# Patient Record
Sex: Male | Born: 1975 | Race: White | Hispanic: No | Marital: Single
Health system: Southern US, Academic
[De-identification: ages and names within clinical notes are randomized; demographics above are authoritative.]

## PROBLEM LIST (undated history)

## (undated) ENCOUNTER — Encounter: Attending: Pharmacist | Primary: Pharmacist

## (undated) ENCOUNTER — Encounter: Attending: Cardiovascular Disease | Primary: Cardiovascular Disease

## (undated) ENCOUNTER — Ambulatory Visit

## (undated) ENCOUNTER — Encounter: Attending: Hematology & Oncology | Primary: Hematology & Oncology

## (undated) ENCOUNTER — Telehealth

## (undated) ENCOUNTER — Ambulatory Visit: Payer: PRIVATE HEALTH INSURANCE | Attending: Cardiovascular Disease | Primary: Cardiovascular Disease

## (undated) ENCOUNTER — Encounter

## (undated) ENCOUNTER — Encounter: Payer: PRIVATE HEALTH INSURANCE | Attending: Cardiovascular Disease | Primary: Cardiovascular Disease

## (undated) DIAGNOSIS — F419 Anxiety disorder, unspecified: Secondary | ICD-10-CM

## (undated) DIAGNOSIS — E785 Hyperlipidemia, unspecified: Secondary | ICD-10-CM

## (undated) DIAGNOSIS — Z8673 Personal history of transient ischemic attack (TIA), and cerebral infarction without residual deficits: Secondary | ICD-10-CM

## (undated) DIAGNOSIS — I639 Cerebral infarction, unspecified: Secondary | ICD-10-CM

## (undated) HISTORY — DX: Personal history of transient ischemic attack (TIA), and cerebral infarction without residual deficits: Z86.73

---

## 2006-11-08 ENCOUNTER — Emergency Department (HOSPITAL_COMMUNITY): Admission: EM | Admit: 2006-11-08 | Discharge: 2006-11-08 | Payer: Self-pay | Admitting: Family Medicine

## 2008-11-02 ENCOUNTER — Emergency Department (HOSPITAL_COMMUNITY): Admission: EM | Admit: 2008-11-02 | Discharge: 2008-11-02 | Payer: Self-pay | Admitting: Family Medicine

## 2010-01-01 ENCOUNTER — Emergency Department (HOSPITAL_COMMUNITY): Admission: EM | Admit: 2010-01-01 | Discharge: 2010-01-01 | Payer: Self-pay | Admitting: Family Medicine

## 2012-02-26 ENCOUNTER — Encounter (HOSPITAL_COMMUNITY): Payer: Self-pay | Admitting: *Deleted

## 2012-02-26 ENCOUNTER — Emergency Department (INDEPENDENT_AMBULATORY_CARE_PROVIDER_SITE_OTHER)
Admission: EM | Admit: 2012-02-26 | Discharge: 2012-02-26 | Disposition: A | Discharge status: Home / Self Care | Payer: BC Managed Care – PPO | Source: Home / Self Care

## 2012-02-26 DIAGNOSIS — R0789 Other chest pain: Secondary | ICD-10-CM

## 2012-02-26 DIAGNOSIS — R071 Chest pain on breathing: Secondary | ICD-10-CM

## 2012-02-26 MED ORDER — NAPROXEN 500 MG PO TABS: 500.0000 mg | ORAL_TABLET | Freq: Two times a day (BID) | ORAL | Status: DC

## 2012-02-26 NOTE — ED Provider Notes (Signed)
Medical screening examination/treatment/procedure(s) were performed by non-physician practitioner and as supervising physician I was immediately available for consultation/collaboration.  Tallie Hevia, M.D.   Angenette Daily C Sharine Cadle, MD 02/26/12 2223 

## 2012-02-26 NOTE — ED Notes (Signed)
REPORTED  OFF  TO  Rosalita Chessman

## 2012-02-26 NOTE — ED Notes (Signed)
Speaking  In  Complete  sentances   Pulse  Ox  100       Pulse  Is  84

## 2012-02-26 NOTE — ED Notes (Signed)
Pt  Reports      A  Burning        Sensation     In  Chest       When  He  Takes  A  Deep  Breath        -  Reports  He  Was   Seen  About  1  Week  Ago  By  pcp    Was  Diagnosed with    Upper  resp        Took  No   meds        Recently  On  Camping      Trip      Continues  To  Have       Similar  Symptoms      Got  Worse  Yesterday     Seen   By  Paramedic   Yesterday            Now      Is  Awake  And  Alert  And  Oriented       Skin       Is      Warm    And  Dry

## 2012-02-26 NOTE — ED Provider Notes (Signed)
History     CSN: [id number]  Arrival date & time 02/26/12  1627   None     Chief Complaint  Patient presents with  . Shortness of Breath    (Consider location/radiation/quality/duration/timing/severity/associated sxs/prior treatment) Patient is a 36 y.o. male presenting with chest pain. The history is provided by the patient.  Chest Pain The chest pain began 1 - 2 weeks ago. Chest pain occurs intermittently. The chest pain is unchanged. Associated with: preceded by uri symptoms. At its most intense, the pain is at 6/10. The pain is currently at 0/10. The severity of the pain is moderate. The quality of the pain is described as burning. The pain does not radiate. Chest pain is worsened by deep breathing. Primary symptoms include shortness of breath. He tried nothing for the symptoms. There are no known risk factors.     History reviewed. No pertinent past medical history.  History reviewed. No pertinent past surgical history.  History reviewed. No pertinent family history.  History  Substance Use Topics  . Smoking status: Current Some Day Smoker  . Smokeless tobacco: Not on file  . Alcohol Use: Yes     Comment: occasonal      Review of Systems  Respiratory: Positive for shortness of breath.   Cardiovascular: Positive for chest pain.    Allergies  Penicillins  Home Medications   Current Outpatient Rx  Name  Route  Sig  Dispense  Refill  . NAPROXEN 500 MG PO TABS   Oral   Take 1 tablet (500 mg total) by mouth 2 (two) times daily.   30 tablet   0     BP 122/82  Pulse 74  Temp 98.6 F (37 C) (Oral)  Resp 18  SpO2 100%  Physical Exam  Nursing note and vitals reviewed. Constitutional: He is oriented to person, place, and time. Vital signs are normal. He appears well-developed and well-nourished. He is active and cooperative.  HENT:  Head: Normocephalic.  Eyes: Conjunctivae normal are normal. Pupils are equal, round, and reactive to light. No scleral  icterus.  Neck: Trachea normal. Neck supple.  Cardiovascular: Normal rate and regular rhythm.   Pulmonary/Chest: Effort normal and breath sounds normal.  Musculoskeletal: Normal range of motion.       Tenderness on right lower costal margin  Neurological: He is alert and oriented to person, place, and time. No cranial nerve deficit or sensory deficit.  Skin: Skin is warm and dry.  Psychiatric: He has a normal mood and affect. His speech is normal and behavior is normal. Judgment and thought content normal. Cognition and memory are normal.    ED Course  Procedures (including critical care time)  Labs Reviewed - No data to display No results found.   1. Costochondral chest pain       MDM  nsaids        Johnsie Kindred, NP 02/26/12 1914

## 2012-04-27 ENCOUNTER — Emergency Department (HOSPITAL_COMMUNITY): Payer: BC Managed Care – PPO

## 2012-04-27 ENCOUNTER — Emergency Department (HOSPITAL_COMMUNITY)
Admission: EM | Admit: 2012-04-27 | Discharge: 2012-04-27 | Discharge status: Against Medical Advice | Payer: BC Managed Care – PPO | Attending: Emergency Medicine | Admitting: Emergency Medicine

## 2012-04-27 ENCOUNTER — Encounter (HOSPITAL_COMMUNITY): Payer: Self-pay | Admitting: Emergency Medicine

## 2012-04-27 DIAGNOSIS — R0602 Shortness of breath: Secondary | ICD-10-CM | POA: Insufficient documentation

## 2012-04-27 DIAGNOSIS — F419 Anxiety disorder, unspecified: Secondary | ICD-10-CM | POA: Insufficient documentation

## 2012-04-27 HISTORY — DX: Anxiety disorder, unspecified: F41.9

## 2012-04-27 LAB — BASIC METABOLIC PANEL
GFR calc Af Amer: >90 mL/min (ref >90)
GFR calc non Af Amer: >90 mL/min (ref >90)
Glucose, Bld: 103 mg/dL — ABNORMAL HIGH (ref 70–99)
Potassium: 4 mEq/L (ref 3.5–5.1)
Sodium: 140 mEq/L (ref 135–145)

## 2012-04-27 LAB — CBC WITH DIFFERENTIAL/PLATELET
Basophils Absolute: 0 10*3/uL (ref 0.0–0.1)
Basophils Relative: 1 % (ref 0–1)
Eosinophils Absolute: 0.1 10*3/uL (ref 0.0–0.7)
Lymphs Abs: 1.8 10*3/uL (ref 0.7–4.0)
MCH: 32.1 pg (ref 26.0–34.0)
Neutrophils Relative %: 49 % (ref 43–77)
Platelets: 194 10*3/uL (ref 150–400)
RBC: 4.77 MIL/uL (ref 4.22–5.81)
WBC: 4.7 10*3/uL (ref 4.0–10.5)

## 2012-04-27 LAB — POCT I-STAT TROPONIN I: Troponin i, poc: 0 ng/mL (ref 0.00–0.08)

## 2012-04-27 NOTE — ED Notes (Signed)
Pt c/o episodes of SOB and chest pressure that pt thinks is anxiety and he is being seen for and given rx for xanax which does help; pt sts had episode today at work with shaking; pt here for eval to make sure nothing medical going on; pt had chest xray and CT angio chest that were normal in November upon onset

## 2012-04-27 NOTE — ED Notes (Signed)
The pt is tired of waiting he will see his doctor tomorrow.  He wanted the results of his tests  Unable to giv3e but his doctor can .

## 2012-08-25 ENCOUNTER — Other Ambulatory Visit: Payer: Self-pay | Admitting: Internal Medicine

## 2012-08-25 DIAGNOSIS — R1011 Right upper quadrant pain: Secondary | ICD-10-CM

## 2012-08-26 ENCOUNTER — Other Ambulatory Visit: Payer: BC Managed Care – PPO

## 2012-09-04 ENCOUNTER — Other Ambulatory Visit: Payer: Self-pay | Admitting: Internal Medicine

## 2012-09-04 ENCOUNTER — Ambulatory Visit
Admission: RE | Admit: 2012-09-04 | Discharge: 2012-09-04 | Disposition: A | Discharge status: Home / Self Care | Payer: BC Managed Care – PPO | Source: Ambulatory Visit | Attending: Internal Medicine | Admitting: Internal Medicine

## 2012-09-04 DIAGNOSIS — R1011 Right upper quadrant pain: Secondary | ICD-10-CM

## 2013-06-28 ENCOUNTER — Other Ambulatory Visit: Payer: Self-pay | Admitting: Sports Medicine

## 2013-06-28 DIAGNOSIS — M545 Low back pain, unspecified: Secondary | ICD-10-CM

## 2013-07-03 ENCOUNTER — Other Ambulatory Visit: Payer: BC Managed Care – PPO

## 2013-07-03 ENCOUNTER — Ambulatory Visit
Admission: RE | Admit: 2013-07-03 | Discharge: 2013-07-03 | Disposition: A | Discharge status: Home / Self Care | Payer: BC Managed Care – PPO | Source: Ambulatory Visit | Attending: Sports Medicine | Admitting: Sports Medicine

## 2013-07-03 DIAGNOSIS — M545 Low back pain, unspecified: Secondary | ICD-10-CM

## 2014-03-19 ENCOUNTER — Encounter (HOSPITAL_COMMUNITY): Payer: Self-pay | Admitting: *Deleted

## 2014-03-19 ENCOUNTER — Emergency Department (HOSPITAL_COMMUNITY): Payer: BC Managed Care – PPO

## 2014-03-19 ENCOUNTER — Emergency Department (HOSPITAL_COMMUNITY)
Admission: EM | Admit: 2014-03-19 | Discharge: 2014-03-19 | Disposition: A | Discharge status: Home / Self Care | Payer: BC Managed Care – PPO | Attending: Emergency Medicine | Admitting: Emergency Medicine

## 2014-03-19 DIAGNOSIS — Z791 Long term (current) use of non-steroidal anti-inflammatories (NSAID): Secondary | ICD-10-CM | POA: Diagnosis not present

## 2014-03-19 DIAGNOSIS — R000 Tachycardia, unspecified: Secondary | ICD-10-CM | POA: Diagnosis not present

## 2014-03-19 DIAGNOSIS — Z8659 Personal history of other mental and behavioral disorders: Secondary | ICD-10-CM | POA: Diagnosis not present

## 2014-03-19 DIAGNOSIS — Z880 Allergy status to penicillin: Secondary | ICD-10-CM | POA: Diagnosis not present

## 2014-03-19 DIAGNOSIS — J111 Influenza due to unidentified influenza virus with other respiratory manifestations: Secondary | ICD-10-CM | POA: Diagnosis not present

## 2014-03-19 DIAGNOSIS — R059 Cough, unspecified: Secondary | ICD-10-CM

## 2014-03-19 DIAGNOSIS — R1084 Generalized abdominal pain: Secondary | ICD-10-CM | POA: Insufficient documentation

## 2014-03-19 DIAGNOSIS — Z720 Tobacco use: Secondary | ICD-10-CM | POA: Insufficient documentation

## 2014-03-19 DIAGNOSIS — R509 Fever, unspecified: Secondary | ICD-10-CM

## 2014-03-19 DIAGNOSIS — R05 Cough: Secondary | ICD-10-CM | POA: Diagnosis present

## 2014-03-19 DIAGNOSIS — R69 Illness, unspecified: Secondary | ICD-10-CM

## 2014-03-19 MED ORDER — ALBUTEROL SULFATE HFA 108 (90 BASE) MCG/ACT IN AERS
2.0000 | INHALATION_SPRAY | RESPIRATORY_TRACT | Status: DC | PRN
  Administered 2014-03-19: 2 via RESPIRATORY_TRACT
  Filled 2014-03-19: qty 6.7

## 2014-03-19 MED ORDER — ALBUTEROL SULFATE (2.5 MG/3ML) 0.083% IN NEBU
5.0000 mg | INHALATION_SOLUTION | Freq: Once | RESPIRATORY_TRACT | Status: AC
  Administered 2014-03-19: 5 mg via RESPIRATORY_TRACT
  Filled 2014-03-19: qty 6

## 2014-03-19 MED ORDER — AEROCHAMBER PLUS W/MASK MISC
1.0000 | Freq: Once | Status: AC
  Administered 2014-03-19: 1
  Filled 2014-03-19: qty 1

## 2014-03-19 MED ORDER — ACETAMINOPHEN 500 MG PO TABS
1000.0000 mg | ORAL_TABLET | Freq: Once | ORAL | Status: AC
  Administered 2014-03-19: 1000 mg via ORAL
  Filled 2014-03-19: qty 2

## 2014-03-19 NOTE — ED Notes (Signed)
Pt in c/o cough and congestion for the last two weeks, had a fever today, ibuprofen approx 45 min ago

## 2014-03-19 NOTE — Discharge Instructions (Signed)
1. Medications: albuterol, usual home medications 2. Treatment: rest, drink plenty of fluids, mucinex, flonase, tylenol and ibuprofen, plenty of fluids 3. Follow Up: Please followup with your primary doctor in 2 days for discussion of your diagnoses and further evaluation after today's visit; if you do not have a primary care doctor use the resource guide provided to find one; Please return to the ER for worsening or localizing symptoms     Fever, Adult A fever is a higher than normal body temperature. In an adult, an oral temperature around 98.6 F (37 C) is considered normal. A temperature of 100.4 F (38 C) or higher is generally considered a fever. Mild or moderate fevers generally have no long-term effects and often do not require treatment. Extreme fever (greater than or equal to 106 F or 41.1 C) can cause seizures. The sweating that may occur with repeated or prolonged fever may cause dehydration. Elderly people can develop confusion during a fever. A measured temperature can vary with:  Age.  Time of day.  Method of measurement (mouth, underarm, rectal, or ear). The fever is confirmed by taking a temperature with a thermometer. Temperatures can be taken different ways. Some methods are accurate and some are not.  An oral temperature is used most commonly. Electronic thermometers are fast and accurate.  An ear temperature will only be accurate if the thermometer is positioned as recommended by the manufacturer.  A rectal temperature is accurate and done for those adults who have a condition where an oral temperature cannot be taken.  An underarm (axillary) temperature is not accurate and not recommended. Fever is a symptom, not a disease.  CAUSES   Infections commonly cause fever.  Some noninfectious causes for fever include:  Some arthritis conditions.  Some thyroid or adrenal gland conditions.  Some immune system conditions.  Some types of cancer.  A medicine  reaction.  High doses of certain street drugs such as methamphetamine.  Dehydration.  Exposure to high outside or room temperatures.  Occasionally, the source of a fever cannot be determined. This is sometimes called a "fever of unknown origin" (FUO).  Some situations may lead to a temporary rise in body temperature that may go away on its own. Examples are:  Childbirth.  Surgery.  Intense exercise. HOME CARE INSTRUCTIONS   Take appropriate medicines for fever. Follow dosing instructions carefully. If you use acetaminophen to reduce the fever, be careful to avoid taking other medicines that also contain acetaminophen. Do not take aspirin for a fever if you are younger than age 70. There is an association with Reye's syndrome. Reye's syndrome is a rare but potentially deadly disease.  If an infection is present and antibiotics have been prescribed, take them as directed. Finish them even if you start to feel better.  Rest as needed.  Maintain an adequate fluid intake. To prevent dehydration during an illness with prolonged or recurrent fever, you may need to drink extra fluid.Drink enough fluids to keep your urine clear or pale yellow.  Sponging or bathing with room temperature water may help reduce body temperature. Do not use ice water or alcohol sponge baths.  Dress comfortably, but do not over-bundle. SEEK MEDICAL CARE IF:   You are unable to keep fluids down.  You develop vomiting or diarrhea.  You are not feeling at least partly better after 3 days.  You develop new symptoms or problems. SEEK IMMEDIATE MEDICAL CARE IF:   You have shortness of breath or trouble breathing.  You develop excessive weakness.  You are dizzy or you faint.  You are extremely thirsty or you are making little or no urine.  You develop new pain that was not there before (such as in the head, neck, chest, back, or abdomen).  You have persistent vomiting and diarrhea for more than 1 to 2  days.  You develop a stiff neck or your eyes become sensitive to light.  You develop a skin rash.  You have a fever or persistent symptoms for more than 2 to 3 days.  You have a fever and your symptoms suddenly get worse. MAKE SURE YOU:   Understand these instructions.  Will watch your condition.  Will get help right away if you are not doing well or get worse. Document Released: 10/02/2000 Document Revised: 08/23/2013 Document Reviewed: 02/07/2011 [facility] Patient Information 2015 [place], [place]. This information is not intended to replace advice given to you by your health care provider. Make sure you discuss any questions you have with your health care provider.   Influenza Influenza ("the flu") is a viral infection of the respiratory tract. It occurs more often in winter months because people spend more time in close contact with one another. Influenza can make you feel very sick. Influenza easily spreads from person to person (contagious). CAUSES  Influenza is caused by a virus that infects the respiratory tract. You can catch the virus by breathing in droplets from an infected person's cough or sneeze. You can also catch the virus by touching something that was recently contaminated with the virus and then touching your mouth, nose, or eyes. RISKS AND COMPLICATIONS You may be at risk for a more severe case of influenza if you smoke cigarettes, have diabetes, have chronic heart disease (such as heart failure) or lung disease (such as asthma), or if you have a weakened immune system. Elderly people and pregnant women are also at risk for more serious infections. The most common problem of influenza is a lung infection (pneumonia). Sometimes, this problem can require emergency medical care and may be life threatening. SIGNS AND SYMPTOMS  Symptoms typically last 4 to 10 days and may include:  Fever.  Chills.  Headache, body aches, and muscle aches.  Sore throat.  Chest  discomfort and cough.  Poor appetite.  Weakness or feeling tired.  Dizziness.  Nausea or vomiting. DIAGNOSIS  Diagnosis of influenza is often made based on your history and a physical exam. A nose or throat swab test can be done to confirm the diagnosis. TREATMENT  In mild cases, influenza goes away on its own. Treatment is directed at relieving symptoms. For more severe cases, your health care provider may prescribe antiviral medicines to shorten the sickness. Antibiotic medicines are not effective because the infection is caused by a virus, not by bacteria. HOME CARE INSTRUCTIONS  Take medicines only as directed by your health care provider.  Use a cool mist humidifier to make breathing easier.  Get plenty of rest until your temperature returns to normal. This usually takes 3 to 4 days.  Drink enough fluid to keep your urine clear or pale yellow.  Cover yourmouth and nosewhen coughing or sneezing,and wash your handswellto prevent thevirusfrom spreading.  Stay homefromwork orschool untilthe fever is gonefor at least 321full day. PREVENTION  An annual influenza vaccination (flu shot) is the best way to avoid getting influenza. An annual flu shot is now routinely recommended for all adults in the [place] SEEK MEDICAL CARE IF:  You experiencechest pain,  yourcough worsens,or you producemore mucus.  Youhave nausea,vomiting, ordiarrhea.  Your fever returns or gets worse. SEEK IMMEDIATE MEDICAL CARE IF:  You havetrouble breathing, you become short of breath,or your skin ornails becomebluish.  You have severe painor stiffnessin the neck.  You develop a sudden headache, or pain in the face or ear.  You have nausea or vomiting that you cannot control. MAKE SURE YOU:   Understand these instructions.  Will watch your condition.  Will get help right away if you are not doing well or get worse. Document Released: 04/05/2000 Document Revised: 08/23/2013  Document Reviewed: 07/08/2011 [facility] Patient Information 2015 [place], [place]. This information is not intended to replace advice given to you by your health care provider. Make sure you discuss any questions you have with your health care provider.

## 2014-03-19 NOTE — ED Notes (Signed)
Instructed on use of the inhaler and aero chamber.  Reviewed discharge instructions, voiced understanding

## 2014-03-19 NOTE — ED Provider Notes (Signed)
CSN: [id number]     Arrival date & time 03/19/14  2036 History  This chart was scribed for non-physician practitioner, Dierdre ForthHannah Chadd Tollison, PA-C,working with Mirian MoMatthew Gentry, MD, by Karle PlumberJennifer Tensley, ED Scribe. This patient was seen in room TR08C/TR08C and the patient's care was started at 9:18 PM.  Chief Complaint  Patient presents with  . Cough   Patient is a 38 y.o. male presenting with cough. The history is provided by the patient. No language interpreter was used.  Cough Associated symptoms: fever and rhinorrhea   Associated symptoms: no chest pain, no chills, no ear pain, no headaches, no myalgias, no rash, no shortness of breath, no sore throat and no wheezing     HPI Comments:  Jake Moore is a 38 y.o. male who presents to the Emergency Department complaining of fever TMax 103 degrees onset several hours ago. He states [facility] and was instructed to present to the ED for a flu screening. Pt reports he suddenly started feeling as if he had a fever, labored breathing, mild generalized abdominal discomfort and dizziness. He reports he has had some rhinorrhea, cough, nasal congestion and post nasal drip within the past week. Pt reports that he may have became anxious when he saw a high temperature and began to hyperventilate mildly. He states he took Ibuprofen about 1.5 hours ago PTA. He denies any modifying factors. Denies nausea, vomiting, diarrhea, sore throat, otalgia. PMH of anxiety.  Past Medical History  Diagnosis Date  . Anxiety    History reviewed. No pertinent past surgical history. History reviewed. No pertinent family history. History  Substance Use Topics  . Smoking status: Current Some Day Smoker  . Smokeless tobacco: Not on file  . Alcohol Use: Yes     Comment: occasonal    Review of Systems  Constitutional: Positive for fever and fatigue. Negative for chills and appetite change.  HENT: Positive for congestion, postnasal  drip, rhinorrhea and sinus pressure. Negative for ear discharge, ear pain, mouth sores and sore throat.   Eyes: Negative for visual disturbance.  Respiratory: Positive for cough. Negative for chest tightness, shortness of breath, wheezing and stridor.   Cardiovascular: Negative for chest pain, palpitations and leg swelling.  Gastrointestinal: Positive for abdominal pain. Negative for nausea, vomiting and diarrhea.  Genitourinary: Negative for dysuria, urgency, frequency and hematuria.  Musculoskeletal: Negative for myalgias, back pain, arthralgias and neck stiffness.  Skin: Negative for rash.  Neurological: Negative for syncope, light-headedness, numbness and headaches.  Hematological: Negative for adenopathy.  Psychiatric/Behavioral: The patient is not nervous/anxious.   All other systems reviewed and are negative.   Allergies  Penicillins  Home Medications   Prior to Admission medications   Medication Sig Start Date End Date Taking? Authorizing Provider  naproxen (NAPROSYN) 500 MG tablet Take 1 tablet (500 mg total) by mouth 2 (two) times daily. 02/26/12   Johnsie Kindredarmen L Chatten, NP   Triage Vitals: BP 118/79 mmHg  Pulse 117  Temp(Src) 99.3 F (37.4 C) (Oral)  Resp 18  Ht 5\' 10"  (1.778 m)  Wt 160 lb (72.576 kg)  BMI 22.96 kg/m2  SpO2 97% Physical Exam  Constitutional: He is oriented to person, place, and time. He appears well-developed and well-nourished. No distress.  HENT:  Head: Normocephalic and atraumatic.  Right Ear: Tympanic membrane, external ear and ear canal normal.  Left Ear: Tympanic membrane, external ear and ear canal normal.  Nose: Mucosal edema and rhinorrhea present. No epistaxis. Right  sinus exhibits no maxillary sinus tenderness and no frontal sinus tenderness. Left sinus exhibits no maxillary sinus tenderness and no frontal sinus tenderness.  Mouth/Throat: Uvula is midline and mucous membranes are normal. Mucous membranes are not pale and not cyanotic. No  oropharyngeal exudate, posterior oropharyngeal edema, posterior oropharyngeal erythema or tonsillar abscesses.  Eyes: Conjunctivae are normal. Pupils are equal, round, and reactive to light.  Neck: Normal range of motion and full passive range of motion without pain.  Cardiovascular: Regular rhythm, normal heart sounds and intact distal pulses.   tachycardia  Pulmonary/Chest: Effort normal and breath sounds normal. No stridor. No respiratory distress. He has no wheezes. He has no rales. He exhibits no tenderness.  Course and equal breath sounds throughout without focal wheezes, rhonchi, rales  Abdominal: Soft. Bowel sounds are normal. He exhibits no distension. There is no tenderness.  Abd soft and nontender  Musculoskeletal: Normal range of motion.  Lymphadenopathy:    He has no cervical adenopathy.  Neurological: He is alert and oriented to person, place, and time.  Skin: Skin is warm and dry. No rash noted. He is not diaphoretic.  Psychiatric: He has a normal mood and affect.  Nursing note and vitals reviewed.   ED Course  Procedures (including critical care time) DIAGNOSTIC STUDIES: Oxygen Saturation is 97% on RA, normal by my interpretation.   COORDINATION OF CARE: 9:26 PM- Will order CXR and rectal temperature. Pt verbalizes understanding and agrees to plan.  Medications  albuterol (PROVENTIL HFA;VENTOLIN HFA) 108 (90 BASE) MCG/ACT inhaler 2 puff (not administered)  aerochamber plus with mask device 1 each (not administered)  albuterol (PROVENTIL) (2.5 MG/3ML) 0.083% nebulizer solution 5 mg (5 mg Nebulization Given 03/19/14 2211)  acetaminophen (TYLENOL) tablet 1,000 mg (1,000 mg Oral Given 03/19/14 2210)    Labs Review Labs Reviewed - No data to display  Imaging Review Dg Chest 2 View  03/19/2014   CLINICAL DATA:  38 year old male with 2 week history of cough and shortness of breath  EXAM: CHEST  2 VIEW  COMPARISON:  Prior chest x-ray 04/27/2012  FINDINGS: The lungs are  clear and negative for focal airspace consolidation, pulmonary edema or suspicious pulmonary nodule. No pleural effusion or pneumothorax. Cardiac and mediastinal contours are within normal limits. No acute fracture or lytic or blastic osseous lesions. The visualized upper abdominal bowel gas pattern is unremarkable.  IMPRESSION: Negative chest x-ray   Electronically Signed   By: Malachy MoanHeath  McCullough M.D.   On: 03/19/2014 21:47     EKG Interpretation None      MDM   Final diagnoses:  Cough  Influenza-like illness  Fever, unspecified fever cause   Jake Moore presents with fever and influenza like illness.  Vitals are stable, with tachycardia and fever.  No signs of dehydration, tolerating PO's.  Lungs are clear. CXR ordered due to URI symptoms prior to development of fever.    CXR without evidence of PNA.  Pt with improved lung sounds and decreased coughing after albuterol treatment.  Pt remains febrile, Tylenol given.  Discussed the cost versus benefit of Tamiflu treatment with the patient.  The patient understands that symptoms are within the recommended 24-48 hour window of treatment; but that Tamiflu will no alleviate the symptoms.    Patient will be discharged with instructions to orally hydrate, rest, and use over-the-counter medications such as anti-inflammatories ibuprofen and Aleve for muscle aches and Tylenol for fever.  Patient will also be given an albuterol inhaler.  I have personally  reviewed patient's vitals, nursing note and any pertinent labs or imaging.  I performed an focused physical exam; undressed when appropriate .    It has been determined that no acute conditions requiring further emergency intervention are present at this time. The patient/guardian have been advised of the diagnosis and plan. I reviewed any labs and imaging including any potential incidental findings. We have discussed signs and symptoms that warrant return to the ED and they are listed in the  discharge instructions.    Vital signs are stable at discharge.   BP 118/79 mmHg  Pulse 117  Temp(Src) 102.1 F (38.9 C) (Rectal)  Resp 18  Ht 5\' 10"  (1.778 m)  Wt 160 lb (72.576 kg)  BMI 22.96 kg/m2  SpO2 97%  I personally performed the services described in this documentation, which was scribed in my presence. The recorded information has been reviewed and is accurate.    Dahlia Client Mylz Yuan, PA-C 03/19/14 2254  Mirian Mo, MD 03/24/14 [phone]

## 2015-05-17 ENCOUNTER — Other Ambulatory Visit: Payer: Self-pay | Admitting: Internal Medicine

## 2015-05-17 DIAGNOSIS — R1011 Right upper quadrant pain: Secondary | ICD-10-CM

## 2015-05-24 ENCOUNTER — Ambulatory Visit
Admission: RE | Admit: 2015-05-24 | Discharge: 2015-05-24 | Disposition: A | Discharge status: Home / Self Care | Payer: Self-pay | Source: Ambulatory Visit | Attending: Internal Medicine | Admitting: Internal Medicine

## 2015-05-24 DIAGNOSIS — R1011 Right upper quadrant pain: Secondary | ICD-10-CM

## 2015-08-15 DIAGNOSIS — F4322 Adjustment disorder with anxiety: Secondary | ICD-10-CM | POA: Diagnosis not present

## 2015-08-21 DIAGNOSIS — F4322 Adjustment disorder with anxiety: Secondary | ICD-10-CM | POA: Diagnosis not present

## 2015-09-01 DIAGNOSIS — F900 Attention-deficit hyperactivity disorder, predominantly inattentive type: Secondary | ICD-10-CM | POA: Diagnosis not present

## 2015-09-01 DIAGNOSIS — F411 Generalized anxiety disorder: Secondary | ICD-10-CM | POA: Diagnosis not present

## 2015-09-21 DIAGNOSIS — F4322 Adjustment disorder with anxiety: Secondary | ICD-10-CM | POA: Diagnosis not present

## 2015-11-02 DIAGNOSIS — F4322 Adjustment disorder with anxiety: Secondary | ICD-10-CM | POA: Diagnosis not present

## 2016-01-25 DIAGNOSIS — Z0000 Encounter for general adult medical examination without abnormal findings: Secondary | ICD-10-CM | POA: Diagnosis not present

## 2016-02-01 DIAGNOSIS — Z1389 Encounter for screening for other disorder: Secondary | ICD-10-CM | POA: Diagnosis not present

## 2016-02-01 DIAGNOSIS — Z23 Encounter for immunization: Secondary | ICD-10-CM | POA: Diagnosis not present

## 2016-02-01 DIAGNOSIS — Z0000 Encounter for general adult medical examination without abnormal findings: Secondary | ICD-10-CM | POA: Diagnosis not present

## 2016-03-07 DIAGNOSIS — F4322 Adjustment disorder with anxiety: Secondary | ICD-10-CM | POA: Diagnosis not present

## 2016-05-01 DIAGNOSIS — F4322 Adjustment disorder with anxiety: Secondary | ICD-10-CM | POA: Diagnosis not present

## 2016-05-30 DIAGNOSIS — F4322 Adjustment disorder with anxiety: Secondary | ICD-10-CM | POA: Diagnosis not present

## 2016-06-24 DIAGNOSIS — F411 Generalized anxiety disorder: Secondary | ICD-10-CM | POA: Diagnosis not present

## 2016-06-24 DIAGNOSIS — R0789 Other chest pain: Secondary | ICD-10-CM | POA: Diagnosis not present

## 2016-06-24 DIAGNOSIS — Z8249 Family history of ischemic heart disease and other diseases of the circulatory system: Secondary | ICD-10-CM | POA: Diagnosis not present

## 2016-06-24 DIAGNOSIS — R030 Elevated blood-pressure reading, without diagnosis of hypertension: Secondary | ICD-10-CM | POA: Diagnosis not present

## 2016-07-02 ENCOUNTER — Other Ambulatory Visit: Payer: Self-pay | Admitting: Orthopedic Surgery

## 2016-07-02 DIAGNOSIS — M546 Pain in thoracic spine: Secondary | ICD-10-CM | POA: Diagnosis not present

## 2016-07-11 ENCOUNTER — Ambulatory Visit
Admission: RE | Admit: 2016-07-11 | Discharge: 2016-07-11 | Disposition: A | Discharge status: Home / Self Care | Payer: BLUE CROSS/BLUE SHIELD | Source: Ambulatory Visit | Attending: Orthopedic Surgery | Admitting: Orthopedic Surgery

## 2016-07-11 DIAGNOSIS — M47814 Spondylosis without myelopathy or radiculopathy, thoracic region: Secondary | ICD-10-CM | POA: Diagnosis not present

## 2016-07-11 DIAGNOSIS — M546 Pain in thoracic spine: Secondary | ICD-10-CM

## 2016-07-14 ENCOUNTER — Emergency Department (HOSPITAL_COMMUNITY)
Admission: EM | Admit: 2016-07-14 | Discharge: 2016-07-14 | Disposition: A | Discharge status: Home / Self Care | Payer: BLUE CROSS/BLUE SHIELD | Attending: Emergency Medicine | Admitting: Emergency Medicine

## 2016-07-14 ENCOUNTER — Encounter (HOSPITAL_COMMUNITY): Payer: Self-pay | Admitting: Emergency Medicine

## 2016-07-14 ENCOUNTER — Emergency Department (HOSPITAL_COMMUNITY): Payer: BLUE CROSS/BLUE SHIELD

## 2016-07-14 DIAGNOSIS — R079 Chest pain, unspecified: Secondary | ICD-10-CM

## 2016-07-14 DIAGNOSIS — Z87891 Personal history of nicotine dependence: Secondary | ICD-10-CM | POA: Insufficient documentation

## 2016-07-14 DIAGNOSIS — Z7982 Long term (current) use of aspirin: Secondary | ICD-10-CM | POA: Insufficient documentation

## 2016-07-14 DIAGNOSIS — R0789 Other chest pain: Secondary | ICD-10-CM | POA: Insufficient documentation

## 2016-07-14 DIAGNOSIS — Z79899 Other long term (current) drug therapy: Secondary | ICD-10-CM | POA: Insufficient documentation

## 2016-07-14 LAB — BASIC METABOLIC PANEL
Anion gap: 11 (ref 5–15)
BUN: 13 mg/dL (ref 6–20)
CALCIUM: 9.7 mg/dL (ref 8.9–10.3)
CO2: 27 mmol/L (ref 22–32)
Chloride: 104 mmol/L (ref 101–111)
Creatinine, Ser: 0.97 mg/dL (ref 0.61–1.24)
GFR calc Af Amer: >60 mL/min (ref >60)
Glucose, Bld: 101 mg/dL — ABNORMAL HIGH (ref 65–99)
POTASSIUM: 3.9 mmol/L (ref 3.5–5.1)
Sodium: 142 mmol/L (ref 135–145)

## 2016-07-14 LAB — CBC
HEMATOCRIT: 41.6 % (ref 39.0–52.0)
Hemoglobin: 13.8 g/dL (ref 13.0–17.0)
MCH: 30.4 pg (ref 26.0–34.0)
MCHC: 33.2 g/dL (ref 30.0–36.0)
MCV: 91.6 fL (ref 78.0–100.0)
Platelets: 185 10*3/uL (ref 150–400)
RBC: 4.54 MIL/uL (ref 4.22–5.81)
RDW: 13 % (ref 11.5–15.5)
WBC: 7.2 10*3/uL (ref 4.0–10.5)

## 2016-07-14 LAB — I-STAT TROPONIN, ED
Troponin i, poc: 0 ng/mL (ref 0.00–0.08)
Troponin i, poc: 0 ng/mL (ref 0.00–0.08)

## 2016-07-14 LAB — D-DIMER, QUANTITATIVE: D-Dimer, Quant: <0.27 ug/mL-FEU (ref 0.00–0.50)

## 2016-07-14 NOTE — ED Notes (Signed)
Phlebotomy at the bedside  

## 2016-07-14 NOTE — ED Notes (Signed)
Pt returned from X-ray.  

## 2016-07-14 NOTE — ED Notes (Signed)
Pt taken to Xray.

## 2016-07-14 NOTE — ED Provider Notes (Signed)
[facility]-EMERGENCY DEPT Provider Note   CSN: [id number] Arrival date & time: 07/14/16  1421     History   Chief Complaint Chief Complaint  Patient presents with  . Chest Pain    HPI Jake Moore is a 41 y.o. male.  HPI Complains of anterior chest pressure sudden onset nonradiating 2 hours prior to coming here. Discomfort is continuous. Not made better or worse by anything. Associated symptoms include lightheadedness and mild shortness of breath. No other associated symptoms. Has never had anything similar before. Of note patient exercises regularly and ran on elliptical machine yesterday for 30 minutes without any discomfort. No treatment prior to coming here. Nothing makes symptoms better or worse. No other associated symptoms Past Medical History:  Diagnosis Date  . Anxiety   ADD, hyperlipidemia, recently diagnosed with elevated blood pressure however did not started on medication  Patient Active Problem List   Diagnosis Date Noted  . Anxiety     History reviewed. No pertinent surgical history.     Home Medications    Prior to Admission medications   Medication Sig Start Date End Date Taking? Authorizing Provider  naproxen (NAPROSYN) 500 MG tablet Take 1 tablet (500 mg total) by mouth 2 (two) times daily. 02/26/12   Johnsie Kindredarmen L Chatten, NP    Family History History reviewed. No pertinent family history. Sister. had MI age 40 Social History Social History  Substance Use Topics  . Smoking status: Former Smoker    Years: 20.00    Types: Cigarettes    Quit date: 01/15/2016  . Smokeless tobacco: Former NeurosurgeonUser    Types: Chew  . Alcohol use 12.0 oz/week    20 Cans of beer per week     Comment: occasonal   Ex-smoker quit 6 months ago. No illicit drug use  Allergies   Penicillins   Review of Systems Review of Systems  Constitutional: Negative.   HENT: Negative.   Respiratory: Positive for shortness of breath.   Cardiovascular: Positive for chest pain.    Gastrointestinal: Negative.   Musculoskeletal: Negative.   Skin: Negative.   Neurological: Positive for light-headedness.  Psychiatric/Behavioral: Negative.   All other systems reviewed and are negative.    Physical Exam Updated Vital Signs BP (!) 131/97   Pulse 82   Temp 97.5 F (36.4 C) (Oral)   Resp (!) 21   Ht 5\' 10"  (1.778 m)   Wt 165 lb (74.8 kg)   SpO2 100%   BMI 23.68 kg/m   Physical Exam  Constitutional: He appears well-developed and well-nourished.  Appears mildly anxious  HENT:  Head: Normocephalic and atraumatic.  Eyes: Conjunctivae are normal. Pupils are equal, round, and reactive to light.  Neck: Neck supple. No tracheal deviation present. No thyromegaly present.  Cardiovascular: Normal rate, regular rhythm and intact distal pulses.   No murmur heard. Pulmonary/Chest: Effort normal and breath sounds normal.  Abdominal: Soft. Bowel sounds are normal. He exhibits no distension. There is no tenderness.  Musculoskeletal: Normal range of motion. He exhibits no edema or tenderness.  Neurological: He is alert. Coordination normal.  Skin: Skin is warm and dry. No rash noted.  Psychiatric: He has a normal mood and affect.  Nursing note and vitals reviewed.    ED Treatments / Results  Labs (all labs ordered are listed, but only abnormal results are displayed) Labs Reviewed  BASIC METABOLIC PANEL - Abnormal; Notable for the following:       Result Value   Glucose, Bld 101 (*)  All other components within normal limits  CBC  I-STAT TROPOININ, ED    EKG  EKG Interpretation  Date/Time:  Sunday July 14 2016 14:25:56 EDT Ventricular Rate:  100 PR Interval:  140 QRS Duration: 84 QT Interval:  358 QTC Calculation: 461 R Axis:   89 Text Interpretation:  Normal sinus rhythm Normal ECG No STEMI.  Confirmed by LONG MD, JOSHUA [phone]) on 07/14/2016 2:36:51 PM       Radiology Dg Chest 2 View  Result Date: 07/14/2016 CLINICAL DATA:  Patient left chest  pressure.  Lightheadedness. EXAM: CHEST  2 VIEW COMPARISON:  Chest radiograph 04/27/2012 FINDINGS: Normal cardiac mediastinal contours. No consolidative pulmonary opacities. No pleural effusion or pneumothorax. Regional skeleton is unremarkable. IMPRESSION: No active cardiopulmonary disease. Electronically Signed   By: Annia Belt M.D.   On: 07/14/2016 15:09    Procedures Procedures (including critical care time)  Medications Ordered in ED Medications - No data to display Chest x-ray viewed by me Results for orders placed or performed during the hospital encounter of 07/14/16  Basic metabolic panel  Result Value Ref Range   Sodium 142 135 - 145 mmol/L   Potassium 3.9 3.5 - 5.1 mmol/L   Chloride 104 101 - 111 mmol/L   CO2 27 22 - 32 mmol/L   Glucose, Bld 101 (H) 65 - 99 mg/dL   BUN 13 6 - 20 mg/dL   Creatinine, Ser 6.04 0.61 - 1.24 mg/dL   Calcium 9.7 8.9 - 54.0 mg/dL   GFR calc non Af Amer >60 >60 mL/min   GFR calc Af Amer >60 >60 mL/min   Anion gap 11 5 - 15  D-dimer, quantitative (not at [facility])  Result Value Ref Range   D-Dimer, Quant <0.27 0.00 - 0.50 ug/mL-FEU  CBC  Result Value Ref Range   WBC 7.2 4.0 - 10.5 K/uL   RBC 4.54 4.22 - 5.81 MIL/uL   Hemoglobin 13.8 13.0 - 17.0 g/dL   HCT 98.1 19.1 - 47.8 %   MCV 91.6 78.0 - 100.0 fL   MCH 30.4 26.0 - 34.0 pg   MCHC 33.2 30.0 - 36.0 g/dL   RDW 29.5 62.1 - 30.8 %   Platelets 185 150 - 400 K/uL  I-stat troponin, ED  Result Value Ref Range   Troponin i, poc 0.00 0.00 - 0.08 ng/mL   Comment 3          I-stat troponin, ED  Result Value Ref Range   Troponin i, poc 0.00 0.00 - 0.08 ng/mL   Comment 3           Dg Chest 2 View  Result Date: 07/14/2016 CLINICAL DATA:  Patient left chest pressure.  Lightheadedness. EXAM: CHEST  2 VIEW COMPARISON:  Chest radiograph 04/27/2012 FINDINGS: Normal cardiac mediastinal contours. No consolidative pulmonary opacities. No pleural effusion or pneumothorax. Regional skeleton is unremarkable.  IMPRESSION: No active cardiopulmonary disease. Electronically Signed   By: Annia Belt M.D.   On: 07/14/2016 15:09   Mr Thoracic Spine Wo Contrast  Result Date: 07/11/2016 CLINICAL DATA:  Thoracic back pain EXAM: MRI THORACIC SPINE WITHOUT CONTRAST TECHNIQUE: Multiplanar, multisequence MR imaging of the thoracic spine was performed. No intravenous contrast was administered. COMPARISON:  Chest two-view 03/19/2014 FINDINGS: Alignment:  Normal Vertebrae: Normal Cord:  Normal Paraspinal and other soft tissues: Negative Disc levels: Disc spaces well preserved throughout the thoracic spine. Small central disc protrusion at T8-9 without cord deformity or stenosis. Tiny central disc protrusions T5-6 and T6-7. Small  left-sided disc protrusion T9-10 IMPRESSION: Mild thoracic degenerative changes. Small central disc protrusions without stenosis or neural impingement. Electronically Signed   By: Marlan Palau M.D.   On: 07/11/2016 09:01    Initial Impression / Assessment and Plan / ED Course  I have reviewed the triage vital signs and the nursing notes.  Pertinent labs & imaging results that were available during my care of the patient were reviewed by me and considered in my medical decision making (see chart for details).     6:10 PM symptoms unchanged. However he feels okay to go home. I suspect there is a large component of anxiety to the patient's symptoms. Story highly atypical. Patient appears trim and does regular aerobic exercise without any chest discomfort. 2 negative troponins. Heart score equals 2. Patient has follow-up with cardiology service to get calcium score this week. Low pretest clinical suspicion for pulmonary embolus. Negative d-dimer.  Final Clinical Impressions(s) / ED Diagnoses  Diagnosis atypical chest pain Final diagnoses:  None    New Prescriptions New Prescriptions   No medications on file     Doug Sou, MD 07/14/16 1816

## 2016-07-14 NOTE — ED Triage Notes (Signed)
Pt from home with c/o left chest pressure starting approx 2 hours ago.  Pt reports lightheadedness and "feeling like my heart is racing."  Pt states he came because his sister had an MI just 6 months ago and was other wise healthy.  NAD, A&O, ambulatory.

## 2016-07-14 NOTE — Discharge Instructions (Signed)
Keep your scheduled appointment the cardiologist office. Return if concern for any reason

## 2016-07-16 ENCOUNTER — Telehealth (HOSPITAL_COMMUNITY): Payer: Self-pay | Admitting: *Deleted

## 2016-07-16 DIAGNOSIS — R079 Chest pain, unspecified: Secondary | ICD-10-CM

## 2016-07-16 NOTE — Telephone Encounter (Signed)
Per Dr Shirlee LatchMcLean, pt was in ER over weekend with CP and needs cardiac CT, order placed

## 2016-07-19 ENCOUNTER — Ambulatory Visit (HOSPITAL_COMMUNITY)
Admission: RE | Admit: 2016-07-19 | Discharge: 2016-07-19 | Disposition: A | Discharge status: Home / Self Care | Payer: BLUE CROSS/BLUE SHIELD | Source: Ambulatory Visit | Attending: Cardiology | Admitting: Cardiology

## 2016-07-19 DIAGNOSIS — R079 Chest pain, unspecified: Secondary | ICD-10-CM | POA: Insufficient documentation

## 2016-07-19 MED ORDER — METOPROLOL TARTRATE 5 MG/5ML IV SOLN
INTRAVENOUS | Status: AC
  Filled 2016-07-19: qty 15

## 2016-07-19 MED ORDER — IOPAMIDOL (ISOVUE-370) INJECTION 76%
INTRAVENOUS | Status: AC
  Administered 2016-07-19: 100 mL
  Filled 2016-07-19: qty 100

## 2016-07-19 MED ORDER — NITROGLYCERIN 0.4 MG SL SUBL
SUBLINGUAL_TABLET | SUBLINGUAL | Status: AC
  Filled 2016-07-19: qty 1

## 2016-07-19 MED ORDER — NITROGLYCERIN 0.4 MG SL SUBL: 0.4000 mg | SUBLINGUAL_TABLET | Freq: Once | SUBLINGUAL | Status: DC

## 2016-07-19 MED ORDER — METOPROLOL TARTRATE 5 MG/5ML IV SOLN
5.0000 mg | INTRAVENOUS | Status: DC | PRN
  Administered 2016-07-19: 5 mg via INTRAVENOUS

## 2016-08-06 DIAGNOSIS — F4322 Adjustment disorder with anxiety: Secondary | ICD-10-CM | POA: Diagnosis not present

## 2016-08-26 DIAGNOSIS — F900 Attention-deficit hyperactivity disorder, predominantly inattentive type: Secondary | ICD-10-CM | POA: Diagnosis not present

## 2016-09-11 DIAGNOSIS — F4322 Adjustment disorder with anxiety: Secondary | ICD-10-CM | POA: Diagnosis not present

## 2016-11-18 DIAGNOSIS — F4322 Adjustment disorder with anxiety: Secondary | ICD-10-CM | POA: Diagnosis not present

## 2016-11-22 DIAGNOSIS — F4322 Adjustment disorder with anxiety: Secondary | ICD-10-CM | POA: Diagnosis not present

## 2016-12-09 DIAGNOSIS — Z6823 Body mass index (BMI) 23.0-23.9, adult: Secondary | ICD-10-CM | POA: Diagnosis not present

## 2016-12-09 DIAGNOSIS — R779 Abnormality of plasma protein, unspecified: Secondary | ICD-10-CM | POA: Diagnosis not present

## 2016-12-09 DIAGNOSIS — R1013 Epigastric pain: Secondary | ICD-10-CM | POA: Diagnosis not present

## 2016-12-18 DIAGNOSIS — F4322 Adjustment disorder with anxiety: Secondary | ICD-10-CM | POA: Diagnosis not present

## 2017-01-23 DIAGNOSIS — F4322 Adjustment disorder with anxiety: Secondary | ICD-10-CM | POA: Diagnosis not present

## 2017-02-17 DIAGNOSIS — Z23 Encounter for immunization: Secondary | ICD-10-CM | POA: Diagnosis not present

## 2017-02-17 DIAGNOSIS — E785 Hyperlipidemia, unspecified: Secondary | ICD-10-CM | POA: Diagnosis not present

## 2017-02-17 DIAGNOSIS — Z0000 Encounter for general adult medical examination without abnormal findings: Secondary | ICD-10-CM | POA: Diagnosis not present

## 2017-02-17 DIAGNOSIS — F411 Generalized anxiety disorder: Secondary | ICD-10-CM | POA: Diagnosis not present

## 2017-02-17 DIAGNOSIS — Z1389 Encounter for screening for other disorder: Secondary | ICD-10-CM | POA: Diagnosis not present

## 2017-02-17 DIAGNOSIS — R030 Elevated blood-pressure reading, without diagnosis of hypertension: Secondary | ICD-10-CM | POA: Diagnosis not present

## 2017-02-17 DIAGNOSIS — Z8249 Family history of ischemic heart disease and other diseases of the circulatory system: Secondary | ICD-10-CM | POA: Diagnosis not present

## 2017-02-17 DIAGNOSIS — R779 Abnormality of plasma protein, unspecified: Secondary | ICD-10-CM | POA: Diagnosis not present

## 2017-03-04 DIAGNOSIS — F4322 Adjustment disorder with anxiety: Secondary | ICD-10-CM | POA: Diagnosis not present

## 2017-04-10 ENCOUNTER — Emergency Department (HOSPITAL_COMMUNITY): Payer: BLUE CROSS/BLUE SHIELD

## 2017-04-10 ENCOUNTER — Emergency Department (HOSPITAL_COMMUNITY)
Admission: EM | Admit: 2017-04-10 | Discharge: 2017-04-10 | Disposition: A | Discharge status: Home / Self Care | Payer: BLUE CROSS/BLUE SHIELD | Source: Home / Self Care | Attending: Emergency Medicine | Admitting: Emergency Medicine

## 2017-04-10 DIAGNOSIS — I63211 Cerebral infarction due to unspecified occlusion or stenosis of right vertebral arteries: Secondary | ICD-10-CM | POA: Diagnosis not present

## 2017-04-10 DIAGNOSIS — I63239 Cerebral infarction due to unspecified occlusion or stenosis of unspecified carotid arteries: Secondary | ICD-10-CM | POA: Diagnosis not present

## 2017-04-10 DIAGNOSIS — F419 Anxiety disorder, unspecified: Secondary | ICD-10-CM | POA: Diagnosis not present

## 2017-04-10 DIAGNOSIS — R740 Nonspecific elevation of levels of transaminase and lactic acid dehydrogenase [LDH]: Secondary | ICD-10-CM | POA: Diagnosis not present

## 2017-04-10 DIAGNOSIS — I639 Cerebral infarction, unspecified: Secondary | ICD-10-CM | POA: Diagnosis not present

## 2017-04-10 DIAGNOSIS — I69398 Other sequelae of cerebral infarction: Secondary | ICD-10-CM | POA: Diagnosis not present

## 2017-04-10 DIAGNOSIS — F329 Major depressive disorder, single episode, unspecified: Secondary | ICD-10-CM | POA: Diagnosis present

## 2017-04-10 DIAGNOSIS — R739 Hyperglycemia, unspecified: Secondary | ICD-10-CM | POA: Diagnosis not present

## 2017-04-10 DIAGNOSIS — R200 Anesthesia of skin: Secondary | ICD-10-CM | POA: Diagnosis present

## 2017-04-10 DIAGNOSIS — R51 Headache: Secondary | ICD-10-CM | POA: Diagnosis not present

## 2017-04-10 DIAGNOSIS — Z8481 Family history of carrier of genetic disease: Secondary | ICD-10-CM

## 2017-04-10 DIAGNOSIS — I253 Aneurysm of heart: Secondary | ICD-10-CM | POA: Diagnosis not present

## 2017-04-10 DIAGNOSIS — G463 Brain stem stroke syndrome: Secondary | ICD-10-CM | POA: Diagnosis not present

## 2017-04-10 DIAGNOSIS — R42 Dizziness and giddiness: Secondary | ICD-10-CM | POA: Insufficient documentation

## 2017-04-10 DIAGNOSIS — Z836 Family history of other diseases of the respiratory system: Secondary | ICD-10-CM | POA: Diagnosis not present

## 2017-04-10 DIAGNOSIS — I63111 Cerebral infarction due to embolism of right vertebral artery: Secondary | ICD-10-CM | POA: Diagnosis not present

## 2017-04-10 DIAGNOSIS — E785 Hyperlipidemia, unspecified: Secondary | ICD-10-CM | POA: Diagnosis not present

## 2017-04-10 DIAGNOSIS — R260 Ataxic gait: Secondary | ICD-10-CM | POA: Diagnosis not present

## 2017-04-10 DIAGNOSIS — E876 Hypokalemia: Secondary | ICD-10-CM | POA: Diagnosis not present

## 2017-04-10 DIAGNOSIS — H532 Diplopia: Secondary | ICD-10-CM | POA: Diagnosis not present

## 2017-04-10 DIAGNOSIS — Z791 Long term (current) use of non-steroidal anti-inflammatories (NSAID): Secondary | ICD-10-CM | POA: Diagnosis not present

## 2017-04-10 DIAGNOSIS — Z87891 Personal history of nicotine dependence: Secondary | ICD-10-CM

## 2017-04-10 DIAGNOSIS — I6509 Occlusion and stenosis of unspecified vertebral artery: Secondary | ICD-10-CM | POA: Diagnosis not present

## 2017-04-10 DIAGNOSIS — R209 Unspecified disturbances of skin sensation: Secondary | ICD-10-CM | POA: Diagnosis not present

## 2017-04-10 DIAGNOSIS — F988 Other specified behavioral and emotional disorders with onset usually occurring in childhood and adolescence: Secondary | ICD-10-CM | POA: Diagnosis not present

## 2017-04-10 DIAGNOSIS — I63011 Cerebral infarction due to thrombosis of right vertebral artery: Secondary | ICD-10-CM | POA: Diagnosis not present

## 2017-04-10 DIAGNOSIS — R29701 NIHSS score 1: Secondary | ICD-10-CM | POA: Diagnosis not present

## 2017-04-10 DIAGNOSIS — I63541 Cerebral infarction due to unspecified occlusion or stenosis of right cerebellar artery: Secondary | ICD-10-CM | POA: Diagnosis not present

## 2017-04-10 DIAGNOSIS — E7841 Elevated Lipoprotein(a): Secondary | ICD-10-CM | POA: Diagnosis present

## 2017-04-10 DIAGNOSIS — H5500 Unspecified nystagmus: Secondary | ICD-10-CM | POA: Diagnosis not present

## 2017-04-10 DIAGNOSIS — Z832 Family history of diseases of the blood and blood-forming organs and certain disorders involving the immune mechanism: Secondary | ICD-10-CM | POA: Diagnosis not present

## 2017-04-10 DIAGNOSIS — I69393 Ataxia following cerebral infarction: Secondary | ICD-10-CM | POA: Diagnosis not present

## 2017-04-10 DIAGNOSIS — K5900 Constipation, unspecified: Secondary | ICD-10-CM | POA: Diagnosis not present

## 2017-04-10 DIAGNOSIS — I6501 Occlusion and stenosis of right vertebral artery: Secondary | ICD-10-CM | POA: Diagnosis not present

## 2017-04-10 DIAGNOSIS — G464 Cerebellar stroke syndrome: Secondary | ICD-10-CM | POA: Diagnosis not present

## 2017-04-10 DIAGNOSIS — Z880 Allergy status to penicillin: Secondary | ICD-10-CM | POA: Diagnosis not present

## 2017-04-10 DIAGNOSIS — N179 Acute kidney failure, unspecified: Secondary | ICD-10-CM | POA: Diagnosis not present

## 2017-04-10 DIAGNOSIS — I63219 Cerebral infarction due to unspecified occlusion or stenosis of unspecified vertebral arteries: Secondary | ICD-10-CM | POA: Diagnosis not present

## 2017-04-10 DIAGNOSIS — M542 Cervicalgia: Secondary | ICD-10-CM | POA: Diagnosis present

## 2017-04-10 DIAGNOSIS — I6302 Cerebral infarction due to thrombosis of basilar artery: Secondary | ICD-10-CM | POA: Diagnosis not present

## 2017-04-10 DIAGNOSIS — I63012 Cerebral infarction due to thrombosis of left vertebral artery: Secondary | ICD-10-CM | POA: Diagnosis not present

## 2017-04-10 DIAGNOSIS — Z8249 Family history of ischemic heart disease and other diseases of the circulatory system: Secondary | ICD-10-CM | POA: Diagnosis not present

## 2017-04-10 DIAGNOSIS — I361 Nonrheumatic tricuspid (valve) insufficiency: Secondary | ICD-10-CM | POA: Diagnosis not present

## 2017-04-10 DIAGNOSIS — I10 Essential (primary) hypertension: Secondary | ICD-10-CM | POA: Diagnosis not present

## 2017-04-10 DIAGNOSIS — Z148 Genetic carrier of other disease: Secondary | ICD-10-CM | POA: Diagnosis not present

## 2017-04-10 DIAGNOSIS — Q211 Atrial septal defect: Secondary | ICD-10-CM | POA: Diagnosis not present

## 2017-04-10 DIAGNOSIS — Z79899 Other long term (current) drug therapy: Secondary | ICD-10-CM | POA: Diagnosis not present

## 2017-04-10 DIAGNOSIS — I6300 Cerebral infarction due to thrombosis of unspecified precerebral artery: Secondary | ICD-10-CM | POA: Diagnosis not present

## 2017-04-10 DIAGNOSIS — I081 Rheumatic disorders of both mitral and tricuspid valves: Secondary | ICD-10-CM | POA: Diagnosis not present

## 2017-04-10 DIAGNOSIS — F411 Generalized anxiety disorder: Secondary | ICD-10-CM | POA: Diagnosis not present

## 2017-04-10 LAB — BASIC METABOLIC PANEL
ANION GAP: 8 (ref 5–15)
BUN: 9 mg/dL (ref 6–20)
CO2: 26 mmol/L (ref 22–32)
Calcium: 9.4 mg/dL (ref 8.9–10.3)
Chloride: 106 mmol/L (ref 101–111)
Creatinine, Ser: 0.95 mg/dL (ref 0.61–1.24)
Glucose, Bld: 144 mg/dL — ABNORMAL HIGH (ref 65–99)
POTASSIUM: 4.4 mmol/L (ref 3.5–5.1)
SODIUM: 140 mmol/L (ref 135–145)

## 2017-04-10 LAB — CBC WITH DIFFERENTIAL/PLATELET
BASOS ABS: 0 10*3/uL (ref 0.0–0.1)
Basophils Relative: 0 %
EOS PCT: 1 %
Eosinophils Absolute: 0.1 10*3/uL (ref 0.0–0.7)
HCT: 44 % (ref 39.0–52.0)
HEMOGLOBIN: 15.4 g/dL (ref 13.0–17.0)
LYMPHS PCT: 20 %
Lymphs Abs: 1.2 10*3/uL (ref 0.7–4.0)
MCH: 31.6 pg (ref 26.0–34.0)
MCHC: 35 g/dL (ref 30.0–36.0)
MCV: 90.3 fL (ref 78.0–100.0)
Monocytes Absolute: 0.4 10*3/uL (ref 0.1–1.0)
Monocytes Relative: 7 %
NEUTROS PCT: 72 %
Neutro Abs: 4.4 10*3/uL (ref 1.7–7.7)
PLATELETS: 159 10*3/uL (ref 150–400)
RBC: 4.87 MIL/uL (ref 4.22–5.81)
RDW: 12.1 % (ref 11.5–15.5)
WBC: 6.1 10*3/uL (ref 4.0–10.5)

## 2017-04-10 LAB — I-STAT TROPONIN, ED: TROPONIN I, POC: 0 ng/mL (ref 0.00–0.08)

## 2017-04-10 MED ORDER — SODIUM CHLORIDE 0.9 % IV BOLUS (SEPSIS)
1000.0000 mL | Freq: Once | INTRAVENOUS | Status: AC
  Administered 2017-04-10: 1000 mL via INTRAVENOUS

## 2017-04-10 MED ORDER — MECLIZINE HCL 25 MG PO TABS: 25.0000 mg | ORAL_TABLET | Freq: Three times a day (TID) | ORAL | 0 refills | Status: DC | PRN

## 2017-04-10 MED ORDER — ONDANSETRON HCL 8 MG PO TABS: 8.0000 mg | ORAL_TABLET | Freq: Three times a day (TID) | ORAL | 0 refills | Status: DC | PRN

## 2017-04-10 MED ORDER — LORAZEPAM 2 MG/ML IJ SOLN
1.0000 mg | Freq: Once | INTRAMUSCULAR | Status: AC
  Administered 2017-04-10: 1 mg via INTRAVENOUS
  Filled 2017-04-10: qty 1

## 2017-04-10 MED ORDER — MECLIZINE HCL 25 MG PO TABS
25.0000 mg | ORAL_TABLET | Freq: Once | ORAL | Status: AC
  Administered 2017-04-10: 25 mg via ORAL
  Filled 2017-04-10: qty 1

## 2017-04-10 NOTE — ED Notes (Signed)
Pt vomiting prior to ativan. Pt appears less nauseated after ativan and able to move without nausea

## 2017-04-10 NOTE — Discharge Instructions (Signed)
Medication for nausea and dizziness.  Rest.

## 2017-04-10 NOTE — ED Triage Notes (Addendum)
Pt had a sudden onset of dizziness and headache at 0830 this morning laid down and got up this evening and was no better, pt had 3 episodes of vomiting en route to ED. hypertensive in triage with no history of. Pt taken to treatment room, MD at bedside.

## 2017-04-10 NOTE — ED Notes (Signed)
Pt ambulatory in room with no nausea

## 2017-04-10 NOTE — ED Notes (Signed)
Patient transported to CT 

## 2017-04-10 NOTE — ED Provider Notes (Signed)
[facility]   CSN: [id number] Arrival date & time: 04/10/17  1338     History   Chief Complaint Chief Complaint  Patient presents with  . Dizziness    HPI Jake Moore is a 41 y.o. male.  Patient is a 41 year old male with a history of anxiety but also a family history of significant genetic abnormalities resulting in early cardiac disease and liver disease who is presenting today with onset of a headache today and dizziness.  Patient states when he woke up this morning he had a bad right-sided headache which he took 2 ibuprofen 4.  Shortly afterwards he began feeling dizzy which he describes as off balance and lightheaded.  It caused him to fall and due to the dizziness, nausea, vomiting and lightheadedness he was not able to get back to the bed very well.  He took a nap as symptoms started at 830 this morning but when he woke up they were no better.  He is still having severe nausea and vomiting.  He states there is no blurry vision or loss of vision but it feels like there are things moving in his vision.  He denies any speech or swallowing difficulty.  He denies any unilateral numbness or weakness.  He has never had anything similar to this.  He initially said his hearing seemed a little bit strange but it has normalized.  He has had no recent medication changes in the last 60 days.   The history is provided by the patient and the spouse.    Past Medical History:  Diagnosis Date  . Anxiety     Patient Active Problem List   Diagnosis Date Noted  . Anxiety     No past surgical history on file.     Home Medications    Prior to Admission medications   Medication Sig Start Date End Date Taking? Authorizing Provider  aspirin 81 MG chewable tablet Chew 81 mg by mouth daily.    [provider]  atomoxetine (STRATTERA) 10 MG capsule Take 10 mg by mouth daily.    [provider]  naproxen (NAPROSYN)  500 MG tablet Take 1 tablet (500 mg total) by mouth 2 (two) times daily. Patient not taking: Reported on 07/14/2016 02/26/12   Johnsie Kindredhatten, Carmen L, NP  rosuvastatin (CRESTOR) 10 MG tablet Take 10 mg by mouth daily.    [provider]    Family History No family history on file.  Social History Social History   Tobacco Use  . Smoking status: Former Smoker    Years: 20.00    Types: Cigarettes    Last attempt to quit: 01/15/2016    Years since quitting: 1.2  . Smokeless tobacco: Former NeurosurgeonUser    Types: Chew  Substance Use Topics  . Alcohol use: Yes    Alcohol/week: 12.0 oz    Types: 20 Cans of beer per week    Comment: occasonal  . Drug use: No     Allergies   Penicillins   Review of Systems Review of Systems  All other systems reviewed and are negative.    Physical Exam Updated Vital Signs BP (!) 155/125 (BP Location: Right Arm)   Pulse (!) 106   Temp 98.1 F (36.7 C) (Oral)   Resp 18   SpO2 100%   Physical Exam  Constitutional: He is oriented to person, place, and time. He appears well-developed and well-nourished. No distress.  Pale and intermittently retching  HENT:  Head: Normocephalic and atraumatic.  Right Ear: Tympanic membrane normal.  Left Ear: Tympanic membrane normal.  Mouth/Throat: Oropharynx is clear and moist.  Eyes: Conjunctivae and EOM are normal. Pupils are equal, round, and reactive to light.  Slight nystagmus when looking to the right  Neck: Normal range of motion. Neck supple.  Cardiovascular: Normal rate, regular rhythm and intact distal pulses.  No murmur heard. Pulmonary/Chest: Effort normal and breath sounds normal. No respiratory distress. He has no wheezes. He has no rales.  Abdominal: Soft. He exhibits no distension. There is no tenderness. There is no rebound and no guarding.  Musculoskeletal: Normal range of motion. He exhibits no edema or tenderness.  Neurological: He is alert and oriented to person, place, and time. He has  normal strength. No cranial nerve deficit or sensory deficit.  No pronator drift.  Heel to shin normal bilaterally.  Did not ambulate at this time due to severe n/v and dizziness.  Skin: Skin is warm. No rash noted. He is diaphoretic. No erythema.  Psychiatric: He has a normal mood and affect. His behavior is normal.  Nursing note and vitals reviewed.    ED Treatments / Results  Labs (all labs ordered are listed, but only abnormal results are displayed) Labs Reviewed  BASIC METABOLIC PANEL - Abnormal; Notable for the following components:      Result Value   Glucose, Bld 144 (*)    All other components within normal limits  CBC WITH DIFFERENTIAL/PLATELET  I-STAT TROPONIN, ED    EKG  EKG Interpretation  Date/Time:  Thursday April 10 2017 14:07:28 EST Ventricular Rate:  92 PR Interval:    QRS Duration: 87 QT Interval:  390 QTC Calculation: 483 R Axis:   85 Text Interpretation:  Sinus rhythm Consider right atrial enlargement new Borderline prolonged QT interval Confirmed by Gwyneth SproutPlunkett, Paulo Keimig ([id number]) on 04/10/2017 2:15:32 PM       Radiology No results found.  Procedures Procedures (including critical care time)  Medications Ordered in ED Medications  LORazepam (ATIVAN) injection 1 mg (not administered)  sodium chloride 0.9 % bolus 1,000 mL (not administered)     Initial Impression / Assessment and Plan / ED Course  I have reviewed the triage vital signs and the nursing notes.  Pertinent labs & imaging results that were available during my care of the patient were reviewed by me and considered in my medical decision making (see chart for details).     Pt with sx most consistent with peripheral vertigo.  No systemic or infectious sx.  Normal neuro exam without weakness or cerebellar findings on exam.  Normal vision.  Sx are reproducible with movement of the head and attempting to walk.  No hx of Stroke and low likelihood.  No risk factors and normal VS. However due  to HA that preceded sx and within the 6 hour window will get CT.  Possible that this is migraine.  Will treat for peripheral vertigo and re-eval.  3:14 PM CT of head is neg for acute issues. Labs reassuring.  3:32 PM Some improvement in dizziness but still with moving he will become nauseated.  Patient given meclizine.  Will re-evaluate in about 45 minutes and ensure patient can ambulate. Pt checked out to Dr. Adriana Simasook at 1540  Final Clinical Impressions(s) / ED Diagnoses   Final diagnoses:  Vertigo    ED Discharge Orders    None       Gwyneth SproutPlunkett, Sukhmani Fetherolf, MD 04/10/17 1542

## 2017-04-10 NOTE — ED Provider Notes (Signed)
No neurological deficits.  Patient is ambulatory.  Discharge medications meclizine 25 mg and Zofran 8 mg   Donnetta Hutchingook, Kinsie Belford, MD 04/10/17 [phone]

## 2017-04-11 ENCOUNTER — Emergency Department (HOSPITAL_COMMUNITY): Payer: BLUE CROSS/BLUE SHIELD

## 2017-04-11 ENCOUNTER — Other Ambulatory Visit: Payer: Self-pay

## 2017-04-11 ENCOUNTER — Encounter (HOSPITAL_COMMUNITY): Payer: Self-pay | Admitting: Radiology

## 2017-04-11 ENCOUNTER — Inpatient Hospital Stay (HOSPITAL_COMMUNITY)
Admission: EM | Admit: 2017-04-11 | Discharge: 2017-04-17 | DRG: 065 | Disposition: A | Discharge status: Rehabilitation Facility | Payer: BLUE CROSS/BLUE SHIELD | Attending: Neurology | Admitting: Neurology

## 2017-04-11 DIAGNOSIS — I6501 Occlusion and stenosis of right vertebral artery: Secondary | ICD-10-CM | POA: Diagnosis not present

## 2017-04-11 DIAGNOSIS — H5500 Unspecified nystagmus: Secondary | ICD-10-CM | POA: Diagnosis not present

## 2017-04-11 DIAGNOSIS — I63239 Cerebral infarction due to unspecified occlusion or stenosis of unspecified carotid arteries: Secondary | ICD-10-CM | POA: Diagnosis not present

## 2017-04-11 DIAGNOSIS — I63219 Cerebral infarction due to unspecified occlusion or stenosis of unspecified vertebral arteries: Secondary | ICD-10-CM

## 2017-04-11 DIAGNOSIS — I63012 Cerebral infarction due to thrombosis of left vertebral artery: Secondary | ICD-10-CM

## 2017-04-11 DIAGNOSIS — I63211 Cerebral infarction due to unspecified occlusion or stenosis of right vertebral arteries: Principal | ICD-10-CM | POA: Diagnosis present

## 2017-04-11 DIAGNOSIS — I6509 Occlusion and stenosis of unspecified vertebral artery: Secondary | ICD-10-CM | POA: Diagnosis not present

## 2017-04-11 DIAGNOSIS — G464 Cerebellar stroke syndrome: Secondary | ICD-10-CM | POA: Diagnosis present

## 2017-04-11 DIAGNOSIS — R29701 NIHSS score 1: Secondary | ICD-10-CM | POA: Diagnosis present

## 2017-04-11 DIAGNOSIS — I63541 Cerebral infarction due to unspecified occlusion or stenosis of right cerebellar artery: Secondary | ICD-10-CM | POA: Diagnosis not present

## 2017-04-11 DIAGNOSIS — I639 Cerebral infarction, unspecified: Secondary | ICD-10-CM | POA: Diagnosis present

## 2017-04-11 DIAGNOSIS — Z836 Family history of other diseases of the respiratory system: Secondary | ICD-10-CM | POA: Diagnosis not present

## 2017-04-11 DIAGNOSIS — Z8249 Family history of ischemic heart disease and other diseases of the circulatory system: Secondary | ICD-10-CM | POA: Diagnosis not present

## 2017-04-11 DIAGNOSIS — Z791 Long term (current) use of non-steroidal anti-inflammatories (NSAID): Secondary | ICD-10-CM | POA: Diagnosis not present

## 2017-04-11 DIAGNOSIS — H532 Diplopia: Secondary | ICD-10-CM | POA: Diagnosis present

## 2017-04-11 DIAGNOSIS — M542 Cervicalgia: Secondary | ICD-10-CM | POA: Diagnosis present

## 2017-04-11 DIAGNOSIS — I253 Aneurysm of heart: Secondary | ICD-10-CM | POA: Diagnosis present

## 2017-04-11 DIAGNOSIS — I10 Essential (primary) hypertension: Secondary | ICD-10-CM | POA: Diagnosis present

## 2017-04-11 DIAGNOSIS — E876 Hypokalemia: Secondary | ICD-10-CM | POA: Diagnosis present

## 2017-04-11 DIAGNOSIS — E7841 Elevated Lipoprotein(a): Secondary | ICD-10-CM | POA: Diagnosis present

## 2017-04-11 DIAGNOSIS — R260 Ataxic gait: Secondary | ICD-10-CM | POA: Diagnosis present

## 2017-04-11 DIAGNOSIS — E785 Hyperlipidemia, unspecified: Secondary | ICD-10-CM | POA: Diagnosis present

## 2017-04-11 DIAGNOSIS — K5900 Constipation, unspecified: Secondary | ICD-10-CM | POA: Diagnosis not present

## 2017-04-11 DIAGNOSIS — F329 Major depressive disorder, single episode, unspecified: Secondary | ICD-10-CM | POA: Diagnosis present

## 2017-04-11 DIAGNOSIS — Z79899 Other long term (current) drug therapy: Secondary | ICD-10-CM

## 2017-04-11 DIAGNOSIS — Z880 Allergy status to penicillin: Secondary | ICD-10-CM

## 2017-04-11 DIAGNOSIS — R209 Unspecified disturbances of skin sensation: Secondary | ICD-10-CM | POA: Diagnosis not present

## 2017-04-11 DIAGNOSIS — I63011 Cerebral infarction due to thrombosis of right vertebral artery: Secondary | ICD-10-CM | POA: Diagnosis not present

## 2017-04-11 DIAGNOSIS — I081 Rheumatic disorders of both mitral and tricuspid valves: Secondary | ICD-10-CM | POA: Diagnosis not present

## 2017-04-11 DIAGNOSIS — Z87891 Personal history of nicotine dependence: Secondary | ICD-10-CM

## 2017-04-11 DIAGNOSIS — F419 Anxiety disorder, unspecified: Secondary | ICD-10-CM | POA: Diagnosis not present

## 2017-04-11 DIAGNOSIS — I6300 Cerebral infarction due to thrombosis of unspecified precerebral artery: Secondary | ICD-10-CM | POA: Diagnosis not present

## 2017-04-11 DIAGNOSIS — Q211 Atrial septal defect: Secondary | ICD-10-CM | POA: Diagnosis not present

## 2017-04-11 DIAGNOSIS — F988 Other specified behavioral and emotional disorders with onset usually occurring in childhood and adolescence: Secondary | ICD-10-CM | POA: Diagnosis present

## 2017-04-11 DIAGNOSIS — F411 Generalized anxiety disorder: Secondary | ICD-10-CM | POA: Diagnosis present

## 2017-04-11 DIAGNOSIS — Z832 Family history of diseases of the blood and blood-forming organs and certain disorders involving the immune mechanism: Secondary | ICD-10-CM

## 2017-04-11 DIAGNOSIS — N179 Acute kidney failure, unspecified: Secondary | ICD-10-CM | POA: Diagnosis not present

## 2017-04-11 DIAGNOSIS — I6302 Cerebral infarction due to thrombosis of basilar artery: Secondary | ICD-10-CM | POA: Diagnosis not present

## 2017-04-11 DIAGNOSIS — I69393 Ataxia following cerebral infarction: Secondary | ICD-10-CM | POA: Diagnosis not present

## 2017-04-11 DIAGNOSIS — R200 Anesthesia of skin: Secondary | ICD-10-CM | POA: Diagnosis present

## 2017-04-11 DIAGNOSIS — I63111 Cerebral infarction due to embolism of right vertebral artery: Secondary | ICD-10-CM | POA: Diagnosis not present

## 2017-04-11 DIAGNOSIS — R739 Hyperglycemia, unspecified: Secondary | ICD-10-CM | POA: Diagnosis not present

## 2017-04-11 DIAGNOSIS — G463 Brain stem stroke syndrome: Secondary | ICD-10-CM | POA: Diagnosis not present

## 2017-04-11 DIAGNOSIS — R740 Nonspecific elevation of levels of transaminase and lactic acid dehydrogenase [LDH]: Secondary | ICD-10-CM | POA: Diagnosis not present

## 2017-04-11 DIAGNOSIS — I361 Nonrheumatic tricuspid (valve) insufficiency: Secondary | ICD-10-CM | POA: Diagnosis not present

## 2017-04-11 DIAGNOSIS — Z148 Genetic carrier of other disease: Secondary | ICD-10-CM | POA: Diagnosis not present

## 2017-04-11 DIAGNOSIS — R51 Headache: Secondary | ICD-10-CM | POA: Diagnosis not present

## 2017-04-11 DIAGNOSIS — I69398 Other sequelae of cerebral infarction: Secondary | ICD-10-CM | POA: Diagnosis not present

## 2017-04-11 HISTORY — DX: Cerebral infarction, unspecified: I63.9

## 2017-04-11 LAB — CBC WITH DIFFERENTIAL/PLATELET
BASOS ABS: 0 10*3/uL (ref 0.0–0.1)
Basophils Relative: 0 %
EOS ABS: 0.1 10*3/uL (ref 0.0–0.7)
Eosinophils Relative: 1 %
HEMATOCRIT: 41.5 % (ref 39.0–52.0)
HEMOGLOBIN: 14 g/dL (ref 13.0–17.0)
Lymphocytes Relative: 12 %
Lymphs Abs: 1 10*3/uL (ref 0.7–4.0)
MCH: 31 pg (ref 26.0–34.0)
MCHC: 33.7 g/dL (ref 30.0–36.0)
MCV: 91.8 fL (ref 78.0–100.0)
Monocytes Absolute: 0.7 10*3/uL (ref 0.1–1.0)
Monocytes Relative: 8 %
NEUTROS ABS: 6.5 10*3/uL (ref 1.7–7.7)
Neutrophils Relative %: 79 %
Platelets: 153 10*3/uL (ref 150–400)
RBC: 4.52 MIL/uL (ref 4.22–5.81)
RDW: 12.4 % (ref 11.5–15.5)
WBC: 8.3 10*3/uL (ref 4.0–10.5)

## 2017-04-11 LAB — COMPREHENSIVE METABOLIC PANEL
ALT: 20 U/L (ref 17–63)
AST: 21 U/L (ref 15–41)
Albumin: 3.6 g/dL (ref 3.5–5.0)
Alkaline Phosphatase: 49 U/L (ref 38–126)
Anion gap: 4 — ABNORMAL LOW (ref 5–15)
BUN: 8 mg/dL (ref 6–20)
CHLORIDE: 111 mmol/L (ref 101–111)
CO2: 24 mmol/L (ref 22–32)
Calcium: 8.4 mg/dL — ABNORMAL LOW (ref 8.9–10.3)
Creatinine, Ser: 0.92 mg/dL (ref 0.61–1.24)
GFR calc Af Amer: >60 mL/min (ref >60)
Glucose, Bld: 120 mg/dL — ABNORMAL HIGH (ref 65–99)
POTASSIUM: 4.3 mmol/L (ref 3.5–5.1)
SODIUM: 139 mmol/L (ref 135–145)
Total Bilirubin: 0.6 mg/dL (ref 0.3–1.2)
Total Protein: 5.7 g/dL — ABNORMAL LOW (ref 6.5–8.1)

## 2017-04-11 LAB — C-REACTIVE PROTEIN

## 2017-04-11 LAB — ANTITHROMBIN III: ANTITHROMB III FUNC: 93 % (ref 75–120)

## 2017-04-11 LAB — SEDIMENTATION RATE: SED RATE: 1 mm/h (ref 0–16)

## 2017-04-11 MED ORDER — ACETAMINOPHEN 325 MG PO TABS
650.0000 mg | ORAL_TABLET | ORAL | Status: DC | PRN
  Administered 2017-04-12 – 2017-04-16 (×5): 650 mg via ORAL
  Filled 2017-04-11 (×5): qty 2

## 2017-04-11 MED ORDER — ATOMOXETINE HCL 10 MG PO CAPS
10.0000 mg | ORAL_CAPSULE | Freq: Every day | ORAL | Status: DC
  Filled 2017-04-11 (×6): qty 1

## 2017-04-11 MED ORDER — ADULT MULTIVITAMIN W/MINERALS CH
1.0000 | ORAL_TABLET | Freq: Every day | ORAL | Status: DC
  Administered 2017-04-12 – 2017-04-17 (×6): 1 via ORAL
  Filled 2017-04-11 (×6): qty 1

## 2017-04-11 MED ORDER — ONDANSETRON HCL 4 MG/2ML IJ SOLN: 4.0000 mg | Freq: Four times a day (QID) | INTRAMUSCULAR | Status: DC | PRN

## 2017-04-11 MED ORDER — ACETAMINOPHEN 160 MG/5ML PO SOLN: 650.0000 mg | ORAL | Status: DC | PRN

## 2017-04-11 MED ORDER — SODIUM CHLORIDE 0.9 % IV BOLUS (SEPSIS)
1000.0000 mL | Freq: Once | INTRAVENOUS | Status: AC
  Administered 2017-04-11: 1000 mL via INTRAVENOUS

## 2017-04-11 MED ORDER — IBUPROFEN 200 MG PO TABS
200.0000 mg | ORAL_TABLET | Freq: Four times a day (QID) | ORAL | Status: DC | PRN
  Administered 2017-04-12 – 2017-04-15 (×5): 200 mg via ORAL
  Filled 2017-04-11 (×5): qty 1

## 2017-04-11 MED ORDER — ACETAMINOPHEN 650 MG RE SUPP: 650.0000 mg | RECTAL | Status: DC | PRN

## 2017-04-11 MED ORDER — SODIUM CHLORIDE 0.9 % IV SOLN
INTRAVENOUS | Status: DC
  Administered 2017-04-14: 11:00:00 via INTRAVENOUS

## 2017-04-11 MED ORDER — IOPAMIDOL (ISOVUE-370) INJECTION 76%
INTRAVENOUS | Status: AC
  Administered 2017-04-11: 50 mL
  Administered 2017-04-12: 02:00:00 via INTRAVENOUS
  Filled 2017-04-11: qty 50

## 2017-04-11 MED ORDER — ASPIRIN 300 MG RE SUPP: 300.0000 mg | Freq: Every day | RECTAL | Status: DC

## 2017-04-11 MED ORDER — SODIUM CHLORIDE 0.9 % IV SOLN
INTRAVENOUS | Status: DC
  Administered 2017-04-11: 1000 mL via INTRAVENOUS
  Administered 2017-04-14: 08:00:00 via INTRAVENOUS

## 2017-04-11 MED ORDER — STROKE: EARLY STAGES OF RECOVERY BOOK
Freq: Once | Status: AC
  Administered 2017-04-11: 23:00:00
  Filled 2017-04-11: qty 1

## 2017-04-11 MED ORDER — ASPIRIN 325 MG PO TABS
325.0000 mg | ORAL_TABLET | Freq: Every day | ORAL | Status: DC
  Administered 2017-04-11: 325 mg via ORAL
  Filled 2017-04-11: qty 1

## 2017-04-11 MED ORDER — ONDANSETRON HCL 4 MG/2ML IJ SOLN
4.0000 mg | Freq: Once | INTRAMUSCULAR | Status: AC
  Administered 2017-04-11: 4 mg via INTRAVENOUS
  Filled 2017-04-11: qty 2

## 2017-04-11 MED ORDER — ROSUVASTATIN CALCIUM 20 MG PO TABS
20.0000 mg | ORAL_TABLET | Freq: Every day | ORAL | Status: DC
  Administered 2017-04-12 – 2017-04-16 (×5): 20 mg via ORAL
  Filled 2017-04-11 (×6): qty 1

## 2017-04-11 MED ORDER — ONDANSETRON HCL 4 MG PO TABS
8.0000 mg | ORAL_TABLET | Freq: Three times a day (TID) | ORAL | Status: DC | PRN
  Administered 2017-04-15: 8 mg via ORAL
  Filled 2017-04-11: qty 2

## 2017-04-11 MED ORDER — FLUOXETINE HCL 20 MG PO CAPS
20.0000 mg | ORAL_CAPSULE | Freq: Every day | ORAL | Status: DC
  Administered 2017-04-12 – 2017-04-17 (×6): 20 mg via ORAL
  Filled 2017-04-11 (×7): qty 1

## 2017-04-11 MED ORDER — MECLIZINE HCL 25 MG PO TABS
25.0000 mg | ORAL_TABLET | Freq: Once | ORAL | Status: AC
  Administered 2017-04-11: 25 mg via ORAL
  Filled 2017-04-11: qty 1

## 2017-04-11 MED ORDER — LORAZEPAM 2 MG/ML IJ SOLN
1.0000 mg | Freq: Once | INTRAMUSCULAR | Status: AC
  Administered 2017-04-11: 1 mg via INTRAVENOUS
  Filled 2017-04-11: qty 1

## 2017-04-11 NOTE — ED Notes (Signed)
Delay in lab draw,  MD currently at bedside. 

## 2017-04-11 NOTE — Progress Notes (Signed)
Patient arrived around 2230 from ED alert and oriented, no pain some blurred vision tingling rt side of face mild facial droop, says he has sever leaning to right when attempting to ambulate, wife in room with patient will continue with Q2 vitals and neuro checks until 0500.

## 2017-04-11 NOTE — Consult Note (Addendum)
Requesting Physician: [facility] [place] Robinson [facility]    Chief Complaint: Gait imbalance, double vision, dizziness  History obtained from: Patient and Chart     HPI:                                                                                                                                       Jake Moore is an 41 y.o. male with HLD family history  significant for blood clots, prior tobacco use presents to the emergency room after being discharged yesterday for worsening dizziness, gait imbalance nausea headache and progressive of right-sided facial numbness. She came with same symptoms yesterday that started at 8:30 in the morning. During that visit his CT head was done and patient was given meclizine. Reviewing the note is unclear if the patient was made to walk prior to discharge. Patient states that symptoms worsened yesterday at 11 PM and numbness over his face extended from nose to the right half of his face.   He states that he lifted weights the night before but denies recent chiropractic manipulation, twisting of his neck. He does complain of neck pain on the left side. His sister and mother had multiple clots. His [facility] known to have higher levels of factor VIII and elevated lipoprotein A levels. He takes crestor at home, not on ASA.  Date last known well: 12.20.18 Time last known well: 8.15 am tPA Given: outside window NIHSS: 1 Baseline MRS 0   Past Medical History:  Diagnosis Date  . Anxiety     No past surgical history on file.  No family history on file. Social History:  reports that he quit smoking about 14 months ago. His smoking use included cigarettes. He quit after 20.00 years of use. He has quit using smokeless tobacco. His smokeless tobacco use included chew. He reports that he drinks about 12.0 oz of alcohol per week. He reports that he does not use drugs.  Allergies:  Allergies  Allergen Reactions  .  Penicillins Hives    Has patient had a PCN reaction causing immediate rash, facial/tongue/throat swelling, SOB or lightheadedness with hypotension: YES Has patient had a PCN reaction causing severe rash involving mucus membranes or skin necrosis: NO Has patient had a PCN reaction that required hospitalizationNO Has patient had a PCN reaction occurring within the last 10 years: NO If all of the above answers are "NO", then may proceed with Cephalosporin use.    Medications:  I reviewed home medications   ROS:                                                                                                                                     14 systems reviewed and negative except above    Examination:                                                                                                      General: Appears well-developed and well-nourished.  Psych: Affect appropriate to situation Eyes: No scleral injection HENT: No OP obstrucion Head: Normocephalic.  Cardiovascular: Normal rate and regular rhythm.  Respiratory: Effort normal and breath sounds normal to anterior ascultation GI: Soft.  No distension. There is no tenderness.  Skin: WDI   Neurological Examination Mental Status: Alert, oriented, thought content appropriate.  Speech fluent without evidence of aphasia. Able to follow 3 step commands without difficulty. Cranial Nerves: II: Visual fields grossly normal,  III,IV, VI: ptosis not present, extra-ocular motions intact bilaterally, rotatory nystagmus + pupils equal, round, reactive to light and accommodation V,VII: smile symmetric, facial light touch sensation normal bilaterally VIII: hearing normal bilaterally IX,X: uvula rises symmetrically XI: bilateral shoulder shrug XII: midline tongue extension Motor: Right : Upper extremity   5/5    Left:      Upper extremity   5/5  Lower extremity   5/5     Lower extremity   5/5 Tone and bulk:normal tone throughout; no atrophy noted Sensory: Pinprick and light touch intact throughout, bilaterally Deep Tendon Reflexes: 2+ and symmetric throughout Plantars: Right: downgoing   Left: downgoing Cerebellar: Mild ataxia to FN and HS on left side Gait: normal gait and station     Lab Results: Basic Metabolic Panel: Recent Labs  Lab 04/10/17 1430 04/11/17 1910  NA 140 139  K 4.4 4.3  CL 106 111  CO2 26 24  GLUCOSE 144* 120*  BUN 9 8  CREATININE 0.95 0.92  CALCIUM 9.4 8.4*    CBC: Recent Labs  Lab 04/10/17 1430 04/11/17 1910  WBC 6.1 8.3  NEUTROABS 4.4 6.5  HGB 15.4 14.0  HCT 44.0 41.5  MCV 90.3 91.8  PLT 159 153    Coagulation Studies: No results for input(s): LABPROT, INR in the last 72 hours.  Imaging: Ct Head Wo Contrast  Result Date: 04/10/2017 CLINICAL DATA:  Vertigo.  Posterior head pain. EXAM: CT HEAD WITHOUT CONTRAST TECHNIQUE: Contiguous axial images were obtained from the base of the skull through  the vertex without intravenous contrast. COMPARISON:  None. FINDINGS: Brain: No evidence of acute infarction, hemorrhage, hydrocephalus, extra-axial collection or mass lesion/mass effect. Vascular: No hyperdense vessel or unexpected calcification. Skull: Normal. Negative for fracture or focal lesion. Sinuses/Orbits: No acute finding. Other: None. IMPRESSION: Normal head CT. Electronically Signed   By: Lupita RaiderJames  Green Jr, M.D.   On: 04/10/2017 14:54   Ct Angio Neck W Or Wo Contrast  Result Date: 04/11/2017 CLINICAL DATA:  Initial evaluation for acute stroke. EXAM: CT ANGIOGRAPHY NECK TECHNIQUE: Multidetector CT imaging of the neck was performed using the standard protocol during bolus administration of intravenous contrast. Multiplanar CT image reconstructions and MIPs were obtained to evaluate the vascular anatomy. Carotid stenosis measurements (when applicable) are obtained  utilizing NASCET criteria, using the distal internal carotid diameter as the denominator. CONTRAST:  <See Chart> ISOVUE-370 IOPAMIDOL (ISOVUE-370) INJECTION 76% COMPARISON:  Comparison made with prior MRI/ MRA from earlier the same day. FINDINGS: Aortic arch: Visualized aortic arch of normal caliber with normal branch pattern. No flow-limiting stenosis about the origin of the great vessels. Visualized subclavian arteries widely patent without stenosis. Right carotid system: The right common carotid artery widely patent from its origin to the bifurcation without stenosis. No significant atheromatous plaque or narrowing about the right bifurcation. Right ICA widely patent from the bifurcation to the circle of Willis without stenosis, dissection, or occlusion. Left carotid system: Left common carotid artery patent from its origin to the bifurcation without stenosis. No significant atheromatous plaque or narrowing about the left bifurcation. Left ICA patent from the bifurcation to the circle of Willis without stenosis, dissection, or occlusion. Vertebral arteries: Both of the vertebral arteries arise from the subclavian arteries. Left vertebral artery is dominant and widely patent to the vertebrobasilar junction without stenosis, dissection, or occlusion. Diminutive right vertebral artery demonstrates attenuated flow within the distal right V2 segment, and a essentially occludes at the V3 segment and is largely occluded at the level of the skullbase/cranial vault. No overt findings to suggest dissection or other abnormality. Minimal scant thready flow present within the right V4 segment, likely retrograde in nature from the basilar artery. Probable scant flow within the proximal right PICA, which is also essentially occluded. Skeleton: No acute osseus abnormality. No worrisome lytic or blastic osseous lesions. Small central disc protrusion with calcification present at C6-7 without stenosis. Other neck: No acute soft  tissue abnormality within the neck. No adenopathy. Salivary glands normal. Thyroid normal. Upper chest: Visualized upper chest within normal limits. Visualized lungs are clear. IMPRESSION: 1. 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 Finding is of uncertain etiology, without overt evidence for dissection or other acute abnormality. Dominant left vertebral artery widely patent to the vertebrobasilar junction. 2. Otherwise normal CTA of the neck. Electronically Signed   By: Rise MuBenjamin  McClintock M.D.   On: 04/11/2017 20:35   Mr Angiogram Head W Or Wo Contrast  Result Date: 04/11/2017 CLINICAL DATA:  Ataxia, stroke suspected, dizziness, RIGHT-sided facial numbness for 1 day. EXAM: MRI HEAD WITHOUT CONTRAST MRA HEAD WITHOUT CONTRAST TECHNIQUE: Multiplanar, multiecho pulse sequences of the brain and surrounding structures were obtained without intravenous contrast. Angiographic images of the head were obtained using MRA technique without contrast. COMPARISON:  Normal CT head performed 04/10/2017. FINDINGS: MRI HEAD FINDINGS Brain: Multiple areas of restricted diffusion, corresponding low ADC, subcentimeter in size, affect the RIGHT inferior cerebellum and dorsolateral medulla consistent with an acute RIGHT PICA territory infarct. No visible hemorrhage, mass lesion,  hydrocephalus, or extra-axial fluid. Normal for age cerebral volume. No significant white matter disease. Vascular: Absent flow related enhancement in the non dominant RIGHT vertebral artery. Other flow voids are preserved. Skull and upper cervical spine: Normal marrow signal. Sinuses/Orbits: Unremarkable. Other: None. MRA HEAD FINDINGS The internal carotid arteries are widely patent throughout their cervical, petrous, cavernous, supraclinoid, and terminal segments. Normal-appearing anterior and middle cerebral artery segments bilaterally. 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 There is segmental, and thread-like flow related enhancement, essentially thrombosis, of the visualized V4 segment of the RIGHT vertebral artery. The distal V4 RIGHT vertebral artery is visualized, likely retrograde from the basilar. In addition, there is diminished to absent flow related enhancement in the RIGHT PICA. The other cerebellar branches are preserved.  No saccular aneurysm. 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 Findings discussed with ordering provider. Electronically Signed   By: Elsie Stain M.D.   On: 04/11/2017 19:39   Mr Brain Wo Contrast  Result Date: 04/11/2017 CLINICAL DATA:  Ataxia, stroke suspected, dizziness, RIGHT-sided facial numbness for 1 day. EXAM: MRI HEAD WITHOUT CONTRAST MRA HEAD WITHOUT CONTRAST TECHNIQUE: Multiplanar, multiecho pulse sequences of the brain and surrounding structures were obtained without intravenous contrast. Angiographic images of the head were obtained using MRA technique without contrast. COMPARISON:  Normal CT head performed 04/10/2017. FINDINGS: MRI HEAD FINDINGS Brain: Multiple areas of restricted diffusion, corresponding low ADC, subcentimeter in size, affect the RIGHT inferior cerebellum and dorsolateral medulla consistent with an acute RIGHT PICA territory infarct. No visible hemorrhage, mass lesion, hydrocephalus, or extra-axial fluid. Normal for age cerebral volume. No significant white matter disease. Vascular: Absent flow related enhancement in the non dominant RIGHT vertebral artery. Other flow voids are preserved. Skull and upper cervical spine: Normal marrow signal. Sinuses/Orbits: Unremarkable. Other: None. MRA HEAD FINDINGS The internal carotid arteries are widely patent throughout their cervical, petrous, cavernous, supraclinoid, and terminal segments. Normal-appearing anterior and middle cerebral  artery segments bilaterally. Basilar artery is dolichoectatic but widely patent, with the LEFT vertebral is the sole contributor. There is segmental, and thread-like flow related enhancement, essentially thrombosis, of the visualized V4 segment of the RIGHT vertebral artery. The distal V4 RIGHT vertebral artery is visualized, likely retrograde from the basilar. In addition, there is diminished to absent flow related enhancement in the RIGHT PICA. The other cerebellar branches are preserved.  No saccular aneurysm. 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 Findings discussed with ordering provider. Electronically Signed   By: Elsie Stain M.D.   On: 04/11/2017 19:39     ASSESSMENT AND PLAN  41 year old male who is otherwise healthy with past history of tobacco use, family history significant for hypercoagulable condition presents with right cerebellar and medullary stroke. He was seen in the ER vertigo yesterday but discharged without MRI and presented for persistent or worsening of symptoms. I specifically he developed any new symptoms such as weakness, sensory symptoms, dysarthria etc. when compared to the previous night and patient denied this. His CTA shows an occlusion of the right vertebral artery. No obvious dissection is noted  However,  the patient does complain of pain in his neck.  Etiology of the stroke is brought at this time and given family history of hypercoagulability, it would be necessary to do a full workup of  stroke in young- including hypercoagulable conditions,  TEE if TTE is negative. I'm going to obtain MRA of the neck with fat suppression to help evaluate for dissection as this has better sensitivity. I debated on whether I start this patient on anticoagulation with heparin drip with some evidence to support anticoagulation for the first 3-6 months after  dissection.The CADISS trial, a small prospective study showed no significant differences in outcome between the 2 groups.  In the absence of fluctuating symptoms, extension of occlusion intracranially and unclear if this is even a dissection,  I have decided not to pursue anticoagulation and started him on aspirin. I do not think this is atherosclerotic disease given his age and absence of atherosclerotic burden elsewhere and did not see the utility of dual antiplatelets. If he has new symptoms or if hypercoagulable study is abnormal, I would favor starting him on [facility].    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   Acute Ischemic Stroke   Risk factors: family history, tobacco use Etiology:  Needs evaluation   Recommend #MRA neck with fat suppression  #Transthoracic Echo with bubble study to look for PFO, consider TEE if negative  # Start patient on ASA 325mg  daily, if symptoms fluctuate then start heparin drip  # Hypercoagulable panel ordered, pending  #continue Atorvastatin 80 mg/other high intensity statin # BP goal: permissive HTN upto 210 systolic, PRNs above 210 # HBAIC and Lipid profile # Telemetry monitoring # Frequent neuro checks # stroke swallow screen   Sushanth Aroor [facility] Pager Number [phone]

## 2017-04-11 NOTE — H&P (Signed)
History and Physical    Jake BameRobert F Shreeve AVW:[id number] DOB: 11/24/1975 DOA: 04/11/2017  PCP: Martha ClanShaw, William, MD  Patient coming from:  home  Chief Complaint:  dizziness  HPI: Jake BameRobert F Moore is a 41 y.o. male with medical history significant of anxiety , hld comes in with over a day of dizziness and worsening right sided facial numbness.  Pt started with symptoms yesterday am came to ED had a normal ct head and given symptoms with improvement of his dizziness, thought to be vertigo and went home.  When he got home he started having numbness to his right nostril went to bed and woke up this am with numbness spreading to his right perioral area.  No slurred speech.  No trouble swallowing.  No tongue numbness.  He did not that when he drank something cold he could not feel in on the left side of his cheek.  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  Pt reports he does have elevated levels hypoprotein A levels.    Review of Systems: As per HPI otherwise 10 point review of systems negative.   Past Medical History:  Diagnosis Date  . Anxiety     No past surgical history on file.   reports that he quit smoking about 14 months ago. His smoking use included cigarettes. He quit after 20.00 years of use. He has quit using smokeless tobacco. His smokeless tobacco use included chew. He reports that he drinks about 12.0 oz of alcohol per week. He reports that he does not use drugs.  Allergies  Allergen Reactions  . Penicillins Hives    Has patient had a PCN reaction causing immediate rash, facial/tongue/throat swelling, SOB or lightheadedness with hypotension: YES Has patient had a PCN reaction causing severe rash involving mucus membranes or skin necrosis: NO Has patient had a PCN reaction that required hospitalizationNO Has patient had a PCN reaction occurring within the last 10 years: NO If all of the above answers are "NO", then may proceed  with Cephalosporin use.    No family history on file.  Sister with factor 8 deficiency and hypoprotein a elevated levels with CAD at age 40 requiring stent, grandfather sudden death of major heart attack at a 40 years of age  Prior to Admission medications   Medication Sig Start Date End Date Taking? Authorizing Provider  atomoxetine (STRATTERA) 10 MG capsule Take 10 mg by mouth daily.   Yes [provider]  FLUoxetine (PROZAC) 20 MG tablet Take 20 mg by mouth daily.   Yes [provider]  ibuprofen (ADVIL,MOTRIN) 200 MG tablet Take 200 mg by mouth every 6 (six) hours as needed for moderate pain.   Yes [provider]  meclizine (ANTIVERT) 25 MG tablet Take 1 tablet (25 mg total) by mouth 3 (three) times daily as needed for dizziness. 04/10/17  Yes Donnetta Hutchingook, Brian, MD  Multiple Vitamin (MULTIVITAMIN) tablet Take 1 tablet by mouth daily.   Yes [provider]  ondansetron (ZOFRAN) 8 MG tablet Take 1 tablet (8 mg total) by mouth every 8 (eight) hours as needed for nausea or vomiting. 04/10/17  Yes Donnetta Hutchingook, Brian, MD  rosuvastatin (CRESTOR) 10 MG tablet Take 10 mg by mouth daily.   Yes [provider]  naproxen (NAPROSYN) 500 MG tablet Take 1 tablet (500 mg total) by mouth 2 (two) times daily. Patient not taking: Reported on 07/14/2016 02/26/12   Johnsie Kindredhatten, Carmen L,  NP    Physical Exam: Vitals:   04/11/17 1707 04/11/17 1730 04/11/17 1915 04/11/17 1930  BP:  (!) 128/96 (!) 128/96 (!) 130/93  Pulse:  73 76 86  Resp:  19 (!) 21 12  Temp:      TempSrc:      SpO2:  96% 98% 100%  Weight: 74.8 kg (165 lb)     Height: 5\' 10"  (1.778 m)       Constitutional: NAD, calm, comfortable Vitals:   04/11/17 1707 04/11/17 1730 04/11/17 1915 04/11/17 1930  BP:  (!) 128/96 (!) 128/96 (!) 130/93  Pulse:  73 76 86  Resp:  19 (!) 21 12  Temp:      TempSrc:      SpO2:  96% 98% 100%  Weight: 74.8 kg (165 lb)     Height: 5\' 10"  (1.778 m)      Eyes: PERRL, lids and  conjunctivae normal ENMT: Mucous membranes are moist. Posterior pharynx clear of any exudate or lesions.Normal dentition.  Neck: normal, supple, no masses, no thyromegaly Respiratory: clear to auscultation bilaterally, no wheezing, no crackles. Normal respiratory effort. No accessory muscle use.  Cardiovascular: Regular rate and rhythm, no murmurs / rubs / gallops. No extremity edema. 2+ pedal pulses. No carotid bruits.  Abdomen: no tenderness, no masses palpated. No hepatosplenomegaly. Bowel sounds positive.  Musculoskeletal: no clubbing / cyanosis. No joint deformity upper and lower extremities. Good ROM, no contractures. Normal muscle tone.  Skin: no rashes, lesions, ulcers. No induration Neurologic: CN 2-12 grossly intact. Sensation intact, DTR normal. Strength 5/5 in all 4.  Psychiatric: Normal judgment and insight. Alert and oriented x 3. Normal mood.    Labs on Admission: I have personally reviewed following labs and imaging studies  CBC: Recent Labs  Lab 04/10/17 1430 04/11/17 1910  WBC 6.1 8.3  NEUTROABS 4.4 6.5  HGB 15.4 14.0  HCT 44.0 41.5  MCV 90.3 91.8  PLT 159 153   Basic Metabolic Panel: Recent Labs  Lab 04/10/17 1430 04/11/17 1910  NA 140 139  K 4.4 4.3  CL 106 111  CO2 26 24  GLUCOSE 144* 120*  BUN 9 8  CREATININE 0.95 0.92  CALCIUM 9.4 8.4*   GFR: Estimated Creatinine Clearance: 110.2 mL/min (by C-G formula based on SCr of 0.92 mg/dL). Liver Function Tests: Recent Labs  Lab 04/11/17 1910  AST 21  ALT 20  ALKPHOS 49  BILITOT 0.6  PROT 5.7*  ALBUMIN 3.6   No results for input(s): LIPASE, AMYLASE in the last 168 hours. No results for input(s): AMMONIA in the last 168 hours. Coagulation Profile: No results for input(s): INR, PROTIME in the last 168 hours. Cardiac Enzymes: No results for input(s): CKTOTAL, CKMB, CKMBINDEX, TROPONINI in the last 168 hours. BNP (last 3 results) No results for input(s): PROBNP in the last 8760 hours. HbA1C: No  results for input(s): HGBA1C in the last 72 hours. CBG: No results for input(s): GLUCAP in the last 168 hours. Lipid Profile: No results for input(s): CHOL, HDL, LDLCALC, TRIG, CHOLHDL, LDLDIRECT in the last 72 hours. Thyroid Function Tests: No results for input(s): TSH, T4TOTAL, FREET4, T3FREE, THYROIDAB in the last 72 hours. Anemia Panel: No results for input(s): VITAMINB12, FOLATE, FERRITIN, TIBC, IRON, RETICCTPCT in the last 72 hours. Urine analysis: No results found for: COLORURINE, APPEARANCEUR, LABSPEC, PHURINE, GLUCOSEU, HGBUR, BILIRUBINUR, KETONESUR, PROTEINUR, UROBILINOGEN, NITRITE, LEUKOCYTESUR Sepsis Labs: !!!!!!!!!!!!!!!!!!!!!!!!!!!!!!!!!!!!!!!!!!!! @LABRCNTIP (procalcitonin:4,lacticidven:4) )No results found for this or any previous visit (from the past 240 hour(s)).  Radiological Exams on Admission: Ct Head Wo Contrast  Result Date: 04/10/2017 CLINICAL DATA:  Vertigo.  Posterior head pain. EXAM: CT HEAD WITHOUT CONTRAST TECHNIQUE: Contiguous axial images were obtained from the base of the skull through the vertex without intravenous contrast. COMPARISON:  None. FINDINGS: Brain: No evidence of acute infarction, hemorrhage, hydrocephalus, extra-axial collection or mass lesion/mass effect. Vascular: No hyperdense vessel or unexpected calcification. Skull: Normal. Negative for fracture or focal lesion. Sinuses/Orbits: No acute finding. Other: None. IMPRESSION: Normal head CT. Electronically Signed   By: Lupita Raider, M.D.   On: 04/10/2017 14:54   Ct Angio Neck W Or Wo Contrast  Result Date: 04/11/2017 CLINICAL DATA:  Initial evaluation for acute stroke. EXAM: CT ANGIOGRAPHY NECK TECHNIQUE: Multidetector CT imaging of the neck was performed using the standard protocol during bolus administration of intravenous contrast. Multiplanar CT image reconstructions and MIPs were obtained to evaluate the vascular anatomy. Carotid stenosis measurements (when applicable) are obtained  utilizing NASCET criteria, using the distal internal carotid diameter as the denominator. CONTRAST:  <See Chart> ISOVUE-370 IOPAMIDOL (ISOVUE-370) INJECTION 76% COMPARISON:  Comparison made with prior MRI/ MRA from earlier the same day. FINDINGS: Aortic arch: Visualized aortic arch of normal caliber with normal branch pattern. No flow-limiting stenosis about the origin of the great vessels. Visualized subclavian arteries widely patent without stenosis. Right carotid system: The right common carotid artery widely patent from its origin to the bifurcation without stenosis. No significant atheromatous plaque or narrowing about the right bifurcation. Right ICA widely patent from the bifurcation to the circle of Willis without stenosis, dissection, or occlusion. Left carotid system: Left common carotid artery patent from its origin to the bifurcation without stenosis. No significant atheromatous plaque or narrowing about the left bifurcation. Left ICA patent from the bifurcation to the circle of Willis without stenosis, dissection, or occlusion. Vertebral arteries: Both of the vertebral arteries arise from the subclavian arteries. Left vertebral artery is dominant and widely patent to the vertebrobasilar junction without stenosis, dissection, or occlusion. Diminutive right vertebral artery demonstrates attenuated flow within the distal right V2 segment, and a essentially occludes at the V3 segment and is largely occluded at the level of the skullbase/cranial vault. No overt findings to suggest dissection or other abnormality. Minimal scant thready flow present within the right V4 segment, likely retrograde in nature from the basilar artery. Probable scant flow within the proximal right PICA, which is also essentially occluded. Skeleton: No acute osseus abnormality. No worrisome lytic or blastic osseous lesions. Small central disc protrusion with calcification present at C6-7 without stenosis. Other neck: No acute soft  tissue abnormality within the neck. No adenopathy. Salivary glands normal. Thyroid normal. Upper chest: Visualized upper chest within normal limits. Visualized lungs are clear. IMPRESSION: 1. 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 Finding is of uncertain etiology, without overt evidence for dissection or other acute abnormality. Dominant left vertebral artery widely patent to the vertebrobasilar junction. 2. Otherwise normal CTA of the neck. Electronically Signed   By: Rise Mu M.D.   On: 04/11/2017 20:35   Mr Angiogram Head W Or Wo Contrast  Result Date: 04/11/2017 CLINICAL DATA:  Ataxia, stroke suspected, dizziness, RIGHT-sided facial numbness for 1 day. EXAM: MRI HEAD WITHOUT CONTRAST MRA HEAD WITHOUT CONTRAST TECHNIQUE: Multiplanar, multiecho pulse sequences of the brain and surrounding structures were obtained without intravenous contrast. Angiographic images of the head were obtained using MRA technique without contrast. COMPARISON:  Normal CT head performed  04/10/2017. FINDINGS: MRI HEAD FINDINGS Brain: Multiple areas of restricted diffusion, corresponding low ADC, subcentimeter in size, affect the RIGHT inferior cerebellum and dorsolateral medulla consistent with an acute RIGHT PICA territory infarct. No visible hemorrhage, mass lesion, hydrocephalus, or extra-axial fluid. Normal for age cerebral volume. No significant white matter disease. Vascular: Absent flow related enhancement in the non dominant RIGHT vertebral artery. Other flow voids are preserved. Skull and upper cervical spine: Normal marrow signal. Sinuses/Orbits: Unremarkable. Other: None. MRA HEAD FINDINGS The internal carotid arteries are widely patent throughout their cervical, petrous, cavernous, supraclinoid, and terminal segments. Normal-appearing anterior and middle cerebral artery segments bilaterally. 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 There is segmental, and thread-like flow related enhancement, essentially thrombosis, of the visualized V4 segment of the RIGHT vertebral artery. The distal V4 RIGHT vertebral artery is visualized, likely retrograde from the basilar. In addition, there is diminished to absent flow related enhancement in the RIGHT PICA. The other cerebellar branches are preserved.  No saccular aneurysm. 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 Findings discussed with ordering provider. Electronically Signed   By: Elsie Stain M.D.   On: 04/11/2017 19:39   Mr Brain Wo Contrast  Result Date: 04/11/2017 CLINICAL DATA:  Ataxia, stroke suspected, dizziness, RIGHT-sided facial numbness for 1 day. EXAM: MRI HEAD WITHOUT CONTRAST MRA HEAD WITHOUT CONTRAST TECHNIQUE: Multiplanar, multiecho pulse sequences of the brain and surrounding structures were obtained without intravenous contrast. Angiographic images of the head were obtained using MRA technique without contrast. COMPARISON:  Normal CT head performed 04/10/2017. FINDINGS: MRI HEAD FINDINGS Brain: Multiple areas of restricted diffusion, corresponding low ADC, subcentimeter in size, affect the RIGHT inferior cerebellum and dorsolateral medulla consistent with an acute RIGHT PICA territory infarct. No visible hemorrhage, mass lesion, hydrocephalus, or extra-axial fluid. Normal for age cerebral volume. No significant white matter disease. Vascular: Absent flow related enhancement in the non dominant RIGHT vertebral artery. Other flow voids are preserved. Skull and upper cervical spine: Normal marrow signal. Sinuses/Orbits: Unremarkable. Other: None. MRA HEAD FINDINGS The internal carotid arteries are widely patent throughout their cervical, petrous, cavernous, supraclinoid, and terminal segments. Normal-appearing anterior and middle cerebral  artery segments bilaterally. Basilar artery is dolichoectatic but widely patent, with the LEFT vertebral is the sole contributor. There is segmental, and thread-like flow related enhancement, essentially thrombosis, of the visualized V4 segment of the RIGHT vertebral artery. The distal V4 RIGHT vertebral artery is visualized, likely retrograde from the basilar. In addition, there is diminished to absent flow related enhancement in the RIGHT PICA. The other cerebellar branches are preserved.  No saccular aneurysm. 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 Findings discussed with ordering provider. Electronically Signed   By: Elsie Stain M.D.   On: 04/11/2017 19:39    EKG: Independently reviewed.  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  Do hypercoag work up.  Obtain cardiac echo.  Follow up on neuro recommendations for further intervention.  Lipids and hga1c in am.  Increase crestor.  Active Problems:   Hyperlipidemia- increasing crestor home dose   Occlusion and stenosis of vertebral artery- as above  DVT prophylaxis:  scds Code Status:  full Family Communication: wife Disposition Plan:  Per day team Consults called:  neuro Admission status:  admit   Brittney Mucha A MD [facility]  If 7PM-7AM, please contact night-coverage [email or web address] Password [email or web address]  04/11/2017, 9:14 PM

## 2017-04-11 NOTE — ED Triage Notes (Signed)
Moved to room per Dr. Denton LankSteinl.

## 2017-04-11 NOTE — ED Notes (Signed)
Patient transported to MRI 

## 2017-04-11 NOTE — ED Notes (Signed)
Patient transported to CT 

## 2017-04-11 NOTE — ED Triage Notes (Signed)
Pt was seen and treated here yesterday for vertigo.  Today st's he is not any better continues to have severe dizziness with right side of face numbness   No facial droop present at this time.  Pt also c/o pain to back of head

## 2017-04-11 NOTE — Consult Note (Signed)
[facility] [place]  Patient name: Jake Moore MRN: [id number] DOB: 06/05/1975 Sex: male   REQUESTING PROVIDER:   ER, and patient request   REASON FOR CONSULT:    Right brain stroke  HISTORY OF PRESENT ILLNESS:   Jake Moore is a 41 y.o. male, who re-presented to the hospital today with a 2-day history of severe dizziness.  He was seen in the emergency department yesterday with dizziness and nausea and vomiting.  His workup consisted of a head CT scan which was negative for acute pathology.  He was diagnosed with peripheral vertigo and sent home.  He then later developed new symptoms consisting of right-sided facial numbness.  He also reports some visual disturbance which he describes as objects oscillating with you.  He denies any symptoms in the arms or legs.  He did not have any episodes of slurred speech.  He does describe an episode of chest pain within the last month which he attributed to anxiety.  The patient has a family history of hypercoagulable disorder.  He has a sister with MI at an early age.  His grandfather also had early cardiac disease.  He had a coronary CT and March 2018 with a calcium score of 0.  No plaque or stenosis was noted on CT angiogram.  He has been taking Crestor for hypercholesterolemia.  He has not been taking an aspirin.  PAST MEDICAL HISTORY    Past Medical History:  Diagnosis Date  . Anxiety      FAMILY HISTORY   Hypercoagulable disorder and sister. Early coronary disease in sister and grandfather.  SOCIAL HISTORY:   Social History   Socioeconomic History  . Marital status: Single    Spouse name: Not on file  . Number of children: Not on file  . Years of education: Not on file  . Highest education level: Not on file  Social Needs  . Financial resource strain: Not on file  . Food insecurity - worry: Not on file  . Food insecurity - inability: Not on file  .  Transportation needs - medical: Not on file  . Transportation needs - non-medical: Not on file  Occupational History  . Not on file  Tobacco Use  . Smoking status: Former Smoker    Years: 20.00    Types: Cigarettes    Last attempt to quit: 01/15/2016    Years since quitting: 1.2  . Smokeless tobacco: Former NeurosurgeonUser    Types: Chew  Substance and Sexual Activity  . Alcohol use: Yes    Alcohol/week: 12.0 oz    Types: 20 Cans of beer per week    Comment: occasonal  . Drug use: No  . Sexual activity: Not on file  Other Topics Concern  . Not on file  Social History Narrative  . Not on file    ALLERGIES:    Allergies  Allergen Reactions  . Penicillins Hives    Has patient had a PCN reaction causing immediate rash, facial/tongue/throat swelling, SOB or lightheadedness with hypotension: YES Has patient had a PCN reaction causing severe rash involving mucus membranes or skin necrosis: NO Has patient had a PCN reaction that required hospitalizationNO Has patient had a PCN reaction occurring within the last 10 years: NO If all of the above answers are "NO", then may proceed with Cephalosporin use.    CURRENT MEDICATIONS:    Current Facility-Administered Medications  Medication Dose Route Frequency Provider Last Rate Last Dose  .  stroke: mapping our early stages of recovery book   Does not apply Once Tarry Kosavid, Rachal A, MD      . 0.9 %  sodium chloride infusion   Intravenous Continuous Tarry Kosavid, Rachal A, MD      . acetaminophen (TYLENOL) tablet 650 mg  650 mg Oral Q4H PRN Haydee Monicaavid, Rachal A, MD       Or  . acetaminophen (TYLENOL) solution 650 mg  650 mg Per Tube Q4H PRN Haydee Monicaavid, Rachal A, MD       Or  . acetaminophen (TYLENOL) suppository 650 mg  650 mg Rectal Q4H PRN Haydee Monicaavid, Rachal A, MD      . aspirin suppository 300 mg  300 mg Rectal Daily Haydee Monicaavid, Rachal A, MD       Or  . aspirin tablet 325 mg  325 mg Oral Daily Haydee Monicaavid, Rachal A, MD      . Melene Muller[START ON 04/12/2017] atomoxetine (STRATTERA)  capsule 10 mg  10 mg Oral Daily Haydee Monicaavid, Rachal A, MD      . Melene Muller[START ON 04/12/2017] FLUoxetine (PROZAC) tablet 20 mg  20 mg Oral Daily Tarry Kosavid, Rachal A, MD      . ibuprofen (ADVIL,MOTRIN) tablet 200 mg  200 mg Oral Q6H PRN Haydee Monicaavid, Rachal A, MD      . Melene Muller[START ON 04/12/2017] multivitamin with minerals tablet 1 tablet  1 tablet Oral Daily Tarry Kosavid, Rachal A, MD      . ondansetron (ZOFRAN) injection 4 mg  4 mg Intravenous Q6H PRN Haydee Monicaavid, Rachal A, MD      . ondansetron [facility](ZOFRAN) tablet 8 mg  8 mg Oral Q8H PRN Haydee Monicaavid, Rachal A, MD      . Melene Muller[START ON 04/12/2017] rosuvastatin (CRESTOR) tablet 20 mg  20 mg Oral q1800 Haydee Monicaavid, Rachal A, MD        REVIEW OF SYSTEMS:   [X]  denotes positive finding, [ ]  denotes negative finding Cardiac  Comments:  Chest pain or chest pressure:    Shortness of breath upon exertion:    Short of breath when lying flat:    Irregular heart rhythm:        Vascular    Pain in calf, thigh, or hip brought on by ambulation:    Pain in feet at night that wakes you up from your sleep:     Blood clot in your veins:    Leg swelling:         Pulmonary    Oxygen at home:    Productive cough:     Wheezing:         Neurologic    Sudden weakness in arms or legs:     Sudden numbness in arms or legs:     Sudden onset of difficulty speaking or slurred speech:    Temporary loss of vision in one eye:     Problems with dizziness:  x       Gastrointestinal    Blood in stool:      Vomited blood:         Genitourinary    Burning when urinating:     Blood in urine:        Psychiatric    Major depression:         Hematologic    Bleeding problems:    Problems with blood clotting too easily:        Skin    Rashes or ulcers:        Constitutional    Fever or chills:  PHYSICAL EXAM:   Vitals:   04/11/17 1915 04/11/17 1930 04/11/17 2208 04/11/17 2227  BP: (!) 128/96 (!) 130/93 (!) 140/96 122/87  Pulse: 76 86 80 70  Resp: (!) 21 12 17 18   Temp:    98 F (36.7 C)  TempSrc:     Oral  SpO2: 98% 100% 98% 97%  Weight:    164 lb 14.5 oz (74.8 kg)  Height:    5\' 10"  (1.778 m)    GENERAL: The patient is a well-nourished male, in no acute distress. The vital signs are documented above. CARDIAC: There is a regular rate and rhythm.  PULMONARY: Nonlabored respirations ABDOMEN: Soft and non-tender with normal pitched bowel sounds.  MUSCULOSKELETAL: There are no major deformities or cyanosis. NEUROLOGIC: No focal weakness or paresthesias are detected.  Positive for visual disturbance and right-sided facial numbness SKIN: There are no ulcers or rashes noted. PSYCHIATRIC: The patient has a normal affect.  STUDIES:   I have reviewed the following studies: CT angiogram: 1. 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 Finding is of uncertain etiology, without overt evidence for dissection or other acute abnormality. Dominant left vertebral artery widely patent to the vertebrobasilar junction. 2. Otherwise normal CTA of the neck.  MRI: 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 ASSESSMENT and PLAN   Right inferior cerebellar and medullary infarcts: Etiology is unclear at this time.  This could potentially be embolic with unknown source or potentially secondary to vertebral artery dissection which is not visualized on imaging studies.  Patient is to be admitted to the hospital service for observation.  Neurology is following.  He is undergoing hypercoagulable workup.  Antiplatelet and anticoagulation recommendations will be deferred to neurology.  No acute surgical or interventional treatment is recommended at this time.   Durene Cal, MD [facility] [phone] Pager [phone]

## 2017-04-11 NOTE — ED Provider Notes (Signed)
[facility] Provider Note   CSN: [id number] Arrival date & time: 04/11/17  1651     History   Chief Complaint Chief Complaint  Patient presents with  . Dizziness  . Nausea    HPI Jake Moore is a 41 y.o. male with worsening symptoms since yesterday, including right-sided facial numbness, dizziness, feeling of off balance, nausea, and headache.  Patient was seen yesterday in the ED with symptoms including dizziness, lightheadedness, nausea and vomiting, that all started around 0830.  During that visit CT head was done and negative.  Patient also with normal neurologic exam during this visit.  Patient discharged with meclizine for vertigo and strict return precautions.  Patient states symptoms worsened yesterday evening around 11 PM, with right-sided facial numbness that was new.  Jake Moore states Jake Moore has been having a harder time focusing on people and objects, Jake Moore is seeing double vision, and Jake Moore states "everything is falling to the right."  Reports a pressure sensation for headache located in the front of his head and posterior right.  States nausea and vomiting is better since yesterday and Jake Moore had some improvement throughout the day with meclizine.  Denies any symptoms in his extremities including numbness or weakness, facial droop, slurred speech.  No history of stroke.  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  The history is provided by the patient and the spouse.    Past Medical History:  Diagnosis Date  . Anxiety     Patient Active Problem List   Diagnosis Date Noted  . Stroke (HCC) 04/11/2017  . Hyperlipidemia 04/11/2017  . 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 Anxiety     No past surgical history on file.     Home Medications    Prior to Admission medications   Medication Sig Start Date End Date Taking? Authorizing Provider  atomoxetine (STRATTERA) 10 MG capsule Take 10 mg by  mouth daily.   Yes [provider]  FLUoxetine (PROZAC) 20 MG tablet Take 20 mg by mouth daily.   Yes [provider]  ibuprofen (ADVIL,MOTRIN) 200 MG tablet Take 200 mg by mouth every 6 (six) hours as needed for moderate pain.   Yes [provider]  meclizine (ANTIVERT) 25 MG tablet Take 1 tablet (25 mg total) by mouth 3 (three) times daily as needed for dizziness. 04/10/17  Yes Donnetta Hutching, MD  Multiple Vitamin (MULTIVITAMIN) tablet Take 1 tablet by mouth daily.   Yes [provider]  ondansetron (ZOFRAN) 8 MG tablet Take 1 tablet (8 mg total) by mouth every 8 (eight) hours as needed for nausea or vomiting. 04/10/17  Yes Donnetta Hutching, MD  rosuvastatin (CRESTOR) 10 MG tablet Take 10 mg by mouth daily.   Yes [provider]  naproxen (NAPROSYN) 500 MG tablet Take 1 tablet (500 mg total) by mouth 2 (two) times daily. Patient not taking: Reported on 07/14/2016 02/26/12   Johnsie Kindred, NP    Family History No family history on file.  Social History Social History   Tobacco Use  . Smoking status: Former Smoker    Years: 20.00    Types: Cigarettes    Last attempt to quit: 01/15/2016    Years since quitting: 1.2  . Smokeless tobacco: Former Neurosurgeon    Types: Chew  Substance Use Topics  . Alcohol use: Yes    Alcohol/week: 12.0 oz    Types: 20 Cans of beer per week  Comment: occasonal  . Drug use: No     Allergies   Penicillins   Review of Systems Review of Systems  Constitutional: Negative for fever.  Musculoskeletal: Negative for neck pain and neck stiffness.  Neurological: Positive for dizziness, numbness and headaches. Negative for seizures, syncope, facial asymmetry, speech difficulty and weakness.  Hematological: Does not bruise/bleed easily.  Psychiatric/Behavioral: Negative for confusion.  All other systems reviewed and are negative.    Physical Exam Updated Vital Signs BP 122/87 (BP Location: Right Arm)   Pulse 70    Temp 98 F (36.7 C) (Oral)   Resp 18   Ht 5\' 10"  (1.778 m)   Wt 74.8 kg (164 lb 14.5 oz)   SpO2 97%   BMI 23.66 kg/m   Physical Exam  Constitutional: Jake Moore appears well-developed and well-nourished.  HENT:  Head: Normocephalic and atraumatic.  Mouth/Throat: Oropharynx is clear and moist.  Eyes: Conjunctivae are normal.  Neck: Normal range of motion. Neck supple.  Cardiovascular: Normal rate, regular rhythm, normal heart sounds and intact distal pulses.  Pulmonary/Chest: Effort normal and breath sounds normal. No respiratory distress.  Abdominal: Soft. Bowel sounds are normal. Jake Moore exhibits no distension. There is no tenderness.  Neurological: Jake Moore is alert.  Mental Status:  Alert, oriented, thought content appropriate, able to give a coherent history. Speech fluent without evidence of aphasia. Able to follow 2 step commands without difficulty.  Cranial Nerves:  II, III,IV, VI: Peripheral visual fields grossly normal, pupillary response is not consensual on left when light shown in right. Rotary nystagmus present b/l eyes at rest. Rightward gaze preference. ptosis not present V,VII: smile symmetric, Decreased sensation to light touch on right face.  VIII: hearing grossly normal to voice  X: uvula elevates asymmetrically, less on right XI: bilateral shoulder shrug symmetric and strong XII: midline tongue extension without fassiculations Motor:  Normal tone. 5/5 in upper and lower extremities bilaterally including strong and equal grip strength and dorsiflexion/plantar flexion Sensory: Pinprick and light touch normal in all extremities.  Deep Tendon Reflexes: 2+ and symmetric in the biceps and patella Cerebellar: some searching on finger-to-nose with right upper extremities. Left upper extremity with normal finger-to-nose CV: distal pulses palpable throughout    Skin: Skin is warm.  Psychiatric: Jake Moore has a normal mood and affect. His behavior is normal.  Nursing note and vitals  reviewed.    ED Treatments / Results  Labs (all labs ordered are listed, but only abnormal results are displayed) Labs Reviewed  COMPREHENSIVE METABOLIC PANEL - Abnormal; Notable for the following components:      Result Value   Glucose, Bld 120 (*)    Calcium 8.4 (*)    Total Protein 5.7 (*)    Anion gap 4 (*)    All other components within normal limits  CBC WITH DIFFERENTIAL/PLATELET  SEDIMENTATION RATE  C-REACTIVE PROTEIN  ANTITHROMBIN III  PROTEIN C ACTIVITY  PROTEIN C, TOTAL  PROTEIN S ACTIVITY  PROTEIN S, TOTAL  LUPUS ANTICOAGULANT PANEL  BETA-2-GLYCOPROTEIN I ABS, IGG/M/A  HOMOCYSTEINE  FACTOR 5 LEIDEN  PROTHROMBIN GENE MUTATION  CARDIOLIPIN ANTIBODIES, IGG, IGM, IGA  HIV ANTIBODY (ROUTINE TESTING)  HEMOGLOBIN A1C  LIPID PANEL    EKG  EKG Interpretation None       Radiology Ct Head Wo Contrast  Result Date: 04/10/2017 CLINICAL DATA:  Vertigo.  Posterior head pain. EXAM: CT HEAD WITHOUT CONTRAST TECHNIQUE: Contiguous axial images were obtained from the base of the skull through the vertex without intravenous contrast. COMPARISON:  None. FINDINGS: Brain: No evidence of acute infarction, hemorrhage, hydrocephalus, extra-axial collection or mass lesion/mass effect. Vascular: No hyperdense vessel or unexpected calcification. Skull: Normal. Negative for fracture or focal lesion. Sinuses/Orbits: No acute finding. Other: None. IMPRESSION: Normal head CT. Electronically Signed   By: Lupita Raider, M.D.   On: 04/10/2017 14:54   Ct Angio Neck W Or Wo Contrast  Result Date: 04/11/2017 CLINICAL DATA:  Initial evaluation for acute stroke. EXAM: CT ANGIOGRAPHY NECK TECHNIQUE: Multidetector CT imaging of the neck was performed using the standard protocol during bolus administration of intravenous contrast. Multiplanar CT image reconstructions and MIPs were obtained to evaluate the vascular anatomy. Carotid stenosis measurements (when applicable) are obtained utilizing  NASCET criteria, using the distal internal carotid diameter as the denominator. CONTRAST:  <See Chart> ISOVUE-370 IOPAMIDOL (ISOVUE-370) INJECTION 76% COMPARISON:  Comparison made with prior MRI/ MRA from earlier the same day. FINDINGS: Aortic arch: Visualized aortic arch of normal caliber with normal branch pattern. No flow-limiting stenosis about the origin of the great vessels. Visualized subclavian arteries widely patent without stenosis. Right carotid system: The right common carotid artery widely patent from its origin to the bifurcation without stenosis. No significant atheromatous plaque or narrowing about the right bifurcation. Right ICA widely patent from the bifurcation to the circle of Willis without stenosis, dissection, or occlusion. Left carotid system: Left common carotid artery patent from its origin to the bifurcation without stenosis. No significant atheromatous plaque or narrowing about the left bifurcation. Left ICA patent from the bifurcation to the circle of Willis without stenosis, dissection, or occlusion. Vertebral arteries: Both of the vertebral arteries arise from the subclavian arteries. Left vertebral artery is dominant and widely patent to the vertebrobasilar junction without stenosis, dissection, or occlusion. Diminutive right vertebral artery demonstrates attenuated flow within the distal right V2 segment, and a essentially occludes at the V3 segment and is largely occluded at the level of the skullbase/cranial vault. No overt findings to suggest dissection or other abnormality. Minimal scant thready flow present within the right V4 segment, likely retrograde in nature from the basilar artery. Probable scant flow within the proximal right PICA, which is also essentially occluded. Skeleton: No acute osseus abnormality. No worrisome lytic or blastic osseous lesions. Small central disc protrusion with calcification present at C6-7 without stenosis. Other neck: No acute soft tissue  abnormality within the neck. No adenopathy. Salivary glands normal. Thyroid normal. Upper chest: Visualized upper chest within normal limits. Visualized lungs are clear. IMPRESSION: 1. 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 Finding is of uncertain etiology, without overt evidence for dissection or other acute abnormality. Dominant left vertebral artery widely patent to the vertebrobasilar junction. 2. Otherwise normal CTA of the neck. Electronically Signed   By: Rise Mu M.D.   On: 04/11/2017 20:35   Mr Angiogram Head W Or Wo Contrast  Result Date: 04/11/2017 CLINICAL DATA:  Ataxia, stroke suspected, dizziness, RIGHT-sided facial numbness for 1 day. EXAM: MRI HEAD WITHOUT CONTRAST MRA HEAD WITHOUT CONTRAST TECHNIQUE: Multiplanar, multiecho pulse sequences of the brain and surrounding structures were obtained without intravenous contrast. Angiographic images of the head were obtained using MRA technique without contrast. COMPARISON:  Normal CT head performed 04/10/2017. FINDINGS: MRI HEAD FINDINGS Brain: Multiple areas of restricted diffusion, corresponding low ADC, subcentimeter in size, affect the RIGHT inferior cerebellum and dorsolateral medulla consistent with an acute RIGHT PICA territory infarct. No visible hemorrhage, mass lesion, hydrocephalus, or extra-axial fluid. Normal for age  cerebral volume. No significant white matter disease. Vascular: Absent flow related enhancement in the non dominant RIGHT vertebral artery. Other flow voids are preserved. Skull and upper cervical spine: Normal marrow signal. Sinuses/Orbits: Unremarkable. Other: None. MRA HEAD FINDINGS The internal carotid arteries are widely patent throughout their cervical, petrous, cavernous, supraclinoid, and terminal segments. Normal-appearing anterior and middle cerebral artery segments bilaterally. 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 There is segmental, and thread-like flow related enhancement, essentially thrombosis, of the visualized V4 segment of the RIGHT vertebral artery. The distal V4 RIGHT vertebral artery is visualized, likely retrograde from the basilar. In addition, there is diminished to absent flow related enhancement in the RIGHT PICA. The other cerebellar branches are preserved.  No saccular aneurysm. 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 Findings discussed with ordering provider. Electronically Signed   By: Elsie Stain M.D.   On: 04/11/2017 19:39   Mr Brain Wo Contrast  Result Date: 04/11/2017 CLINICAL DATA:  Ataxia, stroke suspected, dizziness, RIGHT-sided facial numbness for 1 day. EXAM: MRI HEAD WITHOUT CONTRAST MRA HEAD WITHOUT CONTRAST TECHNIQUE: Multiplanar, multiecho pulse sequences of the brain and surrounding structures were obtained without intravenous contrast. Angiographic images of the head were obtained using MRA technique without contrast. COMPARISON:  Normal CT head performed 04/10/2017. FINDINGS: MRI HEAD FINDINGS Brain: Multiple areas of restricted diffusion, corresponding low ADC, subcentimeter in size, affect the RIGHT inferior cerebellum and dorsolateral medulla consistent with an acute RIGHT PICA territory infarct. No visible hemorrhage, mass lesion, hydrocephalus, or extra-axial fluid. Normal for age cerebral volume. No significant white matter disease. Vascular: Absent flow related enhancement in the non dominant RIGHT vertebral artery. Other flow voids are preserved. Skull and upper cervical spine: Normal marrow signal. Sinuses/Orbits: Unremarkable. Other: None. MRA HEAD FINDINGS The internal carotid arteries are widely patent throughout their cervical, petrous, cavernous, supraclinoid, and terminal segments. Normal-appearing anterior and middle cerebral  artery segments bilaterally. Basilar artery is dolichoectatic but widely patent, with the LEFT vertebral is the sole contributor. There is segmental, and thread-like flow related enhancement, essentially thrombosis, of the visualized V4 segment of the RIGHT vertebral artery. The distal V4 RIGHT vertebral artery is visualized, likely retrograde from the basilar. In addition, there is diminished to absent flow related enhancement in the RIGHT PICA. The other cerebellar branches are preserved.  No saccular aneurysm. 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 Findings discussed with ordering provider. Electronically Signed   By: Elsie Stain M.D.   On: 04/11/2017 19:39    Procedures Procedures (including critical care time)  Medications Ordered in ED Medications  rosuvastatin (CRESTOR) tablet 20 mg (not administered)  atomoxetine (STRATTERA) capsule 10 mg (not administered)  FLUoxetine (PROZAC) tablet 20 mg (not administered)  ibuprofen (ADVIL,MOTRIN) tablet 200 mg (not administered)  multivitamin with minerals tablet 1 tablet (not administered)  ondansetron (ZOFRAN) tablet 8 mg (not administered)  acetaminophen (TYLENOL) tablet 650 mg (not administered)    Or  acetaminophen (TYLENOL) solution 650 mg (not administered)    Or  acetaminophen (TYLENOL) suppository 650 mg (not administered)  aspirin suppository 300 mg ( Rectal See Alternative 04/11/17 2312)    Or  aspirin tablet 325 mg (325 mg Oral Given 04/11/17 2312)  ondansetron (ZOFRAN) injection 4 mg (not administered)  0.9 %  sodium chloride infusion (  Intravenous Duplicate 04/11/17 2312)  0.9 %  sodium chloride infusion (1,000 mLs Intravenous New Bag/Given 04/11/17 2312)  sodium chloride 0.9 % bolus 1,000 mL (0 mLs Intravenous Stopped 04/11/17 1929)  ondansetron (ZOFRAN) injection 4 mg (4 mg Intravenous Given  04/11/17 1748)  LORazepam (ATIVAN) injection 1 mg (1 mg Intravenous Given 04/11/17 1801)  meclizine (ANTIVERT) tablet 25 mg (25 mg Oral Given 04/11/17 1928)  iopamidol (ISOVUE-370) 76 % injection (50 mLs  Contrast Given 04/11/17 1945)   stroke: mapping our early stages of recovery book ( Does not apply Given 04/11/17 2313)     Initial Impression / Assessment and Plan / ED Course  I have reviewed the triage vital signs and the nursing notes.  Pertinent labs & imaging results that were available during my care of the patient were reviewed by me and considered in my medical decision making (see chart for details).  Clinical Course as of Apr 12 32  Fri Apr 11, 2017  1910 Discussed with Dr. Laurence SlateAroor.  Jake Moore recommends medical admission for stroke.  Also recommend CTA of the neck.  No interventions at this time.  [JR]  2039 Dr. Onalee Huaavid with [facility] accepting admission.  [JR]    Clinical Course User Index [JR] Dennie Moltz, [place], PA-C   Patient presenting with right-sided facial numbness, dizziness, vision changes, headache, worsening since last night.  Initial symptoms began with imbalance, nausea, dizziness yesterday morning.  Symptoms acutely worsened around 11 PM last night.  On exam patient with rotary nystagmus at rest, with rightward gaze.  Pupils are nonconsensual, searching on the right upper extremity finger to nose.  Normal strength in all extremities.  MRI of the brain showing right cerebellar and medullary infarct.  Patient discussed with Dr. Laurence SlateAroor, who recommends medical admission and CTA of the neck.  CTA of the neck showing occlusion of the right vertebral artery and right PICA.  Discussed results and answer questions the best my ability.  Patient evaluated by Dr. Laurence SlateAroor, not recommending medical interventions at this time.  Dr. Onalee Huaavid with [facility] accepting admission.  Patient discussed with Dr. Clayborne DanaMesner.  The patient appears reasonably stabilized for admission considering the current resources,  flow, and capabilities available in the ED at this time, and I doubt any other [facility] further screening and/or treatment in the ED prior to admission.  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

## 2017-04-12 ENCOUNTER — Other Ambulatory Visit (HOSPITAL_COMMUNITY): Payer: BLUE CROSS/BLUE SHIELD

## 2017-04-12 ENCOUNTER — Inpatient Hospital Stay (HOSPITAL_COMMUNITY): Payer: BLUE CROSS/BLUE SHIELD

## 2017-04-12 ENCOUNTER — Encounter (HOSPITAL_COMMUNITY): Payer: Self-pay | Admitting: General Practice

## 2017-04-12 ENCOUNTER — Other Ambulatory Visit: Payer: Self-pay

## 2017-04-12 DIAGNOSIS — I63011 Cerebral infarction due to thrombosis of right vertebral artery: Secondary | ICD-10-CM

## 2017-04-12 DIAGNOSIS — I63211 Cerebral infarction due to unspecified occlusion or stenosis of right vertebral arteries: Principal | ICD-10-CM

## 2017-04-12 DIAGNOSIS — G463 Brain stem stroke syndrome: Secondary | ICD-10-CM

## 2017-04-12 DIAGNOSIS — I361 Nonrheumatic tricuspid (valve) insufficiency: Secondary | ICD-10-CM

## 2017-04-12 LAB — ECHOCARDIOGRAM COMPLETE
CHL CUP RV SYS PRESS: 26 mmHg
CHL CUP TV REG PEAK VELOCITY: 238 cm/s
EERAT: 7.54
EWDT: 239 ms
FS: 28 % (ref 28–44)
HEIGHTINCHES: 70 in
IVS/LV PW RATIO, ED: 1
LA diam end sys: 30 mm
LA diam index: 1.56 cm/m2
LA vol A4C: 35.1 ml
LA vol: 39.3 mL
LASIZE: 30 mm
LAVOLIN: 20.5 mL/m2
LV TDI E'LATERAL: 11.9
LV TDI E'MEDIAL: 10.2
LVEEAVG: 7.54
LVEEMED: 7.54
LVELAT: 11.9 cm/s
LVOT VTI: 16.5 cm
LVOT area: 4.15 cm2
LVOT diameter: 23 mm
LVOTPV: 81.3 cm/s
LVOTSV: 68 mL
Lateral S' vel: 17.4 cm/s
MV Dec: 239
MV pk A vel: 50 m/s
MVPG: 3 mmHg
MVPKEVEL: 89.7 m/s
PW: 8 mm — AB (ref 0.6–1.1)
RV TAPSE: 28.5 mm
TR max vel: 238 cm/s
WEIGHTICAEL: 2638.47 [oz_av]

## 2017-04-12 LAB — LIPID PANEL
CHOL/HDL RATIO: 2 ratio
CHOLESTEROL: 141 mg/dL (ref 0–200)
HDL: 71 mg/dL (ref >40)
LDL Cholesterol: 57 mg/dL (ref 0–99)
Triglycerides: 65 mg/dL (ref <150)
VLDL: 13 mg/dL (ref 0–40)

## 2017-04-12 LAB — HEMOGLOBIN A1C
HEMOGLOBIN A1C: 5.2 % (ref 4.8–5.6)
MEAN PLASMA GLUCOSE: 102.54 mg/dL

## 2017-04-12 LAB — HIV ANTIBODY (ROUTINE TESTING W REFLEX): HIV Screen 4th Generation wRfx: NONREACTIVE

## 2017-04-12 LAB — MRSA PCR SCREENING: MRSA BY PCR: NEGATIVE

## 2017-04-12 LAB — HEPARIN LEVEL (UNFRACTIONATED): HEPARIN UNFRACTIONATED: 0.34 [IU]/mL (ref 0.30–0.70)

## 2017-04-12 MED ORDER — HEPARIN (PORCINE) IN NACL 100-0.45 UNIT/ML-% IJ SOLN
1050.0000 [IU]/h | INTRAMUSCULAR | Status: DC
  Administered 2017-04-12: 1000 [IU]/h via INTRAVENOUS
  Administered 2017-04-13: 1100 [IU]/h via INTRAVENOUS
  Administered 2017-04-14: 1050 [IU]/h via INTRAVENOUS
  Filled 2017-04-12 (×4): qty 250

## 2017-04-12 MED ORDER — GADOBENATE DIMEGLUMINE 529 MG/ML IV SOLN
15.0000 mL | Freq: Once | INTRAVENOUS | Status: DC
Start: 2017-04-12 — End: 2017-04-17

## 2017-04-12 MED ORDER — LORAZEPAM 2 MG/ML IJ SOLN
1.0000 mg | Freq: Once | INTRAMUSCULAR | Status: AC
  Administered 2017-04-12: 1 mg via INTRAVENOUS

## 2017-04-12 MED ORDER — LORAZEPAM 2 MG/ML IJ SOLN
INTRAMUSCULAR | Status: AC
  Administered 2017-04-12: 1 mg via INTRAVENOUS
  Filled 2017-04-12: qty 1

## 2017-04-12 MED ORDER — GADOBENATE DIMEGLUMINE 529 MG/ML IV SOLN
15.0000 mL | Freq: Once | INTRAVENOUS | Status: AC
  Administered 2017-04-12: 15 mL via INTRAVENOUS

## 2017-04-12 NOTE — Progress Notes (Signed)
Patient says he is having new symptoms of tingling in right hand notified [facility] attending he requested that neurologist come up to assess patient, there are new orders for another MRA, no other new symptoms.

## 2017-04-12 NOTE — Progress Notes (Signed)
PROGRESS NOTE    Jake Moore  ZOX:[id number] DOB: 05-07-1975 DOA: 04/11/2017 PCP: Martha Clan, MD   Chief Complaint  Patient presents with  . Dizziness  . Nausea    Brief Narrative:  41 year old with history of anxiety, hyperlipidemia, presented with one-day of dizziness and worsening right-sided facial numbness. Patient had come to the ED, found to have normal CT scan and given his symptoms with improvement of his dizziness thought to have vertigo, inner ear problems and was sent home. Patient then started having numbness of his right nostril and perioral area. He presented back to the emergency department was found to have a CTA showing occlusion of the right vertebral artery. MRI showing acute CVA. Around 5 AM this morning, patient started having numbness of his left upper extremity as well as left foot. Patient was seen by neurology, placed on heparin drip and sent to the ICU.  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 -MRI brain showing acute nonhemorrhagic subcentimeter right inferior cerebellar and right medullary infarcts. Right vertebral artery and right PICA occlusion. -MRA of the neck ordered and pending -Neurology consulted and appreciated, has now been placed on heparin drip for concern of dissection -Vascular surgery consulted and appreciated -Echocardiogram pending -Hemoglobin A1c 5.2, LDL 57 -Anticoagulation workup pending. Patient does have family history of clotting disorder -Cardiology consulted appreciated, planned for TEE -Continue statin, heparin drip -patient now under care of neurology and PCCM teams (currently ICU).   Hyperlipidemia -Continue statin  Anxiety/depression -Continue Prozac  DVT Prophylaxis  heparin  Code Status: Full  Family Communication: Family at bedside  Disposition Plan: Admitted,  currently ICU.   Consultants Neurology [facility] Cardiology Vascular surgery  Procedures  None  Antibiotics   Anti-infectives (From admission, onward)   None      Subjective:   Jake Moore seen and examined today.  Continues to have right facial numbness. Left arm and foot numbness started earlier this morning at approximate 5 AM. Denies current chest pain, shortness breath, abdominal pain, nausea vomiting, diarrhea constipation.  Objective:   Vitals:   04/12/17 1100 04/12/17 1130 04/12/17 1200 04/12/17 1230  BP: (!) 139/106 (!) 142/92 135/87 (!) 132/99  Pulse: 87 74 72 76  Resp: 14 14 14 14   Temp:   98.3 F (36.8 C)   TempSrc:   Oral   SpO2: 100% 98% 97% 97%  Weight:      Height:        Intake/Output Summary (Last 24 hours) at 04/12/2017 1346 Last data filed at 04/12/2017 1200 Gross per 24 hour  Intake 1994.34 ml  Output 850 ml  Net 1144.34 ml   Filed Weights   04/11/17 1707 04/11/17 2227  Weight: 74.8 kg (165 lb) 74.8 kg (164 lb 14.5 oz)    Exam  General: Well developed, well nourished, NAD, appears stated age  HEENT: NCAT,  mucous membranes moist.   Cardiovascular: S1 S2 auscultated, no rubs, murmurs or gallops. Regular rate and rhythm.  Respiratory: Clear to auscultation bilaterally with equal chest rise  Abdomen: Soft, nontender, nondistended, + bowel sounds  Extremities: warm dry without cyanosis clubbing or edema  Neuro: AAOx3, strength 5/5, nonfocal.   Psych: Anxious, however, appropriate   Data Reviewed: I have personally reviewed following labs and imaging studies  CBC: Recent Labs  Lab 04/10/17 1430 04/11/17 1910  WBC 6.1  8.3  NEUTROABS 4.4 6.5  HGB 15.4 14.0  HCT 44.0 41.5  MCV 90.3 91.8  PLT 159 153   Basic Metabolic Panel: Recent Labs  Lab 04/10/17 1430 04/11/17 1910  NA 140 139  K 4.4 4.3  CL 106 111  CO2 26 24  GLUCOSE 144* 120*  BUN 9 8  CREATININE 0.95 0.92  CALCIUM 9.4 8.4*   GFR: Estimated Creatinine  Clearance: 110.2 mL/min (by C-G formula based on SCr of 0.92 mg/dL). Liver Function Tests: Recent Labs  Lab 04/11/17 1910  AST 21  ALT 20  ALKPHOS 49  BILITOT 0.6  PROT 5.7*  ALBUMIN 3.6   No results for input(s): LIPASE, AMYLASE in the last 168 hours. No results for input(s): AMMONIA in the last 168 hours. Coagulation Profile: No results for input(s): INR, PROTIME in the last 168 hours. Cardiac Enzymes: No results for input(s): CKTOTAL, CKMB, CKMBINDEX, TROPONINI in the last 168 hours. BNP (last 3 results) No results for input(s): PROBNP in the last 8760 hours. HbA1C: Recent Labs    04/12/17 0254  HGBA1C 5.2   CBG: No results for input(s): GLUCAP in the last 168 hours. Lipid Profile: Recent Labs    04/12/17 0254  CHOL 141  HDL 71  LDLCALC 57  TRIG 65  CHOLHDL 2.0   Thyroid Function Tests: No results for input(s): TSH, T4TOTAL, FREET4, T3FREE, THYROIDAB in the last 72 hours. Anemia Panel: No results for input(s): VITAMINB12, FOLATE, FERRITIN, TIBC, IRON, RETICCTPCT in the last 72 hours. Urine analysis: No results found for: COLORURINE, APPEARANCEUR, LABSPEC, PHURINE, GLUCOSEU, HGBUR, BILIRUBINUR, KETONESUR, PROTEINUR, UROBILINOGEN, NITRITE, LEUKOCYTESUR Sepsis Labs: @LABRCNTIP (procalcitonin:4,lacticidven:4)  ) Recent Results (from the past 240 hour(s))  MRSA PCR Screening     Status: None   Collection Time: 04/12/17  9:57 AM  Result Value Ref Range Status   MRSA by PCR NEGATIVE NEGATIVE Final    Comment:        The GeneXpert MRSA Assay (FDA approved for NASAL specimens only), is one component of a comprehensive MRSA colonization surveillance program. It is not intended to diagnose MRSA infection nor to guide or monitor treatment for MRSA infections.       Radiology Studies: Ct Head Wo Contrast  Result Date: 04/10/2017 CLINICAL DATA:  Vertigo.  Posterior head pain. EXAM: CT HEAD WITHOUT CONTRAST TECHNIQUE: Contiguous axial images were obtained  from the base of the skull through the vertex without intravenous contrast. COMPARISON:  None. FINDINGS: Brain: No evidence of acute infarction, hemorrhage, hydrocephalus, extra-axial collection or mass lesion/mass effect. Vascular: No hyperdense vessel or unexpected calcification. Skull: Normal. Negative for fracture or focal lesion. Sinuses/Orbits: No acute finding. Other: None. IMPRESSION: Normal head CT. Electronically Signed   By: Lupita RaiderJames  Green Jr, M.D.   On: 04/10/2017 14:54   Ct Angio Neck W Or Wo Contrast  Result Date: 04/11/2017 CLINICAL DATA:  Initial evaluation for acute stroke. EXAM: CT ANGIOGRAPHY NECK TECHNIQUE: Multidetector CT imaging of the neck was performed using the standard protocol during bolus administration of intravenous contrast. Multiplanar CT image reconstructions and MIPs were obtained to evaluate the vascular anatomy. Carotid stenosis measurements (when applicable) are obtained utilizing NASCET criteria, using the distal internal carotid diameter as the denominator. CONTRAST:  <See Chart> ISOVUE-370 IOPAMIDOL (ISOVUE-370) INJECTION 76% COMPARISON:  Comparison made with prior MRI/ MRA from earlier the same day. FINDINGS: Aortic arch: Visualized aortic arch of normal caliber with normal branch pattern. No flow-limiting stenosis about the origin of the great vessels. Visualized subclavian arteries  widely patent without stenosis. Right carotid system: The right common carotid artery widely patent from its origin to the bifurcation without stenosis. No significant atheromatous plaque or narrowing about the right bifurcation. Right ICA widely patent from the bifurcation to the circle of Willis without stenosis, dissection, or occlusion. Left carotid system: Left common carotid artery patent from its origin to the bifurcation without stenosis. No significant atheromatous plaque or narrowing about the left bifurcation. Left ICA patent from the bifurcation to the circle of Willis without  stenosis, dissection, or occlusion. Vertebral arteries: Both of the vertebral arteries arise from the subclavian arteries. Left vertebral artery is dominant and widely patent to the vertebrobasilar junction without stenosis, dissection, or occlusion. Diminutive right vertebral artery demonstrates attenuated flow within the distal right V2 segment, and a essentially occludes at the V3 segment and is largely occluded at the level of the skullbase/cranial vault. No overt findings to suggest dissection or other abnormality. Minimal scant thready flow present within the right V4 segment, likely retrograde in nature from the basilar artery. Probable scant flow within the proximal right PICA, which is also essentially occluded. Skeleton: No acute osseus abnormality. No worrisome lytic or blastic osseous lesions. Small central disc protrusion with calcification present at C6-7 without stenosis. Other neck: No acute soft tissue abnormality within the neck. No adenopathy. Salivary glands normal. Thyroid normal. Upper chest: Visualized upper chest within normal limits. Visualized lungs are clear. IMPRESSION: 1. 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 Finding is of uncertain etiology, without overt evidence for dissection or other acute abnormality. Dominant left vertebral artery widely patent to the vertebrobasilar junction. 2. Otherwise normal CTA of the neck. Electronically Signed   By: Rise Mu M.D.   On: 04/11/2017 20:35   Mr Angiogram Head W Or Wo Contrast  Result Date: 04/11/2017 CLINICAL DATA:  Ataxia, stroke suspected, dizziness, RIGHT-sided facial numbness for 1 day. EXAM: MRI HEAD WITHOUT CONTRAST MRA HEAD WITHOUT CONTRAST TECHNIQUE: Multiplanar, multiecho pulse sequences of the brain and surrounding structures were obtained without intravenous contrast. Angiographic images of the head were obtained using MRA technique without contrast. COMPARISON:  Normal CT head  performed 04/10/2017. FINDINGS: MRI HEAD FINDINGS Brain: Multiple areas of restricted diffusion, corresponding low ADC, subcentimeter in size, affect the RIGHT inferior cerebellum and dorsolateral medulla consistent with an acute RIGHT PICA territory infarct. No visible hemorrhage, mass lesion, hydrocephalus, or extra-axial fluid. Normal for age cerebral volume. No significant white matter disease. Vascular: Absent flow related enhancement in the non dominant RIGHT vertebral artery. Other flow voids are preserved. Skull and upper cervical spine: Normal marrow signal. Sinuses/Orbits: Unremarkable. Other: None. MRA HEAD FINDINGS The internal carotid arteries are widely patent throughout their cervical, petrous, cavernous, supraclinoid, and terminal segments. Normal-appearing anterior and middle cerebral artery segments bilaterally. Basilar artery is dolichoectatic but widely patent, with the LEFT vertebral is the sole contributor. There is segmental, and thread-like flow related enhancement, essentially thrombosis, of the visualized V4 segment of the RIGHT vertebral artery. The distal V4 RIGHT vertebral artery is visualized, likely retrograde from the basilar. In addition, there is diminished to absent flow related enhancement in the RIGHT PICA. The other cerebellar branches are preserved.  No saccular aneurysm. 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 Findings discussed with ordering provider. Electronically Signed   By: Elsie Stain M.D.   On: 04/11/2017 19:39   Mr Maxine Glenn Neck  W Wo Contrast  Result Date: 04/12/2017 CLINICAL DATA:  Continued surveillance of stroke. RIGHT vertebral occlusion. EXAM: MRI HEAD WITHOUT CONTRAST MRA NECK WITHOUT AND WITH CONTRAST TECHNIQUE: Multiplanar, multiecho pulse sequences of the brain and surrounding structures were obtained without intravenous  contrast. Angiographic images of the neck were obtained using MRA technique with and without intravenous contrast. Carotid stenosis measurements (when applicable) are obtained utilizing NASCET criteria, using the distal internal carotid diameter as the denominator. CONTRAST:  15mL MULTIHANCE GADOBENATE DIMEGLUMINE 529 MG/ML IV SOLN COMPARISON:  MR brain 04/11/2017.  CTA head neck 04/11/2017. FINDINGS: MRI HEAD FINDINGS Brain: Unchanged subcentimeter foci of restricted diffusion, RIGHT brainstem and inferior cerebellum, consistent with multifocal infarction in the setting of RIGHT vertebral and RIGHT PICA occlusion. No new areas of concern. Vascular: See below. Skull and upper cervical spine: Normal marrow signal. Sinuses/Orbits: Unremarkable. Other: Incidental note is made of medial positioning of the RIGHT vocal cord. This is likely the result of the lateral medullary infarct. MRA NECK FINDINGS No great vessel stenosis.  Normal transverse arch.  Bovine trunk. Normal BILATERAL common carotid arteries. Normal BILATERAL carotid bifurcations. No ICA dissection. No changes of fibromuscular dysplasia. Normal BILATERAL subclavian arteries.  Normal vertebral origins. LEFT vertebral dominant and widely patent through the neck. RIGHT vertebral in the neck is diminutive, related to the V3 occlusion at the C1 level. No definitive evidence pointing to dissection is observed, such as fibromuscular disease, or blood products surrounding the vessel; nevertheless, dissection appears to be the most likely etiology. IMPRESSION: Unchanged subcentimeter foci of restricted diffusion consistent with acute nonhemorrhagic infarction, involving the brainstem and inferior cerebellum, RIGHT PICA territory. 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 Of note, a history of neck pain was elicited by the neurologist.  Hypercoagulable state resulting in thrombosis is also a consideration, and appropriate lab panel is pending. Except for the RIGHT vertebral occlusion, unremarkable appearing extracranial circulation. Electronically Signed   By: Elsie StainJohn T Curnes M.D.   On: 04/12/2017 13:44   Mr Brain Wo Contrast  Result Date: 04/12/2017 CLINICAL DATA:  Continued surveillance of stroke. RIGHT vertebral occlusion. EXAM: MRI HEAD WITHOUT CONTRAST MRA NECK WITHOUT AND WITH CONTRAST TECHNIQUE: Multiplanar, multiecho pulse sequences of the brain and surrounding structures were obtained without intravenous contrast. Angiographic images of the neck were obtained using MRA technique with and without intravenous contrast. Carotid stenosis measurements (when applicable) are obtained utilizing NASCET criteria, using the distal internal carotid diameter as the denominator. CONTRAST:  15mL MULTIHANCE GADOBENATE DIMEGLUMINE 529 MG/ML IV SOLN COMPARISON:  MR brain 04/11/2017.  CTA head neck 04/11/2017. FINDINGS: MRI HEAD FINDINGS Brain: Unchanged subcentimeter foci of restricted diffusion, RIGHT brainstem and inferior cerebellum, consistent with multifocal infarction in the setting of RIGHT vertebral and RIGHT PICA occlusion. No new areas of concern. Vascular: See below. Skull and upper cervical spine: Normal marrow signal. Sinuses/Orbits: Unremarkable. Other: Incidental note is made of medial positioning of the RIGHT vocal cord. This is likely the result of the lateral medullary infarct. MRA NECK FINDINGS No great vessel stenosis.  Normal transverse arch.  Bovine trunk. Normal BILATERAL common carotid arteries. Normal BILATERAL carotid bifurcations. No ICA dissection. No changes of fibromuscular dysplasia. Normal BILATERAL subclavian arteries.  Normal vertebral origins. LEFT vertebral dominant and widely patent through the neck. RIGHT vertebral in the neck is diminutive, related to the V3 occlusion at the C1 level. No definitive evidence  pointing  to dissection is observed, such as fibromuscular disease, or blood products surrounding the vessel; nevertheless, dissection appears to be the most likely etiology. IMPRESSION: Unchanged subcentimeter foci of restricted diffusion consistent with acute nonhemorrhagic infarction, involving the brainstem and inferior cerebellum, RIGHT PICA territory. 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 Of note, a history of neck pain was elicited by the neurologist. Hypercoagulable state resulting in thrombosis is also a consideration, and appropriate lab panel is pending. Except for the RIGHT vertebral occlusion, unremarkable appearing extracranial circulation. Electronically Signed   By: Elsie Stain M.D.   On: 04/12/2017 13:44   Mr Brain Wo Contrast  Result Date: 04/11/2017 CLINICAL DATA:  Ataxia, stroke suspected, dizziness, RIGHT-sided facial numbness for 1 day. EXAM: MRI HEAD WITHOUT CONTRAST MRA HEAD WITHOUT CONTRAST TECHNIQUE: Multiplanar, multiecho pulse sequences of the brain and surrounding structures were obtained without intravenous contrast. Angiographic images of the head were obtained using MRA technique without contrast. COMPARISON:  Normal CT head performed 04/10/2017. FINDINGS: MRI HEAD FINDINGS Brain: Multiple areas of restricted diffusion, corresponding low ADC, subcentimeter in size, affect the RIGHT inferior cerebellum and dorsolateral medulla consistent with an acute RIGHT PICA territory infarct. No visible hemorrhage, mass lesion, hydrocephalus, or extra-axial fluid. Normal for age cerebral volume. No significant white matter disease. Vascular: Absent flow related enhancement in the non dominant RIGHT vertebral artery. Other flow voids are preserved. Skull and upper cervical spine: Normal marrow signal. Sinuses/Orbits: Unremarkable. Other: None. MRA HEAD FINDINGS The internal  carotid arteries are widely patent throughout their cervical, petrous, cavernous, supraclinoid, and terminal segments. Normal-appearing anterior and middle cerebral artery segments bilaterally. Basilar artery is dolichoectatic but widely patent, with the LEFT vertebral is the sole contributor. There is segmental, and thread-like flow related enhancement, essentially thrombosis, of the visualized V4 segment of the RIGHT vertebral artery. The distal V4 RIGHT vertebral artery is visualized, likely retrograde from the basilar. In addition, there is diminished to absent flow related enhancement in the RIGHT PICA. The other cerebellar branches are preserved.  No saccular aneurysm. 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 Findings discussed with ordering provider. Electronically Signed   By: Elsie Stain M.D.   On: 04/11/2017 19:39     Scheduled Meds: . atomoxetine  10 mg Oral Daily  . FLUoxetine  20 mg Oral Daily  . gadobenate dimeglumine  15 mL Intravenous Once  . multivitamin with minerals  1 tablet Oral Daily  . rosuvastatin  20 mg Oral q1800   Continuous Infusions: . sodium chloride    . sodium chloride 75 mL/hr at 04/12/17 1200  . heparin 1,000 Units/hr (04/12/17 1200)     LOS: 1 day   Time Spent in minutes   45 minutes  Jadavion Spoelstra D.O. on 04/12/2017 at 1:46 PM  Between 7am to 7pm - Pager - [phone]  After 7pm go to [email or web address] - password [email or web address]  And look for the night coverage person covering for me after hours  [facility] Office  [phone]

## 2017-04-12 NOTE — Consult Note (Signed)
Cardiology Consult Note   Referring Physician: Catha Gosselin Primary Cardiologist:  Shirlee Latch  Reason for Consultation: Acute CVA  HPI:    Jake Moore is seen today for evaluation of acute CVA at the request of Dr. Aquilla Hacker Jake Moore is a 41 y.o. male with h/o ADD but no other significant PMHx.   He has a family history of hypercoagulable disorder.  He has a sister with MI at an early age. 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  His grandfather also had early cardiac disease.  He had a cardiac CTA in March 2018 for CP with normal coronary arteries. 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  He has been taking Crestor for hypercholesterolemia.  He has not been taking an aspirin.  He was seen in the emergency department 2 days with dizziness and nausea and vomiting and gait imbalance.  His workup consisted of a head CT scan which was negative for acute pathology.  He was diagnosed with peripheral vertigo and sent home.  He then later developed new symptoms consisting of right-sided facial numbness and double vision. 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 He was seen by Neurology and admitted.  Overnight he had tingling in his face and left arm and moved to Neuro ICU. Repeat MRI/MRA unchanged. He was started on heparin.   He was seen by Dr. Myra Gianotti in [facility] who did not feel that he had a dissection of his right vertebral artery and suspected an embolic event.   Echo done this afternoon (viewed personally) LVEF 55-60% trivial AI. RV normal   He denies h/o palpitations or previous clotting events. His wife says he snores when he sleeps on his back. No excess ETOH. He has a h/o tobacco use but quit about a year  and a half ago.   Review of Systems: [y] = yes, [ ]  = no   General: Weight gain [ ] ; Weight loss [ ] ; Anorexia [ ] ; Fatigue [ ] ; Fever [ ] ; Chills [ ] ; Weakness [ ]   Cardiac: Chest pain/pressure [ ] ; Resting SOB [ ] ; Exertional SOB [ ] ; Orthopnea [ ] ; Pedal Edema [ ] ; Palpitations [ ] ; Syncope [ ] ; Presyncope [ ] ; Paroxysmal nocturnal dyspnea[ ]   Pulmonary: Cough [ ] ; Wheezing[ ] ; Hemoptysis[ ] ; Sputum [ ] ; Snoring [ ]   GI: Vomiting[ ] ; Dysphagia[ ] ; Melena[ ] ; Hematochezia [ ] ; Heartburn[ ] ; Abdominal pain [ ] ; Constipation [ ] ; Diarrhea [ ] ; BRBPR [ ]   GU: Hematuria[ ] ; Dysuria [ ] ; Nocturia[ ]   Vascular: Pain in legs with walking [ ] ; Pain in feet with lying flat [ ] ; Non-healing sores [ ] ; Stroke Cove.Etienne ]; TIA [ ] ; Slurred speech [ ] ;  Neuro: Headaches[ ] ; Vertigo[ ] ; Seizures[ ] ; Paresthesias[ ] ;Blurred vision [ ] ; Diplopia Cove.Etienne ]; Vision changes [ y]  Ortho/Skin: Arthritis [ ] ; Joint pain [ ] ; Muscle pain [ ] ; Joint swelling [ ] ; Back Pain [ ] ; Rash [ ]   Psych: Depression[ ] ; Anxiety[y ]  Heme: Bleeding problems [ ] ; Clotting disorders [ ] ; Anemia [ ]   Endocrine: Diabetes [ ] ; Thyroid dysfunction[ ]    Current Meds:  . atomoxetine  10 mg Oral Daily  . FLUoxetine  20 mg Oral Daily  .  gadobenate dimeglumine  15 mL Intravenous Once  . multivitamin with minerals  1 tablet Oral Daily  . rosuvastatin  20 mg Oral q1800     Past Medical History: Past Medical History:  Diagnosis Date  . Anxiety     Past Surgical History: History reviewed. No pertinent surgical history.  Family History: As per HPI. Sister with Mi from hypercoaguable d/o  Social History: Social History   Socioeconomic History  . Marital status: Single    Spouse name: None  . Number of children: None  . Years of education: None  . Highest education level: None  Social Needs  . Financial resource strain: None  . Food insecurity - worry: None  . Food insecurity - inability: None  . Transportation needs - medical:  None  . Transportation needs - non-medical: None  Occupational History  . None  Tobacco Use  . Smoking status: Former Smoker    Years: 20.00    Types: Cigarettes    Last attempt to quit: 01/15/2016    Years since quitting: 1.2  . Smokeless tobacco: Former NeurosurgeonUser    Types: Chew  Substance and Sexual Activity  . Alcohol use: Yes    Alcohol/week: 12.0 oz    Types: 20 Cans of beer per week    Comment: occasonal  . Drug use: No  . Sexual activity: None  Other Topics Concern  . None  Social History Narrative  . None    Allergies:  Allergies  Allergen Reactions  . Penicillins Hives    Has patient had a PCN reaction causing immediate rash, facial/tongue/throat swelling, SOB or lightheadedness with hypotension: YES Has patient had a PCN reaction causing severe rash involving mucus membranes or skin necrosis: NO Has patient had a PCN reaction that required hospitalizationNO Has patient had a PCN reaction occurring within the last 10 years: NO If all of the above answers are "NO", then may proceed with Cephalosporin use.    Objective:    Vital Signs:   Temp:  [97.3 F (36.3 C)-98.7 F (37.1 C)] 98.3 F (36.8 C) (12/22 1200) Pulse Rate:  [66-87] 74 (12/22 1700) Resp:  [12-21] 14 (12/22 1700) BP: (114-143)/(77-110) 138/99 (12/22 1700) SpO2:  [95 %-100 %] 97 % (12/22 1700) FiO2 (%):  [21 %] 21 % (12/21 2118) Weight:  [74.8 kg (164 lb 14.5 oz)] 74.8 kg (164 lb 14.5 oz) (12/21 2227) Last BM Date: 04/09/17  Weight change: Filed Weights   04/11/17 1707 04/11/17 2227  Weight: 74.8 kg (165 lb) 74.8 kg (164 lb 14.5 oz)    Intake/Output:   Intake/Output Summary (Last 24 hours) at 04/12/2017 1834 Last data filed at 04/12/2017 1700 Gross per 24 hour  Intake 2419.34 ml  Output 1575 ml  Net 844.34 ml      Physical Exam    General:  Well appearing. No resp difficulty HEENT: normal except for dysconjugate gaze Neck: supple. JVP flat . Carotids 2+ bilat; no bruits. No  lymphadenopathy or thyromegaly appreciated. Cor: PMI nondisplaced. Regular rate & rhythm. No rubs, gallops or murmurs. Lungs: clear Abdomen: soft, nontender, nondistended. No hepatosplenomegaly. No bruits or masses. Good bowel sounds. Extremities: no cyanosis, clubbing, rash, edema Neuro: alert & orientedx3, dysconjugate gaze  moves all 4 extremities w/o difficulty. Ataxia fith FTN Affect pleasant   Telemetry    SR 70s Personally reviewed   EKG    NSR 90 No ST-T wave abnormalities. Personally reviewed   Labs   Basic Metabolic Panel: Recent Labs  Lab  04/10/17 1430 04/11/17 1910  NA 140 139  K 4.4 4.3  CL 106 111  CO2 26 24  GLUCOSE 144* 120*  BUN 9 8  CREATININE 0.95 0.92  CALCIUM 9.4 8.4*    Liver Function Tests: Recent Labs  Lab 04/11/17 1910  AST 21  ALT 20  ALKPHOS 49  BILITOT 0.6  PROT 5.7*  ALBUMIN 3.6   No results for input(s): LIPASE, AMYLASE in the last 168 hours. No results for input(s): AMMONIA in the last 168 hours.  CBC: Recent Labs  Lab 04/10/17 1430 04/11/17 1910  WBC 6.1 8.3  NEUTROABS 4.4 6.5  HGB 15.4 14.0  HCT 44.0 41.5  MCV 90.3 91.8  PLT 159 153    Cardiac Enzymes: No results for input(s): CKTOTAL, CKMB, CKMBINDEX, TROPONINI in the last 168 hours.  BNP: BNP (last 3 results) No results for input(s): BNP in the last 8760 hours.  ProBNP (last 3 results) No results for input(s): PROBNP in the last 8760 hours.   CBG: No results for input(s): GLUCAP in the last 168 hours.  Coagulation Studies: No results for input(s): LABPROT, INR in the last 72 hours.   Imaging   Ct Angio Neck W Or Wo Contrast  Result Date: 04/11/2017 CLINICAL DATA:  Initial evaluation for acute stroke. EXAM: CT ANGIOGRAPHY NECK TECHNIQUE: Multidetector CT imaging of the neck was performed using the standard protocol during bolus administration of intravenous contrast. Multiplanar CT image reconstructions and MIPs were obtained to evaluate the  vascular anatomy. Carotid stenosis measurements (when applicable) are obtained utilizing NASCET criteria, using the distal internal carotid diameter as the denominator. CONTRAST:  <See Chart> ISOVUE-370 IOPAMIDOL (ISOVUE-370) INJECTION 76% COMPARISON:  Comparison made with prior MRI/ MRA from earlier the same day. FINDINGS: Aortic arch: Visualized aortic arch of normal caliber with normal branch pattern. No flow-limiting stenosis about the origin of the great vessels. Visualized subclavian arteries widely patent without stenosis. Right carotid system: The right common carotid artery widely patent from its origin to the bifurcation without stenosis. No significant atheromatous plaque or narrowing about the right bifurcation. Right ICA widely patent from the bifurcation to the circle of Willis without stenosis, dissection, or occlusion. Left carotid system: Left common carotid artery patent from its origin to the bifurcation without stenosis. No significant atheromatous plaque or narrowing about the left bifurcation. Left ICA patent from the bifurcation to the circle of Willis without stenosis, dissection, or occlusion. Vertebral arteries: Both of the vertebral arteries arise from the subclavian arteries. Left vertebral artery is dominant and widely patent to the vertebrobasilar junction without stenosis, dissection, or occlusion. Diminutive right vertebral artery demonstrates attenuated flow within the distal right V2 segment, and a essentially occludes at the V3 segment and is largely occluded at the level of the skullbase/cranial vault. No overt findings to suggest dissection or other abnormality. Minimal scant thready flow present within the right V4 segment, likely retrograde in nature from the basilar artery. Probable scant flow within the proximal right PICA, which is also essentially occluded. Skeleton: No acute osseus abnormality. No worrisome lytic or blastic osseous lesions. Small central disc protrusion  with calcification present at C6-7 without stenosis. Other neck: No acute soft tissue abnormality within the neck. No adenopathy. Salivary glands normal. Thyroid normal. Upper chest: Visualized upper chest within normal limits. Visualized lungs are clear. IMPRESSION: 1. 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 Finding is of uncertain etiology, without overt evidence for dissection or other acute  abnormality. Dominant left vertebral artery widely patent to the vertebrobasilar junction. 2. Otherwise normal CTA of the neck. Electronically Signed   By: Rise Mu M.D.   On: 04/11/2017 20:35   Mr Angiogram Head W Or Wo Contrast  Result Date: 04/11/2017 CLINICAL DATA:  Ataxia, stroke suspected, dizziness, RIGHT-sided facial numbness for 1 day. EXAM: MRI HEAD WITHOUT CONTRAST MRA HEAD WITHOUT CONTRAST TECHNIQUE: Multiplanar, multiecho pulse sequences of the brain and surrounding structures were obtained without intravenous contrast. Angiographic images of the head were obtained using MRA technique without contrast. COMPARISON:  Normal CT head performed 04/10/2017. FINDINGS: MRI HEAD FINDINGS Brain: Multiple areas of restricted diffusion, corresponding low ADC, subcentimeter in size, affect the RIGHT inferior cerebellum and dorsolateral medulla consistent with an acute RIGHT PICA territory infarct. No visible hemorrhage, mass lesion, hydrocephalus, or extra-axial fluid. Normal for age cerebral volume. No significant white matter disease. Vascular: Absent flow related enhancement in the non dominant RIGHT vertebral artery. Other flow voids are preserved. Skull and upper cervical spine: Normal marrow signal. Sinuses/Orbits: Unremarkable. Other: None. MRA HEAD FINDINGS The internal carotid arteries are widely patent throughout their cervical, petrous, cavernous, supraclinoid, and terminal segments. Normal-appearing anterior and middle cerebral artery segments bilaterally. Basilar  artery is dolichoectatic but widely patent, with the LEFT vertebral is the sole contributor. There is segmental, and thread-like flow related enhancement, essentially thrombosis, of the visualized V4 segment of the RIGHT vertebral artery. The distal V4 RIGHT vertebral artery is visualized, likely retrograde from the basilar. In addition, there is diminished to absent flow related enhancement in the RIGHT PICA. The other cerebellar branches are preserved.  No saccular aneurysm. 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 Findings discussed with ordering provider. Electronically Signed   By: Elsie Stain M.D.   On: 04/11/2017 19:39   Mr Maxine Glenn Neck W Wo Contrast  Result Date: 04/12/2017 CLINICAL DATA:  Continued surveillance of stroke. RIGHT vertebral occlusion. EXAM: MRI HEAD WITHOUT CONTRAST MRA NECK WITHOUT AND WITH CONTRAST TECHNIQUE: Multiplanar, multiecho pulse sequences of the brain and surrounding structures were obtained without intravenous contrast. Angiographic images of the neck were obtained using MRA technique with and without intravenous contrast. Carotid stenosis measurements (when applicable) are obtained utilizing NASCET criteria, using the distal internal carotid diameter as the denominator. CONTRAST:  15mL MULTIHANCE GADOBENATE DIMEGLUMINE 529 MG/ML IV SOLN COMPARISON:  MR brain 04/11/2017.  CTA head neck 04/11/2017. FINDINGS: MRI HEAD FINDINGS Brain: Unchanged subcentimeter foci of restricted diffusion, RIGHT brainstem and inferior cerebellum, consistent with multifocal infarction in the setting of RIGHT vertebral and RIGHT PICA occlusion. No new areas of concern. Vascular: See below. Skull and upper cervical spine: Normal marrow signal. Sinuses/Orbits: Unremarkable. Other: Incidental note is made of medial positioning of the RIGHT vocal cord. This is likely the result  of the lateral medullary infarct. MRA NECK FINDINGS No great vessel stenosis.  Normal transverse arch.  Bovine trunk. Normal BILATERAL common carotid arteries. Normal BILATERAL carotid bifurcations. No ICA dissection. No changes of fibromuscular dysplasia. Normal BILATERAL subclavian arteries.  Normal vertebral origins. LEFT vertebral dominant and widely patent through the neck. RIGHT vertebral in the neck is diminutive, related to the V3 occlusion at the C1 level. No definitive evidence pointing to dissection is observed, such as fibromuscular disease, or blood products surrounding the vessel; nevertheless, dissection appears to be the most likely etiology. IMPRESSION: Unchanged subcentimeter foci of restricted diffusion consistent with acute nonhemorrhagic infarction,  involving the brainstem and inferior cerebellum, RIGHT PICA territory. 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 Of note, a history of neck pain was elicited by the neurologist. Hypercoagulable state resulting in thrombosis is also a consideration, and appropriate lab panel is pending. Except for the RIGHT vertebral occlusion, unremarkable appearing extracranial circulation. Electronically Signed   By: Elsie Stain M.D.   On: 04/12/2017 13:44   Mr Brain Wo Contrast  Result Date: 04/12/2017 CLINICAL DATA:  Continued surveillance of stroke. RIGHT vertebral occlusion. EXAM: MRI HEAD WITHOUT CONTRAST MRA NECK WITHOUT AND WITH CONTRAST TECHNIQUE: Multiplanar, multiecho pulse sequences of the brain and surrounding structures were obtained without intravenous contrast. Angiographic images of the neck were obtained using MRA technique with and without intravenous contrast. Carotid stenosis measurements (when applicable) are obtained utilizing NASCET criteria, using the distal internal carotid diameter as the denominator.  CONTRAST:  15mL MULTIHANCE GADOBENATE DIMEGLUMINE 529 MG/ML IV SOLN COMPARISON:  MR brain 04/11/2017.  CTA head neck 04/11/2017. FINDINGS: MRI HEAD FINDINGS Brain: Unchanged subcentimeter foci of restricted diffusion, RIGHT brainstem and inferior cerebellum, consistent with multifocal infarction in the setting of RIGHT vertebral and RIGHT PICA occlusion. No new areas of concern. Vascular: See below. Skull and upper cervical spine: Normal marrow signal. Sinuses/Orbits: Unremarkable. Other: Incidental note is made of medial positioning of the RIGHT vocal cord. This is likely the result of the lateral medullary infarct. MRA NECK FINDINGS No great vessel stenosis.  Normal transverse arch.  Bovine trunk. Normal BILATERAL common carotid arteries. Normal BILATERAL carotid bifurcations. No ICA dissection. No changes of fibromuscular dysplasia. Normal BILATERAL subclavian arteries.  Normal vertebral origins. LEFT vertebral dominant and widely patent through the neck. RIGHT vertebral in the neck is diminutive, related to the V3 occlusion at the C1 level. No definitive evidence pointing to dissection is observed, such as fibromuscular disease, or blood products surrounding the vessel; nevertheless, dissection appears to be the most likely etiology. IMPRESSION: Unchanged subcentimeter foci of restricted diffusion consistent with acute nonhemorrhagic infarction, involving the brainstem and inferior cerebellum, RIGHT PICA territory. 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 Of note, a history of neck pain was elicited by the neurologist. Hypercoagulable state resulting in thrombosis is also a consideration, and appropriate lab panel is pending. Except for the RIGHT vertebral occlusion, unremarkable appearing extracranial circulation. Electronically Signed   By: Elsie Stain M.D.   On: 04/12/2017 13:44    Mr Brain Wo Contrast  Result Date: 04/11/2017 CLINICAL DATA:  Ataxia, stroke suspected, dizziness, RIGHT-sided facial numbness for 1 day. EXAM: MRI HEAD WITHOUT CONTRAST MRA HEAD WITHOUT CONTRAST TECHNIQUE: Multiplanar, multiecho pulse sequences of the brain and surrounding structures were obtained without intravenous contrast. Angiographic images of the head were obtained using MRA technique without contrast. COMPARISON:  Normal CT head performed 04/10/2017. FINDINGS: MRI HEAD FINDINGS Brain: Multiple areas of restricted diffusion, corresponding low ADC, subcentimeter in size, affect the RIGHT inferior cerebellum and dorsolateral medulla consistent with an acute RIGHT PICA territory infarct. No visible hemorrhage, mass lesion, hydrocephalus, or extra-axial fluid. Normal for age cerebral volume. No significant white matter disease. Vascular: Absent flow related enhancement in the non dominant RIGHT vertebral artery. Other flow voids are preserved. Skull and upper cervical spine: Normal marrow signal. Sinuses/Orbits: Unremarkable. Other: None. MRA HEAD  FINDINGS The internal carotid arteries are widely patent throughout their cervical, petrous, cavernous, supraclinoid, and terminal segments. Normal-appearing anterior and middle cerebral artery segments bilaterally. Basilar artery is dolichoectatic but widely patent, with the LEFT vertebral is the sole contributor. There is segmental, and thread-like flow related enhancement, essentially thrombosis, of the visualized V4 segment of the RIGHT vertebral artery. The distal V4 RIGHT vertebral artery is visualized, likely retrograde from the basilar. In addition, there is diminished to absent flow related enhancement in the RIGHT PICA. The other cerebellar branches are preserved.  No saccular aneurysm. IMPRESSION: 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, related to distal RIGHT vertebral artery and RIGHT PICA  occlusion. Findings discussed with ordering provider. Electronically Signed   By: Elsie StainJohn T Curnes M.D.   On: 04/11/2017 19:39      Medications:     Current Medications: . atomoxetine  10 mg Oral Daily  . FLUoxetine  20 mg Oral Daily  . gadobenate dimeglumine  15 mL Intravenous Once  . multivitamin with minerals  1 tablet Oral Daily  . rosuvastatin  20 mg Oral q1800     Infusions: . sodium chloride    . sodium chloride 75 mL/hr at 04/12/17 1700  . heparin 1,000 Units/hr (04/12/17 1700)       Patient Profile   41 y/o male with ADD, elevated lipoprotein (a) and FHX of hypercoaguable d/o admitted with R cerebellar stroke  Assessment/Plan   1. Acute 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  - Appreciate [facility] and Radiology input, No clear evidence of vertebral artery dissection. Thus I suspect these are embolic in nature. Etiologies include PAF, intracardiac shunting or hypercoaguable d/o. Given his sister's history, the latter may be the most likely. - Hypercoaguable w/u underway. - Echo reviewed personally and no evidence of embolic source. - Will need TEE and ILR (we will arrange) - Continue anticoagulation and statin - We will follow closely.    Length of Stay: 1  Arvilla Meresaniel Bensimhon, MD  04/12/2017, 6:34 PM  Advanced Heart Failure Team Pager [phone] (M-F; 7a - 4p)  Please contact [facility] for night-coverage after hours (4p -7a ) and weekends on [email or web address]

## 2017-04-12 NOTE — Progress Notes (Signed)
ANTICOAGULATION CONSULT NOTE  Pharmacy Consult for heparin Indication: vertebral artery occlusion  Patient Measurements: Height: 5\' 10"  (177.8 cm) Weight: 164 lb 14.5 oz (74.8 kg) IBW/kg (Calculated) : 73 Heparin Dosing Weight: 74.8kg  Labs: Recent Labs    04/10/17 1430 04/11/17 1910 04/12/17 1456  HGB 15.4 14.0  --   HCT 44.0 41.5  --   PLT 159 153  --   HEPARINUNFRC  --   --  0.34  CREATININE 0.95 0.92  --    Estimated Creatinine Clearance: 110.2 mL/min (by C-G formula based on SCr of 0.92 mg/dL).  Assessment: [name] is a 41 y/o M who presented with worsening dizziness, gait imbalance, and progressive right-sided facial numbness. CTA shows occlusion of the right vertebral artery. Pt continue son IV heparin. Initial heparin level is at goal at 0.34. No bleeding noted.   Goal of Therapy:  Heparin level 0.3-0.5 Monitor platelets by anticoagulation protocol: Yes   Plan:  Continue heparin gtt 1000 units/hr F/u AM heparin level to confirm dosing Daily heparin level and CBC  Lysle Pearlachel Lamoine Magallon, PharmD, BCPS 04/12/2017 3:40 PM

## 2017-04-12 NOTE — Progress Notes (Signed)
ANTICOAGULATION CONSULT NOTE - Initial Consult  Pharmacy Consult for heparin Indication: vertebral artery occlusion  Patient Measurements: Height: 5\' 10"  (177.8 cm) Weight: 164 lb 14.5 oz (74.8 kg) IBW/kg (Calculated) : 73 Heparin Dosing Weight: 74.8kg  Labs: Recent Labs    04/10/17 1430 04/11/17 1910  HGB 15.4 14.0  HCT 44.0 41.5  PLT 159 153  CREATININE 0.95 0.92   Estimated Creatinine Clearance: 110.2 mL/min (by C-G formula based on SCr of 0.92 mg/dL).  Assessment: [name] is a 41 y/o M who presented with worsening dizziness, gait imbalance, and progressive right-sided facial numbness. CTA shows occlusion of the right vertebral artery. Heparin was started because he was having new symptoms (right hand tingling).  CBC stable, no bleeding. No bolus used and narrower heparin level goal recommended.   Goal of Therapy:  Heparin level 0.3-0.5 Monitor platelets by anticoagulation protocol: Yes   Plan:  Start heparin at 1000 units/hr HL at 1430 Daily HL, CBC, monitor for s/sx of bleeding [facility]   Nolen MuAustin J Kimberley Dastrup PharmD PGY1 Pharmacy Practice Resident 04/12/2017 8:36 AM Pager: [phone] Phone: [phone]

## 2017-04-12 NOTE — Progress Notes (Signed)
STROKE TEAM PROGRESS NOTE   HISTORY OF PRESENT ILLNESS (per record) Jake Moore is an 41 y.o. male with HLD, familyChristen Bame history significant for blood clots, and prior tobacco use presents to the emergency room after being discharged yesterday for worsening dizziness, gait imbalance nausea headache and progressive of right-sided facial numbness. He came with same symptoms yesterday that started at 8:30 in the morning. During that visit his CT head was done and patient was given meclizine. Reviewing of the note is unclear if the patient was made to walk prior to discharge. Patient states that symptoms worsened yesterday at 11 PM and numbness over his face extended from nose to the right half of his face.   He states that he lifted weights the night before but denies recent chiropractic manipulation, twisting of his neck. He does complain of neck pain on the left side. His sister and mother had multiple clots. 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 He takes crestor at home, not on ASA.  Date last known well: 12.20.18 Time last known well: 8.15 am tPA Given: outside window NIHSS: 1 Baseline MRS 0      SUBJECTIVE (INTERVAL HISTORY) His family is at the bedside.  Patient reports worsening symptoms of right facial numbness and left arm numbness, right neck pain. Discussed imaging, answered all questions. Consulted with dr. Otelia LimesLindzen and Dr. Catha GosselinMikhail. Moved to Neuro [facility] and ordered repeat MRI brain and an MRA neck to see if we can confirm dissection. Heparin started and family consents.   OBJECTIVE Temp:  [97.3 F (36.3 C)-98.7 F (37.1 C)] 98.3 F (36.8 C) (12/22 1200) Pulse Rate:  [66-87] 76 (12/22 1230) Cardiac Rhythm: Normal sinus rhythm (12/22 1130) Resp:  [12-21] 14 (12/22 1230) BP: (114-143)/(77-110) 132/99 (12/22 1230) SpO2:  [95 %-100 %] 97 % (12/22 1230) FiO2 (%):  [21 %]  21 % (12/21 2118) Weight:  [164 lb 14.5 oz (74.8 kg)-165 lb (74.8 kg)] 164 lb 14.5 oz (74.8 kg) (12/21 2227)  CBC:  Recent Labs  Lab 04/10/17 1430 04/11/17 1910  WBC 6.1 8.3  NEUTROABS 4.4 6.5  HGB 15.4 14.0  HCT 44.0 41.5  MCV 90.3 91.8  PLT 159 153    Basic Metabolic Panel:  Recent Labs  Lab 04/10/17 1430 04/11/17 1910  NA 140 139  K 4.4 4.3  CL 106 111  CO2 26 24  GLUCOSE 144* 120*  BUN 9 8  CREATININE 0.95 0.92  CALCIUM 9.4 8.4*    Lipid Panel:     Component Value Date/Time   CHOL 141 04/12/2017 0254   TRIG 65 04/12/2017 0254   HDL 71 04/12/2017 0254   CHOLHDL 2.0 04/12/2017 0254   VLDL 13 04/12/2017 0254   LDLCALC 57 04/12/2017 0254   HgbA1c:  Lab Results  Component Value Date   HGBA1C 5.2 04/12/2017   Urine Drug Screen: No results found for: LABOPIA, COCAINSCRNUR, LABBENZ, AMPHETMU, THCU, LABBARB  Alcohol Level No results found for: ETH  IMAGING  Ct Head Wo Contrast 04/10/2017 IMPRESSION:  Normal head CT.    Ct Angio Neck W Or Wo Contrast 04/11/2017 IMPRESSION:  1. 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 Finding is of uncertain etiology, without overt evidence for dissection or other acute abnormality. Dominant left vertebral artery widely patent to the vertebrobasilar junction.  2. Otherwise normal CTA of the neck.  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    Mr Brain 80 Contrast Mr Maxine Glenn Neck W Wo Contrast 04/12/2017 IMPRESSION:  Unchanged subcentimeter foci of restricted diffusion consistent with acute nonhemorrhagic infarction, involving the brainstem and inferior cerebellum, RIGHT PICA territory. 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 Of note, a history of neck pain was elicited by the neurologist. Hypercoagulable state resulting in thrombosis is also a consideration, and appropriate lab panel is pending. Except for the RIGHT vertebral occlusion, unremarkable appearing extracranial circulation.    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     Transthoracic Echocardiogram - pending 00/00/00    PHYSICAL EXAM Vitals:   04/12/17 1100 04/12/17 1130 04/12/17 1200 04/12/17 1230  BP: (!) 139/106 (!) 142/92 135/87 (!) 132/99  Pulse: 87 74 72 76  Resp: Temp:   98.3 F (36.8 C)   TempSrc:   Oral   SpO2: 100% 98% 97% 97%  Weight:      Height:        PHYSICAL EXAM Physical exam: Exam: Gen: NAD Eyes: anicteric sclerae, moist conjunctivae                    CV: no MRG, no carotid bruits, no peripheral edema Mental Status: Alert, follows commands, good historian  Neuro: Detailed Neurologic Exam  Speech:    No aphasia, no dysarthria, luent speech  Cranial Nerves:    Right horner Syndrome.  Attempted, 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  EOMI.  Visual fields full. Right facial numbness. Face symmetric, Tongue midline. Hearing intact to voice. Shoulder shrug intact  Motor Observation:    no involuntary movements noted. Tone appears normal.     Strength:    5/5     Sensation:  Left arm>leg numbness  Plantars downgoing.    ASSESSMENT/PLAN [name] is a 41 y.o. male with history of anxiety, tobacco use, hyperlipidemia, and family history significant for blood clots presenting after recent discharge 04/10/17 for dizziness, gait imbalance, nausea, headache, and progressive of right-sided facial numbness. He returned with worsening of his deficits. He did not receive IV t-PA due to late  presentation.  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   MRI head - Acute nonhemorrhagic subcentimeter Rt inferior cerebellar and Rt medullary infarcts.  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  Carotid Doppler - CTA neck  2D Echo - pending  LDL - 57  HgbA1c - 5.2  VTE prophylaxis - IV heparin Diet regular Room service appropriate? Yes; Fluid consistency: Thin  No antithrombotic prior to admission, now on heparin IV  Patient counseled to be compliant with his antithrombotic medications  Ongoing aggressive stroke risk factor management  Therapy recommendations:  pending  Disposition:  Pending  Hypertension  Stable  Permissive hypertension (OK if < 220/120)  but gradually normalize in 5-7 days  Long-term BP goal normotensive  Hyperlipidemia  Home meds:  Crestor 10 mg daily PTA  LDL 57, goal < 70  Crestor increased to 20 mg daily  Continue statin at discharge   Other Stroke Risk Factors  Former cigarette smoker - quit 14 months ago  ETOH use, advised to drink no more than 1 drink per day.  Family history of clotting disorder  Other Active Problems  Anxiety   Plan / Recommendations  Hospital day # 1  Personally  participated in, made any corrections needed, and agree with history, physical, neuro exam,assessment and plan as stated above.    Naomie Dean, MD Stroke Neurology  To contact Stroke Continuity provider, please refer to [email or web address]. After hours, contact General Neurology

## 2017-04-12 NOTE — Progress Notes (Signed)
  Echocardiogram 2D Echocardiogram has been performed.  Delcie RochENNINGTON, Casie Sturgeon 04/12/2017, 2:58 PM

## 2017-04-12 NOTE — CV Procedure (Signed)
Attempted 2D echo at 12:05, Patient was not in room, will do test at later time.  Jake Moore

## 2017-04-12 NOTE — Consult Note (Signed)
PULMONARY / CRITICAL CARE MEDICINE   Name: Jake Moore MRN: [id number] DOB: May 15, 1975    ADMISSION DATE:  04/11/2017 CONSULTATION DATE:  04/12/2017  REFERRING MD:  Catha Gosselin  CHIEF COMPLAINT:  Vertebral artery CVA  HISTORY OF PRESENT ILLNESS:   Jake Moore is a 41 y.o. W M who presented to the ED yesterday with a 1 day of dizziness and worsening right sided facial numbness upon awakening. This was followed with lightheadiness, N&V. As the symptoms persisted, he presented to the ED where a head CT was unremarkable and given a lack of cerebellar signs/symptoms with improvement of his dizziness, the presumptive diagnosis was felt to be peripheral vertigo; he was treated with meclizine and discharged.  Following his return home he started having numbness to his right nostril went to bed and woke up this am with numbness spreading to his right perioral area.  No slurred speech.  No trouble swallowing.  No tongue numbness.  He did note that when he drank something cold he could not feel in on the left side of his cheek.  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  Pt reports he does have elevated levels hypoprotein A levels and there is a history of a hypercoagulable state in a sister and his mother. He has been seen by vascular surgery who feels no surgical intervention is warranted and by Neurology, who does not feel either full anticoagulation or dual antiplatelet therapy is indicated; rather, just ASA. However, they have arranged for an MRA of the neck.  PAST MEDICAL HISTORY :  He  has a past medical history of Anxiety.  PAST SURGICAL HISTORY: He  has no past surgical history on file.  Allergies  Allergen Reactions  . Penicillins Hives    Has patient had a PCN reaction causing immediate rash, facial/tongue/throat swelling, SOB or lightheadedness with hypotension: YES Has patient had a PCN reaction causing severe rash involving mucus  membranes or skin necrosis: NO Has patient had a PCN reaction that required hospitalizationNO Has patient had a PCN reaction occurring within the last 10 years: NO If all of the above answers are "NO", then may proceed with Cephalosporin use.    No current facility-administered medications on file prior to encounter.    Current Outpatient Medications on File Prior to Encounter  Medication Sig  . atomoxetine (STRATTERA) 10 MG capsule Take 10 mg by mouth daily.  Marland Kitchen FLUoxetine (PROZAC) 20 MG tablet Take 20 mg by mouth daily.  Marland Kitchen ibuprofen (ADVIL,MOTRIN) 200 MG tablet Take 200 mg by mouth every 6 (six) hours as needed for moderate pain.  . meclizine (ANTIVERT) 25 MG tablet Take 1 tablet (25 mg total) by mouth 3 (three) times daily as needed for dizziness.  . Multiple Vitamin (MULTIVITAMIN) tablet Take 1 tablet by mouth daily.  . ondansetron (ZOFRAN) 8 MG tablet Take 1 tablet (8 mg total) by mouth every 8 (eight) hours as needed for nausea or vomiting.  . rosuvastatin (CRESTOR) 10 MG tablet Take 10 mg by mouth daily.  . naproxen (NAPROSYN) 500 MG tablet Take 1 tablet (500 mg total) by mouth 2 (two) times daily. (Patient not taking: Reported on 07/14/2016)    FAMILY HISTORY:  His has no family status information on file.  Positive FHx of a hypercoagulable disorder in sister and mother  SOCIAL HISTORY: He  reports that he quit smoking about 14 months ago. His smoking use included cigarettes. He quit  after 20.00 years of use. He has quit using smokeless tobacco. His smokeless tobacco use included chew. He reports that he drinks about 12.0 oz of alcohol per week. He reports that he does not use drugs.  REVIEW OF SYSTEMS:   Constitutional sxs: Per HPI Resp: No SOB, DOE, chronic cough sputum CV: No anginal sxs, hx OHD, HTN GI: No hematemesis, hematechezia, melena, PUD GU: No dysuria, hematuria Endo: No DM, thyroid disorders Neuro: No prior seizures, syncope   SUBJECTIVE:  NAD at  present  VITAL SIGNS: BP (!) 143/110   Pulse 79   Temp 98.5 F (36.9 C) (Oral)   Resp 16   Ht 5\' 10"  (1.778 m)   Wt 74.8 kg (164 lb 14.5 oz)   SpO2 99%   BMI 23.66 kg/m   HEMODYNAMICS:    VENTILATOR SETTINGS: FiO2 (%):  [21 %] 21 %  INTAKE / OUTPUT: I/O last 3 completed shifts: In: 1285 [I.V.:285; IV Piggyback:1000] Out: -   PHYSICAL EXAMINATION: General:  WD/WN WM  Neuro:  Moves all 4s wit 5/5 strength. Cns grossly intact HEENT:  [place]/AT PERRL, O-P: Normal Cardiovascular:  Nl S1 and S2, No murmurs Lungs:  Clear to P&A Abdomen:  Supple with H-Smegaly, masses, tenderness Musculoskeletal:  No joint deformities Skin:  No C,C,E  LABS:  BMET Recent Labs  Lab 04/10/17 1430 04/11/17 1910  NA 140 139  K 4.4 4.3  CL 106 111  CO2 26 24  BUN 9 8  CREATININE 0.95 0.92  GLUCOSE 144* 120*    Electrolytes Recent Labs  Lab 04/10/17 1430 04/11/17 1910  CALCIUM 9.4 8.4*    CBC Recent Labs  Lab 04/10/17 1430 04/11/17 1910  WBC 6.1 8.3  HGB 15.4 14.0  HCT 44.0 41.5  PLT 159 153    Coag's No results for input(s): APTT, INR in the last 168 hours.  Sepsis Markers No results for input(s): LATICACIDVEN, PROCALCITON, O2SATVEN in the last 168 hours.  ABG No results for input(s): PHART, PCO2ART, PO2ART in the last 168 hours.  Liver Enzymes Recent Labs  Lab 04/11/17 1910  AST 21  ALT 20  ALKPHOS 49  BILITOT 0.6  ALBUMIN 3.6    Cardiac Enzymes No results for input(s): TROPONINI, PROBNP in the last 168 hours.  Glucose No results for input(s): GLUCAP in the last 168 hours.  Imaging Ct Angio Neck W Or Wo Contrast  Result Date: 04/11/2017 CLINICAL DATA:  Initial evaluation for acute stroke. EXAM: CT ANGIOGRAPHY NECK TECHNIQUE: Multidetector CT imaging of the neck was performed using the standard protocol during bolus administration of intravenous contrast. Multiplanar CT image reconstructions and MIPs were obtained to evaluate the vascular anatomy.  Carotid stenosis measurements (when applicable) are obtained utilizing NASCET criteria, using the distal internal carotid diameter as the denominator. CONTRAST:  <See Chart> ISOVUE-370 IOPAMIDOL (ISOVUE-370) INJECTION 76% COMPARISON:  Comparison made with prior MRI/ MRA from earlier the same day. FINDINGS: Aortic arch: Visualized aortic arch of normal caliber with normal branch pattern. No flow-limiting stenosis about the origin of the great vessels. Visualized subclavian arteries widely patent without stenosis. Right carotid system: The right common carotid artery widely patent from its origin to the bifurcation without stenosis. No significant atheromatous plaque or narrowing about the right bifurcation. Right ICA widely patent from the bifurcation to the circle of Willis without stenosis, dissection, or occlusion. Left carotid system: Left common carotid artery patent from its origin to the bifurcation without stenosis. No significant atheromatous plaque or narrowing about the  left bifurcation. Left ICA patent from the bifurcation to the circle of Willis without stenosis, dissection, or occlusion. Vertebral arteries: Both of the vertebral arteries arise from the subclavian arteries. Left vertebral artery is dominant and widely patent to the vertebrobasilar junction without stenosis, dissection, or occlusion. Diminutive right vertebral artery demonstrates attenuated flow within the distal right V2 segment, and a essentially occludes at the V3 segment and is largely occluded at the level of the skullbase/cranial vault. No overt findings to suggest dissection or other abnormality. Minimal scant thready flow present within the right V4 segment, likely retrograde in nature from the basilar artery. Probable scant flow within the proximal right PICA, which is also essentially occluded. Skeleton: No acute osseus abnormality. No worrisome lytic or blastic osseous lesions. Small central disc protrusion with calcification  present at C6-7 without stenosis. Other neck: No acute soft tissue abnormality within the neck. No adenopathy. Salivary glands normal. Thyroid normal. Upper chest: Visualized upper chest within normal limits. Visualized lungs are clear. IMPRESSION: 1. 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 Finding is of uncertain etiology, without overt evidence for dissection or other acute abnormality. Dominant left vertebral artery widely patent to the vertebrobasilar junction. 2. Otherwise normal CTA of the neck. Electronically Signed   By: Rise MuBenjamin  McClintock M.D.   On: 04/11/2017 20:35   Mr Angiogram Head W Or Wo Contrast  Result Date: 04/11/2017 CLINICAL DATA:  Ataxia, stroke suspected, dizziness, RIGHT-sided facial numbness for 1 day. EXAM: MRI HEAD WITHOUT CONTRAST MRA HEAD WITHOUT CONTRAST TECHNIQUE: Multiplanar, multiecho pulse sequences of the brain and surrounding structures were obtained without intravenous contrast. Angiographic images of the head were obtained using MRA technique without contrast. COMPARISON:  Normal CT head performed 04/10/2017. FINDINGS: MRI HEAD FINDINGS Brain: Multiple areas of restricted diffusion, corresponding low ADC, subcentimeter in size, affect the RIGHT inferior cerebellum and dorsolateral medulla consistent with an acute RIGHT PICA territory infarct. No visible hemorrhage, mass lesion, hydrocephalus, or extra-axial fluid. Normal for age cerebral volume. No significant white matter disease. Vascular: Absent flow related enhancement in the non dominant RIGHT vertebral artery. Other flow voids are preserved. Skull and upper cervical spine: Normal marrow signal. Sinuses/Orbits: Unremarkable. Other: None. MRA HEAD FINDINGS The internal carotid arteries are widely patent throughout their cervical, petrous, cavernous, supraclinoid, and terminal segments. Normal-appearing anterior and middle cerebral artery segments bilaterally. 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 There is segmental, and thread-like flow related enhancement, essentially thrombosis, of the visualized V4 segment of the RIGHT vertebral artery. The distal V4 RIGHT vertebral artery is visualized, likely retrograde from the basilar. In addition, there is diminished to absent flow related enhancement in the RIGHT PICA. The other cerebellar branches are preserved.  No saccular aneurysm. 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 Findings discussed with ordering provider. Electronically Signed   By: Elsie StainJohn T Curnes M.D.   On: 04/11/2017 19:39   Mr Brain Wo Contrast  Result Date: 04/11/2017 CLINICAL DATA:  Ataxia, stroke suspected, dizziness, RIGHT-sided facial numbness for 1 day. EXAM: MRI HEAD WITHOUT CONTRAST MRA HEAD WITHOUT CONTRAST TECHNIQUE: Multiplanar, multiecho pulse sequences of the brain and surrounding structures were obtained without intravenous contrast. Angiographic images of the head were obtained using MRA technique without contrast. COMPARISON:  Normal CT head performed 04/10/2017. FINDINGS: MRI HEAD FINDINGS Brain: Multiple areas of restricted diffusion, corresponding  low ADC, subcentimeter in size, affect the RIGHT inferior cerebellum and dorsolateral medulla consistent with an acute RIGHT PICA territory infarct. No visible hemorrhage, mass lesion, hydrocephalus, or extra-axial fluid. Normal for age cerebral volume. No significant white matter disease. Vascular: Absent flow related enhancement in the non dominant RIGHT vertebral artery. Other flow voids are preserved. Skull and upper cervical spine: Normal marrow signal. Sinuses/Orbits: Unremarkable. Other: None. MRA HEAD FINDINGS The internal carotid arteries are widely patent throughout their cervical, petrous, cavernous, supraclinoid,  and terminal segments. Normal-appearing anterior and middle cerebral artery segments bilaterally. Basilar artery is dolichoectatic but widely patent, with the LEFT vertebral is the sole contributor. There is segmental, and thread-like flow related enhancement, essentially thrombosis, of the visualized V4 segment of the RIGHT vertebral artery. The distal V4 RIGHT vertebral artery is visualized, likely retrograde from the basilar. In addition, there is diminished to absent flow related enhancement in the RIGHT PICA. The other cerebellar branches are preserved.  No saccular aneurysm. 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 Findings discussed with ordering provider. Electronically Signed   By: Elsie Stain M.D.   On: 04/11/2017 19:39     STUDIES:  CT and CTA as above; MRA pending  SIGNIFICANT EVENTS: Occlusion of the right vertebral artery at the level of the V3 segment  LINES/TUBES: None  DISCUSSION: 41 y.o. 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 With positive family hx, should do some basic tests of hypercoagulability despite absence of venoocclusive disease.  ASSESSMENT / PLAN:  PULMONARY A: No acute problem P:   Follow  CARDIOVASCULAR A:  Vertebral artery and PICA infarct P:  MRA and TEE per Neurol  RENAL A:   Nl indices P:   Follow  NEUROLOGIC A:  Vertebral artery and PICA infarct P:   MRA and TEE per Neurol    Pulmonary and Critical Care Medicine [facility] Pager: [phone]  04/12/2017, 11:18 AM

## 2017-04-12 NOTE — Progress Notes (Signed)
OT Cancellation Note  Patient Details Name: Jake BameRobert F Moore MRN: [id number] DOB: 10/14/1975   Cancelled Treatment:    Reason Eval/Treat Not Completed: Patient at procedure or test/ unavailable. Pt at MRI.  Pt also with new symptoms this morning per chart review.  Will follow up tomorrow for evaluation.  Cipriano MileJohnson, Jenna Elizabeth OTR/L 04/12/2017, 12:57 PM

## 2017-04-12 NOTE — Progress Notes (Signed)
Patient in MRI 

## 2017-04-12 NOTE — Progress Notes (Signed)
Patient transferred to 4N

## 2017-04-12 NOTE — Progress Notes (Signed)
PT Cancellation Note  Patient Details Name: Christen BameRobert F Granier MRN: [id number] DOB: 08/14/1975   Cancelled Treatment:    Reason Eval/Treat Not Completed: Patient at procedure or test/unavailable;Patient not medically ready Pt at MRI. Also with new worsening stroke symptoms, transferred to neuro ICU. Will follow up.   Blake DivineShauna A Dick Hark 04/12/2017, 1:54 PM Mylo RedShauna Tamana Hatfield, PT, DPT [phone]

## 2017-04-13 ENCOUNTER — Encounter (HOSPITAL_COMMUNITY): Payer: BLUE CROSS/BLUE SHIELD

## 2017-04-13 ENCOUNTER — Inpatient Hospital Stay (HOSPITAL_COMMUNITY): Payer: BLUE CROSS/BLUE SHIELD

## 2017-04-13 DIAGNOSIS — I63111 Cerebral infarction due to embolism of right vertebral artery: Secondary | ICD-10-CM

## 2017-04-13 DIAGNOSIS — E785 Hyperlipidemia, unspecified: Secondary | ICD-10-CM

## 2017-04-13 DIAGNOSIS — G463 Brain stem stroke syndrome: Secondary | ICD-10-CM

## 2017-04-13 DIAGNOSIS — I6501 Occlusion and stenosis of right vertebral artery: Secondary | ICD-10-CM

## 2017-04-13 DIAGNOSIS — E876 Hypokalemia: Secondary | ICD-10-CM

## 2017-04-13 DIAGNOSIS — F988 Other specified behavioral and emotional disorders with onset usually occurring in childhood and adolescence: Secondary | ICD-10-CM

## 2017-04-13 DIAGNOSIS — F411 Generalized anxiety disorder: Secondary | ICD-10-CM

## 2017-04-13 LAB — HEPARIN LEVEL (UNFRACTIONATED)
HEPARIN UNFRACTIONATED: 0.25 [IU]/mL — AB (ref 0.30–0.70)
Heparin Unfractionated: 0.62 IU/mL (ref 0.30–0.70)
Heparin Unfractionated: 0.4 IU/mL (ref 0.30–0.70)

## 2017-04-13 LAB — BASIC METABOLIC PANEL
ANION GAP: 5 (ref 5–15)
BUN: 6 mg/dL (ref 6–20)
CALCIUM: 8.5 mg/dL — AB (ref 8.9–10.3)
CO2: 24 mmol/L (ref 22–32)
Chloride: 108 mmol/L (ref 101–111)
Creatinine, Ser: 0.77 mg/dL (ref 0.61–1.24)
GLUCOSE: 111 mg/dL — AB (ref 65–99)
POTASSIUM: 3.4 mmol/L — AB (ref 3.5–5.1)
Sodium: 137 mmol/L (ref 135–145)

## 2017-04-13 LAB — CBC
HEMATOCRIT: 42 % (ref 39.0–52.0)
Hemoglobin: 14.2 g/dL (ref 13.0–17.0)
MCH: 31.1 pg (ref 26.0–34.0)
MCHC: 33.8 g/dL (ref 30.0–36.0)
MCV: 91.9 fL (ref 78.0–100.0)
PLATELETS: 149 10*3/uL — AB (ref 150–400)
RBC: 4.57 MIL/uL (ref 4.22–5.81)
RDW: 12.3 % (ref 11.5–15.5)
WBC: 6 10*3/uL (ref 4.0–10.5)

## 2017-04-13 LAB — PROTEIN S, TOTAL: PROTEIN S AG TOTAL: 65 % (ref 60–150)

## 2017-04-13 LAB — PROTEIN S ACTIVITY: Protein S Activity: 86 % (ref 63–140)

## 2017-04-13 LAB — PROTEIN C ACTIVITY: Protein C Activity: 97 % (ref 73–180)

## 2017-04-13 MED ORDER — MECLIZINE HCL 12.5 MG PO TABS
25.0000 mg | ORAL_TABLET | Freq: Three times a day (TID) | ORAL | Status: DC | PRN
  Administered 2017-04-15: 25 mg via ORAL
  Filled 2017-04-13: qty 1
  Filled 2017-04-13: qty 2

## 2017-04-13 NOTE — Progress Notes (Signed)
ANTICOAGULATION CONSULT NOTE  Pharmacy Consult for heparin Indication: CVA  Patient Measurements: Height: 5\' 10"  (177.8 cm) Weight: 164 lb 14.5 oz (74.8 kg) IBW/kg (Calculated) : 73 Heparin Dosing Weight: 74.8kg  Labs: Recent Labs    04/10/17 1430 04/11/17 1910 04/12/17 1456 04/13/17 0320  HGB 15.4 14.0  --  14.2  HCT 44.0 41.5  --  42.0  PLT 159 153  --  149*  HEPARINUNFRC  --   --  0.34 0.25*  CREATININE 0.95 0.92  --   --    Estimated Creatinine Clearance: 110.2 mL/min (by C-G formula based on SCr of 0.92 mg/dL).  Assessment:  41 y.o. male with new ischemic CVA, hypercoaguable workup ongoing, for heparin  Goal of Therapy:  Heparin level 0.3-0.5 Monitor platelets by anticoagulation protocol: Yes   Plan:  Increase Heparin 1100 units/hr Check heparin level in 8 hours.  Geannie RisenGreg Yoskar Murrillo, PharmD, BCPS

## 2017-04-13 NOTE — Progress Notes (Signed)
PULMONARY / CRITICAL CARE MEDICINE   Name: Jake Moore MRN: [id number] DOB: February 27, 1976    ADMISSION DATE:  04/11/2017 CONSULTATION DATE:  04/12/2017  REFERRING MD:  Catha Gosselin  CHIEF COMPLAINT:  Vertebral artery CVA  HISTORY OF PRESENT ILLNESS:   Jake Moore is a 41 y.o. W M who presented to the ED yesterday with a 1 day of dizziness and worsening right sided facial numbness upon awakening. This was followed with lightheadiness, N&V. As the symptoms persisted, he presented to the ED where a head CT was unremarkable and given a lack of cerebellar signs/symptoms with improvement of his dizziness, the presumptive diagnosis was felt to be peripheral vertigo; he was treated with meclizine and discharged. Following his return home he started having numbness to his right nostril went to bed and woke up this am with numbness spreading to his right perioral area. No slurred speech. No trouble swallowing. No tongue numbness. He did note that when he drank something cold he could not feel in on the left side of his cheek. 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 Pt reports he does have elevated levels hypoprotein A levels and there is a history of a hypercoagulable state in a sister and his mother. He has been seen by vascular surgery who feels no surgical intervention is warranted and by Neurology, who initially did not feel either full anticoagulation or dual antiplatelet therapy is indicated; rather, just ASA decided to fully anticoagulate due to recurring sxs. MRA confirmed subcentimeter foci of restricted diffusion consistent with acute nonhemorrhagic infarction, involving the brainstem and inferior cerebellum, RIGHT PICA territory.  PAST MEDICAL HISTORY :  He  has a past medical history of Anxiety.  PAST SURGICAL HISTORY: He  has no past surgical history on file.  Allergies  Allergen Reactions  . Penicillins Hives    Has patient had a  PCN reaction causing immediate rash, facial/tongue/throat swelling, SOB or lightheadedness with hypotension: YES Has patient had a PCN reaction causing severe rash involving mucus membranes or skin necrosis: NO Has patient had a PCN reaction that required hospitalizationNO Has patient had a PCN reaction occurring within the last 10 years: NO If all of the above answers are "NO", then may proceed with Cephalosporin use.    No current facility-administered medications on file prior to encounter.    Current Outpatient Medications on File Prior to Encounter  Medication Sig  . atomoxetine (STRATTERA) 10 MG capsule Take 10 mg by mouth daily.  Marland Kitchen FLUoxetine (PROZAC) 20 MG tablet Take 20 mg by mouth daily.  Marland Kitchen ibuprofen (ADVIL,MOTRIN) 200 MG tablet Take 200 mg by mouth every 6 (six) hours as needed for moderate pain.  . meclizine (ANTIVERT) 25 MG tablet Take 1 tablet (25 mg total) by mouth 3 (three) times daily as needed for dizziness.  . Multiple Vitamin (MULTIVITAMIN) tablet Take 1 tablet by mouth daily.  . ondansetron (ZOFRAN) 8 MG tablet Take 1 tablet (8 mg total) by mouth every 8 (eight) hours as needed for nausea or vomiting.  . rosuvastatin (CRESTOR) 10 MG tablet Take 10 mg by mouth daily.  . naproxen (NAPROSYN) 500 MG tablet Take 1 tablet (500 mg total) by mouth 2 (two) times daily. (Patient not taking: Reported on 07/14/2016)    FAMILY HISTORY:  His has no family status information on file.    SOCIAL HISTORY: He  reports that he quit smoking about 14 months ago. His smoking use  included cigarettes. He quit after 20.00 years of use. He has quit using smokeless tobacco. His smokeless tobacco use included chew. He reports that he drinks about 12.0 oz of alcohol per week. He reports that he does not use drugs.  REVIEW OF SYSTEMS:   Per previous note  SUBJECTIVE:  Early AM the patient c/o severe HA posteriorly with nystagmus. Repeat head CT showed no new lesions.   VITAL SIGNS: BP (!)  132/100 (BP Location: Right Arm)   Pulse 66   Temp 97.8 F (36.6 C) (Oral)   Resp 14   Ht 5\' 10"  (1.778 m)   Wt 74.8 kg (164 lb 14.5 oz)   SpO2 99%   BMI 23.66 kg/m   HEMODYNAMICS:    VENTILATOR SETTINGS:    INTAKE / OUTPUT: I/O last 3 completed shifts: In: 3604.8 [I.V.:2604.8; IV Piggyback:1000] Out: 2850 [Urine:2850]  PHYSICAL EXAMINATION: General:  WD/WN NAD Neuro:  Unsteadiness upright and in gait. Otherwise unchanged HEENT:  [place]/AT. No change Cardiovascular:  RRR, no murmurs Lungs:  CTAB Abdomen:  Supple Musculoskeletal:  No active joints Skin:  No C,C,E  LABS:  BMET Recent Labs  Lab 04/10/17 1430 04/11/17 1910 04/13/17 0320  NA 140 139 137  K 4.4 4.3 3.4*  CL 106 111 108  CO2 26 24 24   BUN 9 8 6   CREATININE 0.95 0.92 0.77  GLUCOSE 144* 120* 111*    Electrolytes Recent Labs  Lab 04/10/17 1430 04/11/17 1910 04/13/17 0320  CALCIUM 9.4 8.4* 8.5*    CBC Recent Labs  Lab 04/10/17 1430 04/11/17 1910 04/13/17 0320  WBC 6.1 8.3 6.0  HGB 15.4 14.0 14.2  HCT 44.0 41.5 42.0  PLT 159 153 149*    Coag's No results for input(s): APTT, INR in the last 168 hours.  Sepsis Markers No results for input(s): LATICACIDVEN, PROCALCITON, O2SATVEN in the last 168 hours.  ABG No results for input(s): PHART, PCO2ART, PO2ART in the last 168 hours.  Liver Enzymes Recent Labs  Lab 04/11/17 1910  AST 21  ALT 20  ALKPHOS 49  BILITOT 0.6  ALBUMIN 3.6    Cardiac Enzymes No results for input(s): TROPONINI, PROBNP in the last 168 hours.  Glucose No results for input(s): GLUCAP in the last 168 hours.  Imaging Ct Head Wo Contrast  Result Date: 04/13/2017 CLINICAL DATA:  Headache and nystagmus. Recent small cerebellar infarct. EXAM: CT HEAD WITHOUT CONTRAST TECHNIQUE: Contiguous axial images were obtained from the base of the skull through the vertex without intravenous contrast. COMPARISON:  Brain MRI 04/12/2017 FINDINGS: Brain: No mass lesion,  intraparenchymal hemorrhage or extra-axial collection. Focal area of hypoattenuation in the right cerebellum corresponds to the infarct seen on MRI. Brain parenchyma and CSF-containing spaces are normal for age. Vascular: No hyperdense vessel or unexpected calcification. Skull: Normal visualized skull base, calvarium and extracranial soft tissues. Sinuses/Orbits: No sinus fluid levels or advanced mucosal thickening. No mastoid effusion. Normal orbits. IMPRESSION: 1. Expected appearance of small right cerebellar infarct, without hemorrhage. No mass effect. 2. Otherwise normal brain. Electronically Signed   By: Deatra Robinson M.D.   On: 04/13/2017 03:06   Mr Maxine Glenn Neck W Wo Contrast  Result Date: 04/12/2017 CLINICAL DATA:  Continued surveillance of stroke. RIGHT vertebral occlusion. EXAM: MRI HEAD WITHOUT CONTRAST MRA NECK WITHOUT AND WITH CONTRAST TECHNIQUE: Multiplanar, multiecho pulse sequences of the brain and surrounding structures were obtained without intravenous contrast. Angiographic images of the neck were obtained using MRA technique with and without intravenous contrast. Carotid  stenosis measurements (when applicable) are obtained utilizing NASCET criteria, using the distal internal carotid diameter as the denominator. CONTRAST:  15mL MULTIHANCE GADOBENATE DIMEGLUMINE 529 MG/ML IV SOLN COMPARISON:  MR brain 04/11/2017.  CTA head neck 04/11/2017. FINDINGS: MRI HEAD FINDINGS Brain: Unchanged subcentimeter foci of restricted diffusion, RIGHT brainstem and inferior cerebellum, consistent with multifocal infarction in the setting of RIGHT vertebral and RIGHT PICA occlusion. No new areas of concern. Vascular: See below. Skull and upper cervical spine: Normal marrow signal. Sinuses/Orbits: Unremarkable. Other: Incidental note is made of medial positioning of the RIGHT vocal cord. This is likely the result of the lateral medullary infarct. MRA NECK FINDINGS No great vessel stenosis.  Normal transverse arch.   Bovine trunk. Normal BILATERAL common carotid arteries. Normal BILATERAL carotid bifurcations. No ICA dissection. No changes of fibromuscular dysplasia. Normal BILATERAL subclavian arteries.  Normal vertebral origins. LEFT vertebral dominant and widely patent through the neck. RIGHT vertebral in the neck is diminutive, related to the V3 occlusion at the C1 level. No definitive evidence pointing to dissection is observed, such as fibromuscular disease, or blood products surrounding the vessel; nevertheless, dissection appears to be the most likely etiology. IMPRESSION: Unchanged subcentimeter foci of restricted diffusion consistent with acute nonhemorrhagic infarction, involving the brainstem and inferior cerebellum, RIGHT PICA territory. 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 Of note, a history of neck pain was elicited by the neurologist. Hypercoagulable state resulting in thrombosis is also a consideration, and appropriate lab panel is pending. Except for the RIGHT vertebral occlusion, unremarkable appearing extracranial circulation. Electronically Signed   By: Elsie StainJohn T Curnes M.D.   On: 04/12/2017 13:44   Mr Brain Wo Contrast  Result Date: 04/12/2017 CLINICAL DATA:  Continued surveillance of stroke. RIGHT vertebral occlusion. EXAM: MRI HEAD WITHOUT CONTRAST MRA NECK WITHOUT AND WITH CONTRAST TECHNIQUE: Multiplanar, multiecho pulse sequences of the brain and surrounding structures were obtained without intravenous contrast. Angiographic images of the neck were obtained using MRA technique with and without intravenous contrast. Carotid stenosis measurements (when applicable) are obtained utilizing NASCET criteria, using the distal internal carotid diameter as the denominator. CONTRAST:  15mL MULTIHANCE GADOBENATE DIMEGLUMINE 529 MG/ML IV SOLN COMPARISON:  MR brain 04/11/2017.  CTA head  neck 04/11/2017. FINDINGS: MRI HEAD FINDINGS Brain: Unchanged subcentimeter foci of restricted diffusion, RIGHT brainstem and inferior cerebellum, consistent with multifocal infarction in the setting of RIGHT vertebral and RIGHT PICA occlusion. No new areas of concern. Vascular: See below. Skull and upper cervical spine: Normal marrow signal. Sinuses/Orbits: Unremarkable. Other: Incidental note is made of medial positioning of the RIGHT vocal cord. This is likely the result of the lateral medullary infarct. MRA NECK FINDINGS No great vessel stenosis.  Normal transverse arch.  Bovine trunk. Normal BILATERAL common carotid arteries. Normal BILATERAL carotid bifurcations. No ICA dissection. No changes of fibromuscular dysplasia. Normal BILATERAL subclavian arteries.  Normal vertebral origins. LEFT vertebral dominant and widely patent through the neck. RIGHT vertebral in the neck is diminutive, related to the V3 occlusion at the C1 level. No definitive evidence pointing to dissection is observed, such as fibromuscular disease, or blood products surrounding the vessel; nevertheless, dissection appears to be the most likely etiology. IMPRESSION: Unchanged subcentimeter foci of restricted diffusion consistent with acute nonhemorrhagic infarction, involving the brainstem and inferior cerebellum, RIGHT PICA territory. 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 Of note, a history of neck pain was elicited by the neurologist. Hypercoagulable state resulting in thrombosis is also a consideration, and appropriate lab panel is pending. Except for the RIGHT vertebral occlusion, unremarkable appearing extracranial circulation. Electronically Signed   By: Elsie StainJohn T Curnes M.D.   On: 04/12/2017 13:44     STUDIES:  Repeat CT as above  LINES/TUBES: None  DISCUSSION: 41 y.o. 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 With positive family hx, some basic tests of hypercoagulability despite absence of venoocclusive disease are pending.    ASSESSMENT / PLAN:  PULMONARY A: No acute problem P:   Follow  CARDIOVASCULAR A:  Hemodyn stable P:  Observe  RENAL A:   Nl indices P:   Follow  GASTROINTESTINAL A:   On a liquid diet P:   Advance per dietician  HEMATOLOGIC A:   Nl indices P:  Observe  NEUROLOGIC A:   Vertebral artery and PICA infarct P:   Plans per Neurol  Critical Care time 30 min   Pulmonary and Critical Care Medicine [facility] Pager: [phone]  04/13/2017, 9:05 AM

## 2017-04-13 NOTE — Evaluation (Signed)
Speech Language Pathology Evaluation Patient Details Name: Jake Moore MRN: [id number] DOB: 03/25/1976 Today's Date: 04/13/2017 Time: [id number] SLP Time Calculation (min) (ACUTE ONLY): 20 min  Problem List:  Patient Active Problem List   Diagnosis Date Noted  . Lateral medullary syndrome   . Stroke (HCC) 04/11/2017  . Hyperlipidemia 04/11/2017  . 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 Anxiety    Past Medical History:  Past Medical History:  Diagnosis Date  . Anxiety    Past Surgical History: History reviewed. No pertinent surgical history. HPI:  Jake Moore is a 41 y.o. male with medical history significant of anxiety , hld comes in with over a day of dizziness and worsening right sided facial numbness. 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. Medial positioning of the RIGHT vocal cord noted on MRA 04/13/17.   Assessment / Plan / Recommendation Clinical Impression  Patient presents with hoarse vocal quality; of note MRA with incidental finding of R vocal cord medialization. Recommend higher-level cognitive testing to be completing in next venue of care or as an outpatient. Pt scored 29/30 on MOCA form 7.1. Delayed recall 4/5, and pt made an error in serial subtraction. 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 Pt passed RN swallow screen and no formal swallow evaluation ordered. Given location of CVA and hoarse vocal quality, spoke with RN re: monitoring closely for overt signs of aspiration, lung sounds; consult SLP should concerns for dysphagia arise. Agree pt would benefit from CIR to address higher level cognitive deficits and vocal quality. Will follow acutely.     SLP Assessment  SLP Recommendation/Assessment: Patient needs continued Speech Lanaguage Pathology Services SLP  Visit Diagnosis: Cognitive communication deficit (R41.841)    Follow Up Recommendations  Inpatient Rehab    Frequency and Duration min 1 x/week  1 week      SLP Evaluation Cognition  Overall Cognitive Status: Impaired/Different from baseline Arousal/Alertness: Awake/alert Orientation Level: Oriented X4 Attention: Focused;Sustained;Selective Focused Attention: Appears intact Sustained Attention: Appears intact Selective Attention: Appears intact Memory: Impaired Memory Impairment: Other (comment)(delayed recall 4/5) Awareness: Appears intact Problem Solving: Impaired Problem Solving Impairment: Verbal complex(4/5 serial 7s) Safety/Judgment: Appears intact       Comprehension  Auditory Comprehension Overall Auditory Comprehension: Appears within functional limits for tasks assessed Visual Recognition/Discrimination Discrimination: Within Function Limits Reading Comprehension Reading Status: Not tested    Expression Expression Primary Mode of Expression: Verbal Verbal Expression Overall Verbal Expression: Appears within functional limits for tasks assessed Written Expression Dominant Hand: Right Written Expression: Not tested   Oral / Motor  Oral Motor/Sensory Function Overall Oral Motor/Sensory Function: Mild impairment Facial Sensation: Reduced right Lingual Sensation: Reduced(pt reports reduced R) Motor Speech Overall Motor Speech: Impaired Respiration: Within functional limits Phonation: Hoarse Resonance: Within functional limits Articulation: Within functional limitis Intelligibility: Intelligible Motor Planning: Witnin functional limits Motor Speech Errors: Not applicable   GO                   Jake BatonMary Beth Tahje Borawski, MS, CCC-SLP Speech-Language Pathologist [phone]  Jake Moore 04/13/2017, 5:23 PM

## 2017-04-13 NOTE — Progress Notes (Signed)
Occupational Therapy Treatment Patient Details Name: Jake BameRobert F Moore MRN: [id number] DOB: 07/11/1975 Today's Date: 04/13/2017    History of present illness Jake BameRobert F Moore is a 41 y.o. male with medical history significant of anxiety , hld comes in with over a day of dizziness and worsening right sided facial numbness. 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   OT comments  Returning for second session to further assess vision and address diplopia. Upon arrival, pt having recently woken from nap and reporting significant dizziness. Educated pt on tapping glasses to reduce diplopia. Issued taped glasses and adjusted to pt's needs. Pt reporting that diplopia disappearing with taped glasses (at the nasal portion of non-dominant eye). Pt presenting with increased depth perception and reading when wearing taped glasses. Will continue to follow acutely to facilitate safe dc. Continue to recommend dc to CIR.   Follow Up Recommendations  CIR;Supervision/Assistance - 24 hour    Equipment Recommendations  Other (comment)(Defer to next venue)    Recommendations for Other Services PT consult;Rehab consult    Precautions / Restrictions Precautions Precautions: Fall Precaution Comments: Very poor balance Restrictions Weight Bearing Restrictions: No       Mobility Bed Mobility                  Transfers                      Balance Overall balance assessment: Needs assistance Sitting-balance support: Feet supported Sitting balance-Leahy Scale: Fair Sitting balance - Comments: Significantly decr tolerance of R lean/weight shift                                   ADL either performed or assessed with clinical judgement   ADL Overall ADL's : Needs assistance/impaired                                       General ADL  Comments: Focusing session on education on double vision and providing tapping as compensatory strategy for double vision. After placing tape on glasses, pt stating double vision was reduced and he was able to read text messages on his phone.     Vision   Vision Assessment?: Yes Eye Alignment: Within Functional Limits(Nigstagmus) Ocular Range of Motion: Within Functional Limits Alignment/Gaze Preference: Within Defined Limits Tracking/Visual Pursuits: Able to track stimulus in all quads without difficulty;Decreased smoothness of horizontal tracking;Decreased smoothness of vertical tracking;Other (comment)(Able to track all four quads but decreased smoothness due to nistagmus) Convergence: Impaired (comment)(Able to converge but with effort due to nistagmus) Visual Fields: No apparent deficits Diplopia Assessment: Disappears with one eye closed;Present all the time/all directions;Objects split side to side Depth Perception: Undershoots Additional Comments: Pt R eye dominant. Proving taped glassed on the nasal portion of the non-domiannt eye. Pt reports that diplopia disappears with tapped glasses. Pt with poor dept perception and undershooting and to R without glasses; improved depth perception with tapped glasses.   Perception     Praxis      Cognition Arousal/Alertness: Awake/alert Behavior During Therapy: WFL for tasks assessed/performed Overall Cognitive Status: Within Functional Limits for tasks assessed  Exercises     Shoulder Instructions       General Comments Provided handout on diplopia and tapping technique to reduce double vision    Pertinent Vitals/ Pain       Pain Assessment: No/denies pain Faces Pain Scale: No hurt Pain Intervention(s): Monitored during session  Home Living     Available Help at Discharge: Family;Available 24 hours/day Type of Home: House                              Lives  With: Spouse    Prior Functioning/Environment              Frequency  Min 3X/week        Progress Toward Goals  OT Goals(current goals can now be found in the care plan section)  Progress towards OT goals: Progressing toward goals  Acute Rehab OT Goals Patient Stated Goal: Back to normal OT Goal Formulation: With patient Time For Goal Achievement: 04/27/17 Potential to Achieve Goals: Good ADL Goals Pt Will Perform Grooming: with modified independence;standing Pt Will Perform Upper Body Dressing: with modified independence;standing Pt Will Perform Lower Body Dressing: with modified independence;sit to/from stand Pt Will Transfer to Toilet: with modified independence;regular height toilet;ambulating Additional ADL Goal #1: Pt will verbalize understanding of managing tapping techniques on glasses for double vision with 1-2 VCs.  Plan Discharge plan remains appropriate    Co-evaluation                 AM-PAC PT "6 Clicks" Daily Activity     Outcome Measure   Help from another person eating meals?: None Help from another person taking care of personal grooming?: A Little Help from another person toileting, which includes using toliet, bedpan, or urinal?: A Lot Help from another person bathing (including washing, rinsing, drying)?: A Lot Help from another person to put on and taking off regular upper body clothing?: A Little Help from another person to put on and taking off regular lower body clothing?: A Little 6 Click Score: 17    End of Session Equipment Utilized During Treatment: Other (comment)(Tapped glasses)  OT Visit Diagnosis: Unsteadiness on feet (R26.81);Other abnormalities of gait and mobility (R26.89);Other (comment)(Diplopia)   Activity Tolerance Patient tolerated treatment well   Patient Left with call bell/phone within reach;in bed;with bed alarm set(with SLP)   Nurse Communication Other (comment)(Diplopia reduced with glasses)         Time: [id number] OT Time Calculation (min): 17 min  Charges: OT General Charges $OT Visit: 1 Visit OT Treatments $Therapeutic Activity: 8-22 mins  Doylene Splinter MSOT, OTR/L Acute Rehab Pager: [phone] Office: [phone]   Theodoro GristCharis M Ikenna Ohms 04/13/2017, 5:09 PM

## 2017-04-13 NOTE — Progress Notes (Signed)
Advanced Heart Failure Rounding Note   Subjective:     Doing well. Still with double vision and ataxia. No worsening of symptoms. Cardiac rhythm stable in NSR.    Objective:   Weight Range:  Vital Signs:   Temp:  [97.7 F (36.5 C)-98.3 F (36.8 C)] 97.8 F (36.6 C) (12/23 0800) Pulse Rate:  [63-88] 80 (12/23 1000) Resp:  [9-18] 16 (12/23 1000) BP: (103-156)/(81-110) 103/81 (12/23 1000) SpO2:  [97 %-100 %] 100 % (12/23 1000) Last BM Date: 04/09/17  Weight change: Filed Weights   04/11/17 1707 04/11/17 2227  Weight: 74.8 kg (165 lb) 74.8 kg (164 lb 14.5 oz)    Intake/Output:   Intake/Output Summary (Last 24 hours) at 04/13/2017 1029 Last data filed at 04/13/2017 1000 Gross per 24 hour  Intake 2038.5 ml  Output 2000 ml  Net 38.5 ml     Physical Exam: General:  Well appearing. No resp difficulty HEENT: normal except for dysconjugate gaze Neck: supple. JVP flat. Carotids 2+ bilat; no bruits. No lymphadenopathy or thryomegaly appreciated. Cor: PMI nondisplaced. Regular rate & rhythm. No rubs, gallops or murmurs. Lungs: clear Abdomen: soft, nontender, nondistended. No hepatosplenomegaly. No bruits or masses. Good bowel sounds. Extremities: no cyanosis, clubbing, rash, edema Neuro: alert & orientedx3, dysconjugate gaze. + FTN ataxia. Affect pleasant  Telemetry: NSR 70s No arrhythmias Personally reviewed   Labs: Basic Metabolic Panel: Recent Labs  Lab 04/10/17 1430 04/11/17 1910 04/13/17 0320  NA 140 139 137  K 4.4 4.3 3.4*  CL 106 111 108  CO2 26 24 24   GLUCOSE 144* 120* 111*  BUN 9 8 6   CREATININE 0.95 0.92 0.77  CALCIUM 9.4 8.4* 8.5*    Liver Function Tests: Recent Labs  Lab 04/11/17 1910  AST 21  ALT 20  ALKPHOS 49  BILITOT 0.6  PROT 5.7*  ALBUMIN 3.6   No results for input(s): LIPASE, AMYLASE in the last 168 hours. No results for input(s): AMMONIA in the last 168 hours.  CBC: Recent Labs  Lab 04/10/17 1430 04/11/17 1910  04/13/17 0320  WBC 6.1 8.3 6.0  NEUTROABS 4.4 6.5  --   HGB 15.4 14.0 14.2  HCT 44.0 41.5 42.0  MCV 90.3 91.8 91.9  PLT 159 153 149*    Cardiac Enzymes: No results for input(s): CKTOTAL, CKMB, CKMBINDEX, TROPONINI in the last 168 hours.  BNP: BNP (last 3 results) No results for input(s): BNP in the last 8760 hours.  ProBNP (last 3 results) No results for input(s): PROBNP in the last 8760 hours.    Other results:  Imaging: Ct Head Wo Contrast  Result Date: 04/13/2017 CLINICAL DATA:  Headache and nystagmus. Recent small cerebellar infarct. EXAM: CT HEAD WITHOUT CONTRAST TECHNIQUE: Contiguous axial images were obtained from the base of the skull through the vertex without intravenous contrast. COMPARISON:  Brain MRI 04/12/2017 FINDINGS: Brain: No mass lesion, intraparenchymal hemorrhage or extra-axial collection. Focal area of hypoattenuation in the right cerebellum corresponds to the infarct seen on MRI. Brain parenchyma and CSF-containing spaces are normal for age. Vascular: No hyperdense vessel or unexpected calcification. Skull: Normal visualized skull base, calvarium and extracranial soft tissues. Sinuses/Orbits: No sinus fluid levels or advanced mucosal thickening. No mastoid effusion. Normal orbits. IMPRESSION: 1. Expected appearance of small right cerebellar infarct, without hemorrhage. No mass effect. 2. Otherwise normal brain. Electronically Signed   By: Deatra RobinsonKevin  Herman M.D.   On: 04/13/2017 03:06   Ct Angio Neck W Or Wo Contrast  Result Date: 04/11/2017  CLINICAL DATA:  Initial evaluation for acute stroke. EXAM: CT ANGIOGRAPHY NECK TECHNIQUE: Multidetector CT imaging of the neck was performed using the standard protocol during bolus administration of intravenous contrast. Multiplanar CT image reconstructions and MIPs were obtained to evaluate the vascular anatomy. Carotid stenosis measurements (when applicable) are obtained utilizing NASCET criteria, using the distal internal  carotid diameter as the denominator. CONTRAST:  <See Chart> ISOVUE-370 IOPAMIDOL (ISOVUE-370) INJECTION 76% COMPARISON:  Comparison made with prior MRI/ MRA from earlier the same day. FINDINGS: Aortic arch: Visualized aortic arch of normal caliber with normal branch pattern. No flow-limiting stenosis about the origin of the great vessels. Visualized subclavian arteries widely patent without stenosis. Right carotid system: The right common carotid artery widely patent from its origin to the bifurcation without stenosis. No significant atheromatous plaque or narrowing about the right bifurcation. Right ICA widely patent from the bifurcation to the circle of Willis without stenosis, dissection, or occlusion. Left carotid system: Left common carotid artery patent from its origin to the bifurcation without stenosis. No significant atheromatous plaque or narrowing about the left bifurcation. Left ICA patent from the bifurcation to the circle of Willis without stenosis, dissection, or occlusion. Vertebral arteries: Both of the vertebral arteries arise from the subclavian arteries. Left vertebral artery is dominant and widely patent to the vertebrobasilar junction without stenosis, dissection, or occlusion. Diminutive right vertebral artery demonstrates attenuated flow within the distal right V2 segment, and a essentially occludes at the V3 segment and is largely occluded at the level of the skullbase/cranial vault. No overt findings to suggest dissection or other abnormality. Minimal scant thready flow present within the right V4 segment, likely retrograde in nature from the basilar artery. Probable scant flow within the proximal right PICA, which is also essentially occluded. Skeleton: No acute osseus abnormality. No worrisome lytic or blastic osseous lesions. Small central disc protrusion with calcification present at C6-7 without stenosis. Other neck: No acute soft tissue abnormality within the neck. No adenopathy.  Salivary glands normal. Thyroid normal. Upper chest: Visualized upper chest within normal limits. Visualized lungs are clear. IMPRESSION: 1. 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 Finding is of uncertain etiology, without overt evidence for dissection or other acute abnormality. Dominant left vertebral artery widely patent to the vertebrobasilar junction. 2. Otherwise normal CTA of the neck. Electronically Signed   By: Rise Mu M.D.   On: 04/11/2017 20:35   Mr Angiogram Head W Or Wo Contrast  Result Date: 04/11/2017 CLINICAL DATA:  Ataxia, stroke suspected, dizziness, RIGHT-sided facial numbness for 1 day. EXAM: MRI HEAD WITHOUT CONTRAST MRA HEAD WITHOUT CONTRAST TECHNIQUE: Multiplanar, multiecho pulse sequences of the brain and surrounding structures were obtained without intravenous contrast. Angiographic images of the head were obtained using MRA technique without contrast. COMPARISON:  Normal CT head performed 04/10/2017. FINDINGS: MRI HEAD FINDINGS Brain: Multiple areas of restricted diffusion, corresponding low ADC, subcentimeter in size, affect the RIGHT inferior cerebellum and dorsolateral medulla consistent with an acute RIGHT PICA territory infarct. No visible hemorrhage, mass lesion, hydrocephalus, or extra-axial fluid. Normal for age cerebral volume. No significant white matter disease. Vascular: Absent flow related enhancement in the non dominant RIGHT vertebral artery. Other flow voids are preserved. Skull and upper cervical spine: Normal marrow signal. Sinuses/Orbits: Unremarkable. Other: None. MRA HEAD FINDINGS The internal carotid arteries are widely patent throughout their cervical, petrous, cavernous, supraclinoid, and terminal segments. Normal-appearing anterior and middle cerebral artery segments bilaterally. 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 There is segmental, and thread-like  flow related enhancement, essentially thrombosis, of the visualized V4 segment of the RIGHT vertebral artery. The distal V4 RIGHT vertebral artery is visualized, likely retrograde from the basilar. In addition, there is diminished to absent flow related enhancement in the RIGHT PICA. The other cerebellar branches are preserved.  No saccular aneurysm. 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 Findings discussed with ordering provider. Electronically Signed   By: Elsie Stain M.D.   On: 04/11/2017 19:39   Mr Maxine Glenn Neck W Wo Contrast  Result Date: 04/12/2017 CLINICAL DATA:  Continued surveillance of stroke. RIGHT vertebral occlusion. EXAM: MRI HEAD WITHOUT CONTRAST MRA NECK WITHOUT AND WITH CONTRAST TECHNIQUE: Multiplanar, multiecho pulse sequences of the brain and surrounding structures were obtained without intravenous contrast. Angiographic images of the neck were obtained using MRA technique with and without intravenous contrast. Carotid stenosis measurements (when applicable) are obtained utilizing NASCET criteria, using the distal internal carotid diameter as the denominator. CONTRAST:  15mL MULTIHANCE GADOBENATE DIMEGLUMINE 529 MG/ML IV SOLN COMPARISON:  MR brain 04/11/2017.  CTA head neck 04/11/2017. FINDINGS: MRI HEAD FINDINGS Brain: Unchanged subcentimeter foci of restricted diffusion, RIGHT brainstem and inferior cerebellum, consistent with multifocal infarction in the setting of RIGHT vertebral and RIGHT PICA occlusion. No new areas of concern. Vascular: See below. Skull and upper cervical spine: Normal marrow signal. Sinuses/Orbits: Unremarkable. Other: Incidental note is made of medial positioning of the RIGHT vocal cord. This is likely the result of the lateral medullary infarct. MRA NECK FINDINGS No great vessel stenosis.  Normal transverse arch.  Bovine trunk. Normal  BILATERAL common carotid arteries. Normal BILATERAL carotid bifurcations. No ICA dissection. No changes of fibromuscular dysplasia. Normal BILATERAL subclavian arteries.  Normal vertebral origins. LEFT vertebral dominant and widely patent through the neck. RIGHT vertebral in the neck is diminutive, related to the V3 occlusion at the C1 level. No definitive evidence pointing to dissection is observed, such as fibromuscular disease, or blood products surrounding the vessel; nevertheless, dissection appears to be the most likely etiology. IMPRESSION: Unchanged subcentimeter foci of restricted diffusion consistent with acute nonhemorrhagic infarction, involving the brainstem and inferior cerebellum, RIGHT PICA territory. 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 Of note, a history of neck pain was elicited by the neurologist. Hypercoagulable state resulting in thrombosis is also a consideration, and appropriate lab panel is pending. Except for the RIGHT vertebral occlusion, unremarkable appearing extracranial circulation. Electronically Signed   By: Elsie Stain M.D.   On: 04/12/2017 13:44   Mr Brain Wo Contrast  Result Date: 04/12/2017 CLINICAL DATA:  Continued surveillance of stroke. RIGHT vertebral occlusion. EXAM: MRI HEAD WITHOUT CONTRAST MRA NECK WITHOUT AND WITH CONTRAST TECHNIQUE: Multiplanar, multiecho pulse sequences of the brain and surrounding structures were obtained without intravenous contrast. Angiographic images of the neck were obtained using MRA technique with and without intravenous contrast. Carotid stenosis measurements (when applicable) are obtained utilizing NASCET criteria, using the distal internal carotid diameter as the denominator. CONTRAST:  15mL MULTIHANCE GADOBENATE DIMEGLUMINE 529 MG/ML IV SOLN COMPARISON:  MR brain 04/11/2017.  CTA head neck 04/11/2017.  FINDINGS: MRI HEAD FINDINGS Brain: Unchanged subcentimeter foci of restricted diffusion, RIGHT brainstem and inferior cerebellum, consistent with multifocal infarction in the setting of RIGHT vertebral  and RIGHT PICA occlusion. No new areas of concern. Vascular: See below. Skull and upper cervical spine: Normal marrow signal. Sinuses/Orbits: Unremarkable. Other: Incidental note is made of medial positioning of the RIGHT vocal cord. This is likely the result of the lateral medullary infarct. MRA NECK FINDINGS No great vessel stenosis.  Normal transverse arch.  Bovine trunk. Normal BILATERAL common carotid arteries. Normal BILATERAL carotid bifurcations. No ICA dissection. No changes of fibromuscular dysplasia. Normal BILATERAL subclavian arteries.  Normal vertebral origins. LEFT vertebral dominant and widely patent through the neck. RIGHT vertebral in the neck is diminutive, related to the V3 occlusion at the C1 level. No definitive evidence pointing to dissection is observed, such as fibromuscular disease, or blood products surrounding the vessel; nevertheless, dissection appears to be the most likely etiology. IMPRESSION: Unchanged subcentimeter foci of restricted diffusion consistent with acute nonhemorrhagic infarction, involving the brainstem and inferior cerebellum, RIGHT PICA territory. 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 Of note, a history of neck pain was elicited by the neurologist. Hypercoagulable state resulting in thrombosis is also a consideration, and appropriate lab panel is pending. Except for the RIGHT vertebral occlusion, unremarkable appearing extracranial circulation. Electronically Signed   By: Elsie StainJohn T Curnes M.D.   On: 04/12/2017 13:44   Mr Brain Wo Contrast  Result Date: 04/11/2017 CLINICAL DATA:  Ataxia, stroke suspected, dizziness, RIGHT-sided facial  numbness for 1 day. EXAM: MRI HEAD WITHOUT CONTRAST MRA HEAD WITHOUT CONTRAST TECHNIQUE: Multiplanar, multiecho pulse sequences of the brain and surrounding structures were obtained without intravenous contrast. Angiographic images of the head were obtained using MRA technique without contrast. COMPARISON:  Normal CT head performed 04/10/2017. FINDINGS: MRI HEAD FINDINGS Brain: Multiple areas of restricted diffusion, corresponding low ADC, subcentimeter in size, affect the RIGHT inferior cerebellum and dorsolateral medulla consistent with an acute RIGHT PICA territory infarct. No visible hemorrhage, mass lesion, hydrocephalus, or extra-axial fluid. Normal for age cerebral volume. No significant white matter disease. Vascular: Absent flow related enhancement in the non dominant RIGHT vertebral artery. Other flow voids are preserved. Skull and upper cervical spine: Normal marrow signal. Sinuses/Orbits: Unremarkable. Other: None. MRA HEAD FINDINGS The internal carotid arteries are widely patent throughout their cervical, petrous, cavernous, supraclinoid, and terminal segments. Normal-appearing anterior and middle cerebral artery segments bilaterally. Basilar artery is dolichoectatic but widely patent, with the LEFT vertebral is the sole contributor. There is segmental, and thread-like flow related enhancement, essentially thrombosis, of the visualized V4 segment of the RIGHT vertebral artery. The distal V4 RIGHT vertebral artery is visualized, likely retrograde from the basilar. In addition, there is diminished to absent flow related enhancement in the RIGHT PICA. The other cerebellar branches are preserved.  No saccular aneurysm. 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 Findings discussed with ordering provider. Electronically Signed   By: Elsie StainJohn T Curnes M.D.   On: 04/11/2017 19:39       Medications:     Scheduled Medications: . atomoxetine  10 mg Oral Daily  . FLUoxetine  20 mg Oral Daily  . gadobenate dimeglumine  15 mL Intravenous Once  . multivitamin with minerals  1 tablet Oral Daily  . rosuvastatin  20 mg Oral q1800     Infusions: . sodium chloride    . sodium chloride 75 mL/hr at 04/13/17 1000  . heparin 1,100  Units/hr (04/13/17 1000)     PRN Medications:  acetaminophen **OR** acetaminophen (TYLENOL) oral liquid 160 mg/5 mL **OR** acetaminophen, ibuprofen, ondansetron (ZOFRAN) IV, ondansetron   Assessment:    41 y/o male with ADD, elevated lipoprotein (a) and FHX of hypercoaguable d/o admitted with R cerebellar stroke   Plan/Discussion:    1. Acute 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  - Stable overnight. Still with diplopia and ataxia. I spoke with Dr. Pearlean Brownie who feels this might be chronic dissection with acute closure. Other etiologies include PAF, intracardiac shunting or hypercoaguable d/o.  - Hypercoaguable w/u underway. - Echo reviewed personally and no evidence of embolic source. - TEE and ILF scheduled for tomorrow am. Agree with LE dopplers  - Continue anticoagulation and statin - We will follow closely.     Length of Stay: 2    Arvilla Meres MD 04/13/2017, 10:29 AM  Advanced Heart Failure Team Pager [phone] (M-F; 7a - 4p)  Please contact [facility] for night-coverage after hours (4p -7a ) and weekends on [email or web address]

## 2017-04-13 NOTE — Evaluation (Signed)
Occupational Therapy Evaluation Patient Details Name: Jake Moore MRN: [id number] DOB: 04/10/1976 Today's Date: 04/13/2017    History of Present Illness [name] is a 41 y.o. male with medical history significant of anxiety , hld comes in with over a day of dizziness and worsening right sided facial numbness. 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 Pt currently requiring Min A for UB ADLs, Mod A for LB ADLs, and Mod A +2 for functional mobility due to poor balance, diplopia, and possible central vestibular deficits. Pt presenting with significant change in functional performance and is highly motivated to return to PLOF. Pt will require acute OT to facilitate dc and increase occupational performance. Due to pt's age, deficits, and motivation, recommend dc to CIR for intensive OT to optimize return to PLOF and independence with ADLs and functional mobility.     Follow Up Recommendations  CIR;Supervision/Assistance - 24 hour    Equipment Recommendations  Other (comment)(Defer to next venue)    Recommendations for Other Services PT consult;Rehab consult     Precautions / Restrictions Precautions Precautions: Fall Precaution Comments: Very poor balance Restrictions Weight Bearing Restrictions: No      Mobility Bed Mobility Overal bed mobility: Needs Assistance Bed Mobility: Supine to Sit;Sit to Supine     Supine to sit: Min guard Sit to supine: Min guard   General bed mobility comments: Close guard for safety; Cues to self-monitor for activity tolerance; Reports sensation of falling to the R in sitting EOB; Practiced R elbow prop, which elicited a profound dizziness, and he impulsively laid back down to supine (toward the  L)  Transfers Overall transfer level: Needs assistance Equipment used: 2 person hand held assist Transfers: Sit to/from Stand Sit to Stand: Min assist         General transfer comment: Adequate strength for power up; min assist to steady; wide stance/base of support    Balance Overall balance assessment: Needs assistance Sitting-balance support: Feet supported Sitting balance-Leahy Scale: Fair Sitting balance - Comments: Significantly decr tolerance of R lean/weight shift     Standing balance-Leahy Scale: Poor(Approaching Fair)                             ADL either performed or assessed with clinical judgement   ADL Overall ADL's : Needs assistance/impaired Eating/Feeding: Set up;Supervision/ safety;Sitting   Grooming: Set up;Supervision/safety;Sitting   Upper Body Bathing: Minimal assistance   Lower Body Bathing: Sit to/from stand;+2 for safety/equipment;Moderate assistance   Upper Body Dressing : Minimal assistance;Sitting   Lower Body Dressing: Sit to/from stand;+2 for safety/equipment;Moderate assistance Lower Body Dressing Details (indicate cue type and reason): Pt able to pull up socks. Mod A for dynamic standing balance Toilet Transfer: Moderate assistance;+2 for physical assistance;Ambulation(Simulated to recliner; two person hand held A)           Functional mobility during ADLs: Moderate assistance;+2 for physical assistance(Reported feeling of falling back and to the R) General ADL Comments: Pt demonstrating significant decrease in funcitonal performance compared to baseline. Pt with significant decreased in vision, balance, and coordination. Pt seeing horizontal double vision. Pt leaning on R elbow at EOB, and pt felt like he was falling to the R and fell back on the bed towards the  L.      Vision   Vision Assessment?: Yes Eye Alignment: Within Functional Limits Ocular Range of Motion: Within Functional Limits Additional Comments: R eye  dominant     Perception     Praxis      Pertinent Vitals/Pain Pain Assessment: No/denies pain(Headache prior to session 5/10 pain)     Hand Dominance Right   Extremity/Trunk Assessment Upper Extremity Assessment Upper Extremity Assessment: RUE deficits/detail;LUE deficits/detail RUE Deficits / Details: strength WFL. Decreased coordination possibly due to vestib and vision deficits LUE Deficits / Details: strength WFL. Decreased coordination possibly due to vestib and vision deficits   Lower Extremity Assessment Lower Extremity Assessment: Defer to PT evaluation RLE Deficits / Details: Evidence of decr coordination, with wide stance and decr stance time in gait; Strength grossly 5/5 throughout LLE Deficits / Details: Evidence of decr coordination, with wide stance and decr stance time in gait; Strength grossly 5/5 throughout   Cervical / Trunk Assessment Cervical / Trunk Assessment: Normal   Communication Communication Communication: No difficulties   Cognition Arousal/Alertness: Awake/alert Behavior During Therapy: WFL for tasks assessed/performed Overall Cognitive Status: Within Functional Limits for tasks assessed                                     General Comments  Noted incr BP thoughout session (permissive hypertension in pts with CVA); Denied headache    Exercises     Shoulder Instructions      Home Living Family/patient expects to be discharged to:: Private residence Living Arrangements: Spouse/significant other Available Help at Discharge: Family;Available 24 hours/day Type of Home: House Home Access: Stairs to enter Entergy CorporationEntrance Stairs-Number of Steps: 3 Entrance Stairs-Rails: None Home Layout: Two level;Able to live on main level with bedroom/bathroom     Bathroom Shower/Tub: Producer, television/film/videoWalk-in shower   Bathroom Toilet: Standard     Home Equipment: Shower seat - built in   Additional Comments: Lives with wife and three children      Prior  Functioning/Environment Level of Independence: Independent        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        OT Problem List: Impaired balance (sitting and/or standing);Impaired vision/perception;Decreased knowledge of use of DME or AE;Decreased knowledge of precautions;Decreased activity tolerance      OT Treatment/Interventions: Self-care/ADL training;Therapeutic exercise;Energy conservation;DME and/or AE instruction;Therapeutic activities;Patient/family education    OT Goals(Current goals can be found in the care plan section) Acute Rehab OT Goals Patient Stated Goal: Back to normal OT Goal Formulation: With patient Time For Goal Achievement: 04/27/17 Potential to Achieve Goals: Good ADL Goals Pt Will Perform Grooming: with modified independence;standing Pt Will Perform Upper Body Dressing: with modified independence;standing Pt Will Perform Lower Body Dressing: with modified independence;sit to/from stand Pt Will Transfer to Toilet: with modified independence;regular height toilet;ambulating Additional ADL Goal #1: Pt will verbalize understanding of managing tapping techniques on glasses for double vision with 1-2 VCs.  OT Frequency: Min 3X/week   Barriers to D/C:            Co-evaluation PT/OT/SLP Co-Evaluation/Treatment: Yes Reason for Co-Treatment: Complexity of the patient's impairments (multi-system involvement) PT goals addressed during session: Mobility/safety with mobility;Balance OT goals addressed during session: ADL's and self-care      AM-PAC PT "6 Clicks" Daily Activity     Outcome Measure Help from another person eating meals?: None Help from another person taking care of personal grooming?: A  Little Help from another person toileting, which includes using toliet, bedpan, or urinal?: A Lot Help from another person bathing (including washing, rinsing, drying)?: A Lot Help from another person to put on and taking off regular upper body clothing?: A  Little Help from another person to put on and taking off regular lower body clothing?: A Little 6 Click Score: 17   End of Session Equipment Utilized During Treatment: Gait belt Nurse Communication: Mobility status  Activity Tolerance: Patient tolerated treatment well Patient left: in chair;with call bell/phone within reach;with family/visitor present(with MD)  OT Visit Diagnosis: Unsteadiness on feet (R26.81);Other abnormalities of gait and mobility (R26.89);Other (comment)(Double vision)                Time: [id number] OT Time Calculation (min): 25 min Charges:  OT Evaluation $OT Eval Moderate Complexity: 1 Mod G-Codes:     Javana Schey MSOT, OTR/L Acute Rehab Pager: [phone] Office: [phone]  Theodoro GristCharis M Novi Calia 04/13/2017, 12:23 PM

## 2017-04-13 NOTE — Evaluation (Signed)
Physical Therapy Evaluation Patient Details Name: Jake Moore MRN: [id number] DOB: 01/24/1976 Today's Date: 04/13/2017   History of Present Illness  Jake Moore is a 41 y.o. male with medical history significant of anxiety , hld comes in with over a day of dizziness and worsening right sided facial numbness. 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 Pt currently with functional limitations due to the deficits listed below (see PT Problem List). Independent prior to admission, father of 3, works in Multimedia programmercommercial real estate; Academic librarianresents with significant gait and balance dysfunction, diplopia, and what looks like central vestibular involvement; Pt is highly motivated to work and improve; Will ask for vestibular specialist to work with him; Highly recommend CIR stay to maximize independence and safety with mobility, improve coordination, and decr fall risk;  Pt will benefit from skilled PT to increase their independence and safety with mobility to allow discharge to the venue listed below.       Follow Up Recommendations CIR    Equipment Recommendations  Rolling walker with 5" wheels;3in1 (PT)    Recommendations for Other Services Other (comment)(Vestibular )     Precautions / Restrictions Precautions Precautions: Fall Precaution Comments: Very poor balance Restrictions Weight Bearing Restrictions: No      Mobility  Bed Mobility Overal bed mobility: Needs Assistance Bed Mobility: Supine to Sit;Sit to Supine     Supine to sit: Min guard Sit to supine: Min guard   General bed mobility comments: Close guard for safety; Cues to self-monitor for activity tolerance; Reports sensation of falling to the R in sitting EOB; Practiced R elbow prop, which elicited a profound dizziness, and he impulsively laid back down  to supine (toward the L)  Transfers Overall transfer level: Needs assistance Equipment used: 2 person hand held assist Transfers: Sit to/from Stand Sit to Stand: Min assist         General transfer comment: Adequate strength for power up; min assist to steady; wide stance/base of support  Ambulation/Gait Ambulation/Gait assistance: Mod assist;+2 physical assistance;+2 safety/equipment Ambulation Distance (Feet): 75 Feet Assistive device: 2 person hand held assist Gait Pattern/deviations: Decreased step length - right;Decreased step length - left;Decreased stance time - right;Decreased stance time - left;Decreased weight shift to right;Decreased weight shift to left;Wide base of support     General Gait Details: Short steps with significantly wide stance for stability, and heavy dependence on bilateral handheld assist   Stairs            Wheelchair Mobility    Modified Rankin (Stroke Patients Only)       Balance Overall balance assessment: Needs assistance Sitting-balance support: Feet supported Sitting balance-Leahy Scale: Fair Sitting balance - Comments: Significantly decr tolerance of R lean/weight shift     Standing balance-Leahy Scale: Poor(Approaching Fair)                               Pertinent Vitals/Pain Pain Assessment: No/denies pain(Headache prior to session 5/10 pain)    Home Living Family/patient expects to be discharged to:: Private residence Living Arrangements: Spouse/significant other Available Help at Discharge: Family;Available 24 hours/day Type of Home: House Home Access: Stairs to enter Entrance Stairs-Rails: None Entrance Stairs-Number of Steps: 3 Home Layout: Two level;Able to live on main level with bedroom/bathroom Home Equipment: Shower seat - built in  Prior Function Level of Independence: Independent         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     Hand Dominance   Dominant Hand: Right     Extremity/Trunk Assessment   Upper Extremity Assessment Upper Extremity Assessment: Defer to OT evaluation    Lower Extremity Assessment Lower Extremity Assessment: RLE deficits/detail;LLE deficits/detail RLE Deficits / Details: Evidence of decr coordination, with wide stance and decr stance time in gait; Strength grossly 5/5 throughout LLE Deficits / Details: Evidence of decr coordination, with wide stance and decr stance time in gait; Strength grossly 5/5 throughout    Cervical / Trunk Assessment Cervical / Trunk Assessment: Normal  Communication   Communication: No difficulties  Cognition Arousal/Alertness: Awake/alert Behavior During Therapy: WFL for tasks assessed/performed Overall Cognitive Status: Within Functional Limits for tasks assessed                                        General Comments General comments (skin integrity, edema, etc.): Noted incr BP thoughout session (permissive hypertension in pts with CVA); Denied headache    Exercises     Assessment/Plan    PT Assessment Patient needs continued PT services  PT Problem List Decreased activity tolerance;Decreased balance;Decreased mobility;Decreased coordination;Decreased knowledge of use of DME;Decreased knowledge of precautions;Other (comment)(Vestibular dysfunction)       PT Treatment Interventions DME instruction;Gait training;Stair training;Functional mobility training;Therapeutic activities;Therapeutic exercise;Balance training;Neuromuscular re-education;Patient/family education    PT Goals (Current goals can be found in the Care Plan section)  Acute Rehab PT Goals Patient Stated Goal: Back to normal PT Goal Formulation: With patient Time For Goal Achievement: 04/27/17 Potential to Achieve Goals: Good    Frequency Min 4X/week   Barriers to discharge        Co-evaluation PT/OT/SLP Co-Evaluation/Treatment: Yes Reason for Co-Treatment: Complexity of the patient's impairments  (multi-system involvement);For patient/therapist safety PT goals addressed during session: Mobility/safety with mobility;Balance         AM-PAC PT "6 Clicks" Daily Activity  Outcome Measure Difficulty turning over in bed (including adjusting bedclothes, sheets and blankets)?: A Little Difficulty moving from lying on back to sitting on the side of the bed? : A Little Difficulty sitting down on and standing up from a chair with arms (e.g., wheelchair, bedside commode, etc,.)?: Unable Help needed moving to and from a bed to chair (including a wheelchair)?: A Lot Help needed walking in hospital room?: A Lot Help needed climbing 3-5 steps with a railing? : A Lot 6 Click Score: 13    End of Session Equipment Utilized During Treatment: Gait belt Activity Tolerance: Patient tolerated treatment well Patient left: in chair;with call bell/phone within reach;with family/visitor present;Other (comment)(Stroke Team present for exam) Nurse Communication: Mobility status PT Visit Diagnosis: Unsteadiness on feet (R26.81);Other abnormalities of gait and mobility (R26.89);Ataxic gait (R26.0);Other symptoms and signs involving the nervous system (R29.898);Dizziness and giddiness (R42)    Time: [id number] PT Time Calculation (min) (ACUTE ONLY): 32 min   Charges:   PT Evaluation $PT Eval Moderate Complexity: 1 Mod     PT G Codes:        Van ClinesHolly Allean Montfort, PT  Acute Rehabilitation Services Pager [phone] Office [phone]   Levi AlandHolly H Brenae Lasecki 04/13/2017, 10:15 AM

## 2017-04-13 NOTE — Progress Notes (Signed)
ANTICOAGULATION CONSULT NOTE  Pharmacy Consult for heparin Indication: vertebral artery occlusion  Patient Measurements: Height: 5\' 10"  (177.8 cm) Weight: 164 lb 14.5 oz (74.8 kg) IBW/kg (Calculated) : 73 Heparin Dosing Weight: 74.8kg  Labs: Recent Labs    04/10/17 1430 04/11/17 1910 04/12/17 1456 04/13/17 0320 04/13/17 1208  HGB 15.4 14.0  --  14.2  --   HCT 44.0 41.5  --  42.0  --   PLT 159 153  --  149*  --   HEPARINUNFRC  --   --  0.34 0.25* 0.62  CREATININE 0.95 0.92  --  0.77  --    Estimated Creatinine Clearance: 126.7 mL/min (by C-G formula based on SCr of 0.77 mg/dL).  Assessment: Jake Moore is a 41 y/o M who presented with worsening dizziness, gait imbalance, and progressive right-sided facial numbness. CTA shows occlusion of the right vertebral artery. Pt continues on IV heparin. Heparin level is now slightly elevated at 0.62. No bleeding noted.   Goal of Therapy:  Heparin level 0.3-0.5 Monitor platelets by anticoagulation protocol: Yes   Plan:  Reduce heparin gtt to 1050 units/hr Check an 8 hr heparin level Daily heparin level and CBC  Lysle Pearlachel Bellany Elbaum, PharmD, BCPS 04/13/2017 1:59 PM

## 2017-04-13 NOTE — Progress Notes (Signed)
STROKE TEAM PROGRESS NOTE   HISTORY OF PRESENT ILLNESS (per record) Jake Moore is an 41 y.o. male with HLD, family history significant for blood clots, and prior tobacco use presents to the emergency room after being discharged yesterday for worsening dizziness, gait imbalance nausea headache and progressive of right-sided facial numbness. He came with same symptoms yesterday that started at 8:30 in the morning. During that visit his CT head was done and patient was given meclizine. Reviewing of the note is unclear if the patient was made to walk prior to discharge. Patient states that symptoms worsened yesterday at 11 PM and numbness over his face extended from nose to the right half of his face.   He states that he lifted weights the night before but denies recent chiropractic manipulation, twisting of his neck. He does complain of neck pain on the left side. His sister and mother had multiple clots. His sister event extensive evaluation including at [facility] and [facility] known to have higher levels of factor VIII and elevated lipoprotein A levels. He takes crestor at home, not on ASA.  Date last known well: 12.20.18 Time last known well: 8.15 am tPA Given: outside window NIHSS: 1 Baseline MRS 0      SUBJECTIVE (INTERVAL HISTORY) His wife and father are at the bedside.  Patient reports  slight improvement in ataxia but persistent diplopia. Numbness has improved. Repeat MRI scan of the brain yesterday showed stable appearance of the right lateral medullary and cerebellar infarcts. Right vertebral artery appears occluded in V3 and V4 segments. Patient does give history of heavy weight lifting and chronic neck pain with some recent worsening Discussed imaging, answered all questions. He has no history of deep and thrombosis, pulmonary embolism but does have family history of hypercoagulability in her sister who has elevated factor VIII and is on long-term anticoagulation for pulmonary  embolism Heparin drip started yesterday due to worsening symptoms but repeat MRI showed no extension of stroke and level this morning is 0.25 OBJECTIVE Temp:  [97.7 F (36.5 C)-97.9 F (36.6 C)] 97.8 F (36.6 C) (12/23 0800) Pulse Rate:  [63-88] 82 (12/23 1100) Cardiac Rhythm: Normal sinus rhythm (12/23 0800) Resp:  [9-18] 12 (12/23 1100) BP: (103-156)/(81-110) 120/88 (12/23 1100) SpO2:  [97 %-100 %] 100 % (12/23 1100)  CBC:  Recent Labs  Lab 04/10/17 1430 04/11/17 1910 04/13/17 0320  WBC 6.1 8.3 6.0  NEUTROABS 4.4 6.5  --   HGB 15.4 14.0 14.2  HCT 44.0 41.5 42.0  MCV 90.3 91.8 91.9  PLT 159 153 149*    Basic Metabolic Panel:  Recent Labs  Lab 04/11/17 1910 04/13/17 0320  NA 139 137  K 4.3 3.4*  CL 111 108  CO2 24 24  GLUCOSE 120* 111*  BUN 8 6  CREATININE 0.92 0.77  CALCIUM 8.4* 8.5*    Lipid Panel:     Component Value Date/Time   CHOL 141 04/12/2017 0254   TRIG 65 04/12/2017 0254   HDL 71 04/12/2017 0254   CHOLHDL 2.0 04/12/2017 0254   VLDL 13 04/12/2017 0254   LDLCALC 57 04/12/2017 0254   HgbA1c:  Lab Results  Component Value Date   HGBA1C 5.2 04/12/2017   Urine Drug Screen: No results found for: LABOPIA, COCAINSCRNUR, LABBENZ, AMPHETMU, THCU, LABBARB  Alcohol Level No results found for: ETH  IMAGING  Ct Head Wo Contrast 04/10/2017 IMPRESSION:  Normal head CT.    Ct Angio Neck W Or Wo Contrast 04/11/2017 IMPRESSION:  1.  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 Finding is of uncertain etiology, without overt evidence for dissection or other acute abnormality. Dominant left vertebral artery widely patent to the vertebrobasilar junction.  2. Otherwise normal CTA of the neck.    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    Mr Brain 29Wo  Contrast Mr Maxine GlennMra Neck W Wo Contrast 04/12/2017 IMPRESSION:  Unchanged subcentimeter foci of restricted diffusion consistent with acute nonhemorrhagic infarction, involving the brainstem and inferior cerebellum, RIGHT PICA territory. 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 Of note, a history of neck pain was elicited by the neurologist. Hypercoagulable state resulting in thrombosis is also a consideration, and appropriate lab panel is pending. Except for the RIGHT vertebral occlusion, unremarkable appearing extracranial circulation.    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     Transthoracic Echocardiogram -Normal LV systolic and diastolic function; trace AI; mild TR      PHYSICAL EXAM Vitals:   04/13/17 0800 04/13/17 0900 04/13/17 1000 04/13/17 1100  BP: (!) 132/100 (!) 138/110 103/81 120/88  Pulse: 66 88 80 82  Resp: 14 18 16 12   Temp: 97.8 F (36.6 C)     TempSrc: Oral     SpO2: 99% 100% 100% 100%  Weight:      Height:        PHYSICAL EXAM Physical exam: Exam: Gen: NAD Eyes: anicteric sclerae, moist conjunctivae                    CV: no MRG, no carotid bruits, no peripheral edema Mental Status: Alert, follows commands, good historian  Neuro: Detailed Neurologic Exam  Speech:    No aphasia, no dysarthria, luent speech  Cranial Nerves:    Right horner Syndrome.   , 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  EOMI.  Visual fields full. Right facial numbness. Face symmetric, Tongue midline. Hearing intact to voice. Shoulder shrug intact  Motor Observation:    no involuntary movements noted. Tone appears normal.     Strength:    5/5     Sensation:  Left arm>leg numbness Coordination : mild right  finger to nose ataxia  Plantars downgoing.  Gait unsteady with ataxia   ASSESSMENT/PLAN Mr. Christen BameRobert F Woodrow is a 41 y.o. male with history of anxiety, tobacco use, hyperlipidemia, and family history significant for blood clots presenting after recent discharge 04/10/17 for dizziness, gait imbalance, nausea, headache, and progressive of right-sided facial numbness. He returned with worsening of his deficits. He did not receive IV t-PA due to late presentation.  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   MRI head - Acute nonhemorrhagic subcentimeter Rt inferior cerebellar and Rt medullary infarcts.  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  Carotid Doppler - CTA neck  2D Echo - Normal LV systolic and diastolic function; trace AI; mild TRLDL - 57  HgbA1c - 5.2  VTE prophylaxis - IV heparin Diet regular Room service appropriate? Yes; Fluid consistency: Thin  No antithrombotic prior to admission, now on heparin IV  Patient counseled to be compliant with his antithrombotic medications  Ongoing aggressive stroke risk factor management  Therapy recommendations:  pending  Disposition:  Pending  Hypertension  Stable  Permissive hypertension (OK if < 220/120) but gradually normalize in 5-7 days  Long-term BP goal normotensive  Hyperlipidemia  Home meds:  Crestor 10 mg daily PTA  LDL 57, goal < 70  Crestor increased to 20 mg daily  Continue statin at discharge   Other Stroke Risk Factors  Former cigarette smoker - quit 14 months ago  ETOH use, advised to drink no more than 1 drink per day.  Family history of clotting disorder-Factor 8 excess  Other Active  Problems  Anxiety   Plan / Recommendations  Hospital day # 2 Continue IV heparin drip till stroke workup is completed. Check lower extremely venous Doppler today for DVT and transesophageal echocardiogram yesterday for cardiac source of embolism, PFO and if negative loop recorder for long-term monitoring for paroxysmal A. fib. If above workup is on negative will discontinue IV heparin and changed to aspirin and Plavix for 3 months. Long discussion the patient, wife and father and Dr. Myra GianottiBrabham and Bensimhon and answered questions This patient is critically ill and at significant risk of neurological worsening, death and care requires constant monitoring of vital signs, hemodynamics,respiratory and cardiac monitoring, extensive review of multiple databases, frequent neurological assessment, discussion with family, other specialists and medical decision making of high complexity.I have made any additions or clarifications directly to the above note.This critical care time does not reflect procedure time, or teaching time or supervisory time of PA/NP/Med Resident etc but could involve care discussion time.  I spent 30 minutes of neurocritical care time  in the care of  this patient.      Delia HeadyPramod Camron Essman, MD Stroke Neurology  To contact Stroke Continuity provider, please refer to [email or web address]. After hours, contact General Neurology

## 2017-04-13 NOTE — Progress Notes (Signed)
Pt woke up complaining of a severe headache in the back of his head, new onset nystagmus and some slight nausea.  Dr. Laurence SlateAroor notified and a STAT head CT was ordered.  Will continue to monitor for any further changes.

## 2017-04-14 ENCOUNTER — Encounter (HOSPITAL_COMMUNITY): Payer: Self-pay | Admitting: Nurse Practitioner

## 2017-04-14 ENCOUNTER — Inpatient Hospital Stay (HOSPITAL_COMMUNITY): Payer: BLUE CROSS/BLUE SHIELD

## 2017-04-14 ENCOUNTER — Encounter (HOSPITAL_COMMUNITY)
Admission: EM | Disposition: A | Discharge status: Rehabilitation Facility | Payer: Self-pay | Source: Home / Self Care | Attending: Neurology

## 2017-04-14 ENCOUNTER — Inpatient Hospital Stay (HOSPITAL_COMMUNITY): Payer: BLUE CROSS/BLUE SHIELD | Admitting: Certified Registered"

## 2017-04-14 DIAGNOSIS — E876 Hypokalemia: Secondary | ICD-10-CM

## 2017-04-14 DIAGNOSIS — I639 Cerebral infarction, unspecified: Secondary | ICD-10-CM

## 2017-04-14 DIAGNOSIS — I6300 Cerebral infarction due to thrombosis of unspecified precerebral artery: Secondary | ICD-10-CM

## 2017-04-14 DIAGNOSIS — Q211 Atrial septal defect: Secondary | ICD-10-CM

## 2017-04-14 DIAGNOSIS — F411 Generalized anxiety disorder: Secondary | ICD-10-CM

## 2017-04-14 DIAGNOSIS — F988 Other specified behavioral and emotional disorders with onset usually occurring in childhood and adolescence: Secondary | ICD-10-CM

## 2017-04-14 DIAGNOSIS — I63012 Cerebral infarction due to thrombosis of left vertebral artery: Secondary | ICD-10-CM

## 2017-04-14 HISTORY — PX: TEE WITHOUT CARDIOVERSION: SHX5443

## 2017-04-14 LAB — LUPUS ANTICOAGULANT PANEL
DRVVT: 33 s (ref 0.0–47.0)
DRVVT: 35.4 s (ref 0.0–47.0)
PTT LA: 27.3 s (ref 0.0–51.9)
PTT LA: 55.2 s — AB (ref 0.0–51.9)

## 2017-04-14 LAB — CBC
HCT: 40.1 % (ref 39.0–52.0)
HEMOGLOBIN: 13.7 g/dL (ref 13.0–17.0)
MCH: 31.2 pg (ref 26.0–34.0)
MCHC: 34.2 g/dL (ref 30.0–36.0)
MCV: 91.3 fL (ref 78.0–100.0)
Platelets: 165 10*3/uL (ref 150–400)
RBC: 4.39 MIL/uL (ref 4.22–5.81)
RDW: 12.2 % (ref 11.5–15.5)
WBC: 6.5 10*3/uL (ref 4.0–10.5)

## 2017-04-14 LAB — PTT-LA MIX: PTT-LA Mix: 51.2 s — ABNORMAL HIGH (ref 0.0–48.9)

## 2017-04-14 LAB — HOMOCYSTEINE: Homocysteine: 7.5 umol/L (ref 0.0–15.0)

## 2017-04-14 LAB — HEXAGONAL PHASE PHOSPHOLIPID: HEXAGONAL PHASE PHOSPHOLIPID: 4 s (ref 0–11)

## 2017-04-14 LAB — HEPARIN LEVEL (UNFRACTIONATED): Heparin Unfractionated: 0.4 IU/mL (ref 0.30–0.70)

## 2017-04-14 SURGERY — LOOP RECORDER INSERTION
Anesthesia: LOCAL

## 2017-04-14 SURGERY — ECHOCARDIOGRAM, TRANSESOPHAGEAL
Anesthesia: Monitor Anesthesia Care

## 2017-04-14 MED ORDER — FENTANYL CITRATE (PF) 250 MCG/5ML IJ SOLN
INTRAMUSCULAR | Status: DC | PRN
  Administered 2017-04-14 (×2): 50 ug via INTRAVENOUS

## 2017-04-14 MED ORDER — CLOPIDOGREL BISULFATE 75 MG PO TABS
75.0000 mg | ORAL_TABLET | Freq: Every day | ORAL | Status: DC
  Administered 2017-04-14 – 2017-04-17 (×4): 75 mg via ORAL
  Filled 2017-04-14 (×4): qty 1

## 2017-04-14 MED ORDER — LIDOCAINE HCL (CARDIAC) 20 MG/ML IV SOLN
INTRAVENOUS | Status: DC | PRN
  Administered 2017-04-14: 60 mg via INTRAVENOUS

## 2017-04-14 MED ORDER — BUTAMBEN-TETRACAINE-BENZOCAINE 2-2-14 % EX AERO
INHALATION_SPRAY | CUTANEOUS | Status: DC | PRN
  Administered 2017-04-14: 2 via TOPICAL

## 2017-04-14 MED ORDER — PROPOFOL 500 MG/50ML IV EMUL
INTRAVENOUS | Status: DC | PRN
  Administered 2017-04-14: 120 ug/kg/min via INTRAVENOUS

## 2017-04-14 MED ORDER — SODIUM CHLORIDE 0.9 % IV SOLN: INTRAVENOUS | Status: DC

## 2017-04-14 MED ORDER — FENTANYL CITRATE (PF) 100 MCG/2ML IJ SOLN
INTRAMUSCULAR | Status: AC
  Filled 2017-04-14: qty 2

## 2017-04-14 MED ORDER — ASPIRIN 325 MG PO TABS
325.0000 mg | ORAL_TABLET | Freq: Every day | ORAL | Status: DC
  Administered 2017-04-14 – 2017-04-17 (×4): 325 mg via ORAL
  Filled 2017-04-14 (×4): qty 1

## 2017-04-14 MED ORDER — MIDAZOLAM HCL 5 MG/ML IJ SOLN
INTRAMUSCULAR | Status: AC
  Filled 2017-04-14: qty 2

## 2017-04-14 MED ORDER — MIDAZOLAM HCL 5 MG/ML IJ SOLN
INTRAMUSCULAR | Status: AC
  Filled 2017-04-14: qty 1

## 2017-04-14 MED ORDER — MIDAZOLAM HCL 2 MG/2ML IJ SOLN
INTRAMUSCULAR | Status: DC | PRN
  Administered 2017-04-14: 2 mg via INTRAVENOUS
  Administered 2017-04-14: 1 mg via INTRAVENOUS
  Administered 2017-04-14: 2 mg via INTRAVENOUS

## 2017-04-14 NOTE — Progress Notes (Signed)
Inpatient Rehabilitation  Inpatient Rehabilitation  Please refer to consult by Dr. Allena KatzPatel for full details; recommends an IP Rehab admission after completion of medical work-up.  Given that this patient has [employer] and they are closed we cannot start authorization until 04/16/17.  If patient remains in house plan to follow up then.    Charlane FerrettiMelissa Neeka Urista, M.A., CCC/SLP Admission Coordinator  [facility]  Cell [phone]

## 2017-04-14 NOTE — Progress Notes (Signed)
Dr. Pearlean BrownieSethi and code nurse at bedside with Dr. Gala RomneyBensimhon, code stroke cancelled per MD order.

## 2017-04-14 NOTE — Progress Notes (Signed)
PT Cancellation Note  Patient Details Name: Christen BameRobert F Paganelli MRN: [id number] DOB: 12/31/1975   Cancelled Treatment:    Reason Eval/Treat Not Completed: Patient at procedure or test/unavailable.  Pt in endoscopy.  PT to check back later today as time allows.  If not today PT will resume 04-16-17.  Thanks,    Rollene Rotundaebecca B. Nathaneil Feagans, PT, DPT [phone]   04/14/2017, 9:51 AM

## 2017-04-14 NOTE — Progress Notes (Signed)
  Echocardiogram Echocardiogram Transesophageal has been performed.  Pieter PartridgeBrooke S Quinnley Colasurdo 04/14/2017, 11:41 AM

## 2017-04-14 NOTE — Progress Notes (Signed)
Code Stroke called @ 1042. Dr. Desmond Lopeurk and Dr. Gala RomneyBensimhon at bedside. Dr. Pearlean BrownieSethi paged from procedure room.

## 2017-04-14 NOTE — Progress Notes (Signed)
TCD bubble study completed. Positive for small PFO.  LE venous duplex prelim: negative for DVT.  Farrel DemarkJill Eunice, RDMS, RVT

## 2017-04-14 NOTE — Consult Note (Addendum)
Physical Medicine and Rehabilitation Consult Reason for Consult: Right side weakness Referring Physician: Dr. Pearlean BrownieSethi   HPI: Christen BameRobert F Kienle is a 41 y.o. right handed male with history of anxiety/ADD,, hyperlipidemia, quit smoking 14 months ago. Per chart review and patient, patient lives with spouse. Independent prior to admission. Works in Clinical research associatecommercial real estate. Two-level home with bedroom on main and 3 steps to entry. Wife can assist as needed. Presented 04/11/2017 with dizziness, headache and right-sided facial numbness. Cranial CT scan reviewed, unremarkable for acute process. 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 MRA/CT angiogram the neck with right vertebral artery right PICA occlusion. Patient did not receive TPA. Vascular surgery consulted for possible dissection that was ruled out. Echocardiogram with ejection fraction of 60% no wall motion abnormalities. Neurology consulted placed on intravenous heparin. Venous Dopplers lower extremities pending. Hypercoag workup ongoing. Plan for TEE. Code stroke called earlier today. Physical therapy evaluation completed with recommendations of physical medicine rehabilitation consult.   Review of Systems  Constitutional: Negative for chills and fever.  HENT: Negative for hearing loss.   Eyes: Positive for blurred vision and double vision.  Respiratory: Negative for cough and shortness of breath.   Cardiovascular: Negative for chest pain, palpitations and leg swelling.  Gastrointestinal: Positive for constipation. Negative for nausea and vomiting.  Genitourinary: Negative for flank pain and hematuria.  Skin: Negative for rash.  Neurological: Positive for dizziness, sensory change and headaches. Negative for seizures.  Psychiatric/Behavioral:       Anxiety  All other systems reviewed and are negative.  Past Medical History:  Diagnosis Date  . Anxiety    No past surgical  history of stroke. No pertinent past family history of premature CVA. Social History:  reports that he quit smoking about 14 months ago. His smoking use included cigarettes. He quit after 20.00 years of use. He has quit using smokeless tobacco. His smokeless tobacco use included chew. He reports that he drinks about 12.0 oz of alcohol per week. He reports that he does not use drugs. Allergies:  Allergies  Allergen Reactions  . Penicillins Hives    Has patient had a PCN reaction causing immediate rash, facial/tongue/throat swelling, SOB or lightheadedness with hypotension: YES Has patient had a PCN reaction causing severe rash involving mucus membranes or skin necrosis: NO Has patient had a PCN reaction that required hospitalizationNO Has patient had a PCN reaction occurring within the last 10 years: NO If all of the above answers are "NO", then may proceed with Cephalosporin use.   Medications Prior to Admission  Medication Sig Dispense Refill  . atomoxetine (STRATTERA) 10 MG capsule Take 10 mg by mouth daily.    Marland Kitchen. FLUoxetine (PROZAC) 20 MG tablet Take 20 mg by mouth daily.    Marland Kitchen. ibuprofen (ADVIL,MOTRIN) 200 MG tablet Take 200 mg by mouth every 6 (six) hours as needed for moderate pain.    . meclizine (ANTIVERT) 25 MG tablet Take 1 tablet (25 mg total) by mouth 3 (three) times daily as needed for dizziness. 15 tablet 0  . Multiple Vitamin (MULTIVITAMIN) tablet Take 1 tablet by mouth daily.    . ondansetron (ZOFRAN) 8 MG tablet Take 1 tablet (8 mg total) by mouth every 8 (eight) hours as needed for nausea or vomiting. 10 tablet 0  . rosuvastatin (CRESTOR) 10 MG tablet Take 10 mg by mouth daily.    . naproxen (NAPROSYN) 500 MG tablet Take 1 tablet (500 mg total)  by mouth 2 (two) times daily. (Patient not taking: Reported on 07/14/2016) 30 tablet 0    Home: Home Living Family/patient expects to be discharged to:: Private residence Living Arrangements: Spouse/significant other Available Help  at Discharge: Family, Available 24 hours/day Type of Home: House Home Access: Stairs to enter Entergy CorporationEntrance Stairs-Number of Steps: 3 Entrance Stairs-Rails: None Home Layout: Two level, Able to live on main level with bedroom/bathroom Bathroom Shower/Tub: Health visitorWalk-in shower Bathroom Toilet: Standard Home Equipment: Shower seat - built in Additional Comments: Lives with wife and three children  Lives With: Spouse  Functional History: Prior Function Level of Independence: Independent 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 Functional Status:  Mobility: Bed Mobility Overal bed mobility: Needs Assistance Bed Mobility: Supine to Sit, Sit to Supine Supine to sit: Min guard Sit to supine: Min guard General bed mobility comments: Close guard for safety; Cues to self-monitor for activity tolerance; Reports sensation of falling to the R in sitting EOB; Practiced R elbow prop, which elicited a profound dizziness, and he impulsively laid back down to supine (toward the L) Transfers Overall transfer level: Needs assistance Equipment used: 2 person hand held assist Transfers: Sit to/from Stand Sit to Stand: Min assist General transfer comment: Adequate strength for power up; min assist to steady; wide stance/base of support Ambulation/Gait Ambulation/Gait assistance: Mod assist, +2 physical assistance, +2 safety/equipment Ambulation Distance (Feet): 75 Feet Assistive device: 2 person hand held assist Gait Pattern/deviations: Decreased step length - right, Decreased step length - left, Decreased stance time - right, Decreased stance time - left, Decreased weight shift to right, Decreased weight shift to left, Wide base of support General Gait Details: Short steps with significantly wide stance for stability, and heavy dependence on bilateral handheld assist     ADL: ADL Overall ADL's : Needs assistance/impaired Eating/Feeding: Set up, Supervision/ safety, Sitting Grooming: Set up,  Supervision/safety, Sitting Upper Body Bathing: Minimal assistance Lower Body Bathing: Sit to/from stand, +2 for safety/equipment, Moderate assistance Upper Body Dressing : Minimal assistance, Sitting Lower Body Dressing: Sit to/from stand, +2 for safety/equipment, Moderate assistance Lower Body Dressing Details (indicate cue type and reason): Pt able to pull up socks. Mod A for dynamic standing balance Toilet Transfer: Moderate assistance, +2 for physical assistance, Ambulation(Simulated to recliner; two person hand held A) Functional mobility during ADLs: Moderate assistance, +2 for physical assistance(Reported feeling of falling back and to the R) General ADL Comments: Focusing session on education on double vision and providing tapping as compensatory strategy for double vision. After placing tape on glasses, pt stating double vision was reduced and he was able to read text messages on his phone.  Cognition: Cognition Overall Cognitive Status: Impaired/Different from baseline Arousal/Alertness: Awake/alert Orientation Level: Oriented X4 Attention: Focused, Sustained, Selective Focused Attention: Appears intact Sustained Attention: Appears intact Selective Attention: Appears intact Memory: Impaired Memory Impairment: Other (comment)(delayed recall 4/5) Awareness: Appears intact Problem Solving: Impaired Problem Solving Impairment: Verbal complex(4/5 serial 7s) Safety/Judgment: Appears intact Cognition Arousal/Alertness: Awake/alert Behavior During Therapy: WFL for tasks assessed/performed Overall Cognitive Status: Impaired/Different from baseline  Blood pressure 125/86, pulse 74, temperature 97.7 F (36.5 C), temperature source Oral, resp. rate (!) 8, height 5\' 10"  (1.778 m), weight 74.8 kg (164 lb 14.5 oz), SpO2 98 %. Physical Exam  Vitals reviewed. Constitutional: He is oriented to person, place, and time. He appears well-developed and well-nourished.  HENT:  Head:  Normocephalic and atraumatic.  Eyes: EOM are normal. Right eye exhibits no discharge. Left eye exhibits no discharge.  Neck: Normal  range of motion. Neck supple. No thyromegaly present.  Cardiovascular: Normal rate, regular rhythm and normal heart sounds.  Respiratory: Effort normal and breath sounds normal. No respiratory distress.  GI: Soft. Bowel sounds are normal. He exhibits no distension.  Musculoskeletal: He exhibits no edema or tenderness.  Neurological: He is alert and oriented to person, place, and time.  Follows full commands Sensation diminished right face Mild ataxia LUE Motor: RUE/RLE: 4+/5 proximal to distal LUE/LLE: 5/5 proximal to distal  Skin: Skin is warm and dry.  Psychiatric: He has a normal mood and affect. His behavior is normal. Thought content normal.    Results for orders placed or performed during the hospital encounter of 04/11/17 (from the past 24 hour(s))  Heparin level (unfractionated)     Status: None   Collection Time: 04/13/17 12:08 PM  Result Value Ref Range   Heparin Unfractionated 0.62 0.30 - 0.70 IU/mL  Heparin level (unfractionated)     Status: None   Collection Time: 04/13/17 10:33 PM  Result Value Ref Range   Heparin Unfractionated 0.40 0.30 - 0.70 IU/mL  Heparin level (unfractionated)     Status: None   Collection Time: 04/14/17  3:01 AM  Result Value Ref Range   Heparin Unfractionated 0.40 0.30 - 0.70 IU/mL  CBC     Status: None   Collection Time: 04/14/17  3:01 AM  Result Value Ref Range   WBC 6.5 4.0 - 10.5 K/uL   RBC 4.39 4.22 - 5.81 MIL/uL   Hemoglobin 13.7 13.0 - 17.0 g/dL   HCT 81.1 91.4 - 78.2 %   MCV 91.3 78.0 - 100.0 fL   MCH 31.2 26.0 - 34.0 pg   MCHC 34.2 30.0 - 36.0 g/dL   RDW 95.6 21.3 - 08.6 %   Platelets 165 150 - 400 K/uL   Ct Head Wo Contrast  Result Date: 04/13/2017 CLINICAL DATA:  Headache and nystagmus. Recent small cerebellar infarct. EXAM: CT HEAD WITHOUT CONTRAST TECHNIQUE: Contiguous axial images were  obtained from the base of the skull through the vertex without intravenous contrast. COMPARISON:  Brain MRI 04/12/2017 FINDINGS: Brain: No mass lesion, intraparenchymal hemorrhage or extra-axial collection. Focal area of hypoattenuation in the right cerebellum corresponds to the infarct seen on MRI. Brain parenchyma and CSF-containing spaces are normal for age. Vascular: No hyperdense vessel or unexpected calcification. Skull: Normal visualized skull base, calvarium and extracranial soft tissues. Sinuses/Orbits: No sinus fluid levels or advanced mucosal thickening. No mastoid effusion. Normal orbits. IMPRESSION: 1. Expected appearance of small right cerebellar infarct, without hemorrhage. No mass effect. 2. Otherwise normal brain. Electronically Signed   By: Deatra Robinson M.D.   On: 04/13/2017 03:06   Mr Maxine Glenn Neck W Wo Contrast  Result Date: 04/12/2017 CLINICAL DATA:  Continued surveillance of stroke. RIGHT vertebral occlusion. EXAM: MRI HEAD WITHOUT CONTRAST MRA NECK WITHOUT AND WITH CONTRAST TECHNIQUE: Multiplanar, multiecho pulse sequences of the brain and surrounding structures were obtained without intravenous contrast. Angiographic images of the neck were obtained using MRA technique with and without intravenous contrast. Carotid stenosis measurements (when applicable) are obtained utilizing NASCET criteria, using the distal internal carotid diameter as the denominator. CONTRAST:  15mL MULTIHANCE GADOBENATE DIMEGLUMINE 529 MG/ML IV SOLN COMPARISON:  MR brain 04/11/2017.  CTA head neck 04/11/2017. FINDINGS: MRI HEAD FINDINGS Brain: Unchanged subcentimeter foci of restricted diffusion, RIGHT brainstem and inferior cerebellum, consistent with multifocal infarction in the setting of RIGHT vertebral and RIGHT PICA occlusion. No new areas of concern. Vascular: See below. Skull  and upper cervical spine: Normal marrow signal. Sinuses/Orbits: Unremarkable. Other: Incidental note is made of medial positioning of the  RIGHT vocal cord. This is likely the result of the lateral medullary infarct. MRA NECK FINDINGS No great vessel stenosis.  Normal transverse arch.  Bovine trunk. Normal BILATERAL common carotid arteries. Normal BILATERAL carotid bifurcations. No ICA dissection. No changes of fibromuscular dysplasia. Normal BILATERAL subclavian arteries.  Normal vertebral origins. LEFT vertebral dominant and widely patent through the neck. RIGHT vertebral in the neck is diminutive, related to the V3 occlusion at the C1 level. No definitive evidence pointing to dissection is observed, such as fibromuscular disease, or blood products surrounding the vessel; nevertheless, dissection appears to be the most likely etiology. IMPRESSION: Unchanged subcentimeter foci of restricted diffusion consistent with acute nonhemorrhagic infarction, involving the brainstem and inferior cerebellum, RIGHT PICA territory. 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 Of note, a history of neck pain was elicited by the neurologist. Hypercoagulable state resulting in thrombosis is also a consideration, and appropriate lab panel is pending. Except for the RIGHT vertebral occlusion, unremarkable appearing extracranial circulation. Electronically Signed   By: Elsie Stain M.D.   On: 04/12/2017 13:44   Mr Brain Wo Contrast  Result Date: 04/12/2017 CLINICAL DATA:  Continued surveillance of stroke. RIGHT vertebral occlusion. EXAM: MRI HEAD WITHOUT CONTRAST MRA NECK WITHOUT AND WITH CONTRAST TECHNIQUE: Multiplanar, multiecho pulse sequences of the brain and surrounding structures were obtained without intravenous contrast. Angiographic images of the neck were obtained using MRA technique with and without intravenous contrast. Carotid stenosis measurements (when applicable) are obtained utilizing NASCET criteria, using the distal internal  carotid diameter as the denominator. CONTRAST:  15mL MULTIHANCE GADOBENATE DIMEGLUMINE 529 MG/ML IV SOLN COMPARISON:  MR brain 04/11/2017.  CTA head neck 04/11/2017. FINDINGS: MRI HEAD FINDINGS Brain: Unchanged subcentimeter foci of restricted diffusion, RIGHT brainstem and inferior cerebellum, consistent with multifocal infarction in the setting of RIGHT vertebral and RIGHT PICA occlusion. No new areas of concern. Vascular: See below. Skull and upper cervical spine: Normal marrow signal. Sinuses/Orbits: Unremarkable. Other: Incidental note is made of medial positioning of the RIGHT vocal cord. This is likely the result of the lateral medullary infarct. MRA NECK FINDINGS No great vessel stenosis.  Normal transverse arch.  Bovine trunk. Normal BILATERAL common carotid arteries. Normal BILATERAL carotid bifurcations. No ICA dissection. No changes of fibromuscular dysplasia. Normal BILATERAL subclavian arteries.  Normal vertebral origins. LEFT vertebral dominant and widely patent through the neck. RIGHT vertebral in the neck is diminutive, related to the V3 occlusion at the C1 level. No definitive evidence pointing to dissection is observed, such as fibromuscular disease, or blood products surrounding the vessel; nevertheless, dissection appears to be the most likely etiology. IMPRESSION: Unchanged subcentimeter foci of restricted diffusion consistent with acute nonhemorrhagic infarction, involving the brainstem and inferior cerebellum, RIGHT PICA territory. 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 Of note, a history of neck pain was elicited by the neurologist. Hypercoagulable state resulting in thrombosis is also a consideration, and appropriate lab panel is pending. Except for the RIGHT vertebral occlusion, unremarkable appearing extracranial circulation. Electronically Signed   By: Elsie Stain M.D.   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  Records reviewed and summated above. Stroke: Continue secondary stroke prophylaxis and Risk Factor Modification listed below:   Antiplatelet therapy:  Transition from heparin ggt to oral when appropriate Blood Pressure Management:  Continue current medication with prn's with permisive HTN per primary team Statin Agent:    1. Does the need for close, 24 hr/day medical supervision in concert with the patient's rehab needs make it unreasonable for this patient to be served in a less intensive setting? Yes  2. Co-Morbidities requiring supervision/potential complications: anxiety (ensure anxiety and resulting apprehension do not limit functional progress; consider prn medications if warranted), ADD (monitor),  Hyperlipidemia (cont meds), hypokalemia (continue to monitor and replete as necessary) 3. Due to safety, disease management and patient education, does the patient require 24 hr/day rehab nursing? Yes 4. Does the patient require coordinated care of a physician, rehab nurse, PT (1-2 hrs/day, 5 days/week) and OT (1-2 hrs/day, 5 days/week) to address physical and functional deficits in the context of the above medical diagnosis(es)? Yes Addressing deficits in the following areas: balance, endurance, locomotion, strength, transferring, bathing, dressing, toileting and psychosocial support 5. Can the patient actively participate in an intensive therapy program of at least 3 hrs of therapy per day at least 5 days per week? Yes 6. The potential for patient to make measurable gains while on inpatient rehab is excellent 7. Anticipated functional outcomes upon discharge from inpatient rehab are modified independent and supervision  with PT, modified independent and supervision with OT, n/a with SLP. 8. Estimated rehab length of stay to reach the  above functional goals is: 7-10 days. 9. Anticipated D/C setting: Home 10. Anticipated post D/C treatments: HH therapy and Home excercise program 11. Overall Rehab/Functional Prognosis: good  RECOMMENDATIONS: This patient's condition is appropriate for continued rehabilitative care in the following setting: CIR after completion of medical workup and medically stable. Patient has agreed to participate in recommended program. Yes Note that insurance prior authorization may be required for reimbursement for recommended care.  Comment: Rehab Admissions Coordinator to follow up.  Maryla Morrow, MD, ABPMR 04/13/17 Mcarthur Rossetti Angiulli, PA-C 04/14/2017

## 2017-04-14 NOTE — CV Procedure (Signed)
    TRANSESOPHAGEAL ECHOCARDIOGRAM   NAME:  Christen BameRobert F Rochette   MRN: [id number] DOB:  03/23/1976   ADMIT DATE: 04/11/2017  INDICATIONS: Stroke    PROCEDURE:   Informed consent was obtained prior to the procedure. The risks, benefits and alternatives for the procedure were discussed and the patient comprehended these risks.  Risks include, but are not limited to, cough, sore throat, vomiting, nausea, somnolence, esophageal and stomach trauma or perforation, bleeding, low blood pressure, aspiration, pneumonia, infection, trauma to the teeth and death.    After a procedural time-out, the patient had his throat anesthetized with topical cetacaine spray and then was started on propofol by the anesthesiology service. Very soon after starting propofol he said his face turned involuntarily to the left and eyes started fluttering. The propofol was stopped immediately and a code stroke was called. The symptoms resolved very quickly and Dr. Pearlean BrownieSethi responded to the bedside who also felt it was a reaction to the medications.   The patient was then sedated with versed and fentanyl without difficulty and the transesophageal probe was inserted in the esophagus and stomach without difficulty and multiple views were obtained.    COMPLICATIONS:    There were no immediate complications.  FINDINGS:  LEFT VENTRICLE: EF = 55%. No regional wall motion abnormalities.  RIGHT VENTRICLE: Normal size and function.   LEFT ATRIUM: Normal  LEFT ATRIAL APPENDAGE: No thrombus.   RIGHT ATRIUM: Normal  AORTIC VALVE:  Trileaflet. No AI/AS  MITRAL VALVE:    Minimal prolapse. Trivial MR  TRICUSPID VALVE:  Mild prolapse. Trivial TR  PULMONIC VALVE: Grossly normal.  INTERATRIAL SEPTUM: Intra-atrial septal aneurysm with small PFO. + R to left shunting with bubble study and valsalva.   PERICARDIUM: No effusion  DESCENDING AORTA: Normal    Milady Fleener,MD 11:31 AM

## 2017-04-14 NOTE — Progress Notes (Signed)
Advanced Heart Failure Rounding Note   Subjective:     Stable overnight. Still with double vision and ataxia. Rhythm stable overnight. No AF.   Objective:   Weight Range:  Vital Signs:   Temp:  [97.6 F (36.4 C)-99.3 F (37.4 C)] 99.3 F (37.4 C) (12/24 0800) Pulse Rate:  [65-99] 65 (12/24 0700) Resp:  [8-22] 13 (12/24 0700) BP: (103-138)/(81-110) 113/86 (12/24 0700) SpO2:  [98 %-100 %] 99 % (12/24 0700) Last BM Date: 04/09/17  Weight change: Filed Weights   04/11/17 1707 04/11/17 2227  Weight: 74.8 kg (165 lb) 74.8 kg (164 lb 14.5 oz)    Intake/Output:   Intake/Output Summary (Last 24 hours) at 04/14/2017 0835 Last data filed at 04/14/2017 0700 Gross per 24 hour  Intake 2409.8 ml  Output 850 ml  Net 1559.8 ml     Physical Exam: General:  Well appearing. No resp difficulty HEENT: normal x for dysconjugate gaze Neck: supple. no JVD. Carotids 2+ bilat; no bruits. No lymphadenopathy or thryomegaly appreciated. Cor: PMI nondisplaced. Regular rate & rhythm. No rubs, gallops or murmurs. Lungs: clear Abdomen: soft, nontender, nondistended. No hepatosplenomegaly. No bruits or masses. Good bowel sounds. Extremities: no cyanosis, clubbing, rash, edema Neuro: alert & orientedx3, Ataxia with FTN.  moves all 4 extremities w/o difficulty. Affect pleasant   Telemetry: NSR 60-70s No arrhythmias Personally reviewed   Labs: Basic Metabolic Panel: Recent Labs  Lab 04/10/17 1430 04/11/17 1910 04/13/17 0320  NA 140 139 137  K 4.4 4.3 3.4*  CL 106 111 108  CO2 26 24 24   GLUCOSE 144* 120* 111*  BUN 9 8 6   CREATININE 0.95 0.92 0.77  CALCIUM 9.4 8.4* 8.5*    Liver Function Tests: Recent Labs  Lab 04/11/17 1910  AST 21  ALT 20  ALKPHOS 49  BILITOT 0.6  PROT 5.7*  ALBUMIN 3.6   No results for input(s): LIPASE, AMYLASE in the last 168 hours. No results for input(s): AMMONIA in the last 168 hours.  CBC: Recent Labs  Lab 04/10/17 1430 04/11/17 1910  04/13/17 0320 04/14/17 0301  WBC 6.1 8.3 6.0 6.5  NEUTROABS 4.4 6.5  --   --   HGB 15.4 14.0 14.2 13.7  HCT 44.0 41.5 42.0 40.1  MCV 90.3 91.8 91.9 91.3  PLT 159 153 149* 165    Cardiac Enzymes: No results for input(s): CKTOTAL, CKMB, CKMBINDEX, TROPONINI in the last 168 hours.  BNP: BNP (last 3 results) No results for input(s): BNP in the last 8760 hours.  ProBNP (last 3 results) No results for input(s): PROBNP in the last 8760 hours.    Other results:  Imaging: Ct Head Wo Contrast  Result Date: 04/13/2017 CLINICAL DATA:  Headache and nystagmus. Recent small cerebellar infarct. EXAM: CT HEAD WITHOUT CONTRAST TECHNIQUE: Contiguous axial images were obtained from the base of the skull through the vertex without intravenous contrast. COMPARISON:  Brain MRI 04/12/2017 FINDINGS: Brain: No mass lesion, intraparenchymal hemorrhage or extra-axial collection. Focal area of hypoattenuation in the right cerebellum corresponds to the infarct seen on MRI. Brain parenchyma and CSF-containing spaces are normal for age. Vascular: No hyperdense vessel or unexpected calcification. Skull: Normal visualized skull base, calvarium and extracranial soft tissues. Sinuses/Orbits: No sinus fluid levels or advanced mucosal thickening. No mastoid effusion. Normal orbits. IMPRESSION: 1. Expected appearance of small right cerebellar infarct, without hemorrhage. No mass effect. 2. Otherwise normal brain. Electronically Signed   By: Deatra RobinsonKevin  Herman M.D.   On: 04/13/2017 03:06  Mr Maxine GlennMra Neck W Wo Contrast  Result Date: 04/12/2017 CLINICAL DATA:  Continued surveillance of stroke. RIGHT vertebral occlusion. EXAM: MRI HEAD WITHOUT CONTRAST MRA NECK WITHOUT AND WITH CONTRAST TECHNIQUE: Multiplanar, multiecho pulse sequences of the brain and surrounding structures were obtained without intravenous contrast. Angiographic images of the neck were obtained using MRA technique with and without intravenous contrast. Carotid  stenosis measurements (when applicable) are obtained utilizing NASCET criteria, using the distal internal carotid diameter as the denominator. CONTRAST:  15mL MULTIHANCE GADOBENATE DIMEGLUMINE 529 MG/ML IV SOLN COMPARISON:  MR brain 04/11/2017.  CTA head neck 04/11/2017. FINDINGS: MRI HEAD FINDINGS Brain: Unchanged subcentimeter foci of restricted diffusion, RIGHT brainstem and inferior cerebellum, consistent with multifocal infarction in the setting of RIGHT vertebral and RIGHT PICA occlusion. No new areas of concern. Vascular: See below. Skull and upper cervical spine: Normal marrow signal. Sinuses/Orbits: Unremarkable. Other: Incidental note is made of medial positioning of the RIGHT vocal cord. This is likely the result of the lateral medullary infarct. MRA NECK FINDINGS No great vessel stenosis.  Normal transverse arch.  Bovine trunk. Normal BILATERAL common carotid arteries. Normal BILATERAL carotid bifurcations. No ICA dissection. No changes of fibromuscular dysplasia. Normal BILATERAL subclavian arteries.  Normal vertebral origins. LEFT vertebral dominant and widely patent through the neck. RIGHT vertebral in the neck is diminutive, related to the V3 occlusion at the C1 level. No definitive evidence pointing to dissection is observed, such as fibromuscular disease, or blood products surrounding the vessel; nevertheless, dissection appears to be the most likely etiology. IMPRESSION: Unchanged subcentimeter foci of restricted diffusion consistent with acute nonhemorrhagic infarction, involving the brainstem and inferior cerebellum, RIGHT PICA territory. 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 Of note, a history of neck pain was elicited by the neurologist. Hypercoagulable state resulting in thrombosis is also a consideration, and appropriate lab panel is pending. Except for the RIGHT  vertebral occlusion, unremarkable appearing extracranial circulation. Electronically Signed   By: Elsie StainJohn T Curnes M.D.   On: 04/12/2017 13:44   Mr Brain Wo Contrast  Result Date: 04/12/2017 CLINICAL DATA:  Continued surveillance of stroke. RIGHT vertebral occlusion. EXAM: MRI HEAD WITHOUT CONTRAST MRA NECK WITHOUT AND WITH CONTRAST TECHNIQUE: Multiplanar, multiecho pulse sequences of the brain and surrounding structures were obtained without intravenous contrast. Angiographic images of the neck were obtained using MRA technique with and without intravenous contrast. Carotid stenosis measurements (when applicable) are obtained utilizing NASCET criteria, using the distal internal carotid diameter as the denominator. CONTRAST:  15mL MULTIHANCE GADOBENATE DIMEGLUMINE 529 MG/ML IV SOLN COMPARISON:  MR brain 04/11/2017.  CTA head neck 04/11/2017. FINDINGS: MRI HEAD FINDINGS Brain: Unchanged subcentimeter foci of restricted diffusion, RIGHT brainstem and inferior cerebellum, consistent with multifocal infarction in the setting of RIGHT vertebral and RIGHT PICA occlusion. No new areas of concern. Vascular: See below. Skull and upper cervical spine: Normal marrow signal. Sinuses/Orbits: Unremarkable. Other: Incidental note is made of medial positioning of the RIGHT vocal cord. This is likely the result of the lateral medullary infarct. MRA NECK FINDINGS No great vessel stenosis.  Normal transverse arch.  Bovine trunk. Normal BILATERAL common carotid arteries. Normal BILATERAL carotid bifurcations. No ICA dissection. No changes of fibromuscular dysplasia. Normal BILATERAL subclavian arteries.  Normal vertebral origins. LEFT vertebral dominant and widely patent through the neck. RIGHT vertebral in the neck is diminutive, related to the V3 occlusion at the C1 level.  No definitive evidence pointing to dissection is observed, such as fibromuscular disease, or blood products surrounding the vessel; nevertheless, dissection  appears to be the most likely etiology. IMPRESSION: Unchanged subcentimeter foci of restricted diffusion consistent with acute nonhemorrhagic infarction, involving the brainstem and inferior cerebellum, RIGHT PICA territory. 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 Of note, a history of neck pain was elicited by the neurologist. Hypercoagulable state resulting in thrombosis is also a consideration, and appropriate lab panel is pending. Except for the RIGHT vertebral occlusion, unremarkable appearing extracranial circulation. Electronically Signed   By: Elsie Stain M.D.   On: 04/12/2017 13:44     Medications:     Scheduled Medications: . atomoxetine  10 mg Oral Daily  . FLUoxetine  20 mg Oral Daily  . gadobenate dimeglumine  15 mL Intravenous Once  . multivitamin with minerals  1 tablet Oral Daily  . rosuvastatin  20 mg Oral q1800    Infusions: . sodium chloride    . sodium chloride 75 mL/hr at 04/14/17 0817  . sodium chloride    . heparin 1,050 Units/hr (04/14/17 0817)    PRN Medications: acetaminophen **OR** acetaminophen (TYLENOL) oral liquid 160 mg/5 mL **OR** acetaminophen, ibuprofen, meclizine, ondansetron (ZOFRAN) IV, ondansetron   Assessment:    41 y/o male with ADD, elevated lipoprotein (a) and FHX of hypercoaguable d/o admitted with R cerebellar stroke   Plan/Discussion:    1. Acute 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  - Stable overnight. Still with diplopia and ataxia. I spoke with Dr. Pearlean Brownie yesterday who feels this might be chronic dissection with acute closure. Other etiologies include PAF, intracardiac shunting or hypercoaguable d/o.  - Hypercoaguable w/u underway. (results still pending) - Echo reviewed personally and no evidence of embolic source. - TEE and ILR scheduled for today. Agree with LE dopplers will be done  today.   - Continue anticoagulation and statin. With risk of embolic phenomenon would favor Eliquis over ASA/Plavix but will defer to Neurology. Await results of TEE.   Length of Stay: 3    Arvilla Meres MD 04/14/2017, 8:35 AM  Advanced Heart Failure Team Pager [phone] (M-F; 7a - 4p)  Please contact [facility] for night-coverage after hours (4p -7a ) and weekends on [email or web address]

## 2017-04-14 NOTE — Transfer of Care (Signed)
Immediate Anesthesia Transfer of Care Note  Patient: Jake Moore  Procedure(s) Performed: TRANSESOPHAGEAL ECHOCARDIOGRAM (TEE) (N/A )  Patient Location: PACU  Anesthesia Type:MAC  Level of Consciousness: awake, alert  and oriented  Airway & Oxygen Therapy: Patient Spontanous Breathing and Patient connected to nasal cannula oxygen  Post-op Assessment: Report given to RN and Post -op Vital signs reviewed and stable  Post vital signs: Reviewed and stable  Last Vitals:  Vitals:   04/14/17 0907 04/14/17 1127  BP: (!) 150/99 (!) 129/97  Pulse: 77   Resp: 15   Temp: 36.6 C 36.6 C  SpO2: 100%     Last Pain:  Vitals:   04/14/17 0907  TempSrc: Oral  PainSc:       Patients Stated Pain Goal: 0 (42/59/56 [id number])  Complications: No apparent anesthesia complications

## 2017-04-14 NOTE — Progress Notes (Signed)
STROKE TEAM PROGRESS NOTE   HISTORY OF PRESENT ILLNESS (per record) Jake Moore is an 41 y.o. male with HLD, family history significant for blood clots, and prior tobacco use presents to the emergency room after being discharged yesterday for worsening dizziness, gait imbalance nausea headache and progressive of right-sided facial numbness. He came with same symptoms yesterday that started at 8:30 in the morning. During that visit his CT head was done and patient was given meclizine. Reviewing of the note is unclear if the patient was made to walk prior to discharge. Patient states that symptoms worsened yesterday at 11 PM and numbness over his face extended from nose to the right half of his face.   He states that he lifted weights the night before but denies recent chiropractic manipulation, twisting of his neck. He does complain of neck pain on the left side. His sister and mother had multiple clots. [facility] known to have higher levels of factor VIII and elevated lipoprotein A levels. He takes crestor at home, not on ASA.  Date last known well: 12.20.18 Time last known well: 8.15 am tPA Given: outside window NIHSS: 1 Baseline MRS 0      SUBJECTIVE (INTERVAL HISTORY) His wife and father are at the bedside.  Patient reports    persistent diplopia but wearing eyeglasses with a small patch helps. Numbness has improved but continues to fluctuate. Patient was seen in TEE today. He developed Transient fluttering of his eyes and became unresponsive and leaning to the left after he was given propofol and his heart rate became tachycardic. This stopped after the propofol was discontinued and patient recovered back to his baseline within minutes. A code stroke was called and evaluated the patient and did not notice any new deficits. Interestingly he stated that his diplopia was improved. TEE was subsequently performed with Versed and  fentanyl instead and was uneventful. Patient was found to have a small PFO but no other cardiac source of embolism. OBJECTIVE Temp:  [97.6 F (36.4 C)-99.3 F (37.4 C)] 98.5 F (36.9 C) (12/24 1200) Pulse Rate:  [65-99] 82 (12/24 1200) Cardiac Rhythm: Normal sinus rhythm (12/24 1240) Resp:  [8-19] 13 (12/24 1200) BP: (113-150)/(83-105) 129/93 (12/24 1240) SpO2:  [98 %-100 %] 98 % (12/24 1200) Weight:  [164 lb (74.4 kg)] 164 lb (74.4 kg) (12/24 0907)  CBC:  Recent Labs  Lab 04/10/17 1430 04/11/17 1910 04/13/17 0320 04/14/17 0301  WBC 6.1 8.3 6.0 6.5  NEUTROABS 4.4 6.5  --   --   HGB 15.4 14.0 14.2 13.7  HCT 44.0 41.5 42.0 40.1  MCV 90.3 91.8 91.9 91.3  PLT 159 153 149* 165    Basic Metabolic Panel:  Recent Labs  Lab 04/11/17 1910 04/13/17 0320  NA 139 137  K 4.3 3.4*  CL 111 108  CO2 24 24  GLUCOSE 120* 111*  BUN 8 6  CREATININE 0.92 0.77  CALCIUM 8.4* 8.5*    Lipid Panel:     Component Value Date/Time   CHOL 141 04/12/2017 0254   TRIG 65 04/12/2017 0254   HDL 71 04/12/2017 0254   CHOLHDL 2.0 04/12/2017 0254   VLDL 13 04/12/2017 0254   LDLCALC 57 04/12/2017 0254   HgbA1c:  Lab Results  Component Value Date   HGBA1C 5.2 04/12/2017   Urine Drug Screen: No results found for: LABOPIA, COCAINSCRNUR, LABBENZ, AMPHETMU, THCU, LABBARB  Alcohol Level No results found for: [facility]  IMAGING  Ct Head Wo Contrast 04/10/2017 IMPRESSION:  Normal head CT.    Ct Angio Neck W Or Wo Contrast 04/11/2017 IMPRESSION:  1. 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 Finding is of uncertain etiology, without overt evidence for dissection or other acute abnormality. Dominant left vertebral artery widely patent to the vertebrobasilar junction.  2. Otherwise normal CTA of the neck.    Mr Angiogram Head W Or Wo Contrast 04/11/2017 IMPRESSION:  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, related to  distal RIGHT vertebral artery and RIGHT PICA occlusion.    Mr Brain 30Wo Contrast Mr Maxine GlennMra Neck W Wo Contrast 04/12/2017 IMPRESSION:  Unchanged subcentimeter foci of restricted diffusion consistent with acute nonhemorrhagic infarction, involving the brainstem and inferior cerebellum, RIGHT PICA territory. 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 Of note, a history of neck pain was elicited by the neurologist. Hypercoagulable state resulting in thrombosis is also a consideration, and appropriate lab panel is pending. Except for the RIGHT vertebral occlusion, unremarkable appearing extracranial circulation.    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     Transthoracic Echocardiogram -Normal LV systolic and diastolic function; trace AI; mild TR   TEE postive right to left shunt. No clot  LE venous doppler negative for DVt  TCD Bubble study positive for small right to left shunt  Protein C and S, antithrombin III, lupus anticoagulant negative  PHYSICAL EXAM Vitals:   04/14/17 1145 04/14/17 1157 04/14/17 1200 04/14/17 1240  BP: 140/90 (!) 130/91 (!) 130/91 (!) 129/93  Pulse: 81 80 82   Resp: 11 15 13    Temp:   98.5 F (36.9 C)   TempSrc:   Oral   SpO2: 99% 98% 98%   Weight:      Height:        PHYSICAL EXAM Physical exam: Exam: Gen: NAD Eyes: anicteric sclerae, moist conjunctivae                    CV: no MRG, no carotid bruits, no peripheral edema Mental Status: Alert, follows commands, good historian  Neuro: Detailed Neurologic Exam  Speech:    No aphasia, no dysarthria, luent speech  Cranial Nerves:    Right horner Syndrome.   , 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  EOMI.  Visual fields full. Right  facial numbness. Face symmetric, Tongue midline. Hearing intact to voice. Shoulder shrug intact  Motor Observation:    no involuntary movements noted. Tone appears normal.     Strength:    5/5     Sensation:  Left arm>leg numbness Coordination : mild right finger to nose ataxia  Plantars downgoing.  Gait unsteady with ataxia   ASSESSMENT/PLAN [name] is a 41 y.o. male with history of anxiety, tobacco use, hyperlipidemia, and family history significant for blood clots presenting after recent discharge 04/10/17 for dizziness, gait imbalance, nausea, headache, and progressive of right-sided facial numbness. He returned with worsening of his deficits. He did not receive IV t-PA due to late presentation.  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  Resultant  partial Wallenburg syndrome  CT head - normal head CT.  MRI head - Acute nonhemorrhagic subcentimeter Rt inferior cerebellar and Rt medullary infarcts.  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  Carotid Doppler - CTA neck  2D Echo - Normal LV systolic and diastolic function; trace AI; mild TRLDL - 57  TEE small PFO  LE venous dopplers negative  HgbA1c - 5.2  VTE prophylaxis - IV heparin No diet orders on file  No antithrombotic prior to admission, now on heparin IV  Patient counseled to be compliant with his antithrombotic medications  Ongoing aggressive stroke risk factor management  Therapy recommendations:  pending  Disposition:  Pending  Hypertension  Stable  Permissive hypertension (OK if < 220/120) but gradually normalize in 5-7 days  Long-term BP goal normotensive  Hyperlipidemia  Home meds:  Crestor 10 mg daily PTA  LDL 57, goal <  70  Crestor increased to 20 mg daily  Continue statin at discharge   Other Stroke Risk Factors  Former cigarette smoker - quit 14 months ago  ETOH use, advised to drink no more than 1 drink per day.  Family history of clotting disorder-Factor 8 excess  Other Active Problems  Anxiety   Plan / Recommendations  Hospital day # 3 Discontinue IV heparin drip  and change to aspirin and Plavix for 3 months followed by aspirin alone. Long discussion the patient, wife and father and Dr. Violeta Gelinas and Bensimhon and answered questions. The patient has a small PFO but in the absence of lower extremity venous clot this may be a innocent bystander. Will follow hypercoagulable panel labs and add factor VIII levels tomorrow after heparin is discontinued today. If etiology of stroke remains cryptogenic may consider elective PFO closure later as an outpatient. If patient is found to be hypercoagulable he may need long-term anticoagulation. This patient is critically ill and at significant risk of neurological worsening, death and care requires constant monitoring of vital signs, hemodynamics,respiratory and cardiac monitoring, extensive review of multiple databases, frequent neurological assessment, discussion with family, other specialists and medical decision making of high complexity.I have made any additions or clarifications directly to the above note.This critical care time does not reflect procedure time, or teaching time or supervisory time of PA/NP/Med Resident etc but could involve care discussion time.  I spent 30 minutes of neurocritical care time  in the care of  this patient.      Delia Heady, MD Stroke Neurology  To contact Stroke Continuity provider, please refer to [email or web address]. After hours, contact General Neurology

## 2017-04-14 NOTE — H&P (View-Only) (Signed)
Advanced Heart Failure Rounding Note   Subjective:     Stable overnight. Still with double vision and ataxia. Rhythm stable overnight. No AF.   Objective:   Weight Range:  Vital Signs:   Temp:  [97.6 F (36.4 C)-99.3 F (37.4 C)] 99.3 F (37.4 C) (12/24 0800) Pulse Rate:  [65-99] 65 (12/24 0700) Resp:  [8-22] 13 (12/24 0700) BP: (103-138)/(81-110) 113/86 (12/24 0700) SpO2:  [98 %-100 %] 99 % (12/24 0700) Last BM Date: 04/09/17  Weight change: Filed Weights   04/11/17 1707 04/11/17 2227  Weight: 74.8 kg (165 lb) 74.8 kg (164 lb 14.5 oz)    Intake/Output:   Intake/Output Summary (Last 24 hours) at 04/14/2017 0835 Last data filed at 04/14/2017 0700 Gross per 24 hour  Intake 2409.8 ml  Output 850 ml  Net 1559.8 ml     Physical Exam: General:  Well appearing. No resp difficulty HEENT: normal x for dysconjugate gaze Neck: supple. no JVD. Carotids 2+ bilat; no bruits. No lymphadenopathy or thryomegaly appreciated. Cor: PMI nondisplaced. Regular rate & rhythm. No rubs, gallops or murmurs. Lungs: clear Abdomen: soft, nontender, nondistended. No hepatosplenomegaly. No bruits or masses. Good bowel sounds. Extremities: no cyanosis, clubbing, rash, edema Neuro: alert & orientedx3, Ataxia with FTN.  moves all 4 extremities w/o difficulty. Affect pleasant   Telemetry: NSR 60-70s No arrhythmias Personally reviewed   Labs: Basic Metabolic Panel: Recent Labs  Lab 04/10/17 1430 04/11/17 1910 04/13/17 0320  NA 140 139 137  K 4.4 4.3 3.4*  CL 106 111 108  CO2 26 24 24   GLUCOSE 144* 120* 111*  BUN 9 8 6   CREATININE 0.95 0.92 0.77  CALCIUM 9.4 8.4* 8.5*    Liver Function Tests: Recent Labs  Lab 04/11/17 1910  AST 21  ALT 20  ALKPHOS 49  BILITOT 0.6  PROT 5.7*  ALBUMIN 3.6   No results for input(s): LIPASE, AMYLASE in the last 168 hours. No results for input(s): AMMONIA in the last 168 hours.  CBC: Recent Labs  Lab 04/10/17 1430 04/11/17 1910  04/13/17 0320 04/14/17 0301  WBC 6.1 8.3 6.0 6.5  NEUTROABS 4.4 6.5  --   --   HGB 15.4 14.0 14.2 13.7  HCT 44.0 41.5 42.0 40.1  MCV 90.3 91.8 91.9 91.3  PLT 159 153 149* 165    Cardiac Enzymes: No results for input(s): CKTOTAL, CKMB, CKMBINDEX, TROPONINI in the last 168 hours.  BNP: BNP (last 3 results) No results for input(s): BNP in the last 8760 hours.  ProBNP (last 3 results) No results for input(s): PROBNP in the last 8760 hours.    Other results:  Imaging: Ct Head Wo Contrast  Result Date: 04/13/2017 CLINICAL DATA:  Headache and nystagmus. Recent small cerebellar infarct. EXAM: CT HEAD WITHOUT CONTRAST TECHNIQUE: Contiguous axial images were obtained from the base of the skull through the vertex without intravenous contrast. COMPARISON:  Brain MRI 04/12/2017 FINDINGS: Brain: No mass lesion, intraparenchymal hemorrhage or extra-axial collection. Focal area of hypoattenuation in the right cerebellum corresponds to the infarct seen on MRI. Brain parenchyma and CSF-containing spaces are normal for age. Vascular: No hyperdense vessel or unexpected calcification. Skull: Normal visualized skull base, calvarium and extracranial soft tissues. Sinuses/Orbits: No sinus fluid levels or advanced mucosal thickening. No mastoid effusion. Normal orbits. IMPRESSION: 1. Expected appearance of small right cerebellar infarct, without hemorrhage. No mass effect. 2. Otherwise normal brain. Electronically Signed   By: Deatra RobinsonKevin  Herman M.D.   On: 04/13/2017 03:06  Mr Maxine GlennMra Neck W Wo Contrast  Result Date: 04/12/2017 CLINICAL DATA:  Continued surveillance of stroke. RIGHT vertebral occlusion. EXAM: MRI HEAD WITHOUT CONTRAST MRA NECK WITHOUT AND WITH CONTRAST TECHNIQUE: Multiplanar, multiecho pulse sequences of the brain and surrounding structures were obtained without intravenous contrast. Angiographic images of the neck were obtained using MRA technique with and without intravenous contrast. Carotid  stenosis measurements (when applicable) are obtained utilizing NASCET criteria, using the distal internal carotid diameter as the denominator. CONTRAST:  15mL MULTIHANCE GADOBENATE DIMEGLUMINE 529 MG/ML IV SOLN COMPARISON:  MR brain 04/11/2017.  CTA head neck 04/11/2017. FINDINGS: MRI HEAD FINDINGS Brain: Unchanged subcentimeter foci of restricted diffusion, RIGHT brainstem and inferior cerebellum, consistent with multifocal infarction in the setting of RIGHT vertebral and RIGHT PICA occlusion. No new areas of concern. Vascular: See below. Skull and upper cervical spine: Normal marrow signal. Sinuses/Orbits: Unremarkable. Other: Incidental note is made of medial positioning of the RIGHT vocal cord. This is likely the result of the lateral medullary infarct. MRA NECK FINDINGS No great vessel stenosis.  Normal transverse arch.  Bovine trunk. Normal BILATERAL common carotid arteries. Normal BILATERAL carotid bifurcations. No ICA dissection. No changes of fibromuscular dysplasia. Normal BILATERAL subclavian arteries.  Normal vertebral origins. LEFT vertebral dominant and widely patent through the neck. RIGHT vertebral in the neck is diminutive, related to the V3 occlusion at the C1 level. No definitive evidence pointing to dissection is observed, such as fibromuscular disease, or blood products surrounding the vessel; nevertheless, dissection appears to be the most likely etiology. IMPRESSION: Unchanged subcentimeter foci of restricted diffusion consistent with acute nonhemorrhagic infarction, involving the brainstem and inferior cerebellum, RIGHT PICA territory. 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 Of note, a history of neck pain was elicited by the neurologist. Hypercoagulable state resulting in thrombosis is also a consideration, and appropriate lab panel is pending. Except for the RIGHT  vertebral occlusion, unremarkable appearing extracranial circulation. Electronically Signed   By: Elsie StainJohn T Curnes M.D.   On: 04/12/2017 13:44   Mr Brain Wo Contrast  Result Date: 04/12/2017 CLINICAL DATA:  Continued surveillance of stroke. RIGHT vertebral occlusion. EXAM: MRI HEAD WITHOUT CONTRAST MRA NECK WITHOUT AND WITH CONTRAST TECHNIQUE: Multiplanar, multiecho pulse sequences of the brain and surrounding structures were obtained without intravenous contrast. Angiographic images of the neck were obtained using MRA technique with and without intravenous contrast. Carotid stenosis measurements (when applicable) are obtained utilizing NASCET criteria, using the distal internal carotid diameter as the denominator. CONTRAST:  15mL MULTIHANCE GADOBENATE DIMEGLUMINE 529 MG/ML IV SOLN COMPARISON:  MR brain 04/11/2017.  CTA head neck 04/11/2017. FINDINGS: MRI HEAD FINDINGS Brain: Unchanged subcentimeter foci of restricted diffusion, RIGHT brainstem and inferior cerebellum, consistent with multifocal infarction in the setting of RIGHT vertebral and RIGHT PICA occlusion. No new areas of concern. Vascular: See below. Skull and upper cervical spine: Normal marrow signal. Sinuses/Orbits: Unremarkable. Other: Incidental note is made of medial positioning of the RIGHT vocal cord. This is likely the result of the lateral medullary infarct. MRA NECK FINDINGS No great vessel stenosis.  Normal transverse arch.  Bovine trunk. Normal BILATERAL common carotid arteries. Normal BILATERAL carotid bifurcations. No ICA dissection. No changes of fibromuscular dysplasia. Normal BILATERAL subclavian arteries.  Normal vertebral origins. LEFT vertebral dominant and widely patent through the neck. RIGHT vertebral in the neck is diminutive, related to the V3 occlusion at the C1 level.  No definitive evidence pointing to dissection is observed, such as fibromuscular disease, or blood products surrounding the vessel; nevertheless, dissection  appears to be the most likely etiology. IMPRESSION: Unchanged subcentimeter foci of restricted diffusion consistent with acute nonhemorrhagic infarction, involving the brainstem and inferior cerebellum, RIGHT PICA territory. 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 Of note, a history of neck pain was elicited by the neurologist. Hypercoagulable state resulting in thrombosis is also a consideration, and appropriate lab panel is pending. Except for the RIGHT vertebral occlusion, unremarkable appearing extracranial circulation. Electronically Signed   By: Elsie Stain M.D.   On: 04/12/2017 13:44     Medications:     Scheduled Medications: . atomoxetine  10 mg Oral Daily  . FLUoxetine  20 mg Oral Daily  . gadobenate dimeglumine  15 mL Intravenous Once  . multivitamin with minerals  1 tablet Oral Daily  . rosuvastatin  20 mg Oral q1800    Infusions: . sodium chloride    . sodium chloride 75 mL/hr at 04/14/17 0817  . sodium chloride    . heparin 1,050 Units/hr (04/14/17 0817)    PRN Medications: acetaminophen **OR** acetaminophen (TYLENOL) oral liquid 160 mg/5 mL **OR** acetaminophen, ibuprofen, meclizine, ondansetron (ZOFRAN) IV, ondansetron   Assessment:    41 y/o male with ADD, elevated lipoprotein (a) and FHX of hypercoaguable d/o admitted with R cerebellar stroke   Plan/Discussion:    1. Acute 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  - Stable overnight. Still with diplopia and ataxia. I spoke with Dr. Pearlean Brownie yesterday who feels this might be chronic dissection with acute closure. Other etiologies include PAF, intracardiac shunting or hypercoaguable d/o.  - Hypercoaguable w/u underway. (results still pending) - Echo reviewed personally and no evidence of embolic source. - TEE and ILR scheduled for today. Agree with LE dopplers will be done  today.   - Continue anticoagulation and statin. With risk of embolic phenomenon would favor Eliquis over ASA/Plavix but will defer to Neurology. Await results of TEE.   Length of Stay: 3    Arvilla Meres MD 04/14/2017, 8:35 AM  Advanced Heart Failure Team Pager [phone] (M-F; 7a - 4p)  Please contact [facility] for night-coverage after hours (4p -7a ) and weekends on [email or web address]

## 2017-04-14 NOTE — Anesthesia Procedure Notes (Signed)
Procedure Name: MAC Date/Time: 04/14/2017 11:17 AM Performed by: Teressa Lower., CRNA Pre-anesthesia Checklist: Patient identified, Emergency Drugs available, Suction available, Patient being monitored and Timeout performed Oxygen Delivery Method: Nasal cannula

## 2017-04-14 NOTE — Care Management Note (Signed)
Case Management Note  Patient Details  Name: Jake BameRobert F Moore MRN: [id number] Date of Birth: 12/27/1975  Subjective/Objective:  Pt admitted on 04/11/17 s/p acute nonhemorrhagic RT inferior cerebellar and RT medullary infarcts.  PTA, pt independent, lives with spouse.                  Action/Plan: Will follow for discharge planning as pt progresses.  PT/OT recommending CIR consult; family available 24h/day to assist.    Expected Discharge Date:                  Expected Discharge Plan:  IP Rehab Facility  In-House Referral:     Discharge planning Services  CM Consult  Post Acute Care Choice:    Choice offered to:     DME Arranged:    DME Agency:     HH Arranged:    HH Agency:     Status of Service:  In process, will continue to follow  If discussed at Long Length of Stay Meetings, dates discussed:    Additional Comments:  Glennon Macmerson, Shyler Hamill M, RN 04/14/2017, 2:49 PM

## 2017-04-14 NOTE — Progress Notes (Signed)
ANTICOAGULATION CONSULT NOTE  Pharmacy Consult for heparin Indication: CVA  Patient Measurements: Height: 5\' 10"  (177.8 cm) Weight: 164 lb 14.5 oz (74.8 kg) IBW/kg (Calculated) : 73 Heparin Dosing Weight: 74.8kg  Labs: Recent Labs    04/11/17 1910  04/13/17 0320 04/13/17 1208 04/13/17 2233 04/14/17 0301  HGB 14.0  --  14.2  --   --  13.7  HCT 41.5  --  42.0  --   --  40.1  PLT 153  --  149*  --   --  165  HEPARINUNFRC  --    < > 0.25* 0.62 0.40 0.40  CREATININE 0.92  --  0.77  --   --   --    < > = values in this interval not displayed.   Estimated Creatinine Clearance: 126.7 mL/min (by C-G formula based on SCr of 0.77 mg/dL).  Assessment:  41 y.o. 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 Last heparin level is therapeutic at 0.4. H/H WNL, plts 165, no bleeding noted.  Goal of Therapy:  Heparin level 0.3-0.5 Monitor platelets by anticoagulation protocol: Yes   Plan:  Continue heparin gtt at 1,050 units/hr Monitor daily heparin level, CBC, s/s of bleed F/u hypercoag work-up  Enzo BiNathan Mearl Olver, PharmD, BCPS Clinical Pharmacist Pager [phone] 04/14/2017 7:17 AM

## 2017-04-14 NOTE — Anesthesia Preprocedure Evaluation (Signed)
Anesthesia Evaluation  Patient identified by MRN, date of birth, ID band Patient awake    Reviewed: Allergy & Precautions, NPO status , Patient's Chart, lab work & pertinent test results  Airway Mallampati: II  TM Distance: >3 FB Neck ROM: Full    Dental  (+) Teeth Intact, Dental Advisory Given   Pulmonary former smoker,    Pulmonary exam normal breath sounds clear to auscultation       Cardiovascular negative cardio ROS Normal cardiovascular exam Rhythm:Regular Rate:Normal     Neuro/Psych PSYCHIATRIC DISORDERS Anxiety CVA, Residual Symptoms    GI/Hepatic negative GI ROS, Neg liver ROS,   Endo/Other  negative endocrine ROS  Renal/GU negative Renal ROS     Musculoskeletal negative musculoskeletal ROS (+)   Abdominal   Peds  Hematology negative hematology ROS (+)   Anesthesia Other Findings Day of surgery medications reviewed with the patient.  Reproductive/Obstetrics                             Anesthesia Physical Anesthesia Plan  ASA: III  Anesthesia Plan: MAC   Post-op Pain Management:    Induction: Intravenous  PONV Risk Score and Plan: 1 and Propofol infusion and Treatment may vary due to age or medical condition  Airway Management Planned: Nasal Cannula  Additional Equipment:   Intra-op Plan:   Post-operative Plan:   Informed Consent: I have reviewed the patients History and Physical, chart, labs and discussed the procedure including the risks, benefits and alternatives for the proposed anesthesia with the patient or authorized representative who has indicated his/her understanding and acceptance.   Dental advisory given  Plan Discussed with: CRNA and Anesthesiologist  Anesthesia Plan Comments: (Discussed risks/benefits/alternatives to MAC sedation including need for ventilatory support, hypotension, need for conversion to general anesthesia.  All patient questions  answered.  Patient/guardian wishes to proceed.)        Anesthesia Quick Evaluation

## 2017-04-14 NOTE — Progress Notes (Signed)
ANTICOAGULATION CONSULT NOTE  Pharmacy Consult for heparin Indication: CVA  Patient Measurements: Height: 5\' 10"  (177.8 cm) Weight: 164 lb 14.5 oz (74.8 kg) IBW/kg (Calculated) : 73 Heparin Dosing Weight: 74.8kg  Labs: Recent Labs    04/11/17 1910  04/13/17 0320 04/13/17 1208 04/13/17 2233  HGB 14.0  --  14.2  --   --   HCT 41.5  --  42.0  --   --   PLT 153  --  149*  --   --   HEPARINUNFRC  --    < > 0.25* 0.62 0.40  CREATININE 0.92  --  0.77  --   --    < > = values in this interval not displayed.   Estimated Creatinine Clearance: 126.7 mL/min (by C-G formula based on SCr of 0.77 mg/dL).  Assessment:  41 y.o. male with CVA for heparin  Goal of Therapy:  Heparin level 0.3-0.5 Monitor platelets by anticoagulation protocol: Yes   Plan:  Continue Heparin at current rate   Geannie RisenGreg Nancie Bocanegra, PharmD, BCPS

## 2017-04-14 NOTE — Progress Notes (Signed)
Attempted report x1 will call back in 10-15 mins

## 2017-04-14 NOTE — Progress Notes (Signed)
SLP Cancellation Note  Patient Details Name: Jake Moore MRN: [id number] DOB: 08/10/1975   Cancelled treatment:       Reason Eval/Treat Not Completed: Patient at procedure or test/unavailable   Carl Bleecker, Riley NearingBonnie Caroline 04/14/2017, 10:10 AM

## 2017-04-14 NOTE — Interval H&P Note (Signed)
History and Physical Interval Note:  04/14/2017 9:03 AM  Jake Moore  has presented today for surgery, with the diagnosis of stroke  The various methods of treatment have been discussed with the patient and family. After consideration of risks, benefits and other options for treatment, the patient has consented to  Procedure(s): TRANSESOPHAGEAL ECHOCARDIOGRAM (TEE) (N/A) as a surgical intervention .  The patient's history has been reviewed, patient examined, no change in status, stable for surgery.  I have reviewed the patient's chart and labs.  Questions were answered to the patient's satisfaction.     Daniel Bensimhon

## 2017-04-14 NOTE — Consult Note (Signed)
ELECTROPHYSIOLOGY CONSULT NOTE  Patient ID: Jake Moore MRN: [id number], DOB/AGE: 40/06/1975   Admit date: 04/11/2017 Date of Consult: 04/14/2017  Primary Physician: Martha ClanShaw, William, MD Primary Cardiologist: Shirlee LatchMcLean Reason for Consultation: Cryptogenic stroke; recommendations regarding Implantable Loop Recorder  History of Present Illness EP has been asked to evaluate Jake Moore for placement of an implantable loop recorder to monitor for atrial fibrillation by Dr Pearlean BrownieSethi.  The patient was admitted on 04/11/2017 with right sided facial numbness, weakness, and dizziness.  Imaging demonstrated acute right inferior cerebellar infarct and right medullary infarcts felt to be embolic 2/2 unknown source. He was seen by [facility] who did not feel that this was caused by dissection.  He has undergone workup for stroke including echocardiogram and carotid imaging.  The patient has been monitored on telemetry which has demonstrated sinus rhythm with no arrhythmias.  Inpatient stroke work-up is to be completed with a TEE.   Echocardiogram this admission demonstrated EF 55-60%, trivial AI, LA 30.  Lab work is reviewed.  Prior to admission, the patient denies chest pain, shortness of breath, dizziness, palpitations, or syncope.  They are recovering from their stroke with plans to return home at discharge.   Past Medical History:  Diagnosis Date  . Anxiety   . CVA (cerebral vascular accident) [facility](HCC)      Surgical History: History reviewed. No pertinent surgical history.   Medications Prior to Admission  Medication Sig Dispense Refill Last Dose  . atomoxetine (STRATTERA) 10 MG capsule Take 10 mg by mouth daily.   04/11/2017 at Unknown time  . FLUoxetine (PROZAC) 20 MG tablet Take 20 mg by mouth daily.   04/11/2017 at Unknown time  . ibuprofen (ADVIL,MOTRIN) 200 MG tablet Take 200 mg by mouth every 6 (six) hours as needed for moderate pain.   prn  . meclizine (ANTIVERT) 25 MG tablet Take 1 tablet  (25 mg total) by mouth 3 (three) times daily as needed for dizziness. 15 tablet 0 prn  . Multiple Vitamin (MULTIVITAMIN) tablet Take 1 tablet by mouth daily.   04/11/2017 at Unknown time  . ondansetron (ZOFRAN) 8 MG tablet Take 1 tablet (8 mg total) by mouth every 8 (eight) hours as needed for nausea or vomiting. 10 tablet 0 prn  . rosuvastatin (CRESTOR) 10 MG tablet Take 10 mg by mouth daily.   04/11/2017 at Unknown time  . naproxen (NAPROSYN) 500 MG tablet Take 1 tablet (500 mg total) by mouth 2 (two) times daily. (Patient not taking: Reported on 07/14/2016) 30 tablet 0 Not Taking at Unknown time    Inpatient Medications:  . atomoxetine  10 mg Oral Daily  . FLUoxetine  20 mg Oral Daily  . gadobenate dimeglumine  15 mL Intravenous Once  . multivitamin with minerals  1 tablet Oral Daily  . rosuvastatin  20 mg Oral q1800    Allergies:  Allergies  Allergen Reactions  . Penicillins Hives    Has patient had a PCN reaction causing immediate rash, facial/tongue/throat swelling, SOB or lightheadedness with hypotension: YES Has patient had a PCN reaction causing severe rash involving mucus membranes or skin necrosis: NO Has patient had a PCN reaction that required hospitalizationNO Has patient had a PCN reaction occurring within the last 10 years: NO If all of the above answers are "NO", then may proceed with Cephalosporin use.    Social History   Socioeconomic History  . Marital status: Single    Spouse name: Not on file  . Number of  children: Not on file  . Years of education: Not on file  . Highest education level: Not on file  Social Needs  . Financial resource strain: Not on file  . Food insecurity - worry: Not on file  . Food insecurity - inability: Not on file  . Transportation needs - medical: Not on file  . Transportation needs - non-medical: Not on file  Occupational History  . Not on file  Tobacco Use  . Smoking status: Former Smoker    Years: 20.00    Types: Cigarettes     Last attempt to quit: 01/15/2016    Years since quitting: 1.2  . Smokeless tobacco: Former Neurosurgeon    Types: Chew  Substance and Sexual Activity  . Alcohol use: Yes    Alcohol/week: 12.0 oz    Types: 20 Cans of beer per week    Comment: occasonal  . Drug use: No  . Sexual activity: Not on file  Other Topics Concern  . Not on file  Social History Narrative  . Not on file     Family History  Problem Relation Age of Onset  . Other Sister        hypercoagulable state with SCAD  . CAD Maternal Grandfather       Review of Systems: All other systems reviewed and are otherwise negative except as noted above.  Physical Exam: Vitals:   04/14/17 0300 04/14/17 0400 04/14/17 0500 04/14/17 0600  BP: 117/83 116/87 125/86 116/89  Pulse: 71 74 74 70  Resp: 10 10 (!) 8 13  Temp:  97.7 F (36.5 C)    TempSrc:  Oral    SpO2: 98% 98% 98% 99%  Weight:      Height:        GEN- The patient is well appearing, alert and oriented x 3 today.   Head- normocephalic, atraumatic Eyes-  Sclera clear, conjunctiva pink Ears- hearing intact Oropharynx- clear Neck- supple Lungs- Clear to ausculation bilaterally, normal work of breathing Heart- Regular rate and rhythm, no murmurs, rubs or gallops  GI- soft, NT, ND, + BS Extremities- no clubbing, cyanosis, or edema MS- no significant deformity or atrophy Skin- no rash or lesion Psych- euthymic mood, full affect   Labs:   Lab Results  Component Value Date   WBC 6.5 04/14/2017   HGB 13.7 04/14/2017   HCT 40.1 04/14/2017   MCV 91.3 04/14/2017   PLT 165 04/14/2017    Recent Labs  Lab 04/11/17 1910 04/13/17 0320  NA 139 137  K 4.3 3.4*  CL 111 108  CO2 24 24  BUN 8 6  CREATININE 0.92 0.77  CALCIUM 8.4* 8.5*  PROT 5.7*  --   BILITOT 0.6  --   ALKPHOS 49  --   ALT 20  --   AST 21  --   GLUCOSE 120* 111*     Radiology/Studies: Ct Angio Neck W Or Wo Contrast Result Date: 04/11/2017 CLINICAL DATA:  Initial evaluation for acute  stroke. EXAM: CT ANGIOGRAPHY NECK TECHNIQUE: Multidetector CT imaging of the neck was performed using the standard protocol during bolus administration of intravenous contrast. Multiplanar CT image reconstructions and MIPs were obtained to evaluate the vascular anatomy. Carotid stenosis measurements (when applicable) are obtained utilizing NASCET criteria, using the distal internal carotid diameter as the denominator. CONTRAST:  <See Chart> ISOVUE-370 IOPAMIDOL (ISOVUE-370) INJECTION 76% COMPARISON:  Comparison made with prior MRI/ MRA from earlier the same day. FINDINGS: Aortic arch: Visualized aortic arch of normal  caliber with normal branch pattern. No flow-limiting stenosis about the origin of the great vessels. Visualized subclavian arteries widely patent without stenosis. Right carotid system: The right common carotid artery widely patent from its origin to the bifurcation without stenosis. No significant atheromatous plaque or narrowing about the right bifurcation. Right ICA widely patent from the bifurcation to the circle of Willis without stenosis, dissection, or occlusion. Left carotid system: Left common carotid artery patent from its origin to the bifurcation without stenosis. No significant atheromatous plaque or narrowing about the left bifurcation. Left ICA patent from the bifurcation to the circle of Willis without stenosis, dissection, or occlusion. Vertebral arteries: Both of the vertebral arteries arise from the subclavian arteries. Left vertebral artery is dominant and widely patent to the vertebrobasilar junction without stenosis, dissection, or occlusion. Diminutive right vertebral artery demonstrates attenuated flow within the distal right V2 segment, and a essentially occludes at the V3 segment and is largely occluded at the level of the skullbase/cranial vault. No overt findings to suggest dissection or other abnormality. Minimal scant thready flow present within the right V4 segment, likely  retrograde in nature from the basilar artery. Probable scant flow within the proximal right PICA, which is also essentially occluded. Skeleton: No acute osseus abnormality. No worrisome lytic or blastic osseous lesions. Small central disc protrusion with calcification present at C6-7 without stenosis. Other neck: No acute soft tissue abnormality within the neck. No adenopathy. Salivary glands normal. Thyroid normal. Upper chest: Visualized upper chest within normal limits. Visualized lungs are clear. IMPRESSION: 1. 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 Finding is of uncertain etiology, without overt evidence for dissection or other acute abnormality. Dominant left vertebral artery widely patent to the vertebrobasilar junction. 2. Otherwise normal CTA of the neck. Electronically Signed   By: Rise Mu M.D.   On: 04/11/2017 20:35   Mr Angiogram Head W Or Wo Contrast Result Date: 04/11/2017 CLINICAL DATA:  Ataxia, stroke suspected, dizziness, RIGHT-sided facial numbness for 1 day. EXAM: MRI HEAD WITHOUT CONTRAST MRA HEAD WITHOUT CONTRAST TECHNIQUE: Multiplanar, multiecho pulse sequences of the brain and surrounding structures were obtained without intravenous contrast. Angiographic images of the head were obtained using MRA technique without contrast. COMPARISON:  Normal CT head performed 04/10/2017. FINDINGS: MRI HEAD FINDINGS Brain: Multiple areas of restricted diffusion, corresponding low ADC, subcentimeter in size, affect the RIGHT inferior cerebellum and dorsolateral medulla consistent with an acute RIGHT PICA territory infarct. No visible hemorrhage, mass lesion, hydrocephalus, or extra-axial fluid. Normal for age cerebral volume. No significant white matter disease. Vascular: Absent flow related enhancement in the non dominant RIGHT vertebral artery. Other flow voids are preserved. Skull and upper cervical spine: Normal marrow signal. Sinuses/Orbits: Unremarkable.  Other: None. MRA HEAD FINDINGS The internal carotid arteries are widely patent throughout their cervical, petrous, cavernous, supraclinoid, and terminal segments. Normal-appearing anterior and middle cerebral artery segments bilaterally. Basilar artery is dolichoectatic but widely patent, with the LEFT vertebral is the sole contributor. There is segmental, and thread-like flow related enhancement, essentially thrombosis, of the visualized V4 segment of the RIGHT vertebral artery. The distal V4 RIGHT vertebral artery is visualized, likely retrograde from the basilar. In addition, there is diminished to absent flow related enhancement in the RIGHT PICA. The other cerebellar branches are preserved.  No saccular aneurysm. 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 Findings discussed with ordering provider. Electronically Signed  By: Elsie StainJohn T Curnes M.D.   On: 04/11/2017 19:39   12-lead ECG sinus rhythm (personally reviewed) All prior EKG's in EPIC reviewed with no documented atrial fibrillation  Telemetry sinus rhythm (personally reviewed)  Assessment and Plan:  1. Cryptogenic stroke The patient presents with cryptogenic stroke.  The patient has a TEE planned for this AM.  I spoke at length with the patient about monitoring for afib with an implantable loop recorder.  Risks, benefits, and alteratives to implantable loop recorder were discussed with the patient today.   At this time, the patient is very clear in their decision to proceed with implantable loop recorder.   Wound care was reviewed with the patient (keep incision clean and dry for 3 days).  Wound check scheduled and entered in AVS.  Please call with questions.   Gypsy BalsamAmber Seiler, NP 04/14/2017 7:34 AM  EP Attending  Patient seen and examined. Agree with above. He has had a cryptogenic stroke. If no reason for his  stroke on TEE then we will proceed with ILR insertion.   Leonia ReevesGregg Taylor,M.D.

## 2017-04-14 NOTE — Progress Notes (Signed)
Physical Therapy Treatment Patient Details Name: Christen BameRobert F Neas MRN: [id number] DOB: 09/02/1975 Today's Date: 04/14/2017    History of Present Illness Christen BameRobert F Bielefeld is a 41 y.o. male with medical history significant of anxiety , hld comes in with over a day of dizziness and worsening right sided facial numbness. 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    PT Comments    Pt does have signs consistent with central vestibular dysfunction including spontaneous nystagmus that can be quieted with glasses and visual targeting.  Compensation and habituation techniques initiated including visual targeting during gait, segmental turning, and x1 seated gaze stability exercises (both vertical and horizontal).  Pt remains appropriate for CIR level therapies at discharge as he is a high fall risk and needs intensive therapy to help him return to independence.    Follow Up Recommendations  CIR     Equipment Recommendations  Rolling walker with 5" wheels;3in1 (PT)    Recommendations for Other Services       Precautions / Restrictions Precautions Precautions: Fall Precaution Comments: right side lean/list    Mobility  Bed Mobility Overal bed mobility: Modified Independent                Transfers Overall transfer level: Needs assistance Equipment used: 1 person hand held assist Transfers: Sit to/from Stand Sit to Stand: Min assist         General transfer comment: Min assist for balance during transitions.    Ambulation/Gait Ambulation/Gait assistance: +2 safety/equipment;Mod assist(wife assisted with lines) Ambulation Distance (Feet): 200 Feet Assistive device: 1 person hand held assist Gait Pattern/deviations: Step-through pattern;Staggering right;Wide base of support Gait velocity: decreased Gait velocity interpretation: Below normal speed for  age/gender General Gait Details: Pt with staggering gait pattern preferencing the right side/listing to the right.  Wide BOS to help with balance.  Practiced visual targeting and segmental turning to help with gaze stability. He reports the glasses are helping.     Modified Rankin (Stroke Patients Only) Modified Rankin (Stroke Patients Only) Pre-Morbid Rankin Score: No symptoms Modified Rankin: Moderately severe disability     Balance Overall balance assessment: Needs assistance Sitting-balance support: Feet supported;No upper extremity supported Sitting balance-Leahy Scale: Good     Standing balance support: Single extremity supported Standing balance-Leahy Scale: Poor Standing balance comment: needs heavy min assist for dynamic movement.                             Cognition Arousal/Alertness: Awake/alert Behavior During Therapy: WFL for tasks assessed/performed Overall Cognitive Status: Within Functional Limits for tasks assessed                                        Exercises Other Exercises Other Exercises: x1 seated vertical and horizontal exercises.          Pertinent Vitals/Pain Pain Assessment: Faces Faces Pain Scale: Hurts little more Pain Location: headache Pain Descriptors / Indicators: Aching Pain Intervention(s): Limited activity within patient's tolerance;Monitored during session;Repositioned           PT Goals (current goals can now be found in the care plan section) Acute Rehab PT Goals Patient Stated Goal: Back to normal Progress towards PT goals: Progressing toward goals    Frequency    Min 4X/week  PT Plan Current plan remains appropriate       AM-PAC PT "6 Clicks" Daily Activity  Outcome Measure  Difficulty turning over in bed (including adjusting bedclothes, sheets and blankets)?: None Difficulty moving from lying on back to sitting on the side of the bed? : None Difficulty sitting down on and  standing up from a chair with arms (e.g., wheelchair, bedside commode, etc,.)?: Unable Help needed moving to and from a bed to chair (including a wheelchair)?: A Little Help needed walking in hospital room?: A Little Help needed climbing 3-5 steps with a railing? : A Lot 6 Click Score: 17    End of Session   Activity Tolerance: Patient tolerated treatment well Patient left: in bed;with call bell/phone within reach;with family/visitor present   PT Visit Diagnosis: Unsteadiness on feet (R26.81);Other abnormalities of gait and mobility (R26.89);Ataxic gait (R26.0);Other symptoms and signs involving the nervous system (R29.898);Dizziness and giddiness (R42)     Time: [id number] PT Time Calculation (min) (ACUTE ONLY): 30 min  Charges:  $Gait Training: 8-22 mins $Therapeutic Exercise: 8-22 mins     Vernice Bowker B. Glenn Christo, PT, DPT [phone]          04/14/2017, 4:55 PM

## 2017-04-14 NOTE — Anesthesia Postprocedure Evaluation (Signed)
Anesthesia Post Note  Patient: Jake Moore  Procedure(s) Performed: TRANSESOPHAGEAL ECHOCARDIOGRAM (TEE) (N/A )     Patient location during evaluation: Endoscopy Anesthesia Type: MAC Level of consciousness: awake and alert Pain management: pain level controlled Vital Signs Assessment: post-procedure vital signs reviewed and stable Respiratory status: spontaneous breathing, nonlabored ventilation, respiratory function stable and patient connected to nasal cannula oxygen Cardiovascular status: stable and blood pressure returned to baseline Postop Assessment: no apparent nausea or vomiting Anesthetic complications: no Comments: No antiemetics given due to MAC procedure, and no patient complaint of nausea/vomiting.     Last Vitals:  Vitals:   04/14/17 1200 04/14/17 1240  BP: (!) 130/91 (!) 129/93  Pulse: 82   Resp: 13   Temp: 36.9 C   SpO2: 98%     Last Pain:  Vitals:   04/14/17 1200  TempSrc: Oral  PainSc:                  Catalina Gravel

## 2017-04-15 ENCOUNTER — Encounter (HOSPITAL_COMMUNITY): Payer: Self-pay | Admitting: Internal Medicine

## 2017-04-15 DIAGNOSIS — Q211 Atrial septal defect: Secondary | ICD-10-CM

## 2017-04-15 LAB — BASIC METABOLIC PANEL
Anion gap: 10 (ref 5–15)
BUN: 8 mg/dL (ref 6–20)
CALCIUM: 9.1 mg/dL (ref 8.9–10.3)
CO2: 21 mmol/L — AB (ref 22–32)
CREATININE: 0.82 mg/dL (ref 0.61–1.24)
Chloride: 106 mmol/L (ref 101–111)
GFR calc non Af Amer: >60 mL/min (ref >60)
Glucose, Bld: 93 mg/dL (ref 65–99)
Potassium: 4 mmol/L (ref 3.5–5.1)
SODIUM: 137 mmol/L (ref 135–145)

## 2017-04-15 LAB — CBC
HCT: 41.9 % (ref 39.0–52.0)
Hemoglobin: 14.3 g/dL (ref 13.0–17.0)
MCH: 31.1 pg (ref 26.0–34.0)
MCHC: 34.1 g/dL (ref 30.0–36.0)
MCV: 91.1 fL (ref 78.0–100.0)
PLATELETS: 167 10*3/uL (ref 150–400)
RBC: 4.6 MIL/uL (ref 4.22–5.81)
RDW: 12.3 % (ref 11.5–15.5)
WBC: 5.3 10*3/uL (ref 4.0–10.5)

## 2017-04-15 MED ORDER — POLYETHYLENE GLYCOL 3350 17 G PO PACK
34.0000 g | PACK | Freq: Once | ORAL | Status: AC
  Administered 2017-04-15: 34 g via ORAL
  Filled 2017-04-15: qty 2

## 2017-04-15 MED ORDER — SENNA 8.6 MG PO TABS
2.0000 | ORAL_TABLET | ORAL | Status: AC
  Administered 2017-04-15: 17.2 mg via ORAL
  Filled 2017-04-15: qty 2

## 2017-04-15 MED ORDER — SENNA 8.6 MG PO TABS
1.0000 | ORAL_TABLET | Freq: Every day | ORAL | Status: DC
  Filled 2017-04-15: qty 1

## 2017-04-15 NOTE — Progress Notes (Addendum)
STROKE TEAM PROGRESS NOTE   HISTORY OF PRESENT ILLNESS (per record) Jake Moore is an 41 y.o. male with HLD, family history significant for blood clots, and prior tobacco use presents to the emergency room after being discharged yesterday for worsening dizziness, gait imbalance nausea headache and progressive of right-sided facial numbness. He came with same symptoms yesterday that started at 8:30 in the morning. During that visit his CT head was done and patient was given meclizine. Reviewing of the note is unclear if the patient was made to walk prior to discharge. Patient states that symptoms worsened yesterday at 11 PM and numbness over his face extended from nose to the right half of his face.   He states that he lifted weights the night before but denies recent chiropractic manipulation, twisting of his neck. He does complain of neck pain on the left side. His sister and mother had multiple clots. His sister event extensive evaluation including at [facility] and [facility] known to have higher levels of factor VIII and elevated lipoprotein A levels. He takes crestor at home, not on ASA.  Date last known well: 12.20.18 Time last known well: 8.15 am tPA Given: outside window NIHSS: 1 Baseline MRS 0  SUBJECTIVE (INTERVAL HISTORY) His father is at the bedside.  Patient reports  persistent diplopia but wearing eyeglasses with a small patch helps. Numbness has improved but continues to fluctuate.  Complaining of some mild nausea on exam this morning and mild right frontal headache.  Believes his headache is due to looking towards the window for a prolonged period of time this morning while visiting with a friend.  Admits to not having bowel movement since admission 4 days ago.  OBJECTIVE Temp:  [97.7 F (36.5 C)-98.3 F (36.8 C)] 97.7 F (36.5 C) (12/25 1319) Pulse Rate:  [72-84] 72 (12/25 1319) Cardiac Rhythm: Normal sinus rhythm (12/24 2000) Resp:  [12-19] 19 (12/25 1319) BP:  (118-138)/(80-102) 125/90 (12/25 1319) SpO2:  [98 %-100 %] 98 % (12/25 1319)  CBC:  Recent Labs  Lab 04/10/17 1430 04/11/17 1910  04/14/17 0301 04/15/17 0555  WBC 6.1 8.3   < > 6.5 5.3  NEUTROABS 4.4 6.5  --   --   --   HGB 15.4 14.0   < > 13.7 14.3  HCT 44.0 41.5   < > 40.1 41.9  MCV 90.3 91.8   < > 91.3 91.1  PLT 159 153   < > 165 167   < > = values in this interval not displayed.   Basic Metabolic Panel:  Recent Labs  Lab 04/13/17 0320 04/15/17 0555  NA 137 137  K 3.4* 4.0  CL 108 106  CO2 24 21*  GLUCOSE 111* 93  BUN 6 8  CREATININE 0.77 0.82  CALCIUM 8.5* 9.1   Lipid Panel:     Component Value Date/Time   CHOL 141 04/12/2017 0254   TRIG 65 04/12/2017 0254   HDL 71 04/12/2017 0254   CHOLHDL 2.0 04/12/2017 0254   VLDL 13 04/12/2017 0254   LDLCALC 57 04/12/2017 0254   HgbA1c:  Lab Results  Component Value Date   HGBA1C 5.2 04/12/2017   IMAGING  Ct Head Wo Contrast 04/10/2017 IMPRESSION:  Normal head CT.   Ct Angio Neck W Or Wo Contrast 04/11/2017 IMPRESSION:  1. 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 Finding is of uncertain etiology, without overt evidence for dissection or other acute abnormality. Dominant left vertebral artery  widely patent to the vertebrobasilar junction.  2. Otherwise normal CTA of the neck.   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   Mr Brain 23Wo Contrast Mr Maxine GlennMra Neck W Wo Contrast 04/12/2017 IMPRESSION:  Unchanged subcentimeter foci of restricted diffusion consistent with acute nonhemorrhagic infarction, involving the brainstem and inferior cerebellum, RIGHT PICA territory. 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 Of note, a history of neck pain was elicited by the neurologist. Hypercoagulable state resulting in thrombosis is also a consideration, and appropriate lab panel is pending. Except for the RIGHT vertebral occlusion, unremarkable appearing extracranial circulation.   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   Transthoracic Echocardiogram -Normal LV systolic and diastolic function; trace AI; mild TR   TEE postive right to left shunt. No clot  LE venous doppler negative for DVt  TCD Bubble study positive for small right to left shunt  Protein C and S, antithrombin III, lupus anticoagulant, anticardiolipin antibodies negative Factor 8 level sent 04/15/17  PHYSICAL EXAM Vitals:   04/14/17 2300 04/15/17 0605 04/15/17 0924 04/15/17 1319  BP: 126/80 126/87 (!) 138/94 125/90  Pulse: 76 76 80 72  Resp: 18 18 19 19   Temp: 98 F (36.7 C) 98.2 F (36.8 C) 98 F (36.7 C) 97.7 F (36.5 C)  TempSrc: Oral Oral Oral Oral  SpO2:  99% 99% 98%  Weight:      Height:       PHYSICAL EXAM Physical exam: Exam: Gen: NAD Eyes: anicteric sclerae, moist conjunctivae                    CV: no MRG, no carotid bruits, no peripheral edema Mental Status: Alert, follows commands, good historian  Neuro: Speech:    No aphasia, no dysarthria, fluent speech Cranial Nerves:    Right horner Syndrome, 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  EOMI. some nystagmus noted with rapid eye movements.  Visual fields full. Right facial numbness. Face symmetric, Tongue midline. Hearing intact to voice. Shoulder shrug intact Motor Observation:    no involuntary movements noted. Tone appears normal.   Strength:    5/5   Sensation:  Left arm>leg numbness Coordination : mild right finger to nose ataxia Plantars downgoing.  Gait unsteady with  ataxia  ASSESSMENT/PLAN [name] is a 41 y.o. male with history of anxiety, tobacco use, hyperlipidemia, and family history significant for blood clots presenting after recent discharge 04/10/17 for dizziness, gait imbalance, nausea, headache, and progressive of right-sided facial numbness. He returned with worsening of his deficits. He did not receive IV t-PA due to late presentation.  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 Cryptogenic stroke or paradoxical embolism are also possibilities  Resultant  partial Wallenburg syndrome  CT head - normal head CT.   MRI head - Acute nonhemorrhagic subcentimeter Rt inferior cerebellar and Rt medullary infarcts.  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  CTA neck - Occlusion of the  right vertebral artery at the level of the V3 segment.  Carotid Doppler - CTA neck  2D Echo - Normal LV systolic and diastolic function; trace AI; mild TRLDL - 57  TEE small PFO  LE venous dopplers negative  HgbA1c - 5.2  VTE prophylaxis - IV heparin Diet regular Room service appropriate? Yes; Fluid consistency: Thin  No antithrombotic prior to admission, now on heparin IV  Patient counseled to be compliant with his antithrombotic medications  Ongoing aggressive stroke risk factor management  Therapy recommendations: CIR  Disposition: CIR in AM  04/14/17: Discontinue IV heparin drip  and change to aspirin and Plavix for 3 months followed by aspirin alone. Long discussion the patient, wife and father and Dr. Violeta GelinasMc Lean and Bensimhon and answered questions. The patient has a small PFO but in the absence of lower extremity venous clot this may be a innocent bystander. Will follow hypercoagulable panel labs and add factor VIII levels tomorrow after heparin is  discontinued today. If etiology of stroke remains cryptogenic may consider elective PFO closure later as an outpatient. If patient is found to be hypercoagulable he may need long-term anticoagulation.  04/15/17: Neuro exam remains stable.  Right facial numbness slowly improving.  Complaining of some mild nausea this morning and likely constipation.  Plan of care reviewed with patient and his father at bedside.  Plan for CIR in the morning.  Constipation Double dose of MiraLAX now Double dose of senna now, if no results after MiraLAX Senna daily  PFO may consider elective PFO closure later as an outpatient, if etiology of stroke remains cryptogenic Continue ASA and Plavix and statin for now Will need f/u at discharge with Dr. Excell Seltzerooper to discuss PFO closure in future.   Hypertension  Stable  Permissive hypertension (OK if < 220/120) but gradually normalize in 5-7 days  Long-term BP goal normotensive  Hyperlipidemia  Home meds:  Crestor 10 mg daily PTA  LDL 57, goal < 70  Crestor increased to 20 mg daily  Continue statin at discharge  Other Stroke Risk Factors  Former cigarette smoker - quit 14 months ago  ETOH use, advised to drink no more than 1 drink per day.  Family history of clotting disorder-Factor 8 excess  Other Active Problems  Anxiety  Plan / Recommendations  Hospital day # 4  Brita RompMary A Costello, ANP-C Neurology stroke team I have personally examined this patient, reviewed notes, independently viewed imaging studies, participated in medical decision making and plan of care.ROS completed by me personally and pertinent positives fully documented  I have made any additions or clarifications directly to the above note. Agree with note above. Review of 72 hrs of telemetry since admission has not detected AFIB so far. Long d/w patient and father and answered questions.Greatar than 50% time during this 25 minute visit was spent on counselling and coordination of care  about his stroke deficits and answering questions.  Delia HeadyPramod Sethi, MD Medical Director [facility] Pager: [phone] 04/15/2017 3:09 PM  To contact Stroke Continuity provider, please refer to [email or web address]. After hours, contact General Neurology

## 2017-04-15 NOTE — Progress Notes (Addendum)
Advanced Heart Failure Rounding Note   Subjective:    TEE yesterday with small PFO. Positive bubble study with valsalva. TCDs weakly positive. LE dopplers negative.   Hypercoaguable w/u negative so far except for mildly elevated lupus anticoagulant with weakly + mix - which can be seen with heparin. (Factor VIII levels to be drawn today). Heparin stopped per Dr. Pearlean BrownieSethi.   Still with double vision and occasional nausea when turning head  Objective:   Weight Range:  Vital Signs:   Temp:  [97.8 F (36.6 C)-99.3 F (37.4 C)] 98.2 F (36.8 C) (12/25 0605) Pulse Rate:  [65-87] 76 (12/25 0605) Resp:  [11-18] 18 (12/25 0605) BP: (113-150)/(73-105) 126/87 (12/25 0605) SpO2:  [98 %-100 %] 99 % (12/25 0605) Weight:  [74.4 kg (164 lb)] 74.4 kg (164 lb) (12/24 0907) Last BM Date: 04/13/17  Weight change: Filed Weights   04/11/17 1707 04/11/17 2227 04/14/17 0907  Weight: 74.8 kg (165 lb) 74.8 kg (164 lb 14.5 oz) 74.4 kg (164 lb)    Intake/Output:   Intake/Output Summary (Last 24 hours) at 04/15/2017 0650 Last data filed at 04/15/2017 0300 Gross per 24 hour  Intake 962.42 ml  Output 801 ml  Net 161.42 ml     Physical Exam: General:  Well appearing. No resp difficulty HEENT: normal x for dysconjugate gaze Neck: supple. no JVD. Carotids 2+ bilat; no bruits. No lymphadenopathy or thryomegaly appreciated. Cor: PMI nondisplaced. Regular rate & rhythm. No rubs, gallops or murmurs. Lungs: clear Abdomen: soft, nontender, nondistended. No hepatosplenomegaly. No bruits or masses. Good bowel sounds. Extremities: no cyanosis, clubbing, rash, edema Neuro: alert & orientedx3 moves all 4 extremities w/o difficulty. Affect pleasant   Telemetry: NSR 70s No arrhythmias Personally reviewed   Labs: Basic Metabolic Panel: Recent Labs  Lab 04/10/17 1430 04/11/17 1910 04/13/17 0320  NA 140 139 137  K 4.4 4.3 3.4*  CL 106 111 108  CO2 26 24 24   GLUCOSE 144* 120* 111*  BUN 9 8 6     CREATININE 0.95 0.92 0.77  CALCIUM 9.4 8.4* 8.5*    Liver Function Tests: Recent Labs  Lab 04/11/17 1910  AST 21  ALT 20  ALKPHOS 49  BILITOT 0.6  PROT 5.7*  ALBUMIN 3.6   No results for input(s): LIPASE, AMYLASE in the last 168 hours. No results for input(s): AMMONIA in the last 168 hours.  CBC: Recent Labs  Lab 04/10/17 1430 04/11/17 1910 04/13/17 0320 04/14/17 0301 04/15/17 0555  WBC 6.1 8.3 6.0 6.5 5.3  NEUTROABS 4.4 6.5  --   --   --   HGB 15.4 14.0 14.2 13.7 14.3  HCT 44.0 41.5 42.0 40.1 41.9  MCV 90.3 91.8 91.9 91.3 91.1  PLT 159 153 149* 165 167    Cardiac Enzymes: No results for input(s): CKTOTAL, CKMB, CKMBINDEX, TROPONINI in the last 168 hours.  BNP: BNP (last 3 results) No results for input(s): BNP in the last 8760 hours.  ProBNP (last 3 results) No results for input(s): PROBNP in the last 8760 hours.    Other results:  Imaging: No results found.   Medications:     Scheduled Medications: . aspirin  325 mg Oral Daily  . atomoxetine  10 mg Oral Daily  . clopidogrel  75 mg Oral Daily  . FLUoxetine  20 mg Oral Daily  . gadobenate dimeglumine  15 mL Intravenous Once  . multivitamin with minerals  1 tablet Oral Daily  . rosuvastatin  20 mg Oral q1800  Infusions: . sodium chloride 10 mL/hr at 04/14/17 1617    PRN Medications: acetaminophen **OR** acetaminophen (TYLENOL) oral liquid 160 mg/5 mL **OR** acetaminophen, ibuprofen, meclizine, ondansetron (ZOFRAN) IV, ondansetron   Assessment:    41 y/o male with ADD, elevated lipoprotein (a) and FHX of hypercoaguable d/o admitted with R cerebellar stroke   Plan/Discussion:    1. 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 Verterbal artery dissection vs paradoxical embolus via PFO - Hypercoaguable w/u underway. Negative so far. Heparin stopped 12/24 per Dr. Pearlean BrownieSethi. Factor VII levels pending -  TEE with small PFO. Positive bubble study with valsalva. TCDs weakly positive. LE dopplers negative. - Rhythm stable on tele - Per Dr. Pearlean BrownieSethi will treat with ASA and Plavix and statin for now. He does not feel AC is warranted based on data. Will need f/u with Dr. Excell Seltzerooper to discuss PFO closure in future.   Length of Stay: 4    Arvilla Meresaniel Tirrell Buchberger MD 04/15/2017, 6:50 AM  Advanced Heart Failure Team Pager [phone] (M-F; 7a - 4p)  Please contact [facility] for night-coverage after hours (4p -7a ) and weekends on [email or web address]

## 2017-04-16 DIAGNOSIS — I6300 Cerebral infarction due to thrombosis of unspecified precerebral artery: Secondary | ICD-10-CM

## 2017-04-16 LAB — BETA-2-GLYCOPROTEIN I ABS, IGG/M/A
Beta-2 Glyco I IgG: <9 GPI IgG units (ref 0–20)
Beta-2-Glycoprotein I IgA: <9 GPI IgA units (ref 0–25)
Beta-2-Glycoprotein I IgM: <9 GPI IgM units (ref 0–32)

## 2017-04-16 MED ORDER — FLEET ENEMA 7-19 GM/118ML RE ENEM: 1.0000 | ENEMA | Freq: Once | RECTAL | Status: DC

## 2017-04-16 MED ORDER — BISACODYL 10 MG RE SUPP
10.0000 mg | Freq: Once | RECTAL | Status: AC
  Administered 2017-04-16: 10 mg via RECTAL
  Filled 2017-04-16: qty 1

## 2017-04-16 NOTE — Progress Notes (Signed)
Advanced Heart Failure Rounding Note   Subjective:    Walking halls with some difficulty. Stil with double vision and some nausea  Objective:   Weight Range:  Vital Signs:   Temp:  [97.8 F (36.6 C)-99.3 F (37.4 C)] 98 F (36.7 C) (12/26 1346) Pulse Rate:  [74-91] 81 (12/26 1346) Resp:  [17-19] 19 (12/26 1346) BP: (115-141)/(75-91) 133/85 (12/26 1346) SpO2:  [96 %-99 %] 97 % (12/26 1346) Last BM Date: 04/11/17  Weight change: Filed Weights   04/11/17 1707 04/11/17 2227 04/14/17 0907  Weight: 74.8 kg (165 lb) 74.8 kg (164 lb 14.5 oz) 74.4 kg (164 lb)    Intake/Output:   Intake/Output Summary (Last 24 hours) at 04/16/2017 1616 Last data filed at 04/16/2017 1300 Gross per 24 hour  Intake 1 ml  Output -  Net 1 ml     Physical Exam: General:  Well appearing. No resp difficulty HEENT: normal x for dysconjugate gaze Neck: supple. no JVD flat. Carotids 2+ bilat; no bruits. No lymphadenopathy or thryomegaly appreciated. Cor: PMI nondisplaced. Regular rate & rhythm. No rubs, gallops or murmurs. Lungs: clear Abdomen: soft, nontender, nondistended. No hepatosplenomegaly. No bruits or masses. Good bowel sounds. Extremities: no cyanosis, clubbing, rash, edema Neuro: alert & orientedx3, + gait imbalance moves all 4 extremities w/o difficulty. Affect pleasant   Telemetry: NSR 70-80s No arrhythmias Personally reviewed   Labs: Basic Metabolic Panel: Recent Labs  Lab 04/10/17 1430 04/11/17 1910 04/13/17 0320 04/15/17 0555  NA 140 139 137 137  K 4.4 4.3 3.4* 4.0  CL 106 111 108 106  CO2 26 24 24  21*  GLUCOSE 144* 120* 111* 93  BUN 9 8 6 8   CREATININE 0.95 0.92 0.77 0.82  CALCIUM 9.4 8.4* 8.5* 9.1    Liver Function Tests: Recent Labs  Lab 04/11/17 1910  AST 21  ALT 20  ALKPHOS 49  BILITOT 0.6  PROT 5.7*  ALBUMIN 3.6   No results for input(s): LIPASE, AMYLASE in the last 168 hours. No results for input(s): AMMONIA in the last 168  hours.  CBC: Recent Labs  Lab 04/10/17 1430 04/11/17 1910 04/13/17 0320 04/14/17 0301 04/15/17 0555  WBC 6.1 8.3 6.0 6.5 5.3  NEUTROABS 4.4 6.5  --   --   --   HGB 15.4 14.0 14.2 13.7 14.3  HCT 44.0 41.5 42.0 40.1 41.9  MCV 90.3 91.8 91.9 91.3 91.1  PLT 159 153 149* 165 167    Cardiac Enzymes: No results for input(s): CKTOTAL, CKMB, CKMBINDEX, TROPONINI in the last 168 hours.  BNP: BNP (last 3 results) No results for input(s): BNP in the last 8760 hours.  ProBNP (last 3 results) No results for input(s): PROBNP in the last 8760 hours.    Other results:  Imaging: No results found.   Medications:     Scheduled Medications: . aspirin  325 mg Oral Daily  . clopidogrel  75 mg Oral Daily  . FLUoxetine  20 mg Oral Daily  . gadobenate dimeglumine  15 mL Intravenous Once  . multivitamin with minerals  1 tablet Oral Daily  . rosuvastatin  20 mg Oral q1800  . senna  1 tablet Oral QHS  . sodium phosphate  1 enema Rectal Once    Infusions: . sodium chloride 10 mL/hr at 04/14/17 1617    PRN Medications: acetaminophen **OR** acetaminophen (TYLENOL) oral liquid 160 mg/5 mL **OR** acetaminophen, ibuprofen, meclizine, ondansetron (ZOFRAN) IV, ondansetron   Assessment:    [age] y/o male with  ADD, elevated lipoprotein (a) and FHX of hypercoaguable d/o admitted with R cerebellar stroke   Plan/Discussion:    1. 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 Verterbal artery dissection vs paradoxical embolus via small PFO - Hypercoaguable w/u underway. Negative so far. Heparin stopped 12/24 per Dr. Pearlean Brownie. Factor VII levels pending - TEE with small PFO. Positive bubble study with valsalva. TCDs weakly positive. LE dopplers negative. - Rhythm stable on tele - Per Dr. Pearlean Brownie will treat with ASA and Plavix and statin for now. He does not feel AC is warranted based on  data. Will need f/u with Dr. Excell Seltzer to discuss PFO closure in future. I will arrange.      Length of Stay: 5    Arvilla Meres MD 04/16/2017, 4:16 PM  Advanced Heart Failure Team Pager [phone] (M-F; 7a - 4p)  Please contact [facility] for night-coverage after hours (4p -7a ) and weekends on [email or web address]

## 2017-04-16 NOTE — Progress Notes (Signed)
Rehab admissions - I have opened the case with [employer] requesting acute inpatient rehab admission.  I will await call back from insurance carrier.  Call me for questions.  #[phone]

## 2017-04-16 NOTE — Progress Notes (Signed)
Occupational Therapy Treatment Patient Details Name: Jake BameRobert F Skarzynski MRN: [id number] DOB: 06/16/1975 Today's Date: 04/16/2017    History of present illness Jake BameRobert F Andrada is a 41 y.o. male with medical history significant of anxiety , hld comes in with over a day of dizziness and worsening right sided facial numbness. 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   OT comments  Pt continues to demonstrate visual and balance deficits. Required min guard assist for static standing tasks such as oral care at the sink and min assist for dynamic balance during functional mobility. Educated pt on gaze stabilization during functional mobility. Pt with glasses taped for diplopia. D/c plan remains appropriate. Will continue to follow acutely.    Follow Up Recommendations  CIR;Supervision/Assistance - 24 hour    Equipment Recommendations  Other (comment)(TBD at next venue of care)    Recommendations for Other Services      Precautions / Restrictions Precautions Precautions: Fall Precaution Comments: right side lean/list Restrictions Weight Bearing Restrictions: No       Mobility Bed Mobility Overal bed mobility: Modified Independent             General bed mobility comments: Increased time due to dizziness  Transfers Overall transfer level: Needs assistance Equipment used: None Transfers: Sit to/from Stand Sit to Stand: Min assist         General transfer comment: for balance    Balance Overall balance assessment: Needs assistance Sitting-balance support: Feet supported;No upper extremity supported Sitting balance-Leahy Scale: Good     Standing balance support: No upper extremity supported;During functional activity Standing balance-Leahy Scale: Fair Standing balance comment: static standing balance good, dynamic fair                            ADL either performed or assessed with clinical judgement   ADL       Grooming: Min guard;Standing;Oral care               Lower Body Dressing: Sit to/from stand;Minimal assistance Lower Body Dressing Details (indicate cue type and reason): for balance with sit to stand Toilet Transfer: Minimal assistance;Ambulation           Functional mobility during ADLs: Minimal assistance General ADL Comments: Pt reports he taped his own glasses; no diplopia with taping.     Vision       Perception     Praxis      Cognition Arousal/Alertness: Awake/alert Behavior During Therapy: WFL for tasks assessed/performed Overall Cognitive Status: Within Functional Limits for tasks assessed                                          Exercises     Shoulder Instructions       General Comments      Pertinent Vitals/ Pain       Pain Assessment: No/denies pain  Home Living                                          Prior Functioning/Environment              Frequency  Min 3X/week  Progress Toward Goals  OT Goals(current goals can now be found in the care plan section)  Progress towards OT goals: Progressing toward goals  Acute Rehab OT Goals Patient Stated Goal: back to work OT Goal Formulation: With patient  Plan Discharge plan remains appropriate    Co-evaluation                 AM-PAC PT "6 Clicks" Daily Activity     Outcome Measure   Help from another person eating meals?: None Help from another person taking care of personal grooming?: A Little Help from another person toileting, which includes using toliet, bedpan, or urinal?: A Little Help from another person bathing (including washing, rinsing, drying)?: A Little Help from another person to put on and taking off regular upper body clothing?: A Little Help from another person to put on and taking off regular lower body clothing?: A Little 6 Click Score:  19    End of Session Equipment Utilized During Treatment: Gait belt  OT Visit Diagnosis: Unsteadiness on feet (R26.81);Other abnormalities of gait and mobility (R26.89);Other (comment)   Activity Tolerance Patient tolerated treatment well   Patient Left with family/visitor present(sitting EOB)   Nurse Communication Mobility status        Time: [id number] OT Time Calculation (min): 15 min  Charges: OT General Charges $OT Visit: 1 Visit OT Treatments $Therapeutic Activity: 8-22 mins  Kieran Nachtigal A. Brett Albinooffey, M.S., OTR/L Pager: [phone]  Gaye AlkenBailey A Linsy Ehresman 04/16/2017, 2:25 PM

## 2017-04-16 NOTE — Progress Notes (Signed)
Physical Therapy Treatment Patient Details Name: Jake BameRobert F Moore MRN: [id number] DOB: 09/04/1975 Today's Date: 04/16/2017    History of Present Illness Jake BameRobert F Moore is a 41 y.o. male with medical history significant of anxiety , hld comes in with over a day of dizziness and worsening right sided facial numbness. 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 Agreeable to participation in therapy. Pt very motivated. Reports morning are when he feels the most dizzy. He reports occasional nausea associated with mobility. None noted during today's session. Pt remain an excellent candidate for CIR. PT to continue per POC.    Follow Up Recommendations  CIR     Equipment Recommendations  Rolling walker with 5" wheels;3in1 (PT)    Recommendations for Other Services       Precautions / Restrictions Precautions Precautions: Fall Precaution Comments: right side lean/list    Mobility  Bed Mobility Overal bed mobility: Independent             General bed mobility comments: cues for increased time between position changes  Transfers   Equipment used: None   Sit to Stand: Min assist         General transfer comment: assist to stabilize initial standing balance, wide BOS  Ambulation/Gait Ambulation/Gait assistance: Min assist;Mod assist Ambulation Distance (Feet): 200 Feet Assistive device: 1 person hand held assist Gait Pattern/deviations: Step-through pattern;Wide base of support;Staggering right Gait velocity: decreased Gait velocity interpretation: Below normal speed for age/gender General Gait Details: Frequent use of handrail. Mod assist to stabilize LOB x 2. Sway back posture during gait with increased weight shift over RLE. Increased instability noted with fatigue.    Stairs             Wheelchair Mobility    Modified Rankin (Stroke Patients Only) Modified Rankin (Stroke Patients Only) Pre-Morbid Rankin Score: No symptoms Modified Rankin: Moderately severe disability     Balance   Sitting-balance support: Feet supported;No upper extremity supported Sitting balance-Leahy Scale: Good Sitting balance - Comments: Significantly decr tolerance of R lean/weight shift   Standing balance support: Single extremity supported Standing balance-Leahy Scale: Poor Standing balance comment: needs heavy min to mod assist for dynamic movement.                             Cognition Arousal/Alertness: Awake/alert Behavior During Therapy: WFL for tasks assessed/performed Overall Cognitive Status: Within Functional Limits for tasks assessed                                        Exercises      General Comments        Pertinent Vitals/Pain Pain Assessment: No/denies pain    Home Living                      Prior Function            PT Goals (current goals can now be found in the care plan section) Acute Rehab PT Goals Patient Stated Goal: back to work PT Goal Formulation: With patient Time For Goal Achievement: 04/27/17 Potential to Achieve Goals: Good Progress towards PT goals: Progressing toward goals    Frequency  Min 4X/week      PT Plan Current plan remains appropriate    Co-evaluation              AM-PAC PT "6 Clicks" Daily Activity  Outcome Measure  Difficulty turning over in bed (including adjusting bedclothes, sheets and blankets)?: None Difficulty moving from lying on back to sitting on the side of the bed? : None Difficulty sitting down on and standing up from a chair with arms (e.g., wheelchair, bedside commode, etc,.)?: Unable Help needed moving to and from a bed to chair (including a wheelchair)?: A Little Help needed walking in hospital room?: A Little Help needed climbing 3-5  steps with a railing? : A Lot 6 Click Score: 17    End of Session Equipment Utilized During Treatment: Gait belt Activity Tolerance: Patient tolerated treatment well Patient left: in bed;with call bell/phone within reach Nurse Communication: Mobility status PT Visit Diagnosis: Unsteadiness on feet (R26.81);Other abnormalities of gait and mobility (R26.89);Ataxic gait (R26.0);Other symptoms and signs involving the nervous system (R29.898);Dizziness and giddiness (R42)     Time: [id number] PT Time Calculation (min) (ACUTE ONLY): 23 min  Charges:  $Gait Training: 23-37 mins                    G Codes:       Aida RaiderWendy Aemon Koeller, PT  Office # [phone] Pager [phone]     Ilda FoilGarrow, Margurette Brener Rene 04/16/2017, 10:00 AM

## 2017-04-16 NOTE — Progress Notes (Signed)
Rehab admissions - I met with patient this am.  I have called and faxed information to [facility] requesting acute inpatient rehab admission.  Will await call back from insurance case manager.  I do have a rehab bed available today if we get insurance approval today.  Call me for questions.  #[phone]

## 2017-04-16 NOTE — Progress Notes (Signed)
STROKE TEAM PROGRESS NOTE   SUBJECTIVE (INTERVAL HISTORY) His father are at the bedside.  Patient continues to report diplopia but wearing eyeglasses. Walked in hallways with PT but feels he is still leaning to the right. Numbness and H/A has improved but continues to fluctuate. Complaining of constipation  OBJECTIVE Temp:  [97.7 F (36.5 C)-99.3 F (37.4 C)] 97.8 F (36.6 C) (12/26 0500) Pulse Rate:  [72-91] 74 (12/26 0500) Cardiac Rhythm: Normal sinus rhythm (12/26 0700) Resp:  [17-19] 18 (12/26 0500) BP: (115-141)/(75-91) 115/81 (12/26 0500) SpO2:  [96 %-99 %] 99 % (12/26 0500)  CBC:  Recent Labs  Lab 04/10/17 1430 04/11/17 1910  04/14/17 0301 04/15/17 0555  WBC 6.1 8.3   < > 6.5 5.3  NEUTROABS 4.4 6.5  --   --   --   HGB 15.4 14.0   < > 13.7 14.3  HCT 44.0 41.5   < > 40.1 41.9  MCV 90.3 91.8   < > 91.3 91.1  PLT 159 153   < > 165 167   < > = values in this interval not displayed.    Basic Metabolic Panel:  Recent Labs  Lab 04/13/17 0320 04/15/17 0555  NA 137 137  K 3.4* 4.0  CL 108 106  CO2 24 21*  GLUCOSE 111* 93  BUN 6 8  CREATININE 0.77 0.82  CALCIUM 8.5* 9.1   Lipid Panel:     Component Value Date/Time   CHOL 141 04/12/2017 0254   TRIG 65 04/12/2017 0254   HDL 71 04/12/2017 0254   CHOLHDL 2.0 04/12/2017 0254   VLDL 13 04/12/2017 0254   LDLCALC 57 04/12/2017 0254   HgbA1c:  Lab Results  Component Value Date   HGBA1C 5.2 04/12/2017   Urine Drug Screen: No results found for: LABOPIA, COCAINSCRNUR, LABBENZ, AMPHETMU, THCU, LABBARB  Alcohol Level No results found for: ETH  IMAGING  Ct Head Wo Contrast 04/10/2017 IMPRESSION:  Normal head CT.   Ct Angio Neck W Or Wo Contrast 04/11/2017 IMPRESSION:  1. 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 Finding is of uncertain etiology, without overt evidence for dissection or other acute abnormality. Dominant left vertebral artery widely patent to the vertebrobasilar  junction.  2. Otherwise normal CTA of the neck.   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   Mr Brain 46 Contrast Mr Maxine Glenn Neck W Wo Contrast 04/12/2017 IMPRESSION:  Unchanged subcentimeter foci of restricted diffusion consistent with acute nonhemorrhagic infarction, involving the brainstem and inferior cerebellum, RIGHT PICA territory. 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 Of note, a history of neck pain was elicited by the neurologist. Hypercoagulable state resulting in thrombosis is also a consideration, and appropriate lab panel is pending. Except for the RIGHT vertebral occlusion, unremarkable appearing extracranial circulation.   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   Transthoracic Echocardiogram -Normal LV systolic and diastolic function; trace AI; mild TR   TEE postive right to left shunt. No clot  LE venous doppler negative for DVt  TCD Bubble study positive for small right to left shunt  Protein C  activity and S, antithrombin III, lupus anticoagulant negative  PENDING LABS: Factor 8 assay, Hexagonal Phase Phospholipid, Cardiolipin antibodies, Prothrombin gene, Factor 5 Leiden, beta 2 glycoprotein, Protein C total  PHYSICAL EXAM Vitals:   04/15/17 1714 04/15/17 2100 04/16/17 0100 04/16/17 0500  BP: (!) 141/91 127/84 125/75 115/81  Pulse: 85 91 91 74  Resp: 17 18 18 18   Temp: 99.3 F (37.4 C) 97.8 F (36.6 C) 98.3 F (36.8 C) 97.8 F (36.6 C)  TempSrc: Oral Oral Oral Oral  SpO2: 98% 99% 96% 99%  Weight:       Height:       PHYSICAL EXAM Physical exam: Exam: Gen: NAD Eyes: anicteric sclerae, moist conjunctivae                    CV: no MRG, no carotid bruits, no peripheral edema Neuro:Mental Status: Alert, follows commands, good historian Speech: No aphasia, no dysarthria, Fluent speech Cranial Nerves: Right horner Syndrome.  EOMI.  Visual fields full. Right facial numbness. Face symmetric, Tongue midline. Hearing intact to voice. Shoulder shrug intact Motor Observation: no involuntary movements noted. Tone appears normal.  Strength: 5/5 Sensation:  Left arm>leg numbness Coordination : mild right finger to nose ataxia-improving Plantars downgoing.  Gait unsteady with ataxia  ASSESSMENT/PLAN [name] is a 41 y.o. male with history of anxiety, tobacco use, hyperlipidemia, and family history significant for blood clots presenting after recent discharge 04/10/17 for dizziness, gait imbalance, nausea, headache, and progressive of right-sided facial numbness. He returned with worsening of his deficits. He did not receive IV t-PA due to late presentation.  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  Resultant  partial Wallenburg syndrome  CT head - normal head CT.   MRI head - Acute nonhemorrhagic subcentimeter Rt inferior cerebellar and Rt medullary infarcts.  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  Carotid Doppler - CTA neck  2D Echo - Normal LV systolic and diastolic function; trace AI; mild TRLDL - 57  TEE small PFO  LE venous dopplers negative  HgbA1c - 5.2  VTE prophylaxis - IV heparin Diet regular Room service appropriate? Yes; Fluid consistency: Thin  No antithrombotic prior to admission, now on heparin  IV  Patient counseled to be compliant with his antithrombotic medications  Ongoing aggressive stroke risk factor management  Therapy recommendations:  CIR  Disposition:  CIR - insurance pending, likely transfer in AM  04/14/17: Discontinue IV heparin drip  and change to aspirin and Plavix for 3 months followed by aspirin alone. Long discussion the patient, wife and father and Jake Moore and answered questions. The patient has a small PFO but in the absence of lower extremity venous clot this may be a innocent bystander. Will follow hypercoagulable panel labs and add factor VIII levels tomorrow after heparin is discontinued today. If etiology of stroke remains cryptogenic may consider elective PFO closure later as an outpatient. If patient is found to be hypercoagulable he may need long-term anticoagulation.  04/15/17: Neuro exam remains stable.  Right facial numbness slowly improving.  Complaining of some mild nausea this morning and likely constipation.  Plan of care reviewed with patient and his father at bedside.  Plan for CIR  in the morning. Review of 72 hrs of telemetry since admission has not detected AFIB so far. Long d/w patient and father and answered questions  04/16/2017: Neuro exam remains stable. Nystagmus improving. Walked with PT today but continues to c/o ataxia to the Right. C/o constipation despite Miralax/Senna yesterday. Encouraged to increase activity and change diet, Dulcolax suppository/fleets today. Patient verbalizes that he will f/u with Hematology at discharge.  Constipation No results with Miralax and senna given 12/25 Dulcolax suppository today, fleets enema if not effective Will continue to monitor  PFO may consider elective PFO closure later as an outpatient, if etiology of stroke remains cryptogenic Continue ASA and Plavix and statin for now Will need f/u at discharge with Dr. Excell Seltzerooper to discuss PFO closure in  future  Hypertension  Stable  Permissive hypertension (OK if < 220/120) but gradually normalize in 5-7 days  Long-term BP goal normotensive  Hyperlipidemia  Home meds:  Crestor 10 mg daily PTA  LDL 57, goal < 70  Crestor increased to 20 mg daily  Continue statin at discharge  Other Stroke Risk Factors  Former cigarette smoker - quit 14 months ago  ETOH use, advised to drink no more than 1 drink per day.  Family history of clotting disorder-Factor 8 excess  Other Active Problems  Anxiety  Plan / Recommendations  Hospital day # 5  Jake Moore, ANP-C Neurology stroke team 04/16/2017   12:27 PM I have personally examined this patient, reviewed notes, independently viewed imaging studies, participated in medical decision making and plan of care.ROS completed by me personally and pertinent positives fully documented  I have made any additions or clarifications directly to the above note. Agree with note above. I had a long discussion the patient and his father regarding the stroke and plan for rehabilitation and answered questions. Greater than 50% and in this 25 minute visit with was spent on counseling and coordination of care about his tympanic stroke and rehabilitation Jake HeadyPramod Denyla Cortese, MD Medical Director [facility] Pager: [phone] 04/16/2017 1:58 PM  To contact Stroke Continuity provider, please refer to [email or web address]. After hours, contact General Neurology

## 2017-04-16 NOTE — Progress Notes (Signed)
SLP Cancellation Note  Patient Details Name: Jake BameRobert F Moore MRN: [id number] DOB: 02/22/1976   Cancelled treatment:       Reason Eval/Treat Not Completed: Patient declined, no reason specified; pt denies any dysphagia at current time; vocal quality improved; stated his cognitive status would be assessed at next venue of care (CIR); f/u with ST prn.   Tressie StalkerPat Adele Milson, M.S., CCC-SLP 04/16/2017, 4:00 PM

## 2017-04-16 NOTE — PMR Pre-admission (Signed)
PMR Admission Coordinator Pre-Admission Assessment  Patient: Jake Moore is an 41 y.o., male MRN: [id number] DOB: [birth year] Height: 5' 10"  (177.8 cm) Weight: 74.4 kg (164 lb)             Insurance Information HMO:     PPO:       PCP:       IPA:       80/20:       OTHER:  Group # [id number] PRIMARY:  BCBS      Policy#: [id number]      Subscriber:  patient CM Name: Doroteo Glassman      Phone#: [phone]     Fax#: [phone] Pre-Cert#: [id number] X 7 days with update on 04/23/16 if not discharged      Employer: [employer] Benefits:  Phone #: [phone]     Name:  Lavell Islam. 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 Per chart reviewand patient,patient lives with spouse. Independent prior to admission. Works in Product manager. Two-level home with bedroom on main and 3 steps to entry.Wife can assist as needed.Presented 04/11/2017 with dizziness, headache and right-sided facial numbness. 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 MRA/CT angiogram the neck with right vertebral artery right PICA occlusion. Patient did not receive TPA. Vascular surgery consulted for possible dissection that was ruled out. Echocardiogram with ejection fraction of 60% no wall motion  abnormalities. Neurology consulted placed on intravenous heparin changed to aspirin and Plavix. Venous Dopplers lower extremities negative. 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  TEE completed showing small PFO. Positive bubble study with Valsalva and advised to continue aspirin and Plavix therapy with follow-up cranial CT scan showing expected appearance of small right cerebellar infarct without hemorrhage no mass effect .Marland KitchenPhysical and occupational therapy evaluations completed with recommendations of physical medicine rehabilitation consult. Patient to be admitted for a comprehensive inpatient rehabilitation program.  Total: 1=NIH  Past Medical History  Past Medical History:  Diagnosis Date  . Anxiety   . CVA (cerebral vascular accident) [facility])     Family History  family history includes CAD in his maternal grandfather; Other in his sister.  Prior Rehab/Hospitalizations: No previous rehab  Has the patient had major surgery during 100 days prior to admission? No  Current Medications   Current Facility-Administered Medications:  .  0.9 %  sodium chloride infusion, , Intravenous, Continuous, Garvin Fila, MD, Last Rate: 10 mL/hr at 04/14/17 1617 .  acetaminophen (TYLENOL) tablet 650 mg, 650 mg, Oral, Q4H PRN, 650 mg at 04/16/17 1800 **OR** acetaminophen (TYLENOL) solution 650 mg, 650 mg, Per Tube, Q4H PRN **OR** acetaminophen (TYLENOL) suppository 650 mg, 650  mg, Rectal, Q4H PRN, Derrill Kay A, MD .  aspirin tablet 325 mg, 325 mg, Oral, Daily, Garvin Fila, MD, 325 mg at 04/17/17 0858 .  clopidogrel (PLAVIX) tablet 75 mg, 75 mg, Oral, Daily, Garvin Fila, MD, 75 mg at 04/17/17 0859 .  FLUoxetine (PROZAC) capsule 20 mg, 20 mg, Oral, Daily, Derrill Kay A, MD, 20 mg at 04/17/17 0859 .  gadobenate dimeglumine (MULTIHANCE) injection 15 mL, 15 mL, Intravenous, Once, Sarina Ill B, MD .  ibuprofen  (ADVIL,MOTRIN) tablet 200 mg, 200 mg, Oral, Q6H PRN, Phillips Grout, MD, 200 mg at 04/15/17 1952 .  meclizine (ANTIVERT) tablet 25 mg, 25 mg, Oral, TID PRN, Rinehuls, David L, PA-C, 25 mg at 04/15/17 0939 .  multivitamin with minerals tablet 1 tablet, 1 tablet, Oral, Daily, Phillips Grout, MD, 1 tablet at 04/17/17 [phone] .  ondansetron (ZOFRAN) injection 4 mg, 4 mg, Intravenous, Q6H PRN, Phillips Grout, MD .  ondansetron [facility]) tablet 8 mg, 8 mg, Oral, Q8H PRN, Phillips Grout, MD, 8 mg at 04/15/17 0939 .  rosuvastatin (CRESTOR) tablet 20 mg, 20 mg, Oral, q1800, Derrill Kay A, MD, 20 mg at 04/16/17 1752 .  senna (SENOKOT) tablet 8.6 mg, 1 tablet, Oral, QHS, Costello, Mary A, NP .  sodium phosphate (FLEET) 7-19 GM/118ML enema 1 enema, 1 enema, Rectal, Once, Costello, Kayren Eaves, NP  Patients Current Diet: Diet regular Room service appropriate? Yes; Fluid consistency: Thin  Precautions / Restrictions Precautions Precautions: Fall Precaution Comments: right side lean/list Restrictions Weight Bearing Restrictions: No   Has the patient had 2 or more falls or a fall with injury in the past year?No.  Patient reports 0ne fall at the time of his recent stroke.  Prior Activity Level Community (5-7x/wk): Worked FT, was driving.  Home Assistive Devices / Equipment Home Assistive Devices/Equipment: None Home Equipment: Shower seat - built in  Prior Device Use: Indicate devices/aids used by the patient prior to current illness, exacerbation or injury? None  Prior Functional Level Prior Function Level of Independence: Independent Comments: Working in [place]: Did the patient need help bathing, dressing, using the toilet or eating?  Independent  Indoor Mobility: Did the patient need assistance with walking from room to room (with or without device)? Independent  Stairs: Did the patient need assistance with internal or external stairs (with or without device)?  Independent  Functional Cognition: Did the patient need help planning regular tasks such as shopping or remembering to take medications? Independent  Current Functional Level Cognition  Arousal/Alertness: Awake/alert Overall Cognitive Status: Within Functional Limits for tasks assessed Orientation Level: Oriented X4 Attention: Focused, Sustained, Selective Focused Attention: Appears intact Sustained Attention: Appears intact Selective Attention: Appears intact Memory: Impaired Memory Impairment: Other (comment)(delayed recall 4/5) Awareness: Appears intact Problem Solving: Impaired Problem Solving Impairment: Verbal complex(4/5 serial 7s) Safety/Judgment: Appears intact    Extremity Assessment (includes Sensation/Coordination)  Upper Extremity Assessment: RUE deficits/detail, LUE deficits/detail RUE Deficits / Details: strength WFL. Decreased coordination possibly due to vestib and vision deficits LUE Deficits / Details: strength WFL. Decreased coordination possibly due to vestib and vision deficits  Lower Extremity Assessment: Defer to PT evaluation RLE Deficits / Details: Evidence of decr coordination, with wide stance and decr stance time in gait; Strength grossly 5/5 throughout LLE Deficits / Details: Evidence of decr coordination, with wide stance and decr stance time in gait; Strength grossly 5/5 throughout    ADLs  Overall ADL's : Needs assistance/impaired Eating/Feeding: Set  up, Supervision/ safety, Sitting Grooming: Min guard, Standing, Oral care Upper Body Bathing: Minimal assistance Lower Body Bathing: Sit to/from stand, +2 for safety/equipment, Moderate assistance Upper Body Dressing : Minimal assistance, Sitting Lower Body Dressing: Sit to/from stand, Minimal assistance Lower Body Dressing Details (indicate cue type and reason): for balance with sit to stand Toilet Transfer: Minimal assistance, Ambulation Functional mobility during ADLs: Minimal assistance General  ADL Comments: Pt reports he taped his own glasses; no diplopia with taping.    Mobility  Overal bed mobility: Modified Independent Bed Mobility: Supine to Sit, Sit to Supine Supine to sit: Min guard Sit to supine: Min guard General bed mobility comments: Increased time due to dizziness    Transfers  Overall transfer level: Needs assistance Equipment used: None Transfers: Sit to/from Stand Sit to Stand: Min assist General transfer comment: for balance    Ambulation / Gait / Stairs / Wheelchair Mobility  Ambulation/Gait Ambulation/Gait assistance: Min assist, Mod assist Ambulation Distance (Feet): 200 Feet Assistive device: 1 person hand held assist Gait Pattern/deviations: Step-through pattern, Wide base of support, Staggering right General Gait Details: Frequent use of handrail. Mod assist to stabilize LOB x 2. Sway back posture during gait with increased weight shift over RLE. Increased instability noted with fatigue.  Gait velocity: decreased Gait velocity interpretation: Below normal speed for age/gender    Posture / Balance Dynamic Sitting Balance Sitting balance - Comments: Significantly decr tolerance of R lean/weight shift Balance Overall balance assessment: Needs assistance Sitting-balance support: Feet supported, No upper extremity supported Sitting balance-Leahy Scale: Good Sitting balance - Comments: Significantly decr tolerance of R lean/weight shift Standing balance support: No upper extremity supported, During functional activity Standing balance-Leahy Scale: Fair Standing balance comment: static standing balance good, dynamic fair    Special needs/care consideration BiPAP/CPAP No CPM No Continuous Drip IV KVO Dialysis No       Life Vest No Oxygen no Special Bed no Trach Size No Wound Vac (area) No    Skin NO                           Bowel mgmt: Last BM 04/16/17 Bladder mgmt: Voiding in bathroom with assist Diabetic mgmt No    Previous Home  Environment Living Arrangements: Spouse/significant other  Lives With: Spouse Available Help at Discharge: Family, Available 24 hours/day Type of Home: House Home Layout: Two level, Able to live on main level with bedroom/bathroom Home Access: Stairs to enter Entrance Stairs-Rails: None Entrance Stairs-Number of Steps: 3 Bathroom Shower/Tub: Multimedia programmer: Standard Home Care Services: No Additional Comments: Lives with wife and three children  Discharge Living Setting Plans for Discharge Living Setting: Patient's home, House, Lives with (comment)(Lives with wife and 3 children ages 80, 64 and 4) Type of Home at Discharge: House Discharge Home Layout: Two level, Able to live on main level with bedroom/bathroom Alternate Level Stairs-Number of Steps: Flight Discharge Home Access: Stairs to enter Technical brewer of Steps: 4 Does the patient have any problems obtaining your medications?: No  Social/Family/Support Systems Patient Roles: Spouse, Parent(Has a wife and 3 children.) Contact Information: Mrs. Frazer - wife - [phone] Anticipated Caregiver: wife Ability/Limitations of Caregiver: Wife does not work and can assist. Careers adviser: 24/7 Discharge Plan Discussed with Primary Caregiver: Yes Is Caregiver In Agreement with Plan?: Yes Does Caregiver/Family have Issues with Lodging/Transportation while Pt is in Rehab?: No  Goals/Additional Needs Patient/Family Goal for Rehab: PT/OT mod I and supervision  goals Expected length of stay: 7-10 days Cultural Considerations: None Dietary Needs: Regular diet, thin liquids Equipment Needs: TBD Pt/Family Agrees to Admission and willing to participate: Yes Program Orientation Provided & Reviewed with Pt/Caregiver Including Roles  & Responsibilities: Yes  Decrease burden of Care through IP rehab admission: N/A  Possible need for SNF placement upon discharge: Not anticipated  Patient Condition: This  patient's medical and functional status has changed since the consult dated: 04/14/17 in which the Rehabilitation Physician determined and documented that the patient's condition is appropriate for intensive rehabilitative care in an inpatient rehabilitation facility. See "History of Present Illness" (above) for medical update. Functional changes are:  Currently requiring min to mod assist to ambulate 200 feet +1 HHA. Patient's medical and functional status update has been discussed with the Rehabilitation physician and patient remains appropriate for inpatient rehabilitation. Will admit to inpatient rehab today.  Preadmission Screen Completed By:  Retta Diones, 04/17/2017 9:57 AM ______________________________________________________________________   Discussed status with Dr. Posey Pronto on 04/17/17 at [phone] and received telephone approval for admission today.  Admission Coordinator:  Retta Diones, time 0959/Date 04/17/17

## 2017-04-17 ENCOUNTER — Encounter (HOSPITAL_COMMUNITY): Payer: Self-pay | Admitting: *Deleted

## 2017-04-17 ENCOUNTER — Inpatient Hospital Stay (HOSPITAL_COMMUNITY)
Admission: RE | Admit: 2017-04-17 | Discharge: 2017-04-21 | DRG: 057 | Disposition: A | Discharge status: Home / Self Care | Payer: BLUE CROSS/BLUE SHIELD | Source: Intra-hospital | Attending: Physical Medicine & Rehabilitation | Admitting: Physical Medicine & Rehabilitation

## 2017-04-17 ENCOUNTER — Other Ambulatory Visit: Payer: Self-pay

## 2017-04-17 DIAGNOSIS — Q211 Atrial septal defect: Secondary | ICD-10-CM

## 2017-04-17 DIAGNOSIS — Z880 Allergy status to penicillin: Secondary | ICD-10-CM | POA: Diagnosis not present

## 2017-04-17 DIAGNOSIS — N179 Acute kidney failure, unspecified: Secondary | ICD-10-CM | POA: Diagnosis present

## 2017-04-17 DIAGNOSIS — G463 Brain stem stroke syndrome: Secondary | ICD-10-CM

## 2017-04-17 DIAGNOSIS — I69398 Other sequelae of cerebral infarction: Secondary | ICD-10-CM

## 2017-04-17 DIAGNOSIS — K5900 Constipation, unspecified: Secondary | ICD-10-CM | POA: Diagnosis not present

## 2017-04-17 DIAGNOSIS — Z87891 Personal history of nicotine dependence: Secondary | ICD-10-CM

## 2017-04-17 DIAGNOSIS — R209 Unspecified disturbances of skin sensation: Secondary | ICD-10-CM

## 2017-04-17 DIAGNOSIS — I63012 Cerebral infarction due to thrombosis of left vertebral artery: Secondary | ICD-10-CM

## 2017-04-17 DIAGNOSIS — E785 Hyperlipidemia, unspecified: Secondary | ICD-10-CM | POA: Diagnosis present

## 2017-04-17 DIAGNOSIS — F419 Anxiety disorder, unspecified: Secondary | ICD-10-CM | POA: Diagnosis present

## 2017-04-17 DIAGNOSIS — Z8249 Family history of ischemic heart disease and other diseases of the circulatory system: Secondary | ICD-10-CM | POA: Diagnosis not present

## 2017-04-17 DIAGNOSIS — I63011 Cerebral infarction due to thrombosis of right vertebral artery: Secondary | ICD-10-CM | POA: Diagnosis not present

## 2017-04-17 DIAGNOSIS — I6302 Cerebral infarction due to thrombosis of basilar artery: Secondary | ICD-10-CM | POA: Diagnosis not present

## 2017-04-17 DIAGNOSIS — R7401 Elevation of levels of liver transaminase levels: Secondary | ICD-10-CM

## 2017-04-17 DIAGNOSIS — I6300 Cerebral infarction due to thrombosis of unspecified precerebral artery: Secondary | ICD-10-CM

## 2017-04-17 DIAGNOSIS — I69393 Ataxia following cerebral infarction: Secondary | ICD-10-CM | POA: Diagnosis not present

## 2017-04-17 DIAGNOSIS — H532 Diplopia: Secondary | ICD-10-CM | POA: Diagnosis present

## 2017-04-17 DIAGNOSIS — R740 Nonspecific elevation of levels of transaminase and lactic acid dehydrogenase [LDH]: Secondary | ICD-10-CM | POA: Diagnosis present

## 2017-04-17 DIAGNOSIS — I639 Cerebral infarction, unspecified: Secondary | ICD-10-CM | POA: Insufficient documentation

## 2017-04-17 DIAGNOSIS — Z148 Genetic carrier of other disease: Secondary | ICD-10-CM | POA: Diagnosis not present

## 2017-04-17 DIAGNOSIS — I63541 Cerebral infarction due to unspecified occlusion or stenosis of right cerebellar artery: Secondary | ICD-10-CM

## 2017-04-17 DIAGNOSIS — F411 Generalized anxiety disorder: Secondary | ICD-10-CM | POA: Diagnosis not present

## 2017-04-17 LAB — CBC WITH DIFFERENTIAL/PLATELET
Basophils Absolute: 0 10*3/uL (ref 0.0–0.1)
Basophils Relative: 0 %
EOS ABS: 0.1 10*3/uL (ref 0.0–0.7)
Eosinophils Relative: 2 %
HEMATOCRIT: 42.3 % (ref 39.0–52.0)
HEMOGLOBIN: 14.5 g/dL (ref 13.0–17.0)
LYMPHS ABS: 1.6 10*3/uL (ref 0.7–4.0)
Lymphocytes Relative: 28 %
MCH: 31.5 pg (ref 26.0–34.0)
MCHC: 34.3 g/dL (ref 30.0–36.0)
MCV: 92 fL (ref 78.0–100.0)
MONO ABS: 0.8 10*3/uL (ref 0.1–1.0)
MONOS PCT: 14 %
NEUTROS PCT: 56 %
Neutro Abs: 3.2 10*3/uL (ref 1.7–7.7)
Platelets: 165 10*3/uL (ref 150–400)
RBC: 4.6 MIL/uL (ref 4.22–5.81)
RDW: 12.1 % (ref 11.5–15.5)
WBC: 5.8 10*3/uL (ref 4.0–10.5)

## 2017-04-17 LAB — COMPREHENSIVE METABOLIC PANEL
ALK PHOS: 65 U/L (ref 38–126)
ALT: 125 U/L — ABNORMAL HIGH (ref 17–63)
ANION GAP: 10 (ref 5–15)
AST: 83 U/L — ABNORMAL HIGH (ref 15–41)
Albumin: 3.9 g/dL (ref 3.5–5.0)
BILIRUBIN TOTAL: 0.4 mg/dL (ref 0.3–1.2)
BUN: 12 mg/dL (ref 6–20)
CALCIUM: 9.2 mg/dL (ref 8.9–10.3)
CO2: 27 mmol/L (ref 22–32)
Chloride: 103 mmol/L (ref 101–111)
Creatinine, Ser: 1.02 mg/dL (ref 0.61–1.24)
GFR calc non Af Amer: >60 mL/min (ref >60)
Glucose, Bld: 90 mg/dL (ref 65–99)
POTASSIUM: 4.4 mmol/L (ref 3.5–5.1)
SODIUM: 140 mmol/L (ref 135–145)
TOTAL PROTEIN: 6.2 g/dL — AB (ref 6.5–8.1)

## 2017-04-17 LAB — CARDIOLIPIN ANTIBODIES, IGG, IGM, IGA: Anticardiolipin IgM: <9 MPL U/mL (ref 0–12)

## 2017-04-17 LAB — PROTEIN C, TOTAL: PROTEIN C, TOTAL: 82 % (ref 60–150)

## 2017-04-17 MED ORDER — FLEET ENEMA 7-19 GM/118ML RE ENEM: 1.0000 | ENEMA | Freq: Every day | RECTAL | Status: DC | PRN

## 2017-04-17 MED ORDER — TRAZODONE HCL 50 MG PO TABS
50.0000 mg | ORAL_TABLET | Freq: Every evening | ORAL | Status: DC | PRN
Start: 2017-04-17 — End: 2017-04-21

## 2017-04-17 MED ORDER — ROSUVASTATIN CALCIUM 20 MG PO TABS
20.0000 mg | ORAL_TABLET | Freq: Every day | ORAL | Status: DC
  Administered 2017-04-17 – 2017-04-20 (×4): 20 mg via ORAL
  Filled 2017-04-17 (×4): qty 1

## 2017-04-17 MED ORDER — ALUM & MAG HYDROXIDE-SIMETH 200-200-20 MG/5ML PO SUSP: 30.0000 mL | ORAL | Status: DC | PRN

## 2017-04-17 MED ORDER — MECLIZINE HCL 25 MG PO TABS: 25.0000 mg | ORAL_TABLET | Freq: Three times a day (TID) | ORAL | Status: DC | PRN

## 2017-04-17 MED ORDER — FLUOXETINE HCL 20 MG PO CAPS
20.0000 mg | ORAL_CAPSULE | Freq: Every day | ORAL | Status: DC
  Administered 2017-04-18 – 2017-04-21 (×4): 20 mg via ORAL
  Filled 2017-04-17 (×4): qty 1

## 2017-04-17 MED ORDER — ASPIRIN 325 MG PO TABS
325.0000 mg | ORAL_TABLET | Freq: Every day | ORAL | Status: DC
  Administered 2017-04-18 – 2017-04-21 (×4): 325 mg via ORAL
  Filled 2017-04-17 (×4): qty 1

## 2017-04-17 MED ORDER — ONDANSETRON HCL 4 MG PO TABS: 4.0000 mg | ORAL_TABLET | Freq: Four times a day (QID) | ORAL | Status: DC | PRN

## 2017-04-17 MED ORDER — ACETAMINOPHEN 325 MG PO TABS
650.0000 mg | ORAL_TABLET | Freq: Four times a day (QID) | ORAL | Status: DC | PRN
  Administered 2017-04-17 – 2017-04-20 (×5): 650 mg via ORAL
  Filled 2017-04-17 (×6): qty 2

## 2017-04-17 MED ORDER — ONDANSETRON HCL 4 MG/2ML IJ SOLN
4.0000 mg | Freq: Four times a day (QID) | INTRAMUSCULAR | Status: DC | PRN
Start: 2017-04-17 — End: 2017-04-21

## 2017-04-17 MED ORDER — CLOPIDOGREL BISULFATE 75 MG PO TABS
75.0000 mg | ORAL_TABLET | Freq: Every day | ORAL | Status: DC
  Administered 2017-04-18 – 2017-04-21 (×4): 75 mg via ORAL
  Filled 2017-04-17 (×4): qty 1

## 2017-04-17 MED ORDER — SENNOSIDES-DOCUSATE SODIUM 8.6-50 MG PO TABS
2.0000 | ORAL_TABLET | Freq: Every day | ORAL | Status: DC
  Administered 2017-04-17 – 2017-04-20 (×3): 2 via ORAL
  Filled 2017-04-17 (×4): qty 2

## 2017-04-17 MED ORDER — ADULT MULTIVITAMIN W/MINERALS CH
1.0000 | ORAL_TABLET | Freq: Every day | ORAL | Status: DC
  Administered 2017-04-18 – 2017-04-21 (×4): 1 via ORAL
  Filled 2017-04-17 (×4): qty 1

## 2017-04-17 MED ORDER — BISACODYL 10 MG RE SUPP: 10.0000 mg | Freq: Every day | RECTAL | Status: DC | PRN

## 2017-04-17 NOTE — IPOC Note (Signed)
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   Altered sensation due to recent stroke   Generalized anxiety disorder   Transaminitis     Functional Problem List: Nursing Motor, Perception, Sensory, Pain  PT Balance, Endurance, Safety, Sensory, Motor  OT Balance, Safety, Sensory, Motor, Vision  SLP    TR         Basic ADL's: OT Grooming, Bathing, Dressing, Toileting     Advanced  ADL's: OT       Transfers: PT Bed Mobility, Bed to Chair, Car, State Street CorporationFurniture, Civil Service fast streamerloor  OT Toilet, Research scientist (life sciences)Tub/Shower     Locomotion: PT Ambulation, Psychologist, prison and probation servicesWheelchair Mobility, Stairs, Other (comment)     Additional Impairments: OT None  SLP        TR      Anticipated Outcomes Item Anticipated Outcome  Self Feeding n/  Swallowing      Basic self-care  mod I   Toileting  mod I    Bathroom Transfers mod I   Bowel/Bladder  Pt will manage bowel and bladder with mod I assist at discharge   Transfers  Mod I   Locomotion  Mod I   Communication     Cognition     Pain  Pt will manage pain at 3 or less on a scale of 0-10.   Safety/Judgment  Pt will remain free of falls and injury with mod I assist.    Therapy Plan: PT Intensity: Minimum of 1-2 x/day ,45 to 90 minutes PT Frequency: 5 out of 7 days PT Duration Estimated Length of Stay: 7-10 days OT Intensity: Minimum of 1-2 x/day, 45 to 90 minutes OT Frequency: 5 out of 7 days OT Duration/Estimated Length of Stay: 7-10 days      Team Interventions: Nursing Interventions Patient/Family Education, Pain Management, Disease Management/Prevention, Discharge Planning  PT interventions Ambulation/gait training, Cognitive remediation/compensation, Discharge planning, DME/adaptive equipment instruction, Functional  mobility training, Pain management, Psychosocial support, Splinting/orthotics, Therapeutic Activities, UE/LE Strength taining/ROM, Visual/perceptual remediation/compensation, Wheelchair propulsion/positioning, UE/LE Coordination activities, Therapeutic Exercise, Stair training, Skin care/wound management, Neuromuscular re-education, Patient/family education, Functional electrical stimulation, Disease management/prevention, FirefighterCommunity reintegration, Warden/rangerBalance/vestibular training  OT Interventions Warden/rangerBalance/vestibular training, Discharge planning, Functional electrical stimulation, Pain management, Self Care/advanced ADL retraining, Therapeutic Activities, UE/LE Coordination activities, Disease mangement/prevention, Functional mobility training, Patient/family education, Therapeutic Exercise, Visual/perceptual remediation/compensation, FirefighterCommunity reintegration, Fish farm managerDME/adaptive equipment instruction, Neuromuscular re-education, Psychosocial support, UE/LE Strength taining/ROM  SLP Interventions    TR Interventions    SW/CM Interventions Discharge Planning, Psychosocial Support, Patient/Family Education   Barriers to Discharge MD  Medical stability  Nursing      PT Home environment access/layout    OT      SLP      SW       Team Discharge Planning: Destination: PT-Home ,OT- Home , SLP-  Projected Follow-up: PT-Outpatient PT, OT-  Outpatient OT, SLP-  Projected Equipment Needs: PT-To be determined, OT- To be determined, SLP-  Equipment Details: PT- , OT-  Patient/family involved in discharge planning: PT- Patient, Family member/caregiver,  OT-Patient, SLP-   MD ELOS: 4-8 days. Medical Rehab Prognosis:  Good Assessment: 41 y.o.right handed malewith history of anxiety/ADD, hyperlipidemiawho was admitted on 04/11/17 with HA, dizziness and right facial numbness. MRI brain done revealingacute non-hemorrhagic infarcts right inferior cerebellar and right medullar infarcts related to  distal R-VA and R-PICA  occlusion.TEEdone revealing small PFOwith positive bubble study and BLE dopplers were negative for DVT. Repeat cranial CT scandone 12/23 due tofluctuatingsymptoms and showed no hemorrhagesmall right cerebellar infarct without hemorrhage.Dr. 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 He is to follow up with hematology after discharge. Therapy ongoing and patient with functional deficits due to diplopia, dizziness, intermittent nausea and balance deficits. Will set goals for Mod I with PT/OT.   See Team Conference Notes for weekly updates to the plan of care

## 2017-04-17 NOTE — H&P (Signed)
Physical Medicine and Rehabilitation Admission H&P     Chief Complaint  Patient presents with  . Functional deficits due to stroke  . Nausea, dizziness and double vision    HPI:  Jake Moore is a 41 y.o. right handed male with history of anxiety/ADD, hyperlipidemia who was admitted on 04/11/17 with HA, dizziness and right facial numbness. 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  TEE done revealing small PFO with positive bubble study and BLE dopplers were negative for DVT.  Repeat cranial CT scan done 12/23 due to fluctuating symptoms and showed no hemorrhage small right cerebellar infarct without hemorrhage.  Dr. Pearlean Brownie felt that stroke extracranial dissection at C1 level v/s hypercoagulable state (family history of Factor 8 excess) and he was started on IV heparin and transitioned to ASA/Plavix. He is to follow up with hematology after discharge. Therapy ongoing and patient with functional deficits due to diplopia, dizziness, intermittent nausea and balance deficits. CIR recommended for follow up therapy.    No hiccups, occ blurry vision while looking to right, numbness Right side of face , decreased temperature sensation Left arm and leg   Review of Systems  HENT: Negative for hearing loss and tinnitus.   Eyes: Positive for double vision and pain (right eye pain with HA). Negative for blurred vision.  Respiratory: Negative for cough and shortness of breath.   Cardiovascular: Negative for chest pain and palpitations.       Chest wall discomfort?  Gastrointestinal: Positive for heartburn. Negative for abdominal pain, constipation and nausea.  Genitourinary: Negative for dysuria and urgency.  Musculoskeletal: Negative for back pain and myalgias.  Skin: Negative for itching and rash.  Neurological: Positive for dizziness, weakness and headaches.  Psychiatric/Behavioral:  Negative for depression and memory loss. The patient has insomnia. The patient is not nervous/anxious.           Past Medical History:  Diagnosis Date  . Anxiety    . CVA (cerebral vascular accident) [facility])             Past Surgical History:  Procedure Laterality Date  . TEE WITHOUT CARDIOVERSION N/A 04/14/2017    Procedure: TRANSESOPHAGEAL ECHOCARDIOGRAM (TEE);  Surgeon: Dolores Patty, MD;  Location: [facility] ENDOSCOPY;  Service: Cardiovascular;  Laterality: N/A;           Family History  Problem Relation Age of Onset  . Other Sister          hypercoagulable state with SCAD  . 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 He  reports that he quit smoking about 15 months ago. His smoking use included cigarettes. He quit after 20.00 years of use. He has quit using smokeless tobacco. His smokeless tobacco use included chew. He reports that he drinks about 2-3 drinks a day (more on the weekends)-- beer, wine or bourbon.  He reports that he does not use drugs.          Allergies  Allergen Reactions  . Penicillins Hives      Has patient had a PCN reaction causing immediate rash, facial/tongue/throat swelling, SOB or lightheadedness with hypotension: YES Has patient had a PCN reaction causing severe rash involving mucus membranes or skin necrosis: NO Has patient had a PCN reaction that required hospitalizationNO Has patient had a PCN reaction occurring within the last 10 years: NO If all  of the above answers are "NO", then may proceed with Cephalosporin use.            Medications Prior to Admission  Medication Sig Dispense Refill  . atomoxetine (STRATTERA) 10 MG capsule Take 10 mg by mouth daily.      Marland Kitchen. FLUoxetine (PROZAC) 20 MG tablet Take 20 mg by mouth daily.      Marland Kitchen. ibuprofen (ADVIL,MOTRIN) 200 MG tablet Take 200 mg by mouth every 6 (six) hours as needed for moderate pain.      . meclizine (ANTIVERT) 25 MG tablet Take 1 tablet (25  mg total) by mouth 3 (three) times daily as needed for dizziness. 15 tablet 0  . Multiple Vitamin (MULTIVITAMIN) tablet Take 1 tablet by mouth daily.      . ondansetron (ZOFRAN) 8 MG tablet Take 1 tablet (8 mg total) by mouth every 8 (eight) hours as needed for nausea or vomiting. 10 tablet 0  . rosuvastatin (CRESTOR) 10 MG tablet Take 10 mg by mouth daily.      . naproxen (NAPROSYN) 500 MG tablet Take 1 tablet (500 mg total) by mouth 2 (two) times daily. (Patient not taking: Reported on 07/14/2016) 30 tablet 0      Drug Regimen Review  Drug regimen was reviewed and remains appropriate with no significant issues identified   Home: Home Living Family/patient expects to be discharged to:: Private residence Living Arrangements: Spouse/significant other Available Help at Discharge: Family, Available 24 hours/day Type of Home: House Home Access: Stairs to enter Entergy CorporationEntrance Stairs-Number of Steps: 3 Entrance Stairs-Rails: None Home Layout: Two level, Able to live on main level with bedroom/bathroom Bathroom Shower/Tub: Health visitorWalk-in shower Bathroom Toilet: Standard Home Equipment: Shower seat - built in Additional Comments: Lives with wife and three children  Lives With: Spouse   Functional History: Prior Function Level of Independence: Independent Comments: Working in Merchandiser, retailcommercial real-estate   Functional Status:  Mobility: Bed Mobility Overal bed mobility: Modified Independent Bed Mobility: Supine to Sit, Sit to Supine Supine to sit: Min guard Sit to supine: Min guard General bed mobility comments: Increased time due to dizziness Transfers Overall transfer level: Needs assistance Equipment used: None Transfers: Sit to/from Stand Sit to Stand: Min assist General transfer comment: for balance Ambulation/Gait Ambulation/Gait assistance: Min assist, Mod assist Ambulation Distance (Feet): 200 Feet Assistive device: 1 person hand held assist Gait Pattern/deviations: Step-through pattern,  Wide base of support, Staggering right General Gait Details: Frequent use of handrail. Mod assist to stabilize LOB x 2. Sway back posture during gait with increased weight shift over RLE. Increased instability noted with fatigue.  Gait velocity: decreased Gait velocity interpretation: Below normal speed for age/gender   ADL: ADL Overall ADL's : Needs assistance/impaired Eating/Feeding: Set up, Supervision/ safety, Sitting Grooming: Min guard, Standing, Oral care Upper Body Bathing: Minimal assistance Lower Body Bathing: Sit to/from stand, +2 for safety/equipment, Moderate assistance Upper Body Dressing : Minimal assistance, Sitting Lower Body Dressing: Sit to/from stand, Minimal assistance Lower Body Dressing Details (indicate cue type and reason): for balance with sit to stand Toilet Transfer: Minimal assistance, Ambulation Functional mobility during ADLs: Minimal assistance General ADL Comments: Pt reports he taped his own glasses; no diplopia with taping.   Cognition: Cognition Overall Cognitive Status: Within Functional Limits for tasks assessed Arousal/Alertness: Awake/alert Orientation Level: Oriented X4 Attention: Focused, Sustained, Selective Focused Attention: Appears intact Sustained Attention: Appears intact Selective Attention: Appears intact Memory: Impaired Memory Impairment: Other (comment)(delayed recall 4/5) Awareness: Appears intact Problem Solving: Impaired Problem  Solving Impairment: Verbal complex(4/5 serial 7s) Safety/Judgment: Appears intact Cognition Arousal/Alertness: Awake/alert Behavior During Therapy: WFL for tasks assessed/performed Overall Cognitive Status: Within Functional Limits for tasks assessed     Blood pressure 117/79, pulse 77, temperature 98.6 F (37 C), temperature source Oral, resp. rate 18, height 5\' 10"  (1.778 m), weight 74.4 kg (164 lb), SpO2 97 %. Physical Exam  Nursing note and vitals reviewed. Constitutional: He is oriented  to person, place, and time. He appears well-developed and well-nourished. No distress.  HENT:  Head: Normocephalic and atraumatic.  Mouth/Throat: Oropharynx is clear and moist.  Eyes: Conjunctivae and EOM are normal.  Neck: Normal range of motion. Neck supple.  Cardiovascular: Normal rate and regular rhythm.  Respiratory: Effort normal and breath sounds normal. No stridor.  GI: Soft. Bowel sounds are normal. He exhibits no distension. There is no tenderness.  Musculoskeletal: He exhibits no edema or tenderness.  Neurological: He is alert and oriented to person, place, and time.  Few beat nystgamus to right lateral field. Speech clear. Able to follow commands without difficulty. Finger to nose intact. Mild right pronator drift.   Skin: Skin is warm and dry. He is not diaphoretic.  Psychiatric: He has a normal mood and affect. His behavior is normal. Judgment and thought content normal.   mild dysmetria Right heel to shin Reduce right facial sensation to touch Motor 5/5 in Bilateral delt , Bi, Tri, Grip, HF, KE, ADF   Lab Results Last 48 Hours  No results found for this or any previous visit (from the past 48 hour(s)).   Imaging Results (Last 48 hours)  No results found.           Medical Problem List and Plan: 1.  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  DVT Prophylaxis/Anticoagulation: Pharmaceutical: Lovenox 3. Pain Management: tylenol prn 4. Mood: LCSW to follow for evaluation and support.  5. Neuropsych: This patient is capable of making decisions on his own behalf. 6. Skin/Wound Care: routine pressure relief measures 7. Fluids/Electrolytes/Nutrition: Monitor I/O. Check lytes in am.  8. Constipation: Augment bowel regimen 9. Small PFO: To follow with Dr. Excell Seltzerooper on outpatient basis--may need closure if source crytogenic.   10. Anxiety disorder: continue Prozac.            Post Admission Physician Evaluation: 1. Functional deficits secondary  to  Wallenberg syndrome. 2. Patient is admitted to receive collaborative, interdisciplinary care between the physiatrist, rehab nursing staff, and therapy team. 3. Patient's level of medical complexity and substantial therapy needs in context of that medical necessity cannot be provided at a lesser intensity of care such as a SNF. 4. Patient has experienced substantial functional loss from his/her baseline which was documented above under the "Functional History" and "Functional Status" headings.  Judging by the patient's diagnosis, physical exam, and functional history, the patient has potential for functional progress which will result in measurable gains while on inpatient rehab.  These gains will be of substantial and practical use upon discharge  in facilitating mobility and self-care at the household level. 5. Physiatrist will provide 24 hour management of medical needs as well as oversight of the therapy plan/treatment and provide guidance as appropriate regarding the interaction of the two. 6. The Preadmission Screening has been reviewed and patient status is unchanged unless otherwise stated above. 7. 24 hour rehab nursing will assist with bladder management, bowel management, safety, skin/wound care, disease management, medication administration and patient education  and help integrate therapy concepts, techniques,education,  etc. 8. PT will assess and treat for/with: pre gait, gait training, endurance , safety, equipment, neuromuscular re education.   Goals are: Mod I. 9. OT will assess and treat for/with: ADLs, Cognitive perceptual skills, Neuromuscular re education, safety, endurance, equipment.   Goals are: Mod I. Therapy May proceed with showering this patient. 10. SLP will assess and treat for/with: NA.  Goals are: NA. 11. Case Management and Social Worker will assess and treat for psychological issues and discharge planning. 12. Team conference will be held weekly to assess progress toward  goals and to determine barriers to discharge. 13. Patient will receive at least 3 hours of therapy per day at least 5 days per week. 14. ELOS: 7-10d       15. Prognosis:  excellent         Erick Colace M.D. [facility] FAAPM&R (Sports Med, Neuromuscular Med) Diplomate Am Board of Electrodiagnostic Med  Jerene Pitch 04/17/2017

## 2017-04-17 NOTE — Progress Notes (Signed)
Pt admitted to 4M04 from 3W16. Pt has father at bedside. Pt oriented to unit and in no pain or acute distress. Vitals are stable. Continue plan of care.

## 2017-04-17 NOTE — Progress Notes (Signed)
Patient on speech caseload but discharging to CIR today. Will defer f/u treatment to CIR.   Ferdinand LangoLeah Ethal Gotay MA, CCC-SLP [phone]

## 2017-04-17 NOTE — Discharge Summary (Signed)
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 Anxiety   . CVA (cerebral vascular accident) [facility](HCC)    Past Surgical History:  Procedure Laterality Date  . TEE WITHOUT CARDIOVERSION N/A 04/14/2017   Procedure: TRANSESOPHAGEAL ECHOCARDIOGRAM (TEE);  Surgeon: Dolores PattyBensimhon, Daniel R, MD;  Location[facility] ENDOSCOPY;  Service: Cardiovascular;  Laterality: N/A;   Medications to be continued on Rehab . aspirin  325 mg Oral Daily  . clopidogrel  75 mg Oral Daily  . FLUoxetine  20 mg Oral Daily  . gadobenate dimeglumine  15 mL Intravenous Once  . multivitamin with minerals  1 tablet Oral Daily  . rosuvastatin  20 mg Oral q1800  . senna  1 tablet Oral QHS  . sodium phosphate  1 enema Rectal Once   LABORATORY STUDIES CBC    Component Value Date/Time   WBC 5.3 04/15/2017 0555   RBC 4.60 04/15/2017 0555   HGB 14.3 04/15/2017 0555   HCT 41.9 04/15/2017 0555   PLT 167 04/15/2017 0555   MCV 91.1 04/15/2017 0555   MCH 31.1 04/15/2017 0555   MCHC 34.1 04/15/2017 0555   RDW 12.3 04/15/2017 0555   LYMPHSABS 1.0 04/11/2017 1910   MONOABS 0.7 04/11/2017 1910   EOSABS 0.1 04/11/2017 1910   BASOSABS 0.0 04/11/2017 1910   CMP    Component Value Date/Time   NA 137  04/15/2017 0555   K 4.0 04/15/2017 0555   CL 106 04/15/2017 0555   CO2 21 (L) 04/15/2017 0555   GLUCOSE 93 04/15/2017 0555   BUN 8 04/15/2017 0555   CREATININE 0.82 04/15/2017 0555   CALCIUM 9.1 04/15/2017 0555   PROT 5.7 (L) 04/11/2017 1910   ALBUMIN 3.6 04/11/2017 1910   AST 21 04/11/2017 1910   ALT 20 04/11/2017 1910   ALKPHOS 49 04/11/2017 1910   BILITOT 0.6 04/11/2017 1910   GFRNONAA >60 04/15/2017 0555   GFRAA >60 04/15/2017 0555   Lipid Panel    Component Value Date/Time   CHOL 141 04/12/2017 0254   TRIG 65 04/12/2017 0254   HDL 71 04/12/2017 0254   CHOLHDL 2.0 04/12/2017 0254   VLDL 13 04/12/2017 0254   LDLCALC 57 04/12/2017 0254   HgbA1C  Lab Results  Component Value Date   HGBA1C 5.2 04/12/2017   SIGNIFICANT DIAGNOSTIC STUDIES Ct Head Wo Contrast 04/10/2017 IMPRESSION:  Normal head CT.   Ct Angio Neck W Or Wo Contrast 04/11/2017 IMPRESSION:  1. 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 Finding is of uncertain etiology, without overt evidence for dissection or other acute abnormality. Dominant left vertebral artery widely patent to the vertebrobasilar junction.  2. Otherwise normal CTA of the neck.   Mr Angiogram Head W Or Wo Contrast 04/11/2017 IMPRESSION:  Acute nonhemorrhagic subcentimeter  RIGHT inferior cerebellar and RIGHT medullary infarcts, related to distal RIGHT vertebral artery and RIGHT PICA occlusion.   Mr Brain 20 Contrast Mr Maxine Glenn Neck W Wo Contrast 04/12/2017 IMPRESSION:  Unchanged subcentimeter foci of restricted diffusion consistent with acute nonhemorrhagic infarction, involving the brainstem and inferior cerebellum, RIGHT PICA territory. 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 Of note, a history of neck pain was elicited by the neurologist. Hypercoagulable state resulting in  thrombosis is also a consideration, and appropriate lab panel is pending. Except for the RIGHT vertebral occlusion, unremarkable appearing extracranial circulation.   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   Transthoracic Echocardiogram -Normal LV systolic and diastolic function; trace AI; mild TR   TEE postive for right to left shunt. No clot  LE venous doppler negative for DVt  TCD Bubble study positive for small right to left shunt  Protein C activity and S, antithrombin III, lupus anticoagulant negative  PENDING LABS: Factor 8 assay, Hexagonal Phase Phospholipid, Cardiolipin antibodies, Prothrombin gene, Factor 5 Leiden, beta 2 glycoprotein, Protein C total    HISTORY OF PRESENT ILLNESS  &  HOSPITAL COURSE [name] is a 41 y.o. male with history of anxiety, tobacco use, hyperlipidemia, and family history significant for blood clots presenting after recent discharge 04/10/17 for dizziness, gait imbalance, nausea, headache, and progressive of right-sided facial numbness. He returned with worsening of his deficits. He did not receive IV t-PA due to late presentation.   Stroke:  Acute Rt inferior cerebellar and Rt medullary infarcts felt 2/2 Rt distal VA  occlusion of cryptogenic etiology   Resultant  partial Wallenburg syndrome  CT head - normal head CT.   MRI head - Acute nonhemorrhagic subcentimeter Rt inferior cerebellar and Rt medullary infarcts.  MRA head - Rt vertebral V3 and V4 segment occlusions    CTA neck - Occlusion of the right vertebral artery at the level of the V3 segment.  Carotid Doppler - CTA neck  2D Echo - Normal LV systolic and diastolic function; trace AI; mild TRLDL - 57  TEE small PFO  LE venous dopplers negative  HgbA1c - 5.2  VTE prophylaxis - IV heparin  Diet  regular Room service appropriate? Yes; Fluid consistency: Thin  No antithrombotic prior to admission, now on heparin IV  Patient counseled to be compliant with his antithrombotic medications  Ongoing aggressive stroke risk factor management  Therapy recommendations:  CIR  Disposition:  CIR today  04/14/17: Discontinue IV heparin drip and change to aspirin and Plavix for 3 months followed by aspirin alone. Long discussion the patient, wife and father and Dr. Violeta Gelinas and Bensimhon and answered questions. The patient has a small PFO but in the absence of lower extremity venous clot this may be a innocent bystander. Will follow hypercoagulable panel labs and add factor VIII levels tomorrow after heparin is discontinued today. If etiology of stroke remains cryptogenic may consider elective PFO closure later as an outpatient. If patient is found to be hypercoagulable he may need long-term anticoagulation.  04/15/17: Neuro exam remainsstable. Right facial numbness slowly improving. Complaining of some mild nausea this morning and likely constipation. Plan of care reviewed with patient and his father at bedside. Plan for CIR in the morning. Review of 72  hrs of telemetry since admission has not detected AFIB so far.Long d/w patient and father and answered questions  04/16/2017: Neuro exam remains stable. Nystagmus improving. Walked with PT today but continues to c/o ataxia to the Right. C/o constipation despite Miralax/Senna yesterday. Encouraged to increase activity and change diet, Dulcolax suppository/fleets today. Patient verbalizes that he will f/u with Hematology at discharge.  Attending Note:  I had a long discussion the patient and his father regarding the stroke and plan for rehabilitation and answered questions  04/17/2017: Neuro exam stable. Continues to complain of ataxic gait. Medically stable and ready for discharge to CIR today. Patient agreed to participate in the [employer]-  embolic stroke of unknown source registry & signed consent form. He will be followed by the research team with 6 monthly phone calls  Constipation Dulcolax suppository 12/26 with good results Will continue to monitor  PFO may consider elective PFO closure later as an outpatient, if etiology of stroke remains cryptogenic ContinueASA and Plavix and statin for now Will need f/uat dischargewith Dr. Excell Seltzerooper to discuss PFO closure in future  Hypertension  Stable              Permissive hypertension (OK if < 220/120) but gradually normalize in 5-7 days              Long-term BP goal normotensive  Hyperlipidemia  Home meds:  Crestor 10 mg daily PTA  LDL 57, goal < 70  Crestor increased to 20 mg daily  Continue statin at discharge  Other Stroke Risk Factors  Former cigarette smoker - quit 14 months ago  ETOH use, advised to drink no more than 1 drink per day.  Family history of clotting disorder-Factor 8 excess may [facility]  Other Active Problems  Anxiety  DISCHARGE EXAM Blood pressure 117/79, pulse 77, temperature 98.6 F (37 C), temperature source Oral, resp. rate 18, height 5\' 10"  (1.778 m), weight 74.4 kg (164 lb), SpO2 97 %. Physical exam: Exam: Gen: NAD Eyes: anicteric sclerae, moist conjunctivae                    CV: no MRG, no carotid bruits, no peripheral edema Neuro:Mental Status: Alert, follows commands, good historian Speech: No aphasia, no dysarthria, Fluent speech Cranial Nerves: Right horner Syndrome.  EOMI.  Visual fields full. Right facial numbness. Face symmetric, Tongue midline. Hearing intact to voice. Shoulder shrug intact Motor Observation: no involuntary movements noted. Tone appears normal.  Strength: 5/5 Sensation:  Left arm>leg numbness Coordination : mild right finger to nose ataxia-improving Plantars downgoing.  Gait unsteady with ataxia  Discharge Diet  Diet regular  Room service appropriate? Yes; Fluid consistency: Thin liquids  DISCHARGE PLAN  Disposition:  [facility] PT, OT and ST  aspirin 325 mg daily, clopidogrel 75 mg daily  X 3 months followed by aspirin and alone and crestor 20 mg for secondary stroke prevention.  Recommend ongoing risk factor control by Primary Care Physician at time of discharge from inpatient rehabilitation.  Follow-up Martha ClanShaw, William, MD in 2 weeks following discharge from rehab.  Follow-up with Dr. Delia HeadyPramod Sethi, Stroke Clinic in 6 weeks, office to schedule an appointment.   Greater than 40 minutes were spent preparing discharge.  Beryl MeagerMary A Costello, ANP-C Neurologystroke team 04/17/2017   10:25 AM  I have personally examined this patient, reviewed notes, independently viewed imaging studies, participated in medical decision making  and plan of care.ROS completed by me personally and pertinent positives fully documented  I have made any additions or clarifications directly to the above note. Agree with note above.    Delia Heady, MD Medical Director [facility] St[facility] Pager: [phone] 04/17/2017 3:07 PM

## 2017-04-17 NOTE — Care Management Note (Signed)
Case Management Note  Patient Details  Name: Christen BameRobert F Lovin MRN: [id number] Date of Birth: 11/07/1975  Subjective/Objective:                    Action/Plan: Pt discharging to CIR today. No further needs per CM.  Expected Discharge Date:  04/17/17               Expected Discharge Plan:  IP Rehab Facility  In-House Referral:     Discharge planning Services  CM Consult  Post Acute Care Choice:    Choice offered to:     DME Arranged:    DME Agency:     HH Arranged:    HH Agency:     Status of Service:  Completed, signed off  If discussed at [employer] Meetings, dates discussed:    Additional Comments:  Kermit BaloKelli F Sincere Liuzzi, RN 04/17/2017, 11:54 AM

## 2017-04-17 NOTE — Progress Notes (Signed)
04/17/17 1700  PT Visit Information  Last PT Received On 04/17/17  Assistance Needed +1  History of Present Illness Jake Moore is a 41 y.o. male with medical history significant of anxiety , hld comes in with over a day of dizziness and worsening right sided facial numbness. 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  Precautions  Precautions Fall  Precaution Comments right side lean/list  Pain Assessment  Pain Assessment No/denies pain  Cognition  Arousal/Alertness Awake/alert  Behavior During Therapy WFL for tasks assessed/performed  Overall Cognitive Status Within Functional Limits for tasks assessed  Bed Mobility  Overal bed mobility Independent  General bed mobility comments cues for increased time between position changes  Transfers  Overall transfer level Needs assistance  Equipment used None  Transfers Sit to/from Stand  Sit to Stand Min assist  General transfer comment Min assist for stability  Ambulation/Gait  Ambulation/Gait assistance Min assist;Min guard  Ambulation Distance (Feet) 60 Feet (multiple trials of gaze target gait training)  Assistive device None  Gait Pattern/deviations Step-through pattern;Wide base of support;Staggering right  General Gait Details Multiple LOb with improved righting reactions and ability to self correct cues for alterred step length and increase gait speed  Gait velocity decreased  Gait velocity interpretation Below normal speed for age/gender  Modified Rankin (Stroke Patients Only)  Pre-Morbid Rankin Score 0  Modified Rankin 4  Balance  Overall balance assessment Needs assistance  Sitting-balance support Feet supported;No upper extremity supported  Sitting balance-Leahy Scale Good  Sitting balance - Comments Significantly decr tolerance of R lean/weight shift  Standing balance support No upper extremity  supported;During functional activity  Standing balance-Leahy Scale Fair  Standing balance comment static standing balance good, dynamic fair  PT - End of Session  Equipment Utilized During Treatment Gait belt  Activity Tolerance Patient tolerated treatment well  Patient left in bed;with call bell/phone within reach  Nurse Communication Mobility status  PT - Assessment/Plan  PT Plan Current plan remains appropriate  PT Visit Diagnosis Unsteadiness on feet (R26.81);Other abnormalities of gait and mobility (R26.89);Ataxic gait (R26.0);Other symptoms and signs involving the nervous system (R29.898);Dizziness and giddiness (R42)  PT Frequency (ACUTE ONLY) Min 4X/week  Follow Up Recommendations CIR  PT equipment Rolling walker with 5" wheels;3in1 (PT)  AM-PAC PT "6 Clicks" Daily Activity Outcome Measure  Difficulty turning over in bed (including adjusting bedclothes, sheets and blankets)? 4  Difficulty moving from lying on back to sitting on the side of the bed?  4  Difficulty sitting down on and standing up from a chair with arms (e.g., wheelchair, bedside commode, etc,.)? 1  Help needed moving to and from a bed to chair (including a wheelchair)? 3  Help needed walking in hospital room? 3  Help needed climbing 3-5 steps with a railing?  2  6 Click Score 17  Mobility G Code  CK  PT Goal Progression  Progress towards PT goals Progressing toward goals  Acute Rehab PT Goals  PT Goal Formulation With patient  Time For Goal Achievement 04/27/17  Potential to Achieve Goals Good  PT Time Calculation  PT Start Time (ACUTE ONLY) 1226  PT Stop Time (ACUTE ONLY) 1245  PT Time Calculation (min) (ACUTE ONLY) 19 min  PT General Charges  $$ ACUTE PT VISIT 1 Visit  PT Treatments  $Gait Training 8-22 mins    Jake Moore, PT DPT  Board Certified Neurologic Specialist [phone]

## 2017-04-17 NOTE — Progress Notes (Signed)
Retta Diones, RN  Rehab Admission Coordinator  Physical Medicine and Rehabilitation  PMR Pre-admission  Signed  Date of Service:  04/16/2017 2:39 PM       Related encounter: ED to Hosp-Admission (Current) from 04/11/2017 in [facility]      Signed            '[]'$ Hide copied text  '[]'$ Hover for details   PMR Admission Coordinator Pre-Admission Assessment  Patient: Jake Moore is an 41 y.o., male MRN: [id number] DOB: [birth year] Height: '5\' 10"'$  (177.8 cm) Weight: 74.4 kg (164 lb)                                                                                                                                             Insurance Information HMO:     PPO:       PCP:       IPA:       80/20:       OTHER:  Group # [id number] PRIMARY:  BCBS      Policy#: [id number]      Subscriber:  patient CM Name: Doroteo Glassman      Phone#: [phone]     Fax#: [phone] Pre-Cert#: [id number] X 7 days with update on 04/23/16 if not discharged      Employer: [employer] Benefits:  Phone #: [phone]     Name:  Lavell Islam. Date: 02/20/17     Deduct:  $3000 (met $3000)      Out of Pocket Max: $3000 (met $3000)      Life Max: N/A CIR: 100% w/auth      SNF: 100% w/auth Outpatient: 30 visit max     Co-Pay: none Home Health: 100% w/auth      Co-Pay: none DME: 100%     Co-Pay: none Providers: in network  Emergency [place]    Name Relation Home Work Mobile   Hord,Craig H Spouse [phone]       Current Medical History  Patient Admitting Diagnosis: Right inferior cerebellar and right medullary infarct   History of Present Illness: A 41 y.o.right handed malewith history of anxiety/ADD,, hyperlipidemia, quit smoking 14 months ago. Per chart reviewand patient,patient lives with spouse. Independent prior to admission. Works in Product [employer]. Two-level home with bedroom on main and 3 steps to entry.Wife can  assist as needed.Presented 04/11/2017 with dizziness, headache and right-sided facial numbness. 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 MRA/CT angiogram the neck with right vertebral artery right PICA occlusion. Patient did not receive TPA. Vascular surgery consulted for possible dissection that was ruled out. Echocardiogram with ejection fraction of 60% no wall motion abnormalities. Neurology consulted placed on intravenous heparinchanged to aspirin and Plavix. Venous Dopplers lower extremitiesnegative.  Hypercoag workupwith protein C and S, antithrombin III, lupus anticoagulant, anti-cardiolipin antibodies negative. TEEcompleted showing small PFO. Positive bubble study with Valsalva and advised to continue aspirin and Plavix therapy with follow-up cranial CT scan showing expected appearance of small right cerebellar infarct without hemorrhage no mass effect .Marland KitchenPhysicaland occupationaltherapy evaluationscompleted with recommendations of physical medicine rehabilitation consult.Patient to be admitted for a comprehensive inpatient rehabilitation program.  Total: 1=NIH  Past Medical History      Past Medical History:  Diagnosis Date  . Anxiety   . CVA (cerebral vascular accident) [facility])     Family History  family history includes CAD in his maternal grandfather; Other in his sister.  Prior Rehab/Hospitalizations: No previous rehab  Has the patient had major surgery during 100 days prior to admission? No  Current Medications   Current Facility-Administered Medications:  .  0.9 %  sodium chloride infusion, , Intravenous, Continuous, Garvin Fila, MD, Last Rate: 10 mL/hr at 04/14/17 1617 .  acetaminophen (TYLENOL) tablet 650 mg, 650 mg, Oral, Q4H PRN, 650 mg at 04/16/17 1800 **OR** acetaminophen (TYLENOL) solution 650 mg, 650 mg, Per Tube, Q4H PRN **OR**  acetaminophen (TYLENOL) suppository 650 mg, 650 mg, Rectal, Q4H PRN, Derrill Kay A, MD .  aspirin tablet 325 mg, 325 mg, Oral, Daily, Garvin Fila, MD, 325 mg at 04/17/17 0858 .  clopidogrel (PLAVIX) tablet 75 mg, 75 mg, Oral, Daily, Garvin Fila, MD, 75 mg at 04/17/17 0859 .  FLUoxetine (PROZAC) capsule 20 mg, 20 mg, Oral, Daily, Derrill Kay A, MD, 20 mg at 04/17/17 0859 .  gadobenate dimeglumine (MULTIHANCE) injection 15 mL, 15 mL, Intravenous, Once, Sarina Ill B, MD .  ibuprofen (ADVIL,MOTRIN) tablet 200 mg, 200 mg, Oral, Q6H PRN, Phillips Grout, MD, 200 mg at 04/15/17 1952 .  meclizine (ANTIVERT) tablet 25 mg, 25 mg, Oral, TID PRN, Rinehuls, David L, PA-C, 25 mg at 04/15/17 0939 .  multivitamin with minerals tablet 1 tablet, 1 tablet, Oral, Daily, Phillips Grout, MD, 1 tablet at 04/17/17 [phone] .  ondansetron (ZOFRAN) injection 4 mg, 4 mg, Intravenous, Q6H PRN, Phillips Grout, MD .  ondansetron [facility]) tablet 8 mg, 8 mg, Oral, Q8H PRN, Phillips Grout, MD, 8 mg at 04/15/17 0939 .  rosuvastatin (CRESTOR) tablet 20 mg, 20 mg, Oral, q1800, Derrill Kay A, MD, 20 mg at 04/16/17 1752 .  senna (SENOKOT) tablet 8.6 mg, 1 tablet, Oral, QHS, Costello, Mary A, NP .  sodium phosphate (FLEET) 7-19 GM/118ML enema 1 enema, 1 enema, Rectal, Once, Costello, Kayren Eaves, NP  Patients Current Diet: Diet regular Room service appropriate? Yes; Fluid consistency: Thin  Precautions / Restrictions Precautions Precautions: Fall Precaution Comments: right side lean/list Restrictions Weight Bearing Restrictions: No   Has the patient had 2 or more falls or a fall with injury in the past year?No.  Patient reports 0ne fall at the time of his recent stroke.  Prior Activity Level Community (5-7x/wk): Worked FT, was driving.  Home Assistive Devices / Equipment Home Assistive Devices/Equipment: None Home Equipment: Shower seat - built in  Prior Device Use: Indicate devices/aids used by the  patient prior to current illness, exacerbation or injury? None  Prior Functional Level Prior Function Level of Independence: Independent Comments: Working in [place]: Did the patient need help bathing, dressing, using the toilet or eating?  Independent  Indoor Mobility: Did the patient need assistance with walking from room to room (with or without device)? Independent  Stairs: Did  the patient need assistance with internal or external stairs (with or without device)? Independent  Functional Cognition: Did the patient need help planning regular tasks such as shopping or remembering to take medications? Independent  Current Functional Level Cognition  Arousal/Alertness: Awake/alert Overall Cognitive Status: Within Functional Limits for tasks assessed Orientation Level: Oriented X4 Attention: Focused, Sustained, Selective Focused Attention: Appears intact Sustained Attention: Appears intact Selective Attention: Appears intact Memory: Impaired Memory Impairment: Other (comment)(delayed recall 4/5) Awareness: Appears intact Problem Solving: Impaired Problem Solving Impairment: Verbal complex(4/5 serial 7s) Safety/Judgment: Appears intact    Extremity Assessment (includes Sensation/Coordination)  Upper Extremity Assessment: RUE deficits/detail, LUE deficits/detail RUE Deficits / Details: strength WFL. Decreased coordination possibly due to vestib and vision deficits LUE Deficits / Details: strength WFL. Decreased coordination possibly due to vestib and vision deficits  Lower Extremity Assessment: Defer to PT evaluation RLE Deficits / Details: Evidence of decr coordination, with wide stance and decr stance time in gait; Strength grossly 5/5 throughout LLE Deficits / Details: Evidence of decr coordination, with wide stance and decr stance time in gait; Strength grossly 5/5 throughout    ADLs  Overall ADL's : Needs  assistance/impaired Eating/Feeding: Set up, Supervision/ safety, Sitting Grooming: Min guard, Standing, Oral care Upper Body Bathing: Minimal assistance Lower Body Bathing: Sit to/from stand, +2 for safety/equipment, Moderate assistance Upper Body Dressing : Minimal assistance, Sitting Lower Body Dressing: Sit to/from stand, Minimal assistance Lower Body Dressing Details (indicate cue type and reason): for balance with sit to stand Toilet Transfer: Minimal assistance, Ambulation Functional mobility during ADLs: Minimal assistance General ADL Comments: Pt reports he taped his own glasses; no diplopia with taping.    Mobility  Overal bed mobility: Modified Independent Bed Mobility: Supine to Sit, Sit to Supine Supine to sit: Min guard Sit to supine: Min guard General bed mobility comments: Increased time due to dizziness    Transfers  Overall transfer level: Needs assistance Equipment used: None Transfers: Sit to/from Stand Sit to Stand: Min assist General transfer comment: for balance    Ambulation / Gait / Stairs / Wheelchair Mobility  Ambulation/Gait Ambulation/Gait assistance: Min assist, Mod assist Ambulation Distance (Feet): 200 Feet Assistive device: 1 person hand held assist Gait Pattern/deviations: Step-through pattern, Wide base of support, Staggering right General Gait Details: Frequent use of handrail. Mod assist to stabilize LOB x 2. Sway back posture during gait with increased weight shift over RLE. Increased instability noted with fatigue.  Gait velocity: decreased Gait velocity interpretation: Below normal speed for age/gender    Posture / Balance Dynamic Sitting Balance Sitting balance - Comments: Significantly decr tolerance of R lean/weight shift Balance Overall balance assessment: Needs assistance Sitting-balance support: Feet supported, No upper extremity supported Sitting balance-Leahy Scale: Good Sitting balance - Comments: Significantly decr  tolerance of R lean/weight shift Standing balance support: No upper extremity supported, During functional activity Standing balance-Leahy Scale: Fair Standing balance comment: static standing balance good, dynamic fair    Special needs/care consideration BiPAP/CPAP No CPM No Continuous Drip IV KVO Dialysis No       Life Vest No Oxygen no Special Bed no Trach Size No Wound Vac (area) No    Skin NO                           Bowel mgmt: Last BM 04/16/17 Bladder mgmt: Voiding in bathroom with assist Diabetic mgmt No    Previous Home Environment Living Arrangements: Spouse/significant other  Lives With:  Spouse Available Help at Discharge: Family, Available 24 hours/day Type of Home: House Home Layout: Two level, Able to live on main level with bedroom/bathroom Home Access: Stairs to enter Entrance Stairs-Rails: None Entrance Stairs-Number of Steps: 3 Bathroom Shower/Tub: Multimedia programmer: Standard Home Care Services: No Additional Comments: Lives with wife and three children  Discharge Living Setting Plans for Discharge Living Setting: Patient's home, House, Lives with (comment)(Lives with wife and 3 children ages 69, 31 and 30) Type of Home at Discharge: House Discharge Home Layout: Two level, Able to live on main level with bedroom/bathroom Alternate Level Stairs-Number of Steps: Flight Discharge Home Access: Stairs to enter Technical brewer of Steps: 4 Does the patient have any problems obtaining your medications?: No  Social/Family/Support Systems Patient Roles: Spouse, Parent(Has a wife and 3 children.) Contact Information: Mrs. Monette - wife - [phone] Anticipated Caregiver: wife Ability/Limitations of Caregiver: Wife does not work and can assist. Careers adviser: 24/7 Discharge Plan Discussed with Primary Caregiver: Yes Is Caregiver In Agreement with Plan?: Yes Does Caregiver/Family have Issues with Lodging/Transportation while  Pt is in Rehab?: No  Goals/Additional Needs Patient/Family Goal for Rehab: PT/OT mod I and supervision goals Expected length of stay: 7-10 days Cultural Considerations: None Dietary Needs: Regular diet, thin liquids Equipment Needs: TBD Pt/Family Agrees to Admission and willing to participate: Yes Program Orientation Provided & Reviewed with Pt/Caregiver Including Roles  & Responsibilities: Yes  Decrease burden of Care through IP rehab admission: N/A  Possible need for SNF placement upon discharge: Not anticipated  Patient Condition: This patient's medical and functional status has changed since the consult dated: 04/14/17 in which the Rehabilitation Physician determined and documented that the patient's condition is appropriate for intensive rehabilitative care in an inpatient rehabilitation facility. See "History of Present Illness" (above) for medical update. Functional changes are:  Currently requiring min to mod assist to ambulate 200 feet +1 HHA. Patient's medical and functional status update has been discussed with the Rehabilitation physician and patient remains appropriate for inpatient rehabilitation. Will admit to inpatient rehab today.  Preadmission Screen Completed By:  Retta Diones, 04/17/2017 9:57 AM ______________________________________________________________________   Discussed status with Dr. Posey Pronto on 04/17/17 at [phone] and received telephone approval for admission today.  Admission Coordinator:  Retta Diones, time 0959/Date 04/17/17             Cosigned by: Charlett Blake, MD at 04/17/2017 10:16 AM  Revision History

## 2017-04-17 NOTE — Progress Notes (Signed)
Marcello FennelPatel, Ankit Anil, MD  Physician  Physical Medicine and Rehabilitation  Consult Note  Addendum  Date of Service:  04/14/2017 5:49 AM       Related encounter: ED to Hosp-Admission (Current) from 04/11/2017 in [facility]      Expand All Collapse All       [] Hide copied text  [] Hover for details        Physical Medicine and Rehabilitation Consult Reason for Consult: Right side weakness Referring Physician: Dr. Pearlean BrownieSethi   HPI: Christen BameRobert F Thakur is a 41 y.o. right handed male with history of anxiety/ADD,, hyperlipidemia, quit smoking 14 months ago. Per chart review and patient, patient lives with spouse. Independent prior to admission. Works in Clinical research associatecommercial real estate. Two-level home with bedroom on main and 3 steps to entry. Wife can assist as needed. Presented 04/11/2017 with dizziness, headache and right-sided facial numbness. Cranial CT scan reviewed, unremarkable for acute process. 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 MRA/CT angiogram the neck with right vertebral artery right PICA occlusion. Patient did not receive TPA. Vascular surgery consulted for possible dissection that was ruled out. Echocardiogram with ejection fraction of 60% no wall motion abnormalities. Neurology consulted placed on intravenous heparin. Venous Dopplers lower extremities pending. Hypercoag workup ongoing. Plan for TEE. Code stroke called earlier today. Physical therapy evaluation completed with recommendations of physical medicine rehabilitation consult.   Review of Systems  Constitutional: Negative for chills and fever.  HENT: Negative for hearing loss.   Eyes: Positive for blurred vision and double vision.  Respiratory: Negative for cough and shortness of breath.   Cardiovascular: Negative for chest pain, palpitations and leg swelling.  Gastrointestinal: Positive for constipation. Negative for nausea and  vomiting.  Genitourinary: Negative for flank pain and hematuria.  Skin: Negative for rash.  Neurological: Positive for dizziness, sensory change and headaches. Negative for seizures.  Psychiatric/Behavioral:       Anxiety  All other systems reviewed and are negative.      Past Medical History:  Diagnosis Date  . Anxiety    No past surgical history of stroke. No pertinent past family history of premature CVA. Social History:  reports that he quit smoking about 14 months ago. His smoking use included cigarettes. He quit after 20.00 years of use. He has quit using smokeless tobacco. His smokeless tobacco use included chew. He reports that he drinks about 12.0 oz of alcohol per week. He reports that he does not use drugs. Allergies:       Allergies  Allergen Reactions  . Penicillins Hives    Has patient had a PCN reaction causing immediate rash, facial/tongue/throat swelling, SOB or lightheadedness with hypotension: YES Has patient had a PCN reaction causing severe rash involving mucus membranes or skin necrosis: NO Has patient had a PCN reaction that required hospitalizationNO Has patient had a PCN reaction occurring within the last 10 years: NO If all of the above answers are "NO", then may proceed with Cephalosporin use.         Medications Prior to Admission  Medication Sig Dispense Refill  . atomoxetine (STRATTERA) 10 MG capsule Take 10 mg by mouth daily.    Marland Kitchen. FLUoxetine (PROZAC) 20 MG tablet Take 20 mg by mouth daily.    Marland Kitchen. ibuprofen (ADVIL,MOTRIN) 200 MG tablet Take 200 mg by mouth every 6 (six) hours as needed for moderate pain.    . meclizine (ANTIVERT) 25 MG tablet Take 1 tablet (25 mg  total) by mouth 3 (three) times daily as needed for dizziness. 15 tablet 0  . Multiple Vitamin (MULTIVITAMIN) tablet Take 1 tablet by mouth daily.    . ondansetron (ZOFRAN) 8 MG tablet Take 1 tablet (8 mg total) by mouth every 8 (eight) hours as needed for nausea or vomiting. 10  tablet 0  . rosuvastatin (CRESTOR) 10 MG tablet Take 10 mg by mouth daily.    . naproxen (NAPROSYN) 500 MG tablet Take 1 tablet (500 mg total) by mouth 2 (two) times daily. (Patient not taking: Reported on 07/14/2016) 30 tablet 0    Home: Home Living Family/patient expects to be discharged to:: Private residence Living Arrangements: Spouse/significant other Available Help at Discharge: Family, Available 24 hours/day Type of Home: House Home Access: Stairs to enter Entergy CorporationEntrance Stairs-Number of Steps: 3 Entrance Stairs-Rails: None Home Layout: Two level, Able to live on main level with bedroom/bathroom Bathroom Shower/Tub: Health visitorWalk-in shower Bathroom Toilet: Standard Home Equipment: Shower seat - built in Additional Comments: Lives with wife and three children  Lives With: Spouse  Functional History: Prior Function Level of Independence: Independent 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 Functional Status:  Mobility: Bed Mobility Overal bed mobility: Needs Assistance Bed Mobility: Supine to Sit, Sit to Supine Supine to sit: Min guard Sit to supine: Min guard General bed mobility comments: Close guard for safety; Cues to self-monitor for activity tolerance; Reports sensation of falling to the R in sitting EOB; Practiced R elbow prop, which elicited a profound dizziness, and he impulsively laid back down to supine (toward the L) Transfers Overall transfer level: Needs assistance Equipment used: 2 person hand held assist Transfers: Sit to/from Stand Sit to Stand: Min assist General transfer comment: Adequate strength for power up; min assist to steady; wide stance/base of support Ambulation/Gait Ambulation/Gait assistance: Mod assist, +2 physical assistance, +2 safety/equipment Ambulation Distance (Feet): 75 Feet Assistive device: 2 person hand held assist Gait Pattern/deviations: Decreased step length - right, Decreased step length - left, Decreased stance time - right,  Decreased stance time - left, Decreased weight shift to right, Decreased weight shift to left, Wide base of support General Gait Details: Short steps with significantly wide stance for stability, and heavy dependence on bilateral handheld assist   ADL: ADL Overall ADL's : Needs assistance/impaired Eating/Feeding: Set up, Supervision/ safety, Sitting Grooming: Set up, Supervision/safety, Sitting Upper Body Bathing: Minimal assistance Lower Body Bathing: Sit to/from stand, +2 for safety/equipment, Moderate assistance Upper Body Dressing : Minimal assistance, Sitting Lower Body Dressing: Sit to/from stand, +2 for safety/equipment, Moderate assistance Lower Body Dressing Details (indicate cue type and reason): Pt able to pull up socks. Mod A for dynamic standing balance Toilet Transfer: Moderate assistance, +2 for physical assistance, Ambulation(Simulated to recliner; two person hand held A) Functional mobility during ADLs: Moderate assistance, +2 for physical assistance(Reported feeling of falling back and to the R) General ADL Comments: Focusing session on education on double vision and providing tapping as compensatory strategy for double vision. After placing tape on glasses, pt stating double vision was reduced and he was able to read text messages on his phone.  Cognition: Cognition Overall Cognitive Status: Impaired/Different from baseline Arousal/Alertness: Awake/alert Orientation Level: Oriented X4 Attention: Focused, Sustained, Selective Focused Attention: Appears intact Sustained Attention: Appears intact Selective Attention: Appears intact Memory: Impaired Memory Impairment: Other (comment)(delayed recall 4/5) Awareness: Appears intact Problem Solving: Impaired Problem Solving Impairment: Verbal complex(4/5 serial 7s) Safety/Judgment: Appears intact Cognition Arousal/Alertness: Awake/alert Behavior During Therapy: WFL for tasks assessed/performed Overall Cognitive  Status:  Impaired/Different from baseline  Blood pressure 125/86, pulse 74, temperature 97.7 F (36.5 C), temperature source Oral, resp. rate (!) 8, height 5\' 10"  (1.778 m), weight 74.8 kg (164 lb 14.5 oz), SpO2 98 %. Physical Exam  Vitals reviewed. Constitutional: He is oriented to person, place, and time. He appears well-developed and well-nourished.  HENT:  Head: Normocephalic and atraumatic.  Eyes: EOM are normal. Right eye exhibits no discharge. Left eye exhibits no discharge.  Neck: Normal range of motion. Neck supple. No thyromegaly present.  Cardiovascular: Normal rate, regular rhythm and normal heart sounds.  Respiratory: Effort normal and breath sounds normal. No respiratory distress.  GI: Soft. Bowel sounds are normal. He exhibits no distension.  Musculoskeletal: He exhibits no edema or tenderness.  Neurological: He is alert and oriented to person, place, and time.  Follows full commands Sensation diminished right face Mild ataxia LUE Motor: RUE/RLE: 4+/5 proximal to distal LUE/LLE: 5/5 proximal to distal  Skin: Skin is warm and dry.  Psychiatric: He has a normal mood and affect. His behavior is normal. Thought content normal.         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  Records reviewed and summated above. Stroke: Continue secondary stroke prophylaxis and Risk Factor Modification listed below:   Antiplatelet therapy:  Transition from heparin ggt to oral when appropriate Blood Pressure Management:  Continue current medication with prn's with permisive HTN per primary team Statin Agent:    1. Does the need for close, 24 hr/day medical supervision in concert with the patient's rehab needs make it unreasonable for this patient to be served in a less intensive setting? Yes  2. Co-Morbidities requiring supervision/potential complications: anxiety (ensure anxiety and resulting apprehension do  not limit functional progress; consider prn medications if warranted), ADD (monitor),  Hyperlipidemia (cont meds), hypokalemia (continue to monitor and replete as necessary) 3. Due to safety, disease management and patient education, does the patient require 24 hr/day rehab nursing? Yes 4. Does the patient require coordinated care of a physician, rehab nurse, PT (1-2 hrs/day, 5 days/week) and OT (1-2 hrs/day, 5 days/week) to address physical and functional deficits in the context of the above medical diagnosis(es)? Yes Addressing deficits in the following areas: balance, endurance, locomotion, strength, transferring, bathing, dressing, toileting and psychosocial support 5. Can the patient actively participate in an intensive therapy program of at least 3 hrs of therapy per day at least 5 days per week? Yes 6. The potential for patient to make measurable gains while on inpatient rehab is excellent 7. Anticipated functional outcomes upon discharge from inpatient rehab are modified independent and supervision  with PT, modified independent and supervision with OT, n/a with SLP. 8. Estimated rehab length of stay to reach the above functional goals is: 7-10 days. 9. Anticipated D/C setting: Home 10. Anticipated post D/C treatments: HH therapy and Home excercise program 11. Overall Rehab/Functional Prognosis: good  RECOMMENDATIONS: This patient's condition is appropriate for continued rehabilitative care in the following setting: CIR after completion of medical workup and medically stable. Patient has agreed to participate in recommended program. Yes Note that insurance prior authorization may be required for reimbursement for recommended care.  Comment: Rehab Admissions Coordinator to follow up.  Maryla Morrow, MD, ABPMR 04/13/17 Mcarthur Rossetti Angiulli, PA-C 04/14/2017      Revision History  Routing History

## 2017-04-18 ENCOUNTER — Inpatient Hospital Stay (HOSPITAL_COMMUNITY): Payer: BLUE CROSS/BLUE SHIELD | Admitting: Physical Therapy

## 2017-04-18 ENCOUNTER — Inpatient Hospital Stay (HOSPITAL_COMMUNITY): Payer: BLUE CROSS/BLUE SHIELD | Admitting: Occupational Therapy

## 2017-04-18 DIAGNOSIS — R740 Nonspecific elevation of levels of transaminase and lactic acid dehydrogenase [LDH]: Secondary | ICD-10-CM

## 2017-04-18 DIAGNOSIS — R7401 Elevation of levels of liver transaminase levels: Secondary | ICD-10-CM

## 2017-04-18 DIAGNOSIS — F411 Generalized anxiety disorder: Secondary | ICD-10-CM

## 2017-04-18 DIAGNOSIS — I63012 Cerebral infarction due to thrombosis of left vertebral artery: Secondary | ICD-10-CM

## 2017-04-18 LAB — PROTHROMBIN GENE MUTATION

## 2017-04-18 LAB — FACTOR 8 ASSAY: Coagulation Factor VIII: UNDETERMINED %

## 2017-04-18 LAB — FACTOR 5 LEIDEN

## 2017-04-18 NOTE — Evaluation (Signed)
Occupational Therapy Assessment and Plan  Patient Details  Name: Jake Moore MRN: [id number] Date of Birth: 1975/08/22  OT Diagnosis: ataxia, disturbance of vision and muscle weakness (generalized) Rehab Potential: Rehab Potential (ACUTE ONLY): Good ELOS: 7-10 days   Today's Date: 04/18/2017 OT Individual Time: 9983-3825 OT Individual Time Calculation (min): 75 min     Problem List:  Patient Active Problem List   Diagnosis Date Noted  . Generalized anxiety disorder   . Transaminitis   . CVA (cerebral vascular accident) ([place]) 04/17/2017  . 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 Altered sensation due to recent stroke   . Anxiety state   . Attention deficit disorder   . Hypokalemia   . Stroke, Wallenberg's syndrome   . Stroke ([place]) 04/11/2017  . Hyperlipidemia 04/11/2017  . 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 Anxiety     Past Medical History:  Past Medical History:  Diagnosis Date  . Anxiety   . CVA (cerebral vascular accident) [facility])    Past Surgical History:  Past Surgical History:  Procedure Laterality Date  . TEE WITHOUT CARDIOVERSION N/A 04/14/2017   Procedure: TRANSESOPHAGEAL ECHOCARDIOGRAM (TEE);  Surgeon: Jolaine Artist, MD;  Location: [facility] ENDOSCOPY;  Service: Cardiovascular;  Laterality: N/A;    Assessment & Plan Clinical Impression: Patient is a 41 y.o. year old male right handed male with history of anxiety/ADD, hyperlipidemia who was admitted on 04/11/17 with HA, dizziness and right facial numbness. 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 TEE done revealing small PFO with positive bubble study and BLE dopplers were negative for DVT. Repeat cranial CT scan done 12/23 due to fluctuating symptoms and showed no hemorrhage small right cerebellar infarct without hemorrhage. Dr. Leonie Man felt that stroke extracranial dissection at C1  level v/s hypercoagulable state (family history of Factor 8 excess) and he was started on IV heparin and transitioned to ASA/Plavix. He is to follow up with hematology after discharge. Patient currently requires min with basic self-care skills and min A for basic mobility but pt with increased fall risk secondary to muscle weakness, decreased cardiorespiratoy endurance, decreased visual motor skills, central origin and decreased sitting balance, decreased standing balance, decreased postural control and decreased balance strategies.  Prior to hospitalization, patient could complete ADL with independent .  Patient will benefit from skilled intervention to decrease level of assist with basic self-care skills and increase independence with basic self-care skills prior to discharge home with care partner.  Anticipate patient will require intermittent supervision and follow up outpatient.  OT - End of Session Activity Tolerance: Endurance does not limit participation in activity OT Assessment Rehab Potential (ACUTE ONLY): Good OT Patient demonstrates impairments in the following area(s): Balance;Safety;Sensory;Motor;Vision OT Basic ADL's Functional Problem(s): Grooming;Bathing;Dressing;Toileting OT Transfers Functional Problem(s): Toilet;Tub/Shower OT Additional Impairment(s): None OT Plan OT Intensity: Minimum of 1-2 x/day, 45 to 90 minutes OT Frequency: 5 out of 7 days OT Duration/Estimated Length of Stay: 7-10 days OT Treatment/Interventions: Balance/vestibular training;Discharge planning;Functional electrical stimulation;Pain management;Self Care/advanced ADL retraining;Therapeutic Activities;UE/LE Coordination activities;Disease mangement/prevention;Functional mobility training;Patient/family education;Therapeutic Exercise;Visual/perceptual remediation/compensation;Community reintegration;DME/adaptive equipment instruction;Neuromuscular re-education;Psychosocial support;UE/LE Strength  taining/ROM OT Self Feeding Anticipated Outcome(s): n/ OT Basic Self-Care Anticipated Outcome(s): mod I  OT Toileting Anticipated Outcome(s): mod I  OT Bathroom Transfers Anticipated Outcome(s): mod I  OT Recommendation Recommendations for Other Services: Neuropsych consult Patient destination: Home Follow Up Recommendations: Outpatient OT Equipment Recommended: To be determined  Skilled Therapeutic Intervention Ot eval initiated with Ot goals, purpose and role discussed. SElf care retraining at shower level with focus on dynamic standing balance, functional ambulation, dressing in standing position and safety with mobility. Pt reports diplopia in far gaze and in far gaze to the right. Pt also relies on wide BOS in ambulation and in standing to maintain balance. Pt with dizziness/ feelings of unsteadiness with head turns with ambulation. Doesn't report an dizziness in supine and with rolling. Pt performed 38/56 on BERG indicating a fall risk. Pt also perform limits of stability on Biodex with and without dynamic base with moderate to maximal difficulty but only requiring min guard for safety. Pt educated on diplopia exercises for homework in room. Pt left resting in recliner in room.   OT Evaluation Precautions/Restrictions  Precautions Precautions: Fall Precaution Comments: right side lean/list Restrictions Weight Bearing Restrictions: No General Chart Reviewed: Yes Family/Caregiver Present: No    Pain Pain Assessment Pain Assessment: No/denies pain Pain Score: 5  Pain Type: Acute pain Pain Location: Head Pain Orientation: Posterior Pain Descriptors / Indicators: Aching Pain Onset: On-going(every morning and every afternoon around 1700) Patients Stated Pain Goal: 0 Pain Intervention(s): Medication (See eMAR) Home Living/Prior [place] expects to be discharged to:: Private residence Living Arrangements: Spouse/significant other Available Help  at Discharge: Family, Available 24 hours/day Type of Home: House Home Access: Stairs to enter Technical brewer of Steps: 3 Entrance Stairs-Rails: None Home Layout: Two level, Able to live on main level with bedroom/bathroom Bathroom Shower/Tub: Multimedia programmer: Standard Additional Comments: Lives with wife and three children  Lives With: Spouse ADL ADL ADL Comments: see functional navigator Vision Baseline Vision/History: Wears glasses Wears Glasses: Reading only Patient Visual Report: Eye fatigue/eye pain/headache;Nausea/blurring vision with head movement;Diplopia Ocular Range of Motion: Within Functional Limits Tracking/Visual Pursuits: Able to track stimulus in all quads without difficulty Convergence: Impaired (comment) Visual Fields: No apparent deficits Diplopia Assessment: Disappears with one eye closed;Objects split side to side;Present in far gaze;Only with right gaze;Objects split on top of one another Depth Perception: Undershoots Additional Comments: poor depth perception Perception  Perception: Within Functional Limits Praxis Praxis: Intact Cognition Overall Cognitive Status: Within Functional Limits for tasks assessed Arousal/Alertness: Awake/alert Orientation Level: Person;Place;Situation Person: Oriented Place: Oriented Situation: Oriented Year: 2018 Month: December Day of Week: Correct Memory: Appears intact Immediate Memory Recall: Sock;Blue;Bed Memory Recall: Sock;Blue;Bed Memory Recall Sock: Without Cue Memory Recall Blue: Without Cue Memory Recall Bed: Without Cue Attention: Focused;Sustained;Selective Focused Attention: Appears intact Sustained Attention: Appears intact Selective Attention: Appears intact Awareness: Appears intact Problem Solving: Appears intact Safety/Judgment: Appears intact Sensation Sensation Light Touch: Impaired Detail Light Touch Impaired Details: Impaired RLE;Impaired RUE Hot/Cold: Impaired  Detail Hot/Cold Impaired Details: Impaired RLE;Impaired RUE Proprioception: Impaired Detail Proprioception Impaired Details: Impaired RLE Coordination Gross Motor Movements are Fluid and Coordinated: No Fine Motor Movements are Fluid and Coordinated: Yes Motor  Motor Motor: Ataxia;Abnormal postural alignment and control Mobility  Transfers Transfers: Sit to Stand;Stand to Sit Sit to Stand: 4: Min guard Stand to Sit: 4: Min guard  Trunk/Postural Assessment  Cervical Assessment Cervical Assessment: Within Functional Limits Thoracic Assessment Thoracic Assessment: Within Functional Limits Lumbar Assessment Lumbar Assessment: Within Functional Limits Postural Control Postural Control: Deficits on evaluation Trunk Control: biasis lean to the right  Righting Reactions: delayed Protective Responses: delayed  Balance Standardized Balance Assessment Standardized Balance Assessment: Berg Balance Test Berg Balance Test Sit to Stand: Able to stand without using hands and stabilize independently  Standing Unsupported: Able to stand safely 2 minutes Sitting with Back Unsupported but Feet Supported on Floor or Stool: Able to sit safely and securely 2 minutes Stand to Sit: Sits safely with minimal use of hands Transfers: Able to transfer safely, minor use of hands Standing Unsupported with Eyes Closed: Able to stand 10 seconds with supervision Standing Ubsupported with Feet Together: Able to place feet together independently but unable to hold for 30 seconds From Standing, Reach Forward with Outstretched Arm: Can reach forward >12 cm safely (5") From Standing Position, Pick up Object from Floor: Able to pick up shoe, needs supervision From Standing Position, Turn to Look Behind Over each Shoulder: Looks behind one side only/other side shows less weight shift Turn 360 Degrees: Able to turn 360 degrees safely but slowly Standing Unsupported, Alternately Place Feet on Step/Stool: Able to  complete 4 steps without aid or supervision Standing Unsupported, One Foot in Front: Loses balance while stepping or standing Standing on One Leg: Unable to try or needs assist to prevent fall Total Score: 38 Extremity/Trunk Assessment RUE Assessment RUE Assessment: Within Functional Limits LUE Assessment LUE Assessment: Within Functional Limits   See Function Navigator for Current Functional Status.   Refer to Care Plan for Long Term Goals  Recommendations for other services: Neuropsych   Discharge Criteria: Patient will be discharged from OT if patient refuses treatment 3 consecutive times without medical reason, if treatment goals not met, if there is a change in medical status, if patient makes no progress towards goals or if patient is discharged from hospital.  The above assessment, treatment plan, treatment alternatives and goals were discussed and mutually agreed upon: by patient  Nicoletta Ba 04/18/2017, 9:28 AM

## 2017-04-18 NOTE — Progress Notes (Signed)
Social Work Assessment and Plan Social Work Assessment and Plan  Patient Details  Name: Jake BameRobert F Moore MRN: [id number] Date of Birth: 09/05/1975  Today's Date: 04/18/2017  Problem List:  Patient Active Problem List   Diagnosis Date Noted  . Generalized anxiety disorder   . Transaminitis   . CVA (cerebral vascular accident) (HCC) 04/17/2017  . 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 Altered sensation due to recent stroke   . Anxiety state   . Attention deficit disorder   . Hypokalemia   . Stroke, Wallenberg's syndrome   . Stroke (HCC) 04/11/2017  . Hyperlipidemia 04/11/2017  . 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 Anxiety    Past Medical History:  Past Medical History:  Diagnosis Date  . Anxiety   . CVA (cerebral vascular accident) [facility](HCC)    Past Surgical History:  Past Surgical History:  Procedure Laterality Date  . TEE WITHOUT CARDIOVERSION N/A 04/14/2017   Procedure: TRANSESOPHAGEAL ECHOCARDIOGRAM (TEE);  Surgeon: Dolores PattyBensimhon, Daniel R, MD;  [facility];  Service: Cardiovascular;  Laterality: N/A;   Social History:  reports that he quit smoking about 15 months ago. His smoking use included cigarettes. He quit after 20.00 years of use. He has quit using smokeless tobacco. His smokeless tobacco use included chew. He reports that he drinks about 12.0 oz of alcohol per week. He reports that he does not use drugs.  Family / Support Systems Marital Status: Married Patient Roles: Spouse, Parent, Other (Comment)(employee) Spouse/Significant Other: Tasia CatchingsCraig [phone] Children: Three children 6,8,10 Other Supports: Extended family and friends Anticipated Caregiver: Wife Ability/Limitations of Caregiver: Wife is not employed and can provide care if needed Caregiver Availability: 24/7 Family Dynamics: Close knit family who are there for one another and will priovide assistance if needed. Pt is very grateful for all of the support he has and all of the  people in his life.  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 He is optimistic regarding his recovery and will do all he can to achieve these goals. His wife is very supportive and will do what is needed to assist him through this. Recent Psychosocial Issues: other health issues thought managed prior to this Pyschiatric History: history of anxiety takes medication which he feels is helpful and will continue. May benefit from seeing neuro-psuch while here if here long enough. He is fairly high level, will be a short length of stay. Substance Abuse History: Quit tobacco 15 months ago  Patient / Family Perceptions, Expectations & Goals Pt/Family understanding of illness & functional limitations: Pt and wife can explain his stroke and deficits, they talk with the MD and feel they have a good understanding and the plan from here. Pt is very involved in what he can do to prevent another one. Premorbid pt/family roles/activities: Husband, son, father, employee, friend, colleague, etc Anticipated changes in roles/activities/participation: resume Pt/family expectations/goals: Pt states: " I want to be as  good as I can be before I leave here."  Wife states: " I don't want him to go too soon just because he  can walk well, he needs to stay for all of the benefits."  [employer] Inc[employer] Resources Community Agencies: None Premorbid Home Care/DME Agencies: None Transportation available at discharge: Wife Resource referrals recommended: Neuropsychology, Support group (specify)  Discharge Planning Living Arrangements: Spouse/significant other, Children Support Systems: Spouse/significant other, Children, Other relatives, Friends/neighbors, Psychologist, clinicalChurch/faith community Type of Residence: Private residence Insurance Resources: Media plannerrivate Insurance (specify)(BCBS) Financial Resources: Employment Surveyor, quantityinancial Screen Referred: No Living Expenses: Database administratorMotgage Money Management: Patient, Spouse Does the patient have any problems obtaining your medications?: No Home Management: Wife does the home mangement Patient/Family Preliminary Plans: Return home with wife who is able to provide care if needed. Pt is wanting to get as independent as possible before going home. Team is evaluating today and setting goals. Asked for an vestibular eval-Pam-PA ordered one. Social Work Anticipated Follow Up Needs: HH/OP, Support Group  Clinical Impression Very pleasant couple, pt is wanting to be as independent as possible before going home and wife doesn't want him to go home too soon due to walking well. He is still having visual issues and balance along with headaches. Will await team's evalutions and work on discharge needs. Both are encouraged by the progress he has made already since coming into the hospital.  Lucy ChrisDupree, Thatiana Renbarger G 04/18/2017, 1:26 PM

## 2017-04-18 NOTE — Progress Notes (Signed)
Physical Therapy Assessment and Plan  Patient Details  Name: Jake Moore MRN: [id number] Date of Birth: 1975-09-10  PT Diagnosis: Abnormality of gait, Ataxia, Ataxic gait, Difficulty walking, Dizziness and giddiness and Impaired sensation Rehab Potential: Excellent ELOS: 7-10 days   Today's Date: 04/18/2017 PT Individual Time: 1093-2355 AND 7322-0254 PT Individual Time Calculation (min): 70 min AND 10 min  Problem List:  Patient Active Problem List   Diagnosis Date Noted  . Generalized anxiety disorder   . Transaminitis   . CVA (cerebral vascular accident) ([place]) 04/17/2017  . 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 Altered sensation due to recent stroke   . Anxiety state   . Attention deficit disorder   . Hypokalemia   . Stroke, Wallenberg's syndrome   . Stroke ([place]) 04/11/2017  . Hyperlipidemia 04/11/2017  . 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 Anxiety     Past Medical History:  Past Medical History:  Diagnosis Date  . Anxiety   . CVA (cerebral vascular accident) [facility])    Past Surgical History:  Past Surgical History:  Procedure Laterality Date  . TEE WITHOUT CARDIOVERSION N/A 04/14/2017   Procedure: TRANSESOPHAGEAL ECHOCARDIOGRAM (TEE);  Surgeon: Jolaine Artist, MD;  Location: [facility] ENDOSCOPY;  Service: Cardiovascular;  Laterality: N/A;    Assessment & Plan Clinical Impression: Patient is a 41 y.o.right handed malewith history of anxiety/ADD, hyperlipidemiawho was admitted on 04/11/17 with HA, dizziness and right facial numbness. 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 Repeat cranial CT scandone 12/23 due tofluctuatingsymptoms and showed no hemorrhagesmall right cerebellar infarct without hemorrhage.Dr. 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 He is to follow up with hematology after discharge. Therapy ongoing and patient with functional deficits due to diplopia, dizziness, intermittent nausea and balance deficits. Patient transferred to CIR on 04/17/2017 .   Patient currently requires min with mobility secondary to ataxia and decreased coordination, decreased visual acuity, decreased visual perceptual skills and decreased visual motor skills,  central origin and decreased standing balance, decreased postural control and decreased balance strategies.  Prior to hospitalization, patient was independent  with mobility and lived with Spouse in a House home.  Home access is 3Stairs to enter.  Patient will benefit from skilled PT intervention to maximize safe functional mobility, minimize fall risk and decrease caregiver burden for planned discharge home with 24 hour supervision.  Anticipate patient will benefit from follow up OP at discharge.  PT - End of Session Activity Tolerance: Tolerates 30+ min activity with multiple rests Endurance Deficit: Yes Endurance Deficit Description: mild decrease in endurance, fatigues more easily than before stroke per pt report PT Assessment Rehab Potential (ACUTE/IP ONLY): Excellent PT Barriers to Discharge: Home environment access/layout PT Patient demonstrates impairments in the following area(s): Balance;Endurance;Safety;Sensory;Motor PT Transfers Functional Problem(s): Bed Mobility;Bed to Chair;Car;Furniture;Floor PT Locomotion Functional Problem(s): Ambulation;Wheelchair Mobility;Stairs;Other (comment) PT Plan PT Intensity: Minimum of 1-2 x/day ,45 to 90 minutes PT Frequency: 5 out of 7 days PT Duration Estimated Length of Stay: 7-10 days PT Treatment/Interventions: Ambulation/gait training;Cognitive remediation/compensation;Discharge  planning;DME/adaptive equipment instruction;Functional mobility training;Pain management;Psychosocial support;Splinting/orthotics;Therapeutic Activities;UE/LE Strength taining/ROM;Visual/perceptual remediation/compensation;Wheelchair propulsion/positioning;UE/LE Coordination activities;Therapeutic Exercise;Stair training;Skin care/wound management;Neuromuscular re-education;Patient/family education;Functional electrical stimulation;Disease management/prevention;Community reintegration;Balance/vestibular training PT Transfers Anticipated Outcome(s):  Mod I  PT Locomotion Anticipated Outcome(s): Mod I  PT Recommendation Recommendations for Other Services: Vestibular eval Follow Up Recommendations: Outpatient PT Patient destination: Home Equipment Recommended: To be determined  Skilled Therapeutic Intervention  Session 1:  Pt sitting EOB and agreeable to therapy, no c/o pain. Performed functional mobility and balance testing as outlined in evaluation below. Additionally performed dynamic standing balance tasks on foam surface w/ emphasis on horizontal head turns w/ bilateral reaching tasks, min guard for safety and mild increase in dizziness. Pt ambulated around unit w/ min assist for balance w/o AD and >1000' during 1 bout. Returned to room and educated wife on ambulating w/ patient in room, to/from toilet, and w/ transfers. Wife is safe to assist pt w/ these tasks, pt verbalized understanding that he needs to call for assistance when wife is not present. Ended session sitting in recliner, call bell within reach and all needs met.   Session 2:  Pt sitting edge of bed, agreeable to therapy, no c/o pain. Instructed and performed gaze stabilization exercises pt can perform outside of therapy. Stabilization exercises included horizontal and vertical head turns and horizontal saccades. Pt demo-ed correctly w/ mild increase in dizziness. Cautioned pt w/ increase in dizziness w/ exercises and performing when he  is supported by chair. Ended session sitting at edge of bed, call bell within reach and all needs met.   PT Evaluation Precautions/Restrictions Precautions Precautions: Fall Precaution Comments: pt reports increased fall/lean tendency to R side Restrictions Weight Bearing Restrictions: No Pain Pain Assessment Pain Assessment: No/denies pain Home Living/Prior Functioning Home Living Available Help at Discharge: Family;Available 24 hours/day Type of Home: House Home Access: Stairs to enter [employer] of Steps: 3 Entrance Stairs-Rails: None Home Layout: Two level;Able to live on main level with bedroom/bathroom Bathroom Shower/Tub: Multimedia programmer: Standard Additional Comments: Lives with wife and three children  Lives With: Spouse Prior Function Level of Independence: Independent with basic ADLs;Independent with homemaking with ambulation;Independent with gait;Independent with transfers  Able to Take Stairs?: Yes Driving: Yes Vocation: Full time employment Vocation Requirements: Works as Therapist, sports, drives often for job  Leisure: Hobbies-yes (Comment) Comments: Exercises frequently - treadmill, elliptical, and weight-lifting. Goes to visit trainer once a week for 10 years.  Vision/Perception  Vision - Assessment Ocular Range of Motion: Within Functional Limits Tracking/Visual Pursuits: Able to track stimulus in all quads without difficulty Convergence: Impaired (comment) Diplopia Assessment: Disappears with one eye closed;Objects split side to side;Present in far gaze;Only with right gaze;Objects split on top of one another Additional Comments: poor depth perception Perception Perception: Within Functional Limits Praxis Praxis: Intact  Cognition Overall Cognitive Status: Within Functional Limits for tasks assessed Arousal/Alertness: Awake/alert Orientation Level: Oriented X4 Attention: Focused;Sustained;Selective Focused  Attention: Appears intact Sustained Attention: Appears intact Selective Attention: Appears intact Memory: Appears intact Awareness: Appears intact Problem Solving: Appears intact Safety/Judgment: Appears intact Sensation Sensation Light Touch: Impaired by gross assessment(diminished sensation LLE, reports tingling) Light Touch Impaired Details: Impaired RLE;Impaired RUE Hot/Cold: Impaired by gross assessment(pt reports impaired hot/cold sensation on R side of body) Hot/Cold Impaired Details: Impaired RLE;Impaired RUE Proprioception: Impaired by gross assessment Proprioception Impaired Details: Impaired RLE Coordination Gross Motor Movements are Fluid and Coordinated: No Fine Motor Movements are Fluid and Coordinated: Yes Motor  Motor Motor: Ataxia;Abnormal postural alignment and control  Mobility Bed Mobility Bed Mobility: Rolling Right;Rolling Left;Supine to Sit;Sit to Supine Rolling Right: 5: Supervision Rolling Left: 5: Supervision Supine to Sit: 5: Supervision Sit to  Supine: 5: Supervision Transfers Transfers: Yes Sit to Stand: 4: Min guard Stand to Sit: 4: Min guard Stand Pivot Transfers: 4: Min guard Locomotion  Ambulation Ambulation: Yes Ambulation/Gait Assistance: 4: Min guard Ambulation Distance (Feet): 1000 Feet Assistive device: None Gait Gait: Yes Gait Pattern: Impaired Gait Pattern: Wide base of support;Shuffle;Ataxic Gait velocity: decreased High Level Ambulation High Level Ambulation: Sudden stops;Head turns Sudden Stops: LOB w/ sudden stops, min assist to correct Head Turns: LOB w/ horizontal head turns, min assist to correct Stairs / Additional Locomotion Stairs: Yes Stairs Assistance: 4: Min guard Stair Management Technique: One rail Right Number of Stairs: 12 Height of Stairs: 6 Ramp: 4: Min assist Curb: 4: Min Administrator Mobility: No  Trunk/Postural Assessment  Cervical Assessment Cervical Assessment: Within  Functional Limits Thoracic Assessment Thoracic Assessment: Within Functional Limits Lumbar Assessment Lumbar Assessment: Within Functional Limits Postural Control Postural Control: Deficits on evaluation Trunk Control: biasis lean to the right  Righting Reactions: delayed Protective Responses: delayed  Balance Balance Balance Assessed: Yes Standardized Balance Assessment Standardized Balance Assessment: Dynamic Gait Index Berg Balance Test Sit to Stand: Able to stand without using hands and stabilize independently Standing Unsupported: Able to stand safely 2 minutes Sitting with Back Unsupported but Feet Supported on Floor or Stool: Able to sit safely and securely 2 minutes Stand to Sit: Sits safely with minimal use of hands Transfers: Able to transfer safely, minor use of hands Standing Unsupported with Eyes Closed: Able to stand 10 seconds with supervision Standing Ubsupported with Feet Together: Able to place feet together independently but unable to hold for 30 seconds From Standing, Reach Forward with Outstretched Arm: Can reach forward >12 cm safely (5") From Standing Position, Pick up Object from Floor: Able to pick up shoe, needs supervision From Standing Position, Turn to Look Behind Over each Shoulder: Looks behind one side only/other side shows less weight shift Turn 360 Degrees: Able to turn 360 degrees safely but slowly Standing Unsupported, Alternately Place Feet on Step/Stool: Able to complete 4 steps without aid or supervision Standing Unsupported, One Foot in Front: Loses balance while stepping or standing Standing on One Leg: Unable to try or needs assist to prevent fall Total Score: 38 Dynamic Gait Index Level Surface: Mild Impairment Change in Gait Speed: Mild Impairment Gait with Horizontal Head Turns: Moderate Impairment Gait with Vertical Head Turns: Mild Impairment Gait and Pivot Turn: Moderate Impairment Step Over Obstacle: Mild Impairment Step Around  Obstacles: Mild Impairment Steps: Mild Impairment Total Score: 14 Extremity Assessment  RUE Assessment RUE Assessment: Within Functional Limits LUE Assessment LUE Assessment: Within Functional Limits RLE Assessment RLE Assessment: Within Functional Limits LLE Assessment LLE Assessment: Within Functional Limits   See Function Navigator for Current Functional Status.   Refer to Care Plan for Long Term Goals  Recommendations for other services: Other: vestibular eval  Discharge Criteria: Patient will be discharged from PT if patient refuses treatment 3 consecutive times without medical reason, if treatment goals not met, if there is a change in medical status, if patient makes no progress towards goals or if patient is discharged from hospital.  The above assessment, treatment plan, treatment alternatives and goals were discussed and mutually agreed upon: by patient and by family  Kassidi Elza K Arnette 04/18/2017, 1:01 PM

## 2017-04-18 NOTE — Plan of Care (Signed)
  Progressing Consults RH STROKE PATIENT EDUCATION Description See Patient Education module for education specifics  04/18/2017 [phone] - Progressing by Dani Gobbleeardon, Pearse Shiffler J, RN RH BOWEL ELIMINATION RH STG MANAGE BOWEL WITH ASSISTANCE Description STG Manage Bowel with Mod I Assistance.  04/18/2017 [id number] - Progressing by Dani Gobbleeardon, Barrie Wale J, RN RH SAFETY RH STG ADHERE TO SAFETY PRECAUTIONS W/ASSISTANCE/DEVICE Description STG Adhere to Safety Precautions With Mod I Assistance/Device.  04/18/2017 [id number] - Progressing by Dani Gobbleeardon, Anthany Thornhill J, RN RH COGNITION-NURSING RH STG ANTICIPATES NEEDS/CALLS FOR ASSIST W/ASSIST/CUES Description STG Anticipates Needs/Calls for Assist With Mod I  Assistance/Cues.  04/18/2017 [id number] - Progressing by Dani Gobbleeardon, Everette Dimauro J, RN RH PAIN MANAGEMENT RH STG PAIN MANAGED AT OR BELOW PT'S PAIN GOAL Description < 2 out of 10   04/18/2017 [id number] - Progressing by Dani Gobbleeardon, Shatima Zalar J, RN

## 2017-04-18 NOTE — Progress Notes (Signed)
Occupational Therapy Session Note  Patient Details  Name: Jake BameRobert F Moore MRN: [id number] Date of Birth: 10/06/1975  Today's Date: 04/18/2017 OT Individual Time: 1535-1605 OT Individual Time Calculation (min): 30 min    Short Term Goals: Week 1:  OT Short Term Goal 1 (Week 1): LTG=STG  Skilled Therapeutic Interventions/Progress Updates:    Pt worked on head/eye movements and positional changes during session.  Pt reports dizziness level at around a 2/10 with sitting statically at the EOB.  In supine he also reported dizziness at 2/10.  With rolling to the right side of the bed he reported an increase at 4/10 and rolling to the left at a 5/10.  With transition from supine to sit EOB he reported dizziness 4-5/10.  Had pt complete sit to stand transitions at the same level of dizziness.  Pt completed X1 gaze stabilization exercises in sitting and in standing with head movements left to right and up and down.  Pt with slower rate of speed but could keep both eyes on the target during the movement.  Dizziness did not increase above a 5.  Finished session with functional mobility in the hallway with min guard assist overall.  When incorporating head movements with mobility, pt reported increase up to 6/10 with dizziness.  Min assist was needed at times secondary to greater increase in LOB and delayed reactions.  Throughout standing and with mobility pt reports a sensation of being pulled to the right side and posterior.  Finished session with pt left in bedside recliner waiting for PT to come in for next session.    Therapy Documentation Precautions:  Precautions Precautions: Fall Precaution Comments: pt reports increased fall/lean tendency to R side Restrictions Weight Bearing Restrictions: No  Pain: Pain Assessment Pain Assessment: No/denies pain ADL: See Function Navigator for Current Functional Status.   Therapy/Group: Individual Therapy  Rosanna Bickle OTR/L 04/18/2017, 5:12 PM

## 2017-04-18 NOTE — Progress Notes (Signed)
[facility]     PROGRESS NOTE  Subjective/Complaints:  Pt seen sitting up in bed this AM.  He slept well overnight.  He is ready to begin therapies.    ROS: Denies CP, SOB, N/V/D.  Objective: Vital Signs: Blood pressure 118/85, pulse 79, temperature 98.7 F (37.1 C), temperature source Tympanic, resp. rate 16, height 5\' 10"  (1.778 m), SpO2 97 %. No results found. Recent Labs    04/17/17 1545  WBC 5.8  HGB 14.5  HCT 42.3  PLT 165   Recent Labs    04/17/17 1545  NA 140  K 4.4  CL 103  GLUCOSE 90  BUN 12  CREATININE 1.02  CALCIUM 9.2   CBG (last 3)  No results for input(s): GLUCAP in the last 72 hours.  Wt Readings from Last 3 Encounters:  04/14/17 74.4 kg (164 lb)  07/14/16 74.8 kg (165 lb)  03/19/14 72.6 kg (160 lb)    Physical Exam:  BP 118/85 (BP Location: Left Arm)   Pulse 79   Temp 98.7 F (37.1 C) (Tympanic)   Resp 16   Ht 5\' 10"  (1.778 m)   SpO2 97%   BMI 23.53 kg/m  Constitutional: He appearswell-developedand well-nourished.No distress.  HENT: Normocephalicand atraumatic.  Eyes:EOMare normal. No discharge. Cardiovascular:Normal rateand regular rhythm. No JVD. Respiratory:Effort normaland breath sounds normal.  ZO:XWRUEGI:Bowel sounds are normal. He exhibitsno distension.  Musculoskeletal: He exhibits noedemaor tenderness.  Neurological: He isalertand oriented. Speech clear.  Able to follow commands without difficulty.  Motor: 5/5 throughout No ataxia B/l UE Reduced right facial sensation to touch Skin: Skin iswarmand dry. He isnot diaphoretic.  Psychiatric: He has anormal mood and affect. Hisbehavior is normal.Judgmentand thought contentnormal.   Assessment/Plan: 1. 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 Physiatrist is providing close team supervision and 24 hour management of  active medical problems listed below. Physiatrist and rehab team continue to assess barriers to discharge/monitor patient progress toward functional and medical goals.  Function:  Bathing Bathing position      Bathing parts      Bathing assist        Upper Body Dressing/Undressing Upper body dressing                    Upper body assist        Lower Body Dressing/Undressing Lower body dressing                                  Lower body assist        Toileting Toileting          Toileting assist     Transfers Chair/bed transfer             Locomotion Ambulation           Wheelchair          Cognition Comprehension    Expression    Social Interaction    Problem Solving    Memory      Medical Problem List and Plan: 1. 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 DVT Prophylaxis/Anticoagulation: Pharmaceutical:Lovenox 3. Pain Management:tylenol prn 4. Mood:LCSW to follow for evaluation and support. 5. Neuropsych: This patientiscapable of making decisions on hisown behalf. 6. Skin/Wound Care:routine pressure relief measures 7. Fluids/Electrolytes/Nutrition:Monitor I/O.  BMP within acceptable range on 12/27 8. Constipation: Augment bowel regimen 9. Small PFO:To follow with Dr. Excell Seltzerooper on outpatient basis--may need closure ifsource crytogenic.  10. Anxiety disorder: continue Prozac. 11. Transaminitis   LFTS elevated on 12/27   Cont to monitor, may need to change statin  LOS (Days) 1 A FACE TO FACE EVALUATION WAS PERFORMED  Malary Aylesworth Karis Jubanil Winifred Bodiford 04/18/2017 8:49 AM

## 2017-04-18 NOTE — Care Management Note (Signed)
[facility] Individual Statement of Services  Patient Name:  Jake BameRobert F Moore  Date:  04/18/2017  Welcome to the [facility].  Our goal is to provide you with an individualized program based on your diagnosis and situation, designed to meet your specific needs.  With this comprehensive rehabilitation program, you will be expected to participate in at least 3 hours of rehabilitation therapies Monday-Friday, with modified therapy programming on the weekends.  Your rehabilitation program will include the following services:  Physical Therapy (PT), Occupational Therapy (OT), 24 hour per day rehabilitation nursing, Neuropsychology, Case Management (Social Worker), Rehabilitation Medicine, Nutrition Services and Pharmacy Services  Weekly team conferences will be held on Wednesday to discuss your progress.  Your Social Worker will talk with you frequently to get your input and to update you on team discussions.  Team conferences with you and your family in attendance may also be held.  Expected length of stay: 7-10 days  Overall anticipated outcome: MOD/I LEVEL  Depending on your progress and recovery, your program may change. Your Social Worker will coordinate services and will keep you informed of any changes. Your Social Worker's name and contact numbers are listed  below.  The following services may also be recommended but are not provided by the [facility]:   Driving Evaluations  Home Health Rehabiltiation Services  Outpatient Rehabilitation Services  Vocational Rehabilitation   Arrangements will be made to provide these services after discharge if needed.  Arrangements include referral to agencies that provide these services.  Your insurance has been verified to be:  [employer] Your primary doctor is:  Martha ClanWilliam Shaw  Pertinent information will be shared with your doctor and your insurance company.  Social Worker:  Dossie DerBecky Iisha Soyars, SW  [phone] or ([phone]) [phone]  Information discussed with and copy given to patient by: Lucy Chrisupree, [name], 04/18/2017, 1:28 PM

## 2017-04-19 ENCOUNTER — Inpatient Hospital Stay (HOSPITAL_COMMUNITY): Payer: BLUE CROSS/BLUE SHIELD | Admitting: Occupational Therapy

## 2017-04-19 ENCOUNTER — Inpatient Hospital Stay (HOSPITAL_COMMUNITY): Payer: BLUE CROSS/BLUE SHIELD | Admitting: Physical Therapy

## 2017-04-19 DIAGNOSIS — I6302 Cerebral infarction due to thrombosis of basilar artery: Secondary | ICD-10-CM

## 2017-04-19 NOTE — Plan of Care (Signed)
[facility] COGNITION-NURSING [facility] ANTICIPATES NEEDS/CALLS FOR ASSIST W/ASSIST/CUES Description STG Anticipates Needs/Calls for Assist With Mod I  Assistance/Cues.  04/19/2017 1225 - Not Applicable by Fabian SharpWhisner, Evonda Enge, RN  Pt A&O x4 with no cognitive deficits.

## 2017-04-19 NOTE — Progress Notes (Signed)
[facility] MEDICINE & REHABILITATION     PROGRESS NOTE  Subjective/Complaints:  Patient sitting up in bed.  He is eating this morning.  He really has no complaints other than a mild posterior headache.  He states he has this headache every morning since his stroke.  He has no other complaints.  ROS: Denies CP, SOB, N/V/D.  Objective: Vital Signs: Blood pressure 110/68, pulse 80, temperature 97.8 F (36.6 C), temperature source Oral, resp. rate 18, height 5\' 10"  (1.778 m), weight 168 lb (76.2 kg), SpO2 98 %.  Well-developed well-nourished young male in no acute distress.  HEENT exam: Atraumatic, normocephalic, extra ocular muscles are intact.  I do not see any nystagmus. Neck is supple. Chest is clear to auscultation without increased work of breathing. Cardiac exam S1 and S2 are regular.  Abdominal exam active bowel sounds, soft, nontender.  Extremities no edema.  Assessment/Plan: 1. 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 2. DVT Prophylaxis/Anticoagulation: Pharmaceutical:Lovenox 3. Pain Management:Headache is reasonably well controlled with Tylenol.  Continue the same. 4. Mood:LCSW to follow for evaluation and support. 5. Neuropsych: This patientiscapable of making decisions on hisown behalf. 6. Skin/Wound Care:routine pressure relief measures 7. Fluids/Electrolytes/Nutrition:Monitor I/O.     Basic Metabolic Panel:    Component Value Date/Time   NA 140 04/17/2017 1545   K 4.4 04/17/2017 1545   CL 103 04/17/2017 1545   CO2 27 04/17/2017 1545   BUN 12 04/17/2017 1545   CREATININE 1.02 04/17/2017 1545   GLUCOSE 90 04/17/2017 1545   CALCIUM 9.2 04/17/2017 1545    8. Constipation: Augment bowel regimen 9. Small PFO: To follow-up with Dr. Excell Seltzerooper on an outpatient basis.  This may be the source of a cryptogenic  thrombus. .10. Anxiety disorder: continue Prozac. 11. Transaminitis   LFTs were normal on December 21.  Increase significantly by 1227.  Will recheck on December 31.  LOS (Days) 2 A FACE TO FACE EVALUATION WAS PERFORMED  Shallen Luedke H Tru Leopard 04/19/2017 8:28 AM

## 2017-04-19 NOTE — Progress Notes (Signed)
Occupational Therapy Session Note  Patient Details  Name: Jake BameRobert F Narez MRN: [id number] Date of Birth: 04/12/1976  Today's Date: 04/19/2017 OT Individual Time: 0852-1000 and 1500-1530 OT Individual Time Calculation (min): 68 min and 30 min    Short Term Goals: Week 1:  OT Short Term Goal 1 (Week 1): LTG=STG  Skilled Therapeutic Interventions/Progress Updates:    Session 1: Upon entering the room, pt supine in bed with c/o headache but reports taking medication prior to OT arrival. Pt requesting to shower this session and ambulates in room without use of device to obtain all needed items with min verbal cues for safety awareness. Pt standing in shower with use of grab bar and showering with intermittent supervision for safety. Pt report having walk in shower at home with grab bar similiarly placed. Pt donning clothing items with sit <>stand from EOB with close supervision for safety. Pt standing at sink for all grooming tasks without LOB. OT educated pt on current OT progress and secondary stroke risk. Pt verbalized understanding and asking questions as appropriate. Pt ambulating 500'+ to main entrance with overall supervision and ambulating outside on various surfaces including uneven grass and curbing without LOB . Pt returning to room at end of session with call bell and all needed items within reach upon exiting the room.    Session 2: Upon entering the room, pt sleeping in bed but agreeable to OT intervention. Pt ambulating without use of device with supervision for safety [facility]. Pt engaged in dynavision task of visually scanning board and able to hit 222 targets within 3 minutes with average reaction time of 0.81 seconds. Pt standing on foam piece to increase standing balance challenge. Pt with no LOB during this session but does rely more heavily on R LE for balance with foam. Pt returning to room in same manner as above. Pt seated in recliner chair with call bell and all needed  items within reach upon exiting the room.   Therapy Documentation Precautions:  Precautions Precautions: Fall Precaution Comments: pt reports increased fall/lean tendency to R side Restrictions Weight Bearing Restrictions: No General:   Vital Signs:   Pain:   ADL: ADL ADL Comments: see functional navigator Vision   Perception    Praxis   Exercises:   Other Treatments:    See Function Navigator for Current Functional Status.   Therapy/Group: Individual Therapy  Alen BleacherBradsher, Tadeusz Stahl P 04/19/2017, 12:52 PM

## 2017-04-19 NOTE — Plan of Care (Signed)
Tylenol for headache with relief Continent of bladder

## 2017-04-19 NOTE — Progress Notes (Signed)
Physical Therapy Session Note  Patient Details  Name: Jake Moore MRN: [id number] Date of Birth: June 14, 1975  Today's Date: 04/19/2017 PT Individual Time: 1100-1155 AND 1300-1341 PT Individual Time Calculation (min): 55 min AND 41 min  Short Term Goals: Week 1:  PT Short Term Goal 1 (Week 1): =LTGs due to ELOS  Skilled Therapeutic Interventions/Progress Updates:   Session 1:  Pt sitting EOB upon arrival and agreeable to therapy. Reports having headache earlier in morning but it has since resolved. Worked on dynamic balance and gait this session. Ambulated in 150-300' bouts w/ close supervision while performing tasks. Focused on decreasing BOS and utilizing stepping strategies w/ LOB. Performed tandem walking w/ UE support on rail in hallway, ambulating w/ focus on keeping feet placement in width of 1 tile on floor, kicking ball, tossing ball, and carrying objects in UEs. Verbal cues for decreasing speed w/ gait for safety and for stepping strategies. Discussed stroke risk factors w/ wife and pt and maintaining healthy and active lifestyle at discharge. Both appreciative of education. Returned to room and ended session sitting in recliner and in care of wife, all needs met.   Session 2: Pt supine upon arrival and agreeable to therapy, no c/o pain. Ambulated to therapy gym w/ supervision. Worked on dynamic standing balance this session while playing Wii w/ emphasis on maintaining balance while making quick turns of both trunk and head. No increase in dizziness w/ turns and playing game. Close supervision for safety, however pt demonstrated stepping strategies to maintain balance w/o physical assistance. Ambulated 500'+ in hallway while working on maintaining more narrow BOS and making either quick horizontal or vertical head turns. No increase in dizziness. Returned to room and ended session in recliner, call bell within reach and all needs met.   Therapy Documentation Precautions:   Precautions Precautions: Fall Precaution Comments: pt reports increased fall/lean tendency to R side Restrictions Weight Bearing Restrictions: No Pain: Pain Assessment Pain Score: 5   See Function Navigator for Current Functional Status.   Therapy/Group: Individual Therapy  [name] 04/19/2017, 12:13 PM

## 2017-04-19 NOTE — Plan of Care (Signed)
Taking tylenol for headache with relief Continent of b/b

## 2017-04-20 ENCOUNTER — Inpatient Hospital Stay (HOSPITAL_COMMUNITY): Payer: BLUE CROSS/BLUE SHIELD | Admitting: Occupational Therapy

## 2017-04-20 ENCOUNTER — Inpatient Hospital Stay (HOSPITAL_COMMUNITY): Payer: BLUE CROSS/BLUE SHIELD

## 2017-04-20 NOTE — Progress Notes (Signed)
[facility] PHYSICAL MEDICINE & REHABILITATION     PROGRESS NOTE  Subjective/Complaints:  Patient continues to have intermittent headache resolved with Tylenol- headaches much better this morning. He tells me that right ear is "popping today".  He has no other complaints.  Reviewed his therapy sessions yesterday.  It appears that he is doing quite well with therapy.  Objective: Vital Signs: Blood pressure 118/73, pulse 75, temperature 98.5 F (36.9 C), temperature source Oral, resp. rate 17, height 5\' 10"  (1.778 m), weight 168 lb (76.2 kg), SpO2 99 %.   Well-developed well-nourished young male in no acute distress.  He appears well.  HEENT exam atraumatic, normocephalic, extra ocular muscles are intact.  Nystagmus is not present.  Neck is supple. Chest is clear to auscultation Cardiac exam S1 and S2 are regular Abdominal exam with active bowel sounds, soft, nontender Extremities without any edema.  Assessment/Plan: 1. 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 2. DVT Prophylaxis/Anticoagulation: Pharmaceutical:Lovenox 3. Pain Management:We will continue with Tylenol. 4. Mood:LCSW to follow for evaluation and support. 5. Neuropsych: This patientiscapable of making decisions on hisown behalf. 6. Skin/Wound Care:routine pressure relief measures 7. Fluids/Electrolytes/Nutrition:Monitor I/O.     Basic Metabolic Panel:    Component Value Date/Time   NA 140 04/17/2017 1545   K 4.4 04/17/2017 1545   CL 103 04/17/2017 1545   CO2 27 04/17/2017 1545   BUN 12 04/17/2017 1545   CREATININE 1.02 04/17/2017 1545   GLUCOSE 90 04/17/2017 1545   CALCIUM 9.2 04/17/2017 1545    8. Constipation: Resolved. 9. Small PFO: To follow-up with Dr. Excell Seltzerooper on an outpatient basis.  This may be the source of a cryptogenic thrombus. .10. Anxiety disorder: continue  Prozac. 11. Transaminitis   LFTs were normal on December 21.  Increase significantly by 1227.  Will recheck on December 31.  LOS (Days) 3 A FACE TO FACE EVALUATION WAS PERFORMED  Aerial Dilley H Santina Trillo 04/20/2017 6:51 AM

## 2017-04-20 NOTE — Progress Notes (Signed)
Occupational Therapy Session Note  Patient Details  Name: Jake BameRobert F Moore MRN: [id number] Date of Birth: 11/05/1975  Today's Date: 04/20/2017 OT Individual Time: 1105-1200 OT Individual Time Calculation (min): 55 min  and Today's Date: 04/20/2017 OT Missed Time: 20 Minutes Missed Time Reason: Other (comment)(Meeting with cardiologist)   Short Term Goals: Week 1:  OT Short Term Goal 1 (Week 1): LTG=STG  Skilled Therapeutic Interventions/Progress Updates:    Pt seen for OT session focusing on dynamic standing balance with emphasis on visual/ vestibular deficits.  Cardiac MD present upon arrival and as a result, pt missed 20 minutes of skilled tx time.   When MD exited, pt agreeable to tx session. He stated wife assisted with bathing/dressing earlier in the morning and did not need to complete any ADLs this morning.  He ambulated throughout session at distant supervision level without AD. Focus on head turning and multi-tasking while ambulating. In therapy gym, pt picked small items up off floor, returning to upright position after each trial, reporting no increase in "woozy" feeling with this movement. He then completed clothes pin tree while standing on wedge foam mat, pt required to reach across midline and bend down to pick up items, close supervision-guarding assist to complete with seated rest breaks provided btwn trials.  Completed Dynavision activity, standing at various positions and distances away from board to hit targets, pt's reaction timed remained the same during all trials, slightly slower response time for L lower quadrant of board.  Pt ambulated off unit to hospital cafeteria. Practiced ambulation and head turning in moderately stimulating environment with increased lighting. Pt reports no increase in symptoms. He returned to unit taking stairs, climbed 4 flights of stairs with supervision using hand rail. Seated rest break following stairs. Discussed with pt and wife regarding  continuum of care, current deficits and functional implications, fall risk, and d/c planning. Both feel comfortable with d/c as long as therapy staff feel he is safe. Pt returned to room at end of session, left seated in recliner with all needs in reach and wife present.   Therapy Documentation Precautions:  Precautions Precautions: Fall Precaution Comments: pt reports increased fall/lean tendency to R side Restrictions Weight Bearing Restrictions: No Pain:   No/ denies pain ADL: ADL ADL Comments: see functional navigator  See Function Navigator for Current Functional Status.   Therapy/Group: Individual Therapy  Amar Keenum L 04/20/2017, 6:46 AM

## 2017-04-20 NOTE — Progress Notes (Signed)
Physical Therapy Session Note  Patient Details  Name: Jake BameRobert F Moore MRN: [id number] Date of Birth: 03/18/1976  Today's Date: 04/20/2017 PT Individual Time: 1602-1659 PT Individual Time Calculation (min): 57 min   Short Term Goals: Week 1:  PT Short Term Goal 1 (Week 1): =LTGs due to ELOS  Skilled Therapeutic Interventions/Progress Updates:    Pt seated in recliner upon PT arrival, agreeable to therapy tx and denies pain. Pt ambulated from room>gym with supervision, no AD, occasional LOB but pt self corrects with no assist. Pt worked on higher level balance this session including: standing on foam with eyes open/eyes closed/feet apart/feet together, pt used biodex for limits of stability training, side stepping, ambulating with change in speed and toe taps on cones. Pt ambulated within the unit >500 ft from rehab unit to [employer] with supervision, working on endurance and ambulation outside on uneven surfaces. Pt able to maintain balance in order to pick up and hold his dog. Discussed d/c plan with wife and pt, vestibular follow up at outpatient PT. Pt ambulated back to rehab unit >500 ft with supervision. Pt left seated in recliner with needs in reach. Pt made Mod I in his room after this session.   Therapy Documentation Precautions:  Precautions Precautions: Fall Precaution Comments: pt reports increased fall/lean tendency to R side Restrictions Weight Bearing Restrictions: No   See Function Navigator for Current Functional Status.   Therapy/Group: Individual Therapy  Cresenciano GenreEmily van Schagen, PT, DPT 04/20/2017, 4:59 PM

## 2017-04-20 NOTE — Progress Notes (Signed)
Occupational Therapy Session Note  Patient Details  Name: Jake BameRobert F Snelgrove MRN: [id number] Date of Birth: 12/30/1975  Today's Date: 04/20/2017 OT Individual Time: 1305-1405 OT Individual Time Calculation (min): 60 min    Short Term Goals: Week 1:  OT Short Term Goal 1 (Week 1): LTG=STG  Skilled Therapeutic Interventions/Progress Updates:    Pt completed functional mobility to the therapy gym with supervision and no assistive device during session. Once in the gym had pt complete gaze stabilization exercises for 1 set of 1 min each in standing, with head turns left to right and also up and down while stabilizing on fixed target.  He was able to complete without any increase in his wooziness.  Had him complete second set of gaze stabilization exercises while standing on foam as well, again without difficulty.  Completed VOR cancellation test in sitting with pt turning head and eyes to target simultaneously.  Noted occasional delay in being able to follow, especially when scanning to the right.  Had pt work on VOR cancellation exercises both horizontally and vertically while sitting and standing.  No increase in symptoms noted with this as well.  Next had pt ambulate to pick up cones around the gym with supervision.  No LOB noted but pt still reports feeling like he is being pulled backwards and to the right.  Progressed to using the rebounder, ball toss in standing, and ball toss while walking.  Noted greater LOB with mobility while tossing the ball but pt was able to self-correct LOB without physical assist.  Returned to room at end of session with pt left up in bedside recliner and call button in reach.  Educated on X1 and X2 exercises in sitting and in standing with stable surface next to him behind and on the right side.  He demonstrates understanding.  Handout also provided for reference.    Therapy Documentation Precautions:  Precautions Precautions: Fall Precaution Comments: pt reports  increased fall/lean tendency to R side Restrictions Weight Bearing Restrictions: No   Pain: Pain Assessment Pain Assessment: No/denies pain ADL: See Function Navigator for Current Functional Status.   Therapy/Group: Individual Therapy  Hooria Gasparini OTR/L 04/20/2017, 3:38 PM

## 2017-04-21 ENCOUNTER — Inpatient Hospital Stay (HOSPITAL_COMMUNITY): Payer: BLUE CROSS/BLUE SHIELD

## 2017-04-21 ENCOUNTER — Inpatient Hospital Stay (HOSPITAL_COMMUNITY): Payer: BLUE CROSS/BLUE SHIELD | Admitting: Occupational Therapy

## 2017-04-21 ENCOUNTER — Inpatient Hospital Stay (HOSPITAL_COMMUNITY): Payer: BLUE CROSS/BLUE SHIELD | Admitting: Physical Therapy

## 2017-04-21 DIAGNOSIS — Z880 Allergy status to penicillin: Secondary | ICD-10-CM

## 2017-04-21 DIAGNOSIS — Z87891 Personal history of nicotine dependence: Secondary | ICD-10-CM

## 2017-04-21 DIAGNOSIS — Z836 Family history of other diseases of the respiratory system: Secondary | ICD-10-CM

## 2017-04-21 DIAGNOSIS — N179 Acute kidney failure, unspecified: Secondary | ICD-10-CM

## 2017-04-21 DIAGNOSIS — Z832 Family history of diseases of the blood and blood-forming organs and certain disorders involving the immune mechanism: Secondary | ICD-10-CM

## 2017-04-21 DIAGNOSIS — I63011 Cerebral infarction due to thrombosis of right vertebral artery: Secondary | ICD-10-CM

## 2017-04-21 DIAGNOSIS — Z148 Genetic carrier of other disease: Secondary | ICD-10-CM

## 2017-04-21 DIAGNOSIS — Z8249 Family history of ischemic heart disease and other diseases of the circulatory system: Secondary | ICD-10-CM

## 2017-04-21 DIAGNOSIS — R739 Hyperglycemia, unspecified: Secondary | ICD-10-CM

## 2017-04-21 LAB — COMPREHENSIVE METABOLIC PANEL
ALT: 51 U/L (ref 17–63)
ANION GAP: 5 (ref 5–15)
AST: 28 U/L (ref 15–41)
Albumin: 3.5 g/dL (ref 3.5–5.0)
Alkaline Phosphatase: 58 U/L (ref 38–126)
BILIRUBIN TOTAL: 0.5 mg/dL (ref 0.3–1.2)
BUN: 14 mg/dL (ref 6–20)
CO2: 26 mmol/L (ref 22–32)
Calcium: 8.9 mg/dL (ref 8.9–10.3)
Chloride: 107 mmol/L (ref 101–111)
Creatinine, Ser: 1.04 mg/dL (ref 0.61–1.24)
Glucose, Bld: 186 mg/dL — ABNORMAL HIGH (ref 65–99)
POTASSIUM: 3.8 mmol/L (ref 3.5–5.1)
Sodium: 138 mmol/L (ref 135–145)
TOTAL PROTEIN: 5.8 g/dL — AB (ref 6.5–8.1)

## 2017-04-21 MED ORDER — SENNOSIDES-DOCUSATE SODIUM 8.6-50 MG PO TABS: 2.0000 | ORAL_TABLET | Freq: Every day | ORAL | 0 refills | Status: DC

## 2017-04-21 MED ORDER — ROSUVASTATIN CALCIUM 20 MG PO TABS: 20.0000 mg | ORAL_TABLET | Freq: Every day | ORAL | 0 refills | Status: DC

## 2017-04-21 MED ORDER — ASPIRIN 325 MG PO TABS: 325.0000 mg | ORAL_TABLET | Freq: Every day | ORAL | Status: DC

## 2017-04-21 MED ORDER — ASPIRIN 325 MG PO TABS: 325.0000 mg | ORAL_TABLET | Freq: Every day | ORAL | 0 refills | Status: DC

## 2017-04-21 MED ORDER — CLOPIDOGREL BISULFATE 75 MG PO TABS: 75.0000 mg | ORAL_TABLET | Freq: Every day | ORAL | 0 refills | Status: DC

## 2017-04-21 NOTE — Progress Notes (Signed)
Physical Therapy Discharge Summary  Patient Details  Name: OLANDO WILLEMS MRN: [id number] Date of Birth: 1976/01/22  Today's Date: 04/21/2017 PT Individual Time: 0800-0855 PT Individual Time Calculation (min): 55 min    Patient has met 6 of 6 long term goals due to improved activity tolerance, improved balance and improved postural control.  Patient to discharge at an ambulatory level Modified Independent.    All Goals Met.    Recommendation:  Patient will benefit from ongoing skilled PT services in outpatient setting to continue to advance safe functional mobility, address ongoing impairments in balance, vestibular system, and minimize fall risk.  Equipment: No equipment provided  Reasons for discharge: treatment goals met  Patient/family agrees with progress made and goals achieved: Yes   PT Treatment Interventions: Pt supine in bed upon PT arrival, agreeable to therapy tx and denies pain. Discharge summary completed this session with emphasis on functional mobility and high level balance. Pt ambulated to the bathroom Mod I and performed all dressing Mod I, pt ambulated to the sink and brushed teeth Mod I. Pt ambulated within the unit this session >300 ft Mod I, continues to demonstrate an ataxic gait pattern but self-corrects any LOB with appropriate balance reactions. Pt ascended/descended 20 steps with a single handrail and with supervision, reciprocal pattern. Pt completed floor transfers Mod I without using a device to pull up on. Pt completed DGI and Berg Balance Test this session, therapist discussed results with the pt. Therapist addressed pt questions and concerns regarding appropriate physical activities to engage in when he goes home. Pt completed all transfers and car transfer via ambulation and Mod I. Reviewed vestibular exercises provided by OT yesterday. Pt ambulated back to room and left seated in recliner with needs in reach.   PT  Discharge Precautions/Restrictions Precautions Precautions: Fall Restrictions Weight Bearing Restrictions: No Vital Signs Therapy Vitals Temp: 97.9 F (36.6 C) Temp Source: Oral Pulse Rate: 72 Resp: 18 BP: (!) 126/93 Patient Position (if appropriate): Sitting Oxygen Therapy SpO2: 99 % O2 Device: Not Delivered Pain Pain Assessment Pain Assessment: No/denies pain Pain Score: 0-No pain Cognition Overall Cognitive Status: Within Functional Limits for tasks assessed Arousal/Alertness: Awake/alert Orientation Level: Oriented X4 Focused Attention: Appears intact Sustained Attention: Appears intact Selective Attention: Appears intact Memory: Appears intact Awareness: Appears intact Problem Solving: Appears intact Safety/Judgment: Appears intact Sensation Sensation Light Touch: Impaired Detail Light Touch Impaired Details: Impaired LUE;Impaired LLE(and R side of face) Hot/Cold Impaired Details: Impaired LLE;Impaired LUE(Impaired to R side of face) Coordination Gross Motor Movements are Fluid and Coordinated: No Fine Motor Movements are Fluid and Coordinated: Yes Motor  Motor Motor: Ataxia;Abnormal postural alignment and control  Trunk/Postural Assessment  Cervical Assessment Cervical Assessment: Within Functional Limits Thoracic Assessment Thoracic Assessment: Within Functional Limits Lumbar Assessment Lumbar Assessment: Within Functional Limits Postural Control Trunk Control: biasis lean to the right   Balance Balance Balance Assessed: Yes Berg Balance Test Sit to Stand: Able to stand without using hands and stabilize independently Standing Unsupported: Able to stand safely 2 minutes Sitting with Back Unsupported but Feet Supported on Floor or Stool: Able to sit safely and securely 2 minutes Stand to Sit: Sits safely with minimal use of hands Transfers: Able to transfer safely, minor use of hands Standing Unsupported with Eyes Closed: Able to stand 10 seconds  safely Standing Ubsupported with Feet Together: Able to place feet together independently and stand 1 minute safely From Standing, Reach Forward with Outstretched Arm: Can reach confidently >25 cm (10") From  Standing Position, Pick up Object from Floor: Able to pick up shoe safely and easily From Standing Position, Turn to Look Behind Over each Shoulder: Looks behind from both sides and weight shifts well Turn 360 Degrees: Able to turn 360 degrees safely in 4 seconds or less Standing Unsupported, Alternately Place Feet on Step/Stool: Able to stand independently and safely and complete 8 steps in 20 seconds Standing Unsupported, One Foot in Front: Loses balance while stepping or standing Standing on One Leg: Unable to try or needs assist to prevent fall Total Score: 48 Dynamic Gait Index Level Surface: Normal Change in Gait Speed: Mild Impairment Gait with Horizontal Head Turns: Mild Impairment Gait with Vertical Head Turns: Normal Gait and Pivot Turn: Normal Step Over Obstacle: Mild Impairment Step Around Obstacles: Normal Steps: Normal Total Score: 21 Static Sitting Balance Static Sitting - Level of Assistance: 6: Modified independent (Device/Increase time) Dynamic Sitting Balance Dynamic Sitting - Level of Assistance: 6: Modified independent (Device/Increase time) Static Standing Balance Static Standing - Level of Assistance: 6: Modified independent (Device/Increase time) Dynamic Standing Balance Dynamic Standing - Level of Assistance: 6: Modified independent (Device/Increase time) Extremity Assessment  RLE Assessment RLE Assessment: Within Functional Limits LLE Assessment LLE Assessment: Within Functional Limits   See Function Navigator for Current Functional Status.  Emily van Schagen, PT, DPT 04/21/2017, 8:07 AM  

## 2017-04-21 NOTE — Progress Notes (Signed)
Patient ID: Jake BameRobert F Moore, male   DOB: 01/21/1976, 41 y.o.   MRN: [id number]  Noted that patient will be going home today.   Still waiting for factor 8 level.  He is on ASA/Plavix.   I would like him seen by hematologist prior to discharge, Dr. Cyndie ChimeGranfortuna to see today.   Jake Moore 04/21/2017 9:09 AM

## 2017-04-21 NOTE — Discharge Instructions (Signed)
Inpatient Rehab Discharge Instructions  Jake BameRobert F Moore Discharge date and time:  04/21/17  Activities/Precautions/ Functional Status: Activity: no lifting, driving, or strenuous exercise  till cleared by MD Diet: cardiac diet Wound Care: keep wound clean and dry   Functional status:  ___ No restrictions     ___ Walk up steps independently ___ 24/7 supervision/assistance   ___ Walk up steps with assistance _X__ Intermittent supervision/assistance  _X__ Bathe/dress independently ___ Walk with walker     ___ Bathe/dress with assistance ___ Walk Independently    ___ Shower independently ___ Walk with assistance    ___ Shower with assistance _X__ No alcohol     ___ Return to work/school ________   COMMUNITY REFERRALS UPON DISCHARGE:    Outpatient: PT                    Agency:  [facility]   Phone: [phone]                Appointment Date/Time:  04/25/17 @ 8:00 am  (please arrive at 7:45 am)   Special Instructions:   STROKE/TIA DISCHARGE INSTRUCTIONS SMOKING Cigarette smoking nearly doubles your risk of having a stroke & is the single most alterable risk factor  If you smoke or have smoked in the last 12 months, you are advised to quit smoking for your health.  Most of the excess cardiovascular risk related to smoking disappears within a year of stopping.  Ask you doctor about anti-smoking medications  [place] Quit Line: 1-800-QUIT NOW  Free Smoking Cessation Classes [phone]  CHOLESTEROL Know your levels; limit fat & cholesterol in your diet  Lipid Panel     Component Value Date/Time   CHOL 141 04/12/2017 0254   TRIG 65 04/12/2017 0254   HDL 71 04/12/2017 0254   CHOLHDL 2.0 04/12/2017 0254   VLDL 13 04/12/2017 0254   LDLCALC 57 04/12/2017 0254      Many patients benefit from treatment even if their cholesterol is at goal.  Goal: Total Cholesterol (CHOL) less than 160  Goal:  Triglycerides (TRIG) less than 150  Goal:  HDL greater than 40  Goal:  LDL  (LDLCALC) less than 100   BLOOD PRESSURE American Stroke Association blood pressure target is less that 120/80 mm/Hg  Your discharge blood pressure is:  BP: (!) 145/86  Monitor your blood pressure  Limit your salt and alcohol intake  Many individuals will require more than one medication for high blood pressure  DIABETES (A1c is a blood sugar average for last 3 months) Goal HGBA1c is under 7% (HBGA1c is blood sugar average for last 3 months)  Diabetes:     Lab Results  Component Value Date   HGBA1C 5.2 04/12/2017     Your HGBA1c can be lowered with medications, healthy diet, and exercise.  Check your blood sugar as directed by your physician  Call your physician if you experience unexplained or low blood sugars.  PHYSICAL ACTIVITY/REHABILITATION Goal is 30 minutes at least 4 days per week  Activity: No driving, Therapies: see above Return to work: to be decided on follow up  Activity decreases your risk of heart attack and stroke and makes your heart stronger.  It helps control your weight and blood pressure; helps you relax and can improve your mood.  Participate in a regular exercise program.  Talk with your doctor about the best form of exercise for you (dancing, walking, swimming, cycling).  DIET/WEIGHT Goal is to maintain a  healthy weight  Your discharge diet is: Diet regular Room service appropriate? Yes; Fluid consistency: Thin  liquids Your height is:  Height: 5\' 10"  (177.8 cm) Your current weight is: Weight: 76.2 kg (168 lb) Your Body Mass Index (BMI) is:  BMI (Calculated): 24.11  Following the type of diet specifically designed for you will help prevent another stroke.  You are at goal weight   Your goal Body Mass Index (BMI) is 19-24.  Healthy food habits can help reduce 3 risk factors for stroke:  High cholesterol, hypertension, and excess weight.  RESOURCES Stroke/Support Group:  Call [phone]   STROKE EDUCATION PROVIDED/REVIEWED AND GIVEN TO PATIENT  Stroke warning signs and symptoms How to activate emergency medical system (call 911). Medications prescribed at discharge. Need for follow-up after discharge. Personal risk factors for stroke. Pneumonia vaccine given:  Flu vaccine given:  My questions have been answered, the writing is legible, and I understand these instructions.  I will adhere to these goals & educational materials that have been provided to me after my discharge from the hospital.     My questions have been answered and I understand these instructions. I will adhere to these goals and the provided educational materials after my discharge from the hospital.  Patient/Caregiver Signature _______________________________ Date __________  Clinician Signature _______________________________________ Date __________  Please bring this form and your medication list with you to all your follow-up doctor's appointments.

## 2017-04-21 NOTE — Progress Notes (Signed)
Occupational Therapy Session Note  Patient Details  Name: Jake Moore MRN: [id number] Date of Birth: 05/26/1975  Today's Date: 04/21/2017 OT Individual Time: 1030-1100 OT Individual Time Calculation (min): 30 min   Short Term Goals: Week 1:  OT Short Term Goal 1 (Week 1): LTG=STG  Skilled Therapeutic Interventions/Progress Updates:    Pt greeted semi reclined in bed, ready for tx. He ambulated to gym at Mod I level, retrieved therapeutic items, and then proceeded to midwest hallway near elevators. Pt has several children and often plays sports with them in yard. Engaged in prep activity of VOR exercises which pt recalled from memory. Then worked on balance reactions with vestibular challenges while throwing/catching football and while playing modified badmitten. Pt with 1 major LOB initially while receiving football, however, able to recover without physical assist. Throughout, pt moving head to track OT, stooping to floor to catch ball or birdie, and recovering from small LOBs. Educated pt on methods for promoting safety while playing catch with children at home. Pt then ambulated with OT back to gym to return items, after proceeded back to room. Pt left in bed with all needs within reach.   Therapy Documentation Precautions:  Precautions Precautions: Fall Precaution Comments: pt reports increased fall/lean tendency to R side Restrictions Weight Bearing Restrictions: No Pain: Pain Assessment Pain Assessment: 0-10 Pain Score: 0-No pain ADL: ADL ADL Comments: see functional navigator Vision Baseline Vision/History: Wears glasses(pt now has L medial side of lenses taped to occlude L eye vision when looking to the R field to avoid diplopia) Wears Glasses: Reading only Patient Visual Report: Eye fatigue/eye pain/headache;Nausea/blurring vision with head movement;Diplopia Eye Alignment: Impaired (comment)(impaired only when B eyes are gazing in R visual field, slight  misalignment) Ocular Range of Motion: Within Functional Limits Alignment/Gaze Preference: Within Defined Limits Tracking/Visual Pursuits: Able to track stimulus in all quads without difficulty Visual Fields: No apparent deficits Diplopia Assessment: Disappears with one eye closed;Objects split side to side;Present in far gaze;Only with right gaze;Objects split on top of one another Perception  Perception: Within Functional Limits Praxis Praxis: Intact :    See Function Navigator for Current Functional Status.   Therapy/Group: Individual Therapy  [name] 04/21/2017, 12:21 PM

## 2017-04-21 NOTE — Progress Notes (Signed)
Occupational Therapy Session Note  Patient Details  Name: Jake Moore MRN: [id number] Date of Birth: 04/30/1975  Today's Date: 04/21/2017 OT Individual Time: 0930-1015 OT Individual Time Calculation (min): 45 min    Short Term Goals: No short term goals set  Skilled Therapeutic Interventions/Progress Updates:    Pt seen this session to ensure that he was safe to complete all self care at a mod I/ I level.  Pt cued to use visual fixation techniques in the shower when reaching down and standing on one leg to wash feet.  Pt was able to shower, dress, toilet and groom independently. He reported no increased dizziness with the activity.  Reviewed his vestibular exercises and added in recommendation of recording how long he can tolerate each exercise working up to 1-2 minutes.  Discussed computer screen modifications of brightness, contrast, font size to increase ease with reading and taking frequent rest breaks.  Discussed using visual fixation strategies, breathing and use of essential oils when he will be in a car or in an elevator.  Pt does feel that his vertigo has improved significantly from admission.  Until he has his outpt vestibular appt, pt can also find online motion sensitivity/accomodation exercises.  Pt in room with all needs met.  Therapy Documentation Precautions:  Precautions Precautions: Fall Precaution Comments: pt reports increased fall/lean tendency to R side Restrictions Weight Bearing Restrictions: No  Pain: Pain Assessment Pain Assessment: No/denies pain Pain Score: 0-No pain ADL: ADL ADL Comments: see functional navigator Vision Baseline Vision/History: Wears glasses(pt now has L medial side of lenses taped to occlude L eye vision when looking to the R field to avoid diplopia) Wears Glasses: Reading only Patient Visual Report: Eye fatigue/eye pain/headache;Nausea/blurring vision with head movement;Diplopia Eye Alignment: Impaired (comment)(impaired only  when B eyes are gazing in R visual field, slight misalignment) Ocular Range of Motion: Within Functional Limits Alignment/Gaze Preference: Within Defined Limits Tracking/Visual Pursuits: Able to track stimulus in all quads without difficulty Visual Fields: No apparent deficits Diplopia Assessment: Disappears with one eye closed;Objects split side to side;Present in far gaze;Only with right gaze;Objects split on top of one another Perception  Perception: Within Functional Limits Praxis Praxis: Intact  See Function Navigator for Current Functional Status.   Therapy/Group: Individual Therapy  SAGUIER,JULIA 04/21/2017, 10:30 AM 

## 2017-04-21 NOTE — Discharge Summary (Signed)
Physician Discharge Summary  Patient ID: Jake Moore MRN: [id number] DOB/AGE: 09-12-1975 41 y.o.  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 Recent small cerebellar infarct. EXAM: CT HEAD WITHOUT CONTRAST TECHNIQUE: Contiguous axial images were obtained from the base of the skull through the vertex without intravenous contrast. COMPARISON:  Brain MRI 04/12/2017 FINDINGS: Brain: No mass lesion, intraparenchymal hemorrhage or extra-axial collection. Focal area of hypoattenuation in the right cerebellum corresponds to the infarct seen on MRI. Brain parenchyma and CSF-containing spaces are normal for age. Vascular: No hyperdense vessel or unexpected calcification. Skull: Normal visualized skull base, calvarium and extracranial soft tissues. Sinuses/Orbits: No sinus fluid levels or advanced mucosal thickening. No mastoid effusion. Normal orbits. IMPRESSION: 1. Expected appearance of small right cerebellar infarct, without hemorrhage. No mass effect. 2. Otherwise normal brain. Electronically Signed   By: Ulyses Jarred M.D.   On: 04/13/2017 03:06   Mr Brain Wo Contrast  Result Date: 04/12/2017 CLINICAL DATA:  Continued surveillance of stroke. RIGHT vertebral occlusion. EXAM: MRI HEAD WITHOUT CONTRAST MRA NECK WITHOUT AND WITH CONTRAST TECHNIQUE: Multiplanar, multiecho pulse sequences of the brain and surrounding structures were obtained without intravenous contrast. Angiographic images of the neck were obtained using MRA technique with and without intravenous contrast. Carotid stenosis  measurements (when applicable) are obtained utilizing NASCET criteria, using the distal internal carotid diameter as the denominator. CONTRAST:  67m MULTIHANCE GADOBENATE DIMEGLUMINE 529 MG/ML IV SOLN COMPARISON:  MR brain 04/11/2017.  CTA head neck 04/11/2017. FINDINGS: MRI HEAD FINDINGS Brain: Unchanged subcentimeter foci of restricted diffusion, RIGHT brainstem and inferior cerebellum, consistent with multifocal infarction in the setting of RIGHT vertebral and RIGHT PICA occlusion. No new areas of concern. Vascular: See below. Skull and upper cervical spine: Normal marrow signal. Sinuses/Orbits: Unremarkable. Other: Incidental note is made of medial positioning of the RIGHT vocal cord. This is likely the result of the lateral medullary infarct. MRA NECK FINDINGS No great vessel stenosis.  Normal transverse arch.  Bovine trunk. Normal BILATERAL common carotid arteries. Normal BILATERAL carotid bifurcations. No ICA dissection. No changes of fibromuscular dysplasia. Normal BILATERAL subclavian arteries.  Normal vertebral origins. LEFT vertebral dominant and widely patent through the neck. RIGHT vertebral in the neck is diminutive, related to the V3 occlusion at the C1 level. No definitive evidence pointing to dissection is observed, such as fibromuscular disease, or blood products surrounding the vessel; nevertheless, dissection appears to be the most likely etiology. IMPRESSION: Unchanged subcentimeter foci of restricted diffusion consistent with acute nonhemorrhagic infarction, involving the brainstem and inferior cerebellum, RIGHT PICA territory. 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 Of note, a history of neck pain was elicited by the neurologist. Hypercoagulable state resulting in thrombosis is also a consideration, and appropriate lab panel is pending. Except for the RIGHT vertebral  occlusion, unremarkable appearing extracranial circulation. Electronically Signed   By: JStaci RighterM.D.   On: 04/12/2017 13:44     Labs:  Basic Metabolic Panel: Recent Labs  Lab 04/15/17 0555 04/17/17 1545 04/21/17  0742  NA 137 140 138  K 4.0 4.4 3.8  CL 106 103 107  CO2 21* 27 26  GLUCOSE 93 90 186*  BUN _0 CREATININE 0.82 1.02 1.04  CALCIUM 9.1 9.2 8.9    CBC: CBC Latest Ref Rng & Units 04/17/2017 04/15/2017 04/14/2017  WBC 4.0 - 10.5 K/uL 5.8 5.3 6.5  Hemoglobin 13.0 - 17.0 g/dL 14.5 14.3 13.7  Hematocrit 39.0 - 52.0 % 42.3 41.9 40.1  Platelets 150 - 400 K/uL 165 167 165    CBG: No results for input(s): GLUCAP in the last 168 hours. Hepatic Function Latest Ref Rng & Units 04/21/2017 04/17/2017 04/11/2017  Total Protein 6.5 - 8.1 g/dL 5.8(L) 6.2(L) 5.7(L)  Albumin 3.5 - 5.0 g/dL 3.5 3.9 3.6  AST 15 - 41 U/L 28 83(H) 21  ALT 17 - 63 U/L 51 125(H) 20  Alk Phosphatase 38 - 126 U/L 58 65 49  Total Bilirubin 0.3 - 1.2 mg/dL 0.5 0.4 0.6    Brief HPI:   Jake Delarocha McIntoshis a 41 y.o.right handed malewith history of anxiety/ADD, hyperlipidemiawho was admitted on 04/11/17 with HA, dizziness and right facial numbness. 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 Repeat cranial CT scandone 12/23 due tofluctuatingsymptoms and showed no hemorrhagesmall right cerebellar infarct without hemorrhage.Dr. 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 Patient with vestibular symptoms and balance deficits. CIR was recommended for follow up therapy.     Hospital Course: Jake Moore was admitted to rehab 04/17/2017 for inpatient  therapies to consist of PT and OT at least three hours five days a week. Past admission physiatrist, therapy team and rehab RN have worked together to provide customized collaborative inpatient rehab. He was maintained on ASA and Plavix during his rehab stay. Blood pressures were monitored on bid basis and have been controlled overall. Anxiety has been managed on home dose Prozac. Nausea has resolved and dizziness has improved with gaze stabilization exercises. Labs at admission showed elevation in LFTs which have resolved. Mild elevation in fasting BS noted but Hgb A1C is WNL at 5.2.    Factor VIII levels are pending at this time and hematology was consulted prior to discharge. Coagulopathy panel negative so far but Dr. Beryle Beams felt that there were "number of provocative things about his history to suggest that he may have a previously undescribed coagulopathy that would best be explored at the [facility] level". He  strongly recommended extending dual antiplatelets for longer than 3 months and following Factor VIII level over time as elevation may reflect acute phase reaction. Patient currently has follow up appointment with Dr. Odis Hollingshead at [facility].  His vestibular symptoms have improved and he continues to demonstrate ataxic gait with appropriate balance reactions to correct balance deficits. He is modified independent at discharge and will continue to receive Vestibular Rehab at [facility] after discharge.    Rehab course: During patient's stay in rehab weekly team conferences were held to monitor patient's progress, set goals and discuss barriers to discharge. At admission, patient required min assist with ADL tasks and mobility. He has had improvement in activity tolerance, balance, postural control, as well as ability to compensate for deficits. He is  able to complete ADL tasks at modified independent level and is ambulating >300' without AD.     Disposition: 01-Home or  Self Care  Diet: Heart healthy diet.   Special Instructions: 1. No driving or strenuous activity till cleared by MD.    Discharge Instructions    Ambulatory referral to Physical Medicine Rehab   Complete by:  As directed    1-2 weeks transitional care appt     Allergies as of 04/21/2017      Reactions   Penicillins Hives   Has patient had a PCN reaction causing immediate rash, facial/tongue/throat swelling, SOB or lightheadedness with hypotension: YES Has patient had a PCN reaction causing severe rash involving mucus membranes or skin necrosis: NO Has patient had a PCN reaction that required hospitalizationNO Has patient had a PCN reaction occurring within the last 10 years: NO If all of the above answers are "NO", then may proceed with Cephalosporin use.      Medication List    STOP taking these medications   atomoxetine 10 MG capsule Commonly known as:  STRATTERA   ibuprofen 200 MG tablet Commonly known as:  ADVIL,MOTRIN   naproxen 500 MG tablet Commonly known as:  NAPROSYN   ondansetron 8 MG tablet Commonly known as:  ZOFRAN     TAKE these medications   aspirin 325 MG tablet Take 1 tablet (325 mg total) by mouth daily. Start taking on:  04/22/2017   clopidogrel 75 MG tablet Commonly known as:  PLAVIX Take 1 tablet (75 mg total) by mouth daily. Start taking on:  04/22/2017   FLUoxetine 20 MG tablet Commonly known as:  PROZAC Take 20 mg by mouth daily.   meclizine 25 MG tablet Commonly known as:  ANTIVERT Take 1 tablet (25 mg total) by mouth 3 (three) times daily as needed for dizziness.   multivitamin tablet Take 1 tablet by mouth daily.   rosuvastatin 20 MG tablet Commonly known as:  CRESTOR Take 1 tablet (20 mg total) by mouth daily. What changed:    medication strength  how much to take   senna-docusate 8.6-50 MG tablet Commonly known as:  Senokot-S Take 2 tablets by mouth at bedtime.      Follow-up Information    Jamse Arn, MD  Follow up.   Specialty:  Physical Medicine and Rehabilitation Why:  office will call you with follow up appointment Contact information: [place] [address] [phone]        [facility] Follow up on 04/18/2017.   Specialty:  Cardiology Contact information: [address] [id number] Danice Goltz [place]       Annia Belt, MD. Call.   Specialty:  Oncology Why:  for follow up appointment Contact information: [place] [address] [phone]        Garvin Fila, MD. Call in 1 day(s).   Specialties:  Neurology, Radiology Why:  for follow up appointment in 4-6 weeks Contact information: [address] [place] [address] [phone]        Marton Redwood, MD Follow up.   Specialty:  Internal Medicine Why:  Call on Friday if you have not heard from them regarding post hospital follow up Contact information: [place] [address] [phone]           Signed: Bary Leriche 04/21/2017, 5:04 PM

## 2017-04-21 NOTE — Progress Notes (Signed)
[facility]     PROGRESS NOTE  Subjective/Complaints:  Pt seen laying in bed this AM.  He slept well overnight and had a good weekend.  Discussed with therapies, pt to d/c today after therapies. Patient with several questions regarding meds, follow up appointments, etc. He notes improvement in headaches and vision.   ROS: Denies CP, SOB, N/V/D.  Objective: Vital Signs: Blood pressure (!) 126/93, pulse 72, temperature 97.9 F (36.6 C), temperature source Oral, resp. rate 18, height 5\' 10"  (1.778 m), weight 76.2 kg (168 lb), SpO2 99 %. No results found. No results for input(s): WBC, HGB, HCT, PLT in the last 72 hours. Recent Labs    04/21/17 0742  NA 138  K 3.8  CL 107  GLUCOSE 186*  BUN 14  CREATININE 1.04  CALCIUM 8.9   CBG (last 3)  No results for input(s): GLUCAP in the last 72 hours.  Wt Readings from Last 3 Encounters:  04/18/17 76.2 kg (168 lb)  04/14/17 74.4 kg (164 lb)  07/14/16 74.8 kg (165 lb)    Physical Exam:  BP (!) 126/93 (BP Location: Left Arm)   Pulse 72   Temp 97.9 F (36.6 C) (Oral)   Resp 18   Ht 5\' 10"  (1.778 m)   Wt 76.2 kg (168 lb)   SpO2 99%   BMI 24.11 kg/m  Constitutional: He appearswell-developedand well-nourished.No distress.  HENT: Normocephalicand atraumatic.  Eyes:EOMare normal. No discharge. Cardiovascular:RRR. No JVD. Respiratory:Effort normal and breath sounds normal.  WU:[id number]:Bowel sounds are normal. He exhibitsno distension.  Musculoskeletal: He exhibits noedemaor tenderness.  Neurological: He isalertand oriented. Speech clear.  Able to follow commands without difficulty.  Motor: 5/5 throughout (unchanged) No ataxia B/l UE Reduced right facial sensation to touch (improving). Skin: Skin iswarmand dry. He isnot diaphoretic.  Psychiatric: He has anormal mood and affect. Hisbehavior is normal.Judgmentand thought contentnormal.   Assessment/Plan: 1. Functional deficits  secondary to Wallenberg Syndrome which require 3+ hours per day of interdisciplinary therapy in a comprehensive inpatient rehab setting. Physiatrist is providing close team supervision and 24 hour management of active medical problems listed below. Physiatrist and rehab team continue to assess barriers to discharge/monitor patient progress toward functional and medical goals.  Function:  Bathing Bathing position   Position: Shower  Bathing parts Body parts bathed by patient: Right arm, Right lower leg, Left lower leg, Left arm, Chest, Abdomen, Front perineal area, Buttocks, Right upper leg, Left upper leg    Bathing assist Assist Level: Supervision or verbal cues      Upper Body Dressing/Undressing Upper body dressing   What is the patient wearing?: Pull over shirt/dress     Pull over shirt/dress - Perfomed by patient: Thread/unthread right sleeve, Thread/unthread left sleeve, Put head through opening, Pull shirt over trunk          Upper body assist Assist Level: Supervision or verbal cues      Lower Body Dressing/Undressing Lower body dressing   What is the patient wearing?: Pants, Underwear, Shoes, Non-skid slipper socks Underwear - Performed by patient: Thread/unthread right underwear leg, Thread/unthread left underwear leg, Pull underwear up/down   Pants- Performed by patient: Thread/unthread right pants leg, Thread/unthread left pants leg, Pull pants up/down     Non-skid slipper socks- Performed by helper: Don/doff right sock, Don/doff left sock     Shoes - Performed by patient: Don/doff right shoe, Don/doff left shoe            Lower body  assist Assist for lower body dressing: Supervision or verbal cues      Toileting Toileting Toileting activity did not occur: No continent bowel/bladder event Toileting steps completed by patient: Adjust clothing prior to toileting, Performs perineal hygiene, Adjust clothing after toileting   Toileting Assistive Devices: Grab  bar or rail  Toileting assist Assist level: Supervision or verbal cues   Transfers Chair/bed transfer   Chair/bed transfer method: Ambulatory Chair/bed transfer assist level: No Help, no cues, assistive device, takes more than a reasonable amount of time       Locomotion Ambulation     Max distance: 300' Assist level: Supervision or verbal cues   Wheelchair          Cognition Comprehension Comprehension assist level: Follows complex conversation/direction with no assist  Expression Expression assist level: Expresses complex ideas: With no assist  Social Interaction Social Interaction assist level: Interacts appropriately with others - No medications needed.  Problem Solving Problem solving assist level: Solves complex problems: Recognizes & self-corrects  Memory Memory assist level: Complete Independence: No helper    Medical Problem List and Plan: 1. 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 DVT Prophylaxis/Anticoagulation: Pharmaceutical:Lovenox 3. Pain Management:tylenol prn 4. Mood:LCSW to follow for evaluation and support. 5. Neuropsych: This patientiscapable of making decisions on hisown behalf. 6. Skin/Wound Care:routine pressure relief measures 7. Fluids/Electrolytes/Nutrition:Monitor I/O.    BMP within acceptable range on 12/31 8. Constipation: Augment bowel regimen 9. Small PFO:To follow with Dr. Excell Seltzerooper on outpatient basis--may need closure ifsource crytogenic.  10. Anxiety disorder: continue Prozac. 11. Transaminitis   LFTS elevated on 12/27, repeat WNL on 12/31   Cont to monitor 12. AKI  Encourage fluids 13. Elevated blood glucose  Will need ambulatory follow up  LOS (Days) 4 A FACE TO FACE EVALUATION WAS PERFORMED  Lacee Grey Karis Jubanil Montrel Donahoe 04/21/2017 9:00 AM

## 2017-04-21 NOTE — Progress Notes (Signed)
Patient discharged home.  Left floor via wheelchair, escorted by nursing staff and spouse.  Patient and spouse verbalized understanding of discharge instructions as given by Marissa NestlePam Love, PA.  All patient belongings sent with patient, including prescriptions.  Patient appears to be in no immediate distress at this time.  Dani Gobbleeardon, Shalona Harbour J, RN

## 2017-04-21 NOTE — Consult Note (Signed)
Referring MD: Dr Marca Anconaalton McLean  PCP:  Martha ClanShaw, William, MD  Hematology consultation  Reason for Referral:   Advice on anticoagulation in a young man who has suffered a cryptogenic stroke  41 year old man in overall excellent health without any major medical or surgical illness.  He woke up from a nap, noticed that he had overslept, stood up abruptly, felt lightheaded, went to the bathroom, developed vertigo and ataxia and fell backwards.  He developed nausea, vomiting, and nystagmus.  He called his wife at home to assist him.  He was taken to the hospital where initially no focal neurologic deficits were detected and a CT brain was normal and he was discharged.  Later in the day he began to develop numbness first on the lateral aspect of his nose on the right and then progressive numbness of the right side of his face.  He came back to the hospital.  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  This resulted in nonhemorrhagic subcentimeter right inferior cerebellar and right medullary infarcts.  MR angiogram showed no additional findings.  Internal carotid arteries widely patent.  Normal anterior and middle cerebral artery flow and appearance.  Family history most interesting for 2 things: His sister now in her 8740s had a pulmonary embolus when she was in college.  Only abnormal lab finding was an elevated clotting factor VIII activity.  At age 40, she had a coronary thrombosis not felt to be a coronary artery dissection.  Further evaluation revealed elevation of lipoprotein a.  She is on lipid-lowering drugs and anticoagulation.  The patient was also found to have elevated lipoprotein a and was started on Crestor.  His mother had phlebitis in her legs.  She died 2 years ago of complications of pneumonia and hepatitis C.  A maternal grandfather died at age 40 of an MI.  Father alive  and well at age 40.  He has no other siblings.  He has a son age 16 with pyruvate kinase deficiency who has required poly-transfusion until age 41 when he underwent a splenectomy.  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  Another daughter age 40 and a son 8 are healthy.  Extended hypercoagulation profile done while hospitalized shows that he tests negative for antiphospholipid antibodies, protein S, C, anti-thrombin deficiencies, negative for mutations in the factor V Leiden and prothrombin genes, normal plasma homocystine.  HPI:   Past Medical History:  Diagnosis Date  . Anxiety   . CVA (cerebral vascular accident) [facility](HCC)   :  Past Surgical History:  Procedure Laterality Date  . TEE WITHOUT CARDIOVERSION N/A 04/14/2017   Procedure: TRANSESOPHAGEAL ECHOCARDIOGRAM (TEE);  Surgeon: Dolores PattyBensimhon, Daniel R, MD;  [facility];  Service: Cardiovascular;  Laterality: N/A;  :  . aspirin  325 mg Oral Daily  . clopidogrel  75 mg Oral Daily  . FLUoxetine  20 mg Oral Daily  . multivitamin with minerals  1 tablet Oral Daily  . rosuvastatin  20 mg Oral q1800  . senna-docusate  2 tablet Oral QHS  :  Allergies  Allergen Reactions  . Penicillins Hives    Has patient had a PCN reaction causing immediate rash, facial/tongue/throat swelling, SOB or lightheadedness with hypotension: YES Has patient had a PCN reaction causing severe rash involving mucus membranes or skin necrosis: NO Has patient had a PCN reaction that required  hospitalizationNO Has patient had a PCN reaction occurring within the last 10 years: NO If all of the above answers are "NO", then may proceed with Cephalosporin use.  : He had a severe allergic reaction to propofol during this admission with sudden onset of neurologic problems and sudden aphasia temporally related to the injection which was then terminated.  Family History  Problem Relation Age of Onset  . Other Sister        hypercoagulable state with  SCAD  . CAD Maternal Grandfather   : Above information is inaccurate.  Patient's sister was not felt to have SCAD she was found to have elevated lipoprotein A.  Social History   Socioeconomic History  . Marital status:  Married    Spouse name: Not on file  . Number of children:  3  . Years of education: Not on file  . Highest education level: Not on file  Social Needs  . Financial resource strain: Not on file  . Food insecurity - worry: Not on file  . Food insecurity - inability: Not on file  . Transportation needs - medical: Not on file  . Transportation needs - non-medical: Not on file  Occupational History  .  He sells commercial real estate  Tobacco Use  . Smoking status: Former Smoker    Years: 20.00    Types: Cigarettes    Last attempt to quit: 01/15/2016    Years since quitting: 1.2  . Smokeless tobacco: Former NeurosurgeonUser    Types: Chew  Substance and Sexual Activity  . Alcohol use: Yes    Alcohol/week: 12.0 oz    Types: 20 Cans of beer per week    Comment: occasonal  . Drug use: No  . Sexual activity: Not on file  Other Topics Concern  . Not on file  Social History Narrative  . Not on file  :  ROS: He is still experiencing some diplopia.  He has developed decreased sensation to hot and cold on his entire left side.  He is having some pain intermittently at the base of his neck.  He still feels that he is unsteady on his feet and like he might fall backwards again.   He is not double jointed.  No signs of Marfan's or suspicion of any other mucopolysaccharidosis.  Vitals: Vitals:   04/21/17 0544 04/21/17 1317  BP: (!) 126/93 (!) 145/86  Pulse: 72 79  Resp: 18 16  Temp: 97.9 F (36.6 C) 97.9 F (36.6 C)  SpO2: 99% 98%    PHYSICAL EXAM: General appearance: Well-nourished Caucasian man HEENT: Pharynx no erythema or exudate.  Neck is supple. Lymph Nodes: No cervical, supraclavicular, or axillary adenopathy Resp: Lungs clear to auscultation and resonant to  percussion Cardio: Regular cardiac rhythm no murmur gallop or rub.  No click. Vascular: Carotids 2+.  No bruits.  Radial pulses 2+ symmetric.  Dorsalis pedis pulses 2+ symmetric, posterior tibial pulses 1+ symmetric. Breasts: GI: Abdomen is soft and nontender without mass or organomegaly GU: Musculoskeletal: No joint deformities.  No hyperextensibility of his joints. Extremities: No edema, no calf tenderness Neurologic: Alert, awake, oriented x3, pupils equal round reactive to light, no nystagmus, visual fields normal by direct confrontation, cranial nerves II through XII normal, palate elevates symmetrically, no facial asymmetry, full extraocular movements, motor strength 5/5 upper and lower extremities, upper body coordination, rapid alternating movements, finger to nose, finger to hand, all normal.  Reflexes 2+ symmetric at the knees and at the biceps.  He  is able to identified the denomination of a quarter placed in his left hand.  Sensation intact to light touch over the face and extremities.  Hot/cold sensation not tested.  Gait not tested. Skin: No rash or ecchymosis  Labs: On December 20: Hemoglobin 15.4, hematocrit 44, white count 6100, 72 neutrophils, 20 lymphocytes, 7 monocytes, 1 eosinophil, platelets 159,000. No results for input(s): WBC, HGB, HCT, PLT in the last 72 hours. Recent Labs    04/21/17 0742  NA 138  K 3.8  CL 107  CO2 26  GLUCOSE 186*  BUN 14  CREATININE 1.04  CALCIUM 8.9  On December 21: Liver functions normal with total bilirubin 0.6, alkaline phosphatase 49, AST 21, ALT 20, albumin 3.6, total protein slightly decreased at 5.7 g% Cholesterol 141, HDL 71, LDL 57, triglycerides 65    Images Studies/Results: Cranial imaging as per HPI.  Transthoracic echo cardiogram normal.  Normal myocardial function.  Normal valves.  Transesophageal echo done result not yet reported  Transcranial Doppler study: Small left to right shunt consistent with a small  PFO  Venous ultrasound studies of the lower extremities with no thromboses.   Impression: Cryptogenic stroke in a 41 year old otherwise healthy man with a interesting family history, in particular his sister and only sibling who had a pulmonary embolism in her 8s felt related to elevated factor VIII activity and a subsequent coronary thrombosis possibly related to elevated apoprotein A levels. See HPI.   Expanded hypercoagulation panel is negative, in particular, no evidence for the presence of antiphospholipid antibodies.  A factor VIII activity is pending. He has a small PFO which his cardiologist feels is insignificant and not likely the etiology of his stroke or in need of closure.  I reviewed the cranial images with the neuroradiologist.  It is possible but not definitive, that he has an anatomic reason for the stroke secondary to  a vertebral artery dissection since the point of occlusion of his right vertebral artery is in the V4 segment which is a common place for a dissection to start.  He has a normal complete blood count so there is no reason to suspect a myeloproliferative disorder or PNH.  Recommendation: He has been started on dual antiplatelet agents with aspirin and Plavix.  I think that this is quite reasonable and the standard of care at this point in time.  Given his young age and low bleeding risk, I would strongly consider extending dual antiplatelet agents for longer than 3 months. Most of the congenital clotting abnormalities that we see in adults including factor V Leiden, prothrombin gene mutations, and factor VIII mutations provoke almost exclusively venous thrombotic events and not arterial circulation events except in anecdotal reports.  Therefore, I do not feel that even if his factor VIII level comes back elevated that I would change his management with respect to use of a anticoagulant.  In addition, even if the factor VIII level does come back elevated at this time,  it may just reflect an acute phase reaction and would need to be followed over time and remain 2 standard deviations above control to be a risk factor for recurrent thrombosis.   If an anticoagulant would be indicated in the future, warfarin would be the drug of choice.  Experience with the newer oral anticoagulants suggests that they are not as effective in preventing recurrent arterial events.  My above impression was discussed with the patient, his wife, and his cardiologist. He is scheduled for a evaluation by Dr.  Cherie Dark, Professor of hematology at [facility], later this month and I encouraged him to keep this appointment.  Thereare certainly a number of provocative things about his history to suggest that he may have a previously undescribed coagulopathy that would best be explored at the [facility] level.            Levert Feinstein 04/21/2017, 1:44 PM

## 2017-04-21 NOTE — Progress Notes (Signed)
Occupational Therapy Discharge Summary  Patient Details  Name: Jake Moore MRN: [id number] Date of Birth: Feb 08, 1976   Patient has met 8 of 8 long term goals due to improved balance, postural control and ability to compensate for deficits.  Patient to discharge at overall Independent level.  Patient's care partner is independent to provide the necessary physical assistance at discharge with complex IADLs and driving.  Reasons goals not met: n/a  Recommendation:  No further OT services recommended. Pt will be receiving outpt PT for vestibular rehab.  Equipment: No equipment provided  Reasons for discharge: treatment goals met  Patient/family agrees with progress made and goals achieved: Yes  OT Discharge Precautions/Restrictions  Precautions Precautions: Fall Restrictions Weight Bearing Restrictions: No ADL ADL ADL Comments: see functional navigator - pt is fully independent with his BADLs; driving is not recommended at this time Vision Baseline Vision/History: Wears glasses(pt now has L medial side of lenses taped to occlude L eye vision when looking to the R field to avoid diplopia) Wears Glasses: Reading only Patient Visual Report: Eye fatigue/eye pain/headache;Nausea/blurring vision with head movement;Diplopia Eye Alignment: Impaired (comment)(impaired only when B eyes are gazing in R visual field, slight misalignment) Ocular Range of Motion: Within Functional Limits Alignment/Gaze Preference: Within Defined Limits Tracking/Visual Pursuits: Able to track stimulus in all quads without difficulty Visual Fields: No apparent deficits Diplopia Assessment: Disappears with one eye closed;Objects split side to side;Present in far gaze;Only with right gaze;Objects split on top of one another Perception  Perception: Within Functional Limits Praxis Praxis: Intact Cognition Overall Cognitive Status: Within Functional Limits for tasks assessed Arousal/Alertness:  Awake/alert Orientation Level: Oriented X4 Focused Attention: Appears intact Sustained Attention: Appears intact Selective Attention: Appears intact Memory: Appears intact Awareness: Appears intact Problem Solving: Appears intact Safety/Judgment: Appears intact Sensation Sensation Light Touch: Impaired Detail Light Touch Impaired Details: Impaired LUE;Impaired LLE(and R side of face) Hot/Cold Impaired Details: Impaired LLE;Impaired LUE(Impaired to R side of face) Coordination Gross Motor Movements are Fluid and Coordinated: No Fine Motor Movements are Fluid and Coordinated: Yes Motor  Motor Motor: Ataxia;Abnormal postural alignment and control Mobility    mod I with ADL transfers Trunk/Postural Assessment  Cervical Assessment Cervical Assessment: Within Functional Limits Thoracic Assessment Thoracic Assessment: Within Functional Limits Lumbar Assessment Lumbar Assessment: Within Functional Limits Postural Control Trunk Control: biasis lean to the right   Balance Balance Balance Assessed: Yes Berg Balance Test Sit to Stand: Able to stand without using hands and stabilize independently Standing Unsupported: Able to stand safely 2 minutes Sitting with Back Unsupported but Feet Supported on Floor or Stool: Able to sit safely and securely 2 minutes Stand to Sit: Sits safely with minimal use of hands Transfers: Able to transfer safely, minor use of hands Standing Unsupported with Eyes Closed: Able to stand 10 seconds safely Standing Ubsupported with Feet Together: Able to place feet together independently and stand 1 minute safely From Standing, Reach Forward with Outstretched Arm: Can reach confidently >25 cm (10") From Standing Position, Pick up Object from Floor: Able to pick up shoe safely and easily From Standing Position, Turn to Look Behind Over each Shoulder: Looks behind from both sides and weight shifts well Turn 360 Degrees: Able to turn 360 degrees safely in 4  seconds or less Standing Unsupported, Alternately Place Feet on Step/Stool: Able to stand independently and safely and complete 8 steps in 20 seconds Standing Unsupported, One Foot in Front: Loses balance while stepping or standing Standing on One Leg: Unable to try  or needs assist to prevent fall Total Score: 48 Dynamic Gait Index Level Surface: Normal Change in Gait Speed: Mild Impairment Gait with Horizontal Head Turns: Mild Impairment Gait with Vertical Head Turns: Normal Gait and Pivot Turn: Normal Step Over Obstacle: Mild Impairment Step Around Obstacles: Normal Steps: Normal Total Score: 21 Static Sitting Balance Static Sitting - Level of Assistance: 6: Modified independent (Device/Increase time) Dynamic Sitting Balance Dynamic Sitting - Level of Assistance: 6: Modified independent (Device/Increase time) Static Standing Balance Static Standing - Level of Assistance: 6: Modified independent (Device/Increase time) Dynamic Standing Balance Dynamic Standing - Level of Assistance: 6: Modified independent (Device/Increase time) Extremity/Trunk Assessment RUE Assessment RUE Assessment: Within Functional Limits LUE Assessment LUE Assessment: Within Functional Limits   See Function Navigator for Current Functional Status.  [place] 04/21/2017, 10:23 AM

## 2017-04-21 NOTE — Progress Notes (Signed)
Physical Therapy Session Note  Patient Details  Name: Jake BameRobert F Moore MRN: [id number] Date of Birth: 08/09/1975  Today's Date: 04/21/2017 PT Individual Time: 1130-1154 PT Individual Time Calculation (min): 24 min   Short Term Goals: Week 1:  PT Short Term Goal 1 (Week 1): =LTGs due to ELOS  Skilled Therapeutic Interventions/Progress Updates:    no c/o pain.  Session focus on sit<>stands, and standing balance during cognitive task.   Pt ambulates throughout unit mod I.  Sit<>stand on 6" foam wedge with mod I x5 and pt completes moderate difficulty dog-pile puzzle with min cues and increased time for problem solving.    Pt returned to room at end of session.   Therapy Documentation Precautions:  Precautions Precautions: Fall Precaution Comments: pt reports increased fall/lean tendency to R side Restrictions Weight Bearing Restrictions: No   See Function Navigator for Current Functional Status.   Therapy/Group: Individual Therapy  Stephania FragminCaitlin E Celisse Ciulla 04/21/2017, 11:55 AM

## 2017-04-23 LAB — FACTOR 8 ASSAY: Coagulation Factor VIII: 175 % — ABNORMAL HIGH (ref 57–163)

## 2017-04-23 NOTE — Progress Notes (Signed)
Social Work Discharge Note  The overall goal for the admission was met for:   Discharge location: [place]  Length of Stay: Yes-4 DAYS  Discharge activity level: Yes-MOD/I LEVEL  Home/community participation: Yes  Services provided included: MD, RD, PT, OT, SLP, RN, CM, Pharmacy and SW  Financial Services: Private Insurance: [place]  Follow-up services arranged: Outpatient: [facility]-PT 1/4 @ 8:00 AM  Comments (or additional information):PT RECOVERED QUICKLY AND WAS READY TO GO HOME WITH WIFE IN 4 DAYS.  Patient/Family verbalized understanding of follow-up arrangements: Yes  Individual responsible for coordination of the follow-up plan: SELF & CRAIG-WIFE  Confirmed correct DME delivered: Elease Hashimoto 04/23/2017    Elease Hashimoto

## 2017-04-23 NOTE — Patient Care Conference (Signed)
Inpatient RehabilitationTeam Conference and Plan of Care Update Date: 04/21/2017   Time: 9:56 AM    Patient Name: Jake BameRobert F Kleckner      Medical Record Number: [id number]  Date of Birth: 10/14/1975 Sex: Male         Room/Bed: 4M04C/4M04C-01 Payor Info: Payor: BLUE CROSS BLUE SHIELD / Plan: BCBS OTHER / Product Type: *No Product type* /    Admitting Diagnosis: r cva  Admit Date/Time:  04/17/2017  3:10 PM Admission Comments: No comment available   Primary Diagnosis:  Stroke, Wallenberg's syndrome Principal Problem: Stroke, Wallenberg's syndrome  Patient Active Problem List   Diagnosis Date Noted  . AKI (acute kidney injury) (HCC)   . Blood glucose elevated   . Generalized anxiety disorder   . Transaminitis   . CVA (cerebral vascular accident) (HCC) 04/17/2017  . 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 Altered sensation due to recent stroke   . Anxiety state   . Attention deficit disorder   . Hypokalemia   . Stroke, Wallenberg's syndrome   . Stroke (HCC) 04/11/2017  . Hyperlipidemia 04/11/2017  . 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 Anxiety     Expected Discharge Date: Expected Discharge Date: 04/21/17  Team Members Present: Physician leading conference: Dr. 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  modified independent for functional transfers.  Still with wooziness rated at 4 on a 10 point scale.  Diplopia still present but only in the right far  lateral field.    modified independent  balance retraining, transfer retraining, vestibular exercises, functional mobility, pt/family education   Mobility     mod/i-will need OP follow up   mod/i     Communication             Safety/Cognition/ Behavioral Observations            Pain     no issues        Skin     no issues           *See Care Plan and progress notes for long and short-term goals.     Barriers to Discharge  Current Status/Progress Possible Resolutions Date Resolved   Physician    Medical stability     See above  Labs improving, follow as outpt, progressing      Nursing                  PT                    OT                  SLP                SW                Discharge Planning/Teaching Needs:  Home with wife who has been here daily and participated in therapies with pt.     Team Discussion:  Goals mod/i level. Making daily progress and recovery. Less blurred vision and vision clearing. Wife can provide 24 hr supervision. Ready for DC with OP PT only  Revisions to Treatment Plan:  DC 12/31    Continued Need for Acute Rehabilitation Level of Care: The patient requires daily medical management by a physician with specialized training in physical medicine and rehabilitation for the following conditions: Daily direction of a multidisciplinary physical rehabilitation program to ensure safe treatment while eliciting the highest outcome that is of practical value to the patient.: Yes Daily medical management of patient stability for increased activity during participation in an intensive rehabilitation regime.: Yes Daily analysis of laboratory values and/or radiology reports with any subsequent need for medication adjustment of medical intervention for : Neurological problems  Jaber Dunlow, Lemar Livings 04/23/2017, 9:56 AM

## 2017-04-24 ENCOUNTER — Ambulatory Visit: Payer: BLUE CROSS/BLUE SHIELD

## 2017-04-24 DIAGNOSIS — R779 Abnormality of plasma protein, unspecified: Secondary | ICD-10-CM | POA: Diagnosis not present

## 2017-04-24 DIAGNOSIS — I6509 Occlusion and stenosis of unspecified vertebral artery: Secondary | ICD-10-CM | POA: Diagnosis not present

## 2017-04-24 DIAGNOSIS — Q211 Atrial septal defect: Secondary | ICD-10-CM | POA: Diagnosis not present

## 2017-04-24 DIAGNOSIS — Z8673 Personal history of transient ischemic attack (TIA), and cerebral infarction without residual deficits: Secondary | ICD-10-CM | POA: Diagnosis not present

## 2017-04-24 LAB — FACTOR 8 ASSAY: Coagulation Factor VIII: 146 % (ref 57–163)

## 2017-04-25 ENCOUNTER — Ambulatory Visit: Payer: BLUE CROSS/BLUE SHIELD | Attending: Physical Medicine and Rehabilitation | Admitting: Physical Therapy

## 2017-04-25 ENCOUNTER — Encounter: Payer: Self-pay | Admitting: Physical Therapy

## 2017-04-25 DIAGNOSIS — R278 Other lack of coordination: Secondary | ICD-10-CM | POA: Diagnosis not present

## 2017-04-25 DIAGNOSIS — R2689 Other abnormalities of gait and mobility: Secondary | ICD-10-CM | POA: Diagnosis not present

## 2017-04-25 DIAGNOSIS — R29818 Other symptoms and signs involving the nervous system: Secondary | ICD-10-CM | POA: Diagnosis not present

## 2017-04-25 DIAGNOSIS — R42 Dizziness and giddiness: Secondary | ICD-10-CM | POA: Diagnosis not present

## 2017-04-26 NOTE — Therapy (Signed)
[facility] [address] [place], [place], [address] Phone: [phone]   Fax:  [phone]  Physical Therapy Evaluation  Patient Details  Name: Jake Moore MRN: [id number] Date of Birth: [birth year] Referring Provider: Delle Reining PA; Maryla Morrow MD   Encounter Date: 04/25/2017  PT End of Session - 04/26/17 0932    Visit Number  1    Number of Visits  9    Date for PT Re-Evaluation  06/25/17    Authorization Type  pt reports has HSA/high deductible plan;  no visit limits    PT Start Time  0755    PT Stop Time  0845    PT Time Calculation (min)  50 min    Activity Tolerance  Patient tolerated treatment well    Behavior During Therapy  [facility] for tasks assessed/performed       Past Medical History:  Diagnosis Date  . Anxiety   . CVA (cerebral vascular accident) [facility])     Past Surgical History:  Procedure Laterality Date  . TEE WITHOUT CARDIOVERSION N/A 04/14/2017   Procedure: TRANSESOPHAGEAL ECHOCARDIOGRAM (TEE);  Surgeon: Dolores Patty, MD;  Location: [facility] ENDOSCOPY;  Service: Cardiovascular;  Laterality: N/A;    There were no vitals filed for this visit.   Subjective Assessment - 04/25/17 0804    Subjective  Still have my balance and coordination issues. Double vision is getting better, but still there. Pain neck (base of skull on rt side), rt side of head, and pain in rt ear.     Pertinent History  anxiety/ADD, hyperlipidemia     How long can you stand comfortably?  15 minutes    How long can you walk comfortably?  less than a block    Patient Stated Goals  Want to be able to drive and not feel as dizzy; able exercise again (walking, running, elliptical, weights)    Currently in Pain?  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   Yes    Is the patient reluctant to leave their home because of a fear of falling?   No      Home Environment   Living Environment  Private residence    Living Arrangements  Spouse/significant other;Children 3 kids 6, 8, 10    Available Help at Discharge  Family;Available  24 hours/day father, in laws, wife does not work    Type of Home  House    Home Access  Stairs to enter    [employer] of Steps  3    Entrance Stairs-Rails  None    Home Layout  Able to live on main level with bedroom/bathroom      Prior Function   Level of Independence  Independent    Vocation  Full time employment    Economist    Leisure  working out, play with kids, golf       Cognition   Overall Cognitive Status  Within Functional Limits for tasks assessed      Observation/Other Assessments   Focus on Therapeutic Outcomes (FOTO)   FS 53 (risk adjusted 65)      Sensation   Light Touch  Impaired by gross assessment LLE decreased 75% compared to RLE    Hot/Cold  Impaired Detail    Hot/Cold Impaired Details  Absent LUE;Absent LLE    Proprioception  Impaired Detail    Proprioception Impaired Details  Impaired LLE unable to accurately "place" in same position as RLE    Additional Comments  numbness rt face, left arm and leg (mostly  decreased temperature)      Coordination   Gross Motor Movements are Fluid and Coordinated  No    Fine Motor Movements are Fluid and Coordinated  Yes    Coordination and Movement Description  rapid toe taps synchronous    Heel Shin Test  slight dyscoordination LLE      ROM / Strength   AROM / PROM / Strength  AROM;Strength      AROM   Overall AROM   Within functional limits for tasks performed      Strength   Overall Strength  Within functional limits for tasks performed      Transfers   Transfers  Sit to Stand;Stand to Sit    Sit to Stand  6: Modified independent (Device/Increase time)    Stand to Sit  6: Modified independent (Device/Increase time)    Comments  incr time due to perceived imbalance      Ambulation/Gait   Ambulation/Gait  Yes    Ambulation/Gait Assistance  4: Min guard    Ambulation Distance (Feet)  80 Feet 150, 80    Assistive device  None    Gait Pattern  Step-through pattern;Decreased weight shift to right;Narrow base of support    Ambulation Surface  Indoor    Gait velocity  32.8 ft/7.94 sec=4.13 ft/sec 32.8 ft/5.93 sec= 5.53 ft/sec (fastest safe pace)    Gait Comments  pt with staggering to his left (>12") multiple times during testing; reports he feels he is leaning to the right and posterior      Standardized Balance Assessment   Standardized Balance Assessment  Timed Up and Go Test      Timed Up and Go Test   Normal TUG (seconds)  8.09    Cognitive TUG (seconds)  7.88      Functional Gait  Assessment   Gait assessed   Yes    Gait Level Surface  Walks 20 ft in less than 5.5 sec, no assistive devices, good speed, no evidence for imbalance, normal gait pattern, deviates no more than 6 in outside of the 12 in walkway width. 4.0    Change in Gait Speed  Able to smoothly change walking speed without loss of balance or gait deviation.  Deviate no more than 6 in outside of the 12 in walkway width.    Gait with Horizontal Head Turns  Performs head turns with  moderate changes in gait velocity, slows down, deviates 10-15 in outside 12 in walkway width but recovers, can continue to walk.    Gait with Vertical Head Turns  Performs task with slight change in gait velocity (eg, minor disruption to smooth gait path), deviates 6 - 10 in outside 12 in walkway width or uses assistive device    Gait and Pivot Turn  Pivot turns safely within 3 sec and stops quickly with no loss of balance.    Step Over Obstacle  Is able to step over 2 stacked shoe boxes taped together (9 in total height) without changing gait speed. No evidence of imbalance.    Gait with Narrow Base of Support  Ambulates 4-7 steps.    Gait with Eyes Closed  Cannot walk 20 ft without assistance, severe gait deviations or imbalance, deviates greater than 15 in outside 12 in walkway width or will not attempt task.    Ambulating Backwards  Walks 20 ft, uses assistive device, slower speed, mild gait deviations, deviates 6-10 in outside 12 in walkway width.    Steps  Alternating feet, no rail.    Total Score  21             Objective measurements completed on examination: See above findings.              PT Education - 04/26/17 0930    Education provided  Yes    Education Details  results of evaluation, goals for PT and POC, continue gaze stability exercises from CIR (pt demonstrated VORx1, VORx2) with goal of 60 seconds max and increasing speed of movement    Person(s) Educated  Patient    Methods  Explanation    Comprehension  Verbalized understanding          PT Long Term Goals - 04/26/17 0949      PT LONG TERM GOAL #1   Title  Patient will be independent with HEP and able to verbalize plans for return to community-based exercise program. (Target all LTGs 05/25/17)    Time  4    Period  Weeks    Status  New    Target Date  05/25/17      PT LONG TERM GOAL #2   Title  Patient will demonstrate lesser fall risk with score of >22 on FGA.    Baseline  04/25/17   21/30     Time  4    Period  Weeks    Status  New      PT LONG TERM GOAL #3   Title  Patient will ambulate independently >1000 ft over level, unlevel outdoor surfaces with <6 inch drift off straight path.     Time  4    Period  Weeks    Status  New      PT LONG TERM GOAL #4   Title  Patient will score >=52 on Berg to indicate lesser fall risk.    Baseline  48 on d/c from CIR    Time  4    Period  Weeks    Status  New             Plan - 04/26/17 0934    Clinical Impression Statement  Patient referred for OPPT s/p Rt medulla and Rt inferior cerebellar CVAs with resulting balance and gait  impairments. Prior to CVA, patient very physically active (working out 3+ times per week, works full-time and spending time with family including 3 young children) and currently unable to resume his active lifestyle due to imbalance and decreased coordination. Patient can benefit from PT to address the deficits listed below via the interventions listed below.     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; +PFO, ?hypercoaguable state (further workup pending); anxiety/ADD, hyperlipidemia    Clinical Presentation  Evolving    Clinical Presentation due to:  Medical work-up/diagnostics ongoing to discern cause of CVAs    Clinical Decision Making  Moderate    Rehab Potential  Excellent    PT Frequency  2x / week    PT Duration  4 weeks    PT Treatment/Interventions  ADLs/Self Care Home Management;Gait training;Neuromuscular re-education;Balance training;Therapeutic exercise;Therapeutic activities;Functional mobility training;Stair training;Patient/family education;Vestibular;Visual/perceptual remediation/compensation    PT Next Visit Plan  complete vestibular assessment and update HEP    Recommended Other Services  ?OT evaluation       Patient will  benefit from skilled therapeutic intervention in order to improve the following deficits and impairments:  Abnormal gait, Decreased balance, Decreased mobility, Decreased coordination, Dizziness, Impaired sensation, Impaired vision/preception, Impaired UE functional use  Visit Diagnosis: Other abnormalities of gait and mobility - Plan: PT plan of care cert/re-cert,  Dizziness and giddiness - Plan: PT plan of care cert/re-cert,   Other lack of coordination - Plan: PT plan of care cert/re-cert,   Other symptoms and signs involving the nervous system - Plan: PT plan of care cert/re-cert,      Problem List Patient Active Problem List   Diagnosis Date Noted  . AKI (acute kidney injury) (HCC)   . Blood glucose elevated   . Generalized anxiety disorder   . Transaminitis   . CVA (cerebral vascular accident) (HCC) 04/17/2017  . 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 Altered sensation due to recent stroke   . Anxiety state   . Attention deficit disorder   . Hypokalemia   . Stroke, Wallenberg's syndrome   . Stroke (HCC) 04/11/2017  . Hyperlipidemia 04/11/2017  . 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 Anxiety     Zena AmosLynn P Robertta Halfhill, PT 04/26/2017, 10:16 AM  [facility] [address] [place], [place], [address] Phone: [phone]   Fax:  [phone]  Name: Jake Moore MRN: [id number] Date of Birth: 05/19/1975

## 2017-04-28 ENCOUNTER — Encounter: Admit: 2017-04-28 | Discharge: 2017-04-29 | Payer: PRIVATE HEALTH INSURANCE

## 2017-04-28 ENCOUNTER — Encounter
Admit: 2017-04-28 | Discharge: 2017-04-29 | Payer: PRIVATE HEALTH INSURANCE | Attending: Hematology & Oncology | Primary: Hematology & Oncology

## 2017-04-28 DIAGNOSIS — I6509 Occlusion and stenosis of unspecified vertebral artery: Principal | ICD-10-CM

## 2017-04-28 DIAGNOSIS — R791 Abnormal coagulation profile: Secondary | ICD-10-CM

## 2017-04-28 DIAGNOSIS — Z8673 Personal history of transient ischemic attack (TIA), and cerebral infarction without residual deficits: Principal | ICD-10-CM

## 2017-04-28 DIAGNOSIS — E7841 Elevated Lipoprotein(a): Secondary | ICD-10-CM

## 2017-04-28 DIAGNOSIS — Q211 Atrial septal defect: Secondary | ICD-10-CM

## 2017-04-28 DIAGNOSIS — I639 Cerebral infarction, unspecified: Secondary | ICD-10-CM | POA: Diagnosis not present

## 2017-04-28 DIAGNOSIS — I63211 Cerebral infarction due to unspecified occlusion or stenosis of right vertebral arteries: Secondary | ICD-10-CM | POA: Diagnosis not present

## 2017-04-29 ENCOUNTER — Ambulatory Visit: Payer: BLUE CROSS/BLUE SHIELD | Admitting: Physical Therapy

## 2017-04-29 ENCOUNTER — Encounter: Payer: Self-pay | Admitting: Physical Therapy

## 2017-04-29 DIAGNOSIS — R42 Dizziness and giddiness: Secondary | ICD-10-CM | POA: Diagnosis not present

## 2017-04-29 DIAGNOSIS — R29818 Other symptoms and signs involving the nervous system: Secondary | ICD-10-CM

## 2017-04-29 DIAGNOSIS — R2689 Other abnormalities of gait and mobility: Secondary | ICD-10-CM

## 2017-04-29 DIAGNOSIS — R278 Other lack of coordination: Secondary | ICD-10-CM | POA: Diagnosis not present

## 2017-04-29 NOTE — Patient Instructions (Addendum)
Gaze Stabilization: Tip Card  1.Target must remain in focus, not blurry, and appear stationary while head is in motion. 2.Perform exercises with small head movements (45 to either side of midline). 3.Increase speed of head motion so long as target is in focus. 4.If you wear eyeglasses, be sure you can see target through lens (therapist will give specific instructions for bifocal / progressive lenses). 5.These exercises should provoke dizziness or nausea. Work through these symptoms. If too dizzy, slow head movement slightly. Rest between each exercise. 6.Exercises demand concentration; avoid distractions.  Copyright  VHI. All rights reserved.     Special Instructions: Exercises should bring on mild to moderate symptoms of dizziness (should stay less than a 5) that resolve within 30 minutes of completing exercises. If symptoms are lasting longer than 30 minutes, modify your exercises by:  >decreasing the # of times you complete each activity >ensuring your symptoms return to baseline before moving onto the next exercise >dividing up exercises so you do not do them all in one session, but multiple short sessions throughout the day >doing them once a day until symptoms improve   Gaze Stabilization: Sitting    Keeping eyes on target on wall 2-3 feet away, and move head side to side for _60___ seconds. Repeat while moving head up and down for __30-60__ seconds. Repeat each direction 3 times.  Do __2-3__ sessions per day.  Copyright  VHI. All rights reserved.    Gaze Stabilization - Tip Card  For safety, perform standing exercises close to a counter, wall, corner, or next to someone.   Gaze Stabilization - Standing Feet Apart   Feet shoulder width apart, keeping eyes on target on wall 3 feet away, tilt head down slightly and move head side to side for 30 seconds. Repeat while moving head up and down for 30 seconds. *Work up to tolerating 60 seconds, as able. Do 2-3 sessions per  day.   Copyright  VHI. All rights reserved.   Compensatory Strategies: Corrective Saccades    1. Holding two stationary targets placed __8__ inches apart, move eyes to target, keep head still. 2. Then move head in direction of target while eyes remain on target. 3/4. Repeat in opposite direction. Perform standing. Repeat sequence 60 seconds per session. Repeat 3 times. Do __2-3__ sessions per day.  Copyright  VHI. All rights reserved.   Feet Apart (Compliant Surface) Head Motion - Eyes Open    With eyes open, standing on compliant surface: feet shoulder width apart, move head slowly: up and down x 10 reps. Left and right x 10, then each diagonal x 10.  Do __1-2__ sessions per day.  Copyright  VHI. All rights reserved.    Feet Apart, Head Motion - Eyes Closed    With eyes closed and feet shoulder width apart, stand for 30-60 seconds. Gradually move feet closer together to increase challenge. Repeat __3__ times per session. Do ___1-2_ sessions per day.  Copyright  VHI. All rights reserved.

## 2017-04-29 NOTE — Therapy (Signed)
[facility] [address] [place], [place], [address] Phone: [phone]   Fax:  [phone]  Physical Therapy Treatment  Patient Details  Name: Jake Moore MRN: [id number] Date of Birth: 15-Feb-1976 Referring Provider: Delle Reining PA; Maryla Morrow MD   Encounter Date: 04/29/2017  PT End of Session - 04/29/17 1114    Visit Number  2    Number of Visits  9    Date for PT Re-Evaluation  06/25/17    Authorization Type  pt reports has HSA/high deductible plan;  no visit limits    PT Start Time  1020    PT Stop Time  1105    PT Time Calculation (min)  45 min    Activity Tolerance  Patient tolerated treatment well    Behavior During Therapy  [facility] for tasks assessed/performed       Past Medical History:  Diagnosis Date  . Anxiety   . CVA (cerebral vascular accident) [facility])     Past Surgical History:  Procedure Laterality Date  . TEE WITHOUT CARDIOVERSION N/A 04/14/2017   Procedure: TRANSESOPHAGEAL ECHOCARDIOGRAM (TEE);  Surgeon: Dolores Patty, MD;  Location: [facility] ENDOSCOPY;  Service: Cardiovascular;  Laterality: N/A;    There were no vitals filed for this visit.  Subjective Assessment - 04/29/17 1024    Subjective  Mornings and evenings are worse; feels draggy/dizzy and loses balance. Denies double-vision except when very tired..     Pertinent History  anxiety/ADD, hyperlipidemia     How long can you stand comfortably?  15 minutes    How long can you walk comfortably?  less than a block    Patient Stated Goals  Want to be able to drive and not feel as dizzy; able exercise again (walking, running, elliptical, weights)    Pain Onset  1 to 4 weeks ago                       Vestibular Treatment/Exercise - 04/29/17 0001      Vestibular Treatment/Exercise   Vestibular Treatment Provided  Gaze    Gaze Exercises  X1 Viewing Horizontal;X1 Viewing Vertical;Eye/Head Exercise Horizontal      X1  Viewing Horizontal   Foot Position  sit, stand    Time  -- 30-45 seconds    Reps  3    Comments  dizziness up to 5/10; doubling image when target in rt field of vision      X1 Viewing Vertical   Foot Position  sit, stand    Time  -- 60 sec    Reps  3    Comments  no significant dizziness      Eye/Head Exercise Horizontal   Foot Position  stand, feet apart    Time  -- 30 sec    Reps  2    Comments  compensatory saccades, dizziness up to 5/10       Patient demonstrated exercises given from CIR (smooth pursuits, VORx2). Assessed convergence WNL.    Balance Exercises - 04/29/17 1110      Balance Exercises: Standing   Standing Eyes Opened  Narrow base of support (BOS);Head turns;Foam/compliant surface 10 reps horiz, vertical x 2 sets; each diagonal x 1 set    Standing Eyes Closed  Wide (BOA);Foam/compliant surface;Solid surface;30 secs foam LOB into wall <3 seconds; solid ~20 seconds    Gait with Head Turns  Forward horizontal with drifting right/overcorrects to left;  PT Education - 04/29/17 1113    Education provided  Yes    Education Details  reviewed pt's current vestibular exercises and replaced them for additional challenge; educated in head down 30 degrees with VOR and compensatory saccades; see additional HEP    Person(s) Educated  Patient    Methods  Explanation;Demonstration;Verbal cues;Handout    Comprehension  Verbalized understanding;Returned demonstration;Verbal cues required          PT Long Term Goals - 04/26/17 0949      PT LONG TERM GOAL #1   Title  Patient will be independent with HEP and able to verbalize plans for return to community-based exercise program. (Target all LTGs 05/25/17)    Time  4    Period  Weeks    Status  New    Target Date  05/25/17      PT LONG TERM GOAL #2   Title  Patient will demonstrate lesser fall risk with score of >22 on FGA.    Baseline  04/25/17   21/30    Time  4    Period  Weeks    Status  New      PT LONG  TERM GOAL #3   Title  Patient will ambulate independently >1000 ft over level, unlevel outdoor surfaces with <6 inch drift off straight path.     Time  4    Period  Weeks    Status  New      PT LONG TERM GOAL #4   Title  Patient will score >=52 on Berg to indicate lesser fall risk.    Baseline  48 on d/c from CIR    Time  4    Period  Weeks    Status  New            Plan - 04/29/17 1115    Clinical Impression Statement  Session focused on establishing vestibular rehab HEP. Patient continues with some double vision in right field of vision, but can merge into single vision with concentration (especially when less fatigued). Patient highly motivated and will continue to benefit from PT to improve safety/reduce risk of falling.     Rehab Potential  Excellent    PT Frequency  2x / week    PT Duration  4 weeks    PT Treatment/Interventions  ADLs/Self Care Home Management;Gait training;Neuromuscular re-education;Balance training;Therapeutic exercise;Therapeutic activities;Functional mobility training;Stair training;Patient/family education;Vestibular;Visual/perceptual remediation/compensation    PT Next Visit Plan  check any questions with HEP from 1/8; work on balance exercises, especially on compliant surface (?tandem walk, backwards, cone taps, etc); add balance exercises to HEP as appropriate       Patient will benefit from skilled therapeutic intervention in order to improve the following deficits and impairments:  Abnormal gait, Decreased balance, Decreased mobility, Decreased coordination, Dizziness, Impaired sensation, Impaired vision/preception, Impaired UE functional use  Visit Diagnosis: Dizziness and giddiness  Other abnormalities of gait and mobility  Other symptoms and signs involving the nervous system     Problem List Patient Active Problem List   Diagnosis Date Noted  . AKI (acute kidney injury) (HCC)   . Blood glucose elevated   . Generalized anxiety  disorder   . Transaminitis   . CVA (cerebral vascular accident) (HCC) 04/17/2017  . 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 Altered sensation due to recent stroke   . Anxiety state   . Attention deficit disorder   . Hypokalemia   . Stroke, Wallenberg's syndrome   .  Stroke (HCC) 04/11/2017  . Hyperlipidemia 04/11/2017  . 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 Anxiety     Zena Amos, PT 04/29/2017, 11:20 AM  [facility] [address] [place], [place], [address] Phone: [phone]   Fax:  [phone]  Name: Jake Moore MRN: [id number] Date of Birth: May 10, 1975

## 2017-04-30 ENCOUNTER — Ambulatory Visit: Payer: BLUE CROSS/BLUE SHIELD | Admitting: Physical Therapy

## 2017-04-30 ENCOUNTER — Encounter: Payer: Self-pay | Admitting: Physical Therapy

## 2017-04-30 DIAGNOSIS — R42 Dizziness and giddiness: Secondary | ICD-10-CM

## 2017-04-30 DIAGNOSIS — R278 Other lack of coordination: Secondary | ICD-10-CM

## 2017-04-30 DIAGNOSIS — R2689 Other abnormalities of gait and mobility: Secondary | ICD-10-CM | POA: Diagnosis not present

## 2017-04-30 DIAGNOSIS — F4322 Adjustment disorder with anxiety: Secondary | ICD-10-CM | POA: Diagnosis not present

## 2017-04-30 DIAGNOSIS — R29818 Other symptoms and signs involving the nervous system: Secondary | ICD-10-CM

## 2017-04-30 NOTE — Therapy (Signed)
[facility] [address] [place], [place], [address] Phone: [phone]   Fax:  [phone]  Physical Therapy Treatment  Patient Details  Name: Jake Moore MRN: [id number] Date of Birth: 03/17/1976 Referring Provider: Delle ReiningLove, Pamela PA; Maryla MorrowPatel, Ankit MD   Encounter Date: 04/30/2017  PT End of Session - 04/30/17 1009    Visit Number  3    Number of Visits  9    Date for PT Re-Evaluation  06/25/17    Authorization Type  pt reports has HSA/high deductible plan;  no visit limits    PT Start Time  0927    PT Stop Time  1008    PT Time Calculation (min)  41 min    Activity Tolerance  Patient tolerated treatment well    Behavior During Therapy  [facility] for tasks assessed/performed       Past Medical History:  Diagnosis Date  . Anxiety   . CVA (cerebral vascular accident) [facility](HCC)     Past Surgical History:  Procedure Laterality Date  . TEE WITHOUT CARDIOVERSION N/A 04/14/2017   Procedure: TRANSESOPHAGEAL ECHOCARDIOGRAM (TEE);  Surgeon: Dolores PattyBensimhon, Daniel R, MD;  [facility];  Service: Cardiovascular;  Laterality: N/A;    There were no vitals filed for this visit.  Subjective Assessment - 04/30/17 0930    Subjective  doing well; a little dizzy right now so balance is off.  headaches are improving    Patient Stated Goals  Want to be able to drive and not feel as dizzy; able exercise again (walking, running, elliptical, weights)                      [facility] Adult PT Treatment/Exercise - 04/30/17 1009      Exercises   Exercises  Knee/Hip      Knee/Hip Exercises: Aerobic   Elliptical  L2.0 x 6 min; 3 min forward/3 min backward          Balance Exercises - 04/30/17 0942      Balance Exercises: Standing   Standing Eyes Opened  Narrow base of support (BOS);Head turns;Foam/compliant surface    Standing Eyes Closed  Wide (BOA);Foam/compliant surface;Solid surface;30 secs    Rockerboard   Lateral;Head turns;Intermittent UE support with and without mirror for midline awareness    Gait with Head Turns  Forward;Retro;Foam/compliant surface;2 reps horizontal/vertical    Tandem Gait  Forward;Foam/compliant surface;4 reps increased difficulty    Other Standing Exercises  SLS to cones: single tap/double tap with supervision and occasional LOB and able to self correct; performed without looking at cones x 1 rep for proprioception        PT Education - 04/29/17 1113    Education provided  Yes    Education Details  reviewed pt's current vestibular exercises and replaced them for additional challenge; educated in head down 30 degrees with VOR and compensatory saccades; see additional HEP    Person(s) Educated  Patient    Methods  Explanation;Demonstration;Verbal cues;Handout    Comprehension  Verbalized understanding;Returned demonstration;Verbal cues required          PT Long Term Goals - 04/26/17 0949      PT LONG TERM GOAL #1   Title  Patient will be independent with HEP and able to verbalize plans for return to community-based exercise program. (Target all LTGs 05/25/17)    Time  4    Period  Weeks    Status  New    Target Date  05/25/17      PT LONG TERM GOAL #2   Title  Patient will demonstrate lesser fall risk with score of >22 on FGA.    Baseline  04/25/17   21/30    Time  4    Period  Weeks    Status  New      PT LONG TERM GOAL #3   Title  Patient will ambulate independently >1000 ft over level, unlevel outdoor surfaces with <6 inch drift off straight path.     Time  4    Period  Weeks    Status  New      PT LONG TERM GOAL #4   Title  Patient will score >=52 on Berg to indicate lesser fall risk.    Baseline  48 on d/c from CIR    Time  4    Period  Weeks    Status  New            Plan - 04/30/17 1010    Clinical Impression Statement  Pt tolerated session well today reporting decreased double vision.  No changes to HEP as only provided yesterday.   Progressing well with PT.  Continues to have significant difficulty with tandem and narrow BOS activities.    PT Treatment/Interventions  ADLs/Self Care Home Management;Gait training;Neuromuscular re-education;Balance training;Therapeutic exercise;Therapeutic activities;Functional mobility training;Stair training;Patient/family education;Vestibular;Visual/perceptual remediation/compensation    PT Next Visit Plan  work on balance exercises, especially on compliant surface; narrow BOS and proprioceptive exercises    Consulted and Agree with Plan of Care  Patient       Patient will benefit from skilled therapeutic intervention in order to improve the following deficits and impairments:  Abnormal gait, Decreased balance, Decreased mobility, Decreased coordination, Dizziness, Impaired sensation, Impaired vision/preception, Impaired UE functional use  Visit Diagnosis: Dizziness and giddiness  Other abnormalities of gait and mobility  Other symptoms and signs involving the nervous system  Other lack of coordination     Problem List Patient Active Problem List   Diagnosis Date Noted  . AKI (acute kidney injury) (HCC)   . Blood glucose elevated   . Generalized anxiety disorder   . Transaminitis   . CVA (cerebral vascular accident) (HCC) 04/17/2017  . 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 Altered sensation due to recent stroke   . Anxiety state   . Attention deficit disorder   . Hypokalemia   . Stroke, Wallenberg's syndrome   . Stroke (HCC) 04/11/2017  . Hyperlipidemia 04/11/2017  . 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 Anxiety       Clarita Crane, PT, DPT 04/30/17 10:12 AM    [facility] [address] [place], [place], [address] Phone: [phone]   Fax:  [phone]  Name: Jake Moore MRN: [id number] Date of Birth: 19-Sep-1975

## 2017-05-02 ENCOUNTER — Encounter: Payer: Self-pay | Admitting: Physical Medicine & Rehabilitation

## 2017-05-02 ENCOUNTER — Encounter
Payer: BLUE CROSS/BLUE SHIELD | Attending: Physical Medicine & Rehabilitation | Admitting: Physical Medicine & Rehabilitation

## 2017-05-02 VITALS — BP 119/79 | HR 85

## 2017-05-02 DIAGNOSIS — Q211 Atrial septal defect: Secondary | ICD-10-CM | POA: Insufficient documentation

## 2017-05-02 DIAGNOSIS — F419 Anxiety disorder, unspecified: Secondary | ICD-10-CM | POA: Insufficient documentation

## 2017-05-02 DIAGNOSIS — Z87891 Personal history of nicotine dependence: Secondary | ICD-10-CM | POA: Diagnosis not present

## 2017-05-02 DIAGNOSIS — G463 Brain stem stroke syndrome: Secondary | ICD-10-CM | POA: Diagnosis not present

## 2017-05-02 DIAGNOSIS — F411 Generalized anxiety disorder: Secondary | ICD-10-CM | POA: Diagnosis not present

## 2017-05-02 DIAGNOSIS — R269 Unspecified abnormalities of gait and mobility: Secondary | ICD-10-CM | POA: Diagnosis not present

## 2017-05-02 DIAGNOSIS — M791 Myalgia, unspecified site: Secondary | ICD-10-CM | POA: Insufficient documentation

## 2017-05-02 DIAGNOSIS — E785 Hyperlipidemia, unspecified: Secondary | ICD-10-CM | POA: Diagnosis not present

## 2017-05-02 DIAGNOSIS — R209 Unspecified disturbances of skin sensation: Secondary | ICD-10-CM | POA: Diagnosis not present

## 2017-05-02 DIAGNOSIS — I69398 Other sequelae of cerebral infarction: Secondary | ICD-10-CM

## 2017-05-02 DIAGNOSIS — Z9889 Other specified postprocedural states: Secondary | ICD-10-CM | POA: Diagnosis not present

## 2017-05-02 DIAGNOSIS — Z8249 Family history of ischemic heart disease and other diseases of the circulatory system: Secondary | ICD-10-CM | POA: Insufficient documentation

## 2017-05-02 DIAGNOSIS — R739 Hyperglycemia, unspecified: Secondary | ICD-10-CM | POA: Insufficient documentation

## 2017-05-02 DIAGNOSIS — R42 Dizziness and giddiness: Secondary | ICD-10-CM | POA: Diagnosis not present

## 2017-05-02 DIAGNOSIS — Z8673 Personal history of transient ischemic attack (TIA), and cerebral infarction without residual deficits: Secondary | ICD-10-CM | POA: Diagnosis not present

## 2017-05-02 DIAGNOSIS — R200 Anesthesia of skin: Secondary | ICD-10-CM | POA: Diagnosis not present

## 2017-05-02 NOTE — Progress Notes (Addendum)
Subjective:    Patient ID: Jake Moore, male    DOB: March 07, 1976, 42 y.o.   MRN: [id number]  HPI 42 y.o. 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   Admit date: 04/17/2017 Discharge date: 04/21/2017  At discharge, he was instructed to not drive, which he is not doing.  Pt was working in [employer], spending most of the time in his office. He went to [facility] Heme[facility]Onc and is undergoing workup. He was referred to Neurology at [facility].  He is following up with Neurology. He saw his PCP. Denies falls.   DME: Did not require. Mobility: No assistive device Therapies: 2/week Outpt  Pain Inventory Average Pain 5 Pain Right Now 5 My pain is dull and stabbing  In the last 24 hours, has pain interfered with the following? General activity 4 Relation with others 2 Enjoyment of life 2 What TIME of day is your pain at its worst? morning and evening Sleep (in general) Fair  Pain is worse with: inactivity and standing Pain improves with: therapy/exercise Relief from Meds: 5  Mobility walk without assistance ability to climb steps?  yes do you drive?  no  Function employed # of hrs/week . I need assistance with the following:  bathing and shopping  Neuro/Psych numbness tingling trouble walking dizziness  Prior Studies Any changes since last visit?  no  Physicians involved in your care Any changes since last visit?  no   Family History  Problem Relation Age of Onset  . Other Sister        hypercoagulable state with SCAD  . CAD Maternal Grandfather    Social History   Socioeconomic History  . Marital status: Single    Spouse name: None  . Number of children: None  . Years of education: None  . Highest education level: None  Social Needs  . Financial resource strain: None  . Food insecurity - worry: None  . Food insecurity - inability: None  .  Transportation needs - medical: None  . Transportation needs - non-medical: None  Occupational History  . None  Tobacco Use  . Smoking status: Former Smoker    Years: 20.00    Types: Cigarettes    Last attempt to quit: 01/15/2016    Years since quitting: 1.2  . Smokeless tobacco: Former Neurosurgeon    Types: Chew  Substance and Sexual Activity  . Alcohol use: Yes    Alcohol/week: 6.0 oz    Types: 10 Cans of beer per week    Comment: social  . Drug use: No  . Sexual activity: Yes  Other Topics Concern  . None  Social History Narrative  . None   Past Surgical History:  Procedure Laterality Date  . TEE WITHOUT CARDIOVERSION N/A 04/14/2017   Procedure: TRANSESOPHAGEAL ECHOCARDIOGRAM (TEE);  Surgeon: Dolores Patty, MD;  Location: [facility] ENDOSCOPY;  Service: Cardiovascular;  Laterality: N/A;   Past Medical History:  Diagnosis Date  . Anxiety   . CVA (cerebral vascular accident) (HCC)    BP 119/79   Pulse 85   SpO2 97%   Opioid Risk Score:   Fall Risk Score:  `1  Depression screen PHQ 2/9  Depression screen PHQ 2/9 05/02/2017  Decreased Interest 0  Down, Depressed, Hopeless 0  PHQ - 2 Score 0     Review of Systems  Constitutional: Positive for fatigue.  HENT: Negative.   Eyes: Negative.  Respiratory: Negative.   Cardiovascular: Negative.   Gastrointestinal: Negative.   Endocrine: Negative.   Genitourinary: Negative.   Musculoskeletal: Positive for gait problem.  Skin: Negative.   Allergic/Immunologic: Negative.   Neurological: Positive for dizziness.  Hematological: Negative.   Psychiatric/Behavioral: Negative.   All other systems reviewed and are negative.      Objective:   Physical Exam Constitutional: He appears well-developed and well-nourished. No distress.  HENT: Normocephalic and atraumatic.  Eyes: EOM are normal. No discharge. Cardiovascular: RRR. No JVD. Respiratory: Effort normal and breath sounds normal.  GI: Bowel sounds are normal. He  exhibits no distension.  Musculoskeletal: He exhibits no edema or tenderness.  Neurological: He is alert and oriented.  Speech clear.  Able to follow commands without difficulty.  Motor: 5/5 throughout No ataxia B/l UE Reduced right facial sensation to touch. Sensation diminished to light touch right face and LUE/LLE Skin: Skin is warm and dry. He is not diaphoretic.  Psychiatric: He has a normal mood and affect. His behavior is normal. Judgment and thought content normal.     Assessment & Plan:  42 y.o. 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   1. 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 Small PFO:    Needs follow up with Cards  To follow with Dr. Excell Seltzerooper on outpatient basis, needs appointment, may need closure if source crytogenic.    3. Anxiety disorder  Cont CBT  Consider meds if necessary  4. Elevated blood glucose   Encouraged follow up with PCP  5. Muscle pain  Pt to call back for Robaxin 750 BID if needed   Educated on avoiding NSAIDs  6. Neurologic gait disorder  Cont therapies  Handicap placard form filled out  Meds reviewed Referrals reviewed All questions answered

## 2017-05-06 ENCOUNTER — Telehealth: Payer: Self-pay | Admitting: Neurology

## 2017-05-06 NOTE — Telephone Encounter (Signed)
Patient is returning a call regarding lab work that was done in the hospital. The message said not enough blood. He can be reached at [phone].

## 2017-05-07 ENCOUNTER — Encounter: Payer: Self-pay | Admitting: Physical Therapy

## 2017-05-07 ENCOUNTER — Telehealth: Payer: Self-pay | Admitting: Neurology

## 2017-05-07 ENCOUNTER — Ambulatory Visit: Payer: BLUE CROSS/BLUE SHIELD | Admitting: Physical Therapy

## 2017-05-07 DIAGNOSIS — R29818 Other symptoms and signs involving the nervous system: Secondary | ICD-10-CM

## 2017-05-07 NOTE — Telephone Encounter (Signed)
  Jake Moore, Pramod S, MD  You 3 minutes ago (10:23 AM)      I returned the patient's call. He was in physical therapy and complained of some mild left neck pain and right hand numbness which had had from the morning. His therapy was canceled for the day. Patient states he is feeling better now and numbness has improved and so as neck pain. I reassured him that this would unlikely represent a new stroke. The patient did feel slightly under the weather yesterday and his children were recovering from flu like illness. I advised him to drink plenty of fluids and increase activity slowly. I also reviewed in care everywhere his recent hematology [facility]. Patient had hypercoagulable lab work was all negative including factor VIII levels. He was advised to stay on aspirin. He has been [facility] for his significantly elevated lipoprotein a. I advised him to stay on aspirin and Plavix for 3 months followed by aspirin alone. Keep scheduled appointment for follow-up with me. He voiced understanding

## 2017-05-07 NOTE — Therapy (Signed)
[facility] [address] [place], [place], [address] Phone: [phone]   Fax:  [phone]   Patient arrived stating he had a rough day yesterday. Nauseous but never vomited or diarrhea. Slept most of the day. Noticed RIGHT arm tingling and left sided neck pain. (Has been LEFT arm sensory changes with right neck pain). Wife encouraged him to go to ED yesterday and called his PCP this morning. They recommended go to ED, but he decided to come to therapy instead.   Sensory changes of right arm persisting this morning forearm and hand primarily. Not as numb, tingling as left side. Right coordination finger-nose-finger intact. BP 118/82. Denies headache.   Advised patient to follow-up with either neurologist or go to the ED and PT session cancelled for today. Advised patient to stop doing his VOR x1 exercises (head turns horizontal and vertical) until he returns to clinic.   Patient decided to walk next door to neurologist and see if he can be worked in.   Patient Details  Name: Jake Moore MRN: [id number] Date of Birth: 06/04/1975 Referring Provider:  Martha ClanShaw, William, MD  Encounter Date: 05/07/2017   Scherrie NovemberLynn P Lennyn Gange 05/07/2017, 9:49 AM  [facility] [address] [place], [place], [address] Phone: [phone]   Fax:  [phone]

## 2017-05-07 NOTE — Telephone Encounter (Signed)
I returned the patient's call. He was in physical therapy and complained of some mild left neck pain and right hand numbness which had had from the morning. His therapy was canceled for the day. Patient states he is feeling better now and numbness has improved and so as neck pain. I reassured him that this would unlikely represent a new stroke. The patient did feel slightly under the weather yesterday and his children were recovering from flu like illness. I advised him to drink plenty of fluids and increase activity slowly. I also reviewed in care everywhere his recent [facility]ology [facility]. Patient had hypercoagulable lab work was all negative including factor VIII levels. He was advised to stay on aspirin. He has been referred to lipid [facility] at [facility] for his significantly elevated lipoprotein a. I advised him to stay on aspirin and Plavix for 3 months followed by aspirin alone. Keep scheduled appointment for follow-up with me. He voiced understanding.

## 2017-05-07 NOTE — Telephone Encounter (Signed)
Pt at Rehab this morning. They sent him over as he is having new symptoms tingling in right arm and yesterday he was very nauseated, left-sided neck pain. Hoping to get worked in. Best call back. [phone]

## 2017-05-09 ENCOUNTER — Ambulatory Visit: Payer: BLUE CROSS/BLUE SHIELD | Admitting: Physical Therapy

## 2017-05-09 ENCOUNTER — Encounter: Payer: Self-pay | Admitting: Physical Therapy

## 2017-05-09 VITALS — BP 133/92 | HR 103

## 2017-05-09 DIAGNOSIS — R29818 Other symptoms and signs involving the nervous system: Secondary | ICD-10-CM

## 2017-05-09 DIAGNOSIS — R2689 Other abnormalities of gait and mobility: Secondary | ICD-10-CM | POA: Diagnosis not present

## 2017-05-09 DIAGNOSIS — R42 Dizziness and giddiness: Secondary | ICD-10-CM | POA: Diagnosis not present

## 2017-05-09 DIAGNOSIS — R278 Other lack of coordination: Secondary | ICD-10-CM | POA: Diagnosis not present

## 2017-05-10 NOTE — Therapy (Signed)
[facility] [address] [place], [place], [address] Phone: [phone]   Fax:  [phone]  Physical Therapy Treatment  Patient Details  Name: Jake BameRobert F Lookingbill MRN: [id number] Date of Birth: 01/23/1976 Referring Provider: Delle ReiningLove, Pamela PA; Maryla MorrowPatel, Ankit MD   Encounter Date: 05/09/2017  PT End of Session - 05/10/17 0825    Visit Number  4    Number of Visits  9    Date for PT Re-Evaluation  06/25/17    Authorization Type  pt reports has HSA/high deductible plan;  no visit limits (per pt); later found to have 30 visit limit)    Authorization - Visit Number  4    Authorization - Number of Visits  30    PT Start Time  1106    PT Stop Time  1147    PT Time Calculation (min)  41 min    Activity Tolerance  Patient tolerated treatment well    Behavior During Therapy  [facility] for tasks assessed/performed       Past Medical History:  Diagnosis Date  . Anxiety   . CVA (cerebral vascular accident) [facility](HCC)     Past Surgical History:  Procedure Laterality Date  . TEE WITHOUT CARDIOVERSION N/A 04/14/2017   Procedure: TRANSESOPHAGEAL ECHOCARDIOGRAM (TEE);  Surgeon: Dolores PattyBensimhon, Daniel R, MD;  [facility];  Service: Cardiovascular;  Laterality: N/A;    Vitals:   05/09/17 1113 05/09/17 1114 05/09/17 1115 05/09/17 1116  BP: 125/78 114/87 96/75 (!) 133/92  Pulse: 84 87 (!) 101 (!) 103   Asymptomatic with orthostasis testing (SBP dropped 31 mmHg)  Subjective Assessment - 05/09/17 1108    Subjective  Spoke with Dr. Pearlean BrownieSethi and was told not to go to ED (see his note). Spoke with friend who is a Physiological scientistvascular surgeon and felt pt is dehydrated. Reports rt arm tingling is gone. Feels generally weak    Pertinent History  anxiety/ADD, hyperlipidemia     How long can you stand comfortably?  15 minutes    How long can you walk comfortably?  less than a block    Patient Stated Goals  Want to be able to drive and not feel as dizzy; able  exercise again (walking, running, elliptical, weights)    Currently in Pain?  No/denies                       Vestibular Treatment/Exercise - 05/10/17 0001      Vestibular Treatment/Exercise   Vestibular Treatment Provided  Gaze    Gaze Exercises  X1 Viewing Horizontal;Eye/Head Exercise Horizontal;Eye/Head Exercise Vertical      X1 Viewing Horizontal   Foot Position  stand    Time  -- 30 sec became dysconjugate    Reps  2      Eye/Head Exercise Horizontal   Foot Position  stand, feet apart    Comments  tracking target with his eyes in right field of vision (focus to keep/get target in single-vision      Eye/Head Exercise Vertical   Foot Position  stand, feet apart    Comments  tracking target with his eyes in right field of vision (focus to keep/get target in single-vision         Balance Exercises - 05/10/17 0818      Balance Exercises: Standing   Standing Eyes Opened  Narrow base of support (BOS);Head turns;Foam/compliant surface    Standing Eyes Closed  Wide (BOA);Narrow base of support (BOS);Foam/compliant surface  Rockerboard  Anterior/posterior;Lateral;Head turns;EO;EC;Intermittent UE support    Gait with Head Turns  Forward    Tandem Gait  Forward;Retro;Intermittent upper extremity support;Foam/compliant surface foam vs no; difficulty placing feet going backwards             PT Long Term Goals - 04/26/17 0949      PT LONG TERM GOAL #1   Title  Patient will be independent with HEP and able to verbalize plans for return to community-based exercise program. (Target all LTGs 05/25/17)    Time  4    Period  Weeks    Status  New    Target Date  05/25/17      PT LONG TERM GOAL #2   Title  Patient will demonstrate lesser fall risk with score of >22 on FGA.    Baseline  04/25/17   21/30    Time  4    Period  Weeks    Status  New      PT LONG TERM GOAL #3   Title  Patient will ambulate independently >1000 ft over level, unlevel outdoor surfaces  with <6 inch drift off straight path.     Time  4    Period  Weeks    Status  New      PT LONG TERM GOAL #4   Title  Patient will score >=52 on Berg to indicate lesser fall risk.    Baseline  48 on d/c from CIR    Time  4    Period  Weeks    Status  New            Plan - 05/10/17 1610    Clinical Impression Statement  Continues with impaired balance with head turns while walking, narrow base of support, and most especially with eyes closed. Patient is motiated to improve and instructed to continue to rest as his brain requires this for healing.     PT Treatment/Interventions  ADLs/Self Care Home Management;Gait training;Neuromuscular re-education;Balance training;Therapeutic exercise;Therapeutic activities;Functional mobility training;Stair training;Patient/family education;Vestibular;Visual/perceptual remediation/compensation    PT Next Visit Plan  work on balance exercises, especially on compliant surface; narrow BOS and proprioceptive exercises    Consulted and Agree with Plan of Care  Patient       Patient will benefit from skilled therapeutic intervention in order to improve the following deficits and impairments:  Abnormal gait, Decreased balance, Decreased mobility, Decreased coordination, Dizziness, Impaired sensation, Impaired vision/preception, Impaired UE functional use  Visit Diagnosis: Other symptoms and signs involving the nervous system  Dizziness and giddiness  Other abnormalities of gait and mobility     Problem List Patient Active Problem List   Diagnosis Date Noted  . AKI (acute kidney injury) (HCC)   . Blood glucose elevated   . Generalized anxiety disorder   . Transaminitis   . CVA (cerebral vascular accident) (HCC) 04/17/2017  . 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 Altered sensation due to recent stroke   . Anxiety state   . Attention deficit disorder   . Hypokalemia   . Stroke, Wallenberg's syndrome   . Stroke (HCC) 04/11/2017  . Hyperlipidemia  04/11/2017  . 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 Anxiety     Zena Amos, PT 05/10/2017, 8:34 AM  [facility] [address] [place], [place], [address] Phone: [phone]   Fax:  [phone]  Name: KAJUAN GUYTON MRN: [id number] Date of Birth: 1976-01-06

## 2017-05-13 ENCOUNTER — Ambulatory Visit: Payer: BLUE CROSS/BLUE SHIELD | Admitting: Physical Therapy

## 2017-05-13 ENCOUNTER — Encounter: Payer: Self-pay | Admitting: Physical Therapy

## 2017-05-13 DIAGNOSIS — R29818 Other symptoms and signs involving the nervous system: Secondary | ICD-10-CM

## 2017-05-13 DIAGNOSIS — R278 Other lack of coordination: Secondary | ICD-10-CM | POA: Diagnosis not present

## 2017-05-13 DIAGNOSIS — R2689 Other abnormalities of gait and mobility: Secondary | ICD-10-CM | POA: Diagnosis not present

## 2017-05-13 DIAGNOSIS — R42 Dizziness and giddiness: Secondary | ICD-10-CM

## 2017-05-13 NOTE — Therapy (Signed)
[facility] [address] [place], [place], [address] Phone: [phone]   Fax:  [phone]  Physical Therapy Treatment  Patient Details  Name: Jake Moore MRN: [id number] Date of Birth: 07/04/1975 Referring Provider: Delle ReiningLove, Pamela PA; Maryla MorrowPatel, Ankit MD   Encounter Date: 05/13/2017  PT End of Session - 05/13/17 1950    Visit Number  5    Number of Visits  9    Date for PT Re-Evaluation  06/25/17    Authorization Type  pt reports has HSA/high deductible plan;  no visit limits (per pt); later found to have 30 visit limit)    Authorization - Visit Number  5    Authorization - Number of Visits  30    PT Start Time  1210    PT Stop Time  1250    PT Time Calculation (min)  40 min    Activity Tolerance  Patient tolerated treatment well    Behavior During Therapy  [facility] for tasks assessed/performed       Past Medical History:  Diagnosis Date  . Anxiety   . CVA (cerebral vascular accident) [facility](HCC)     Past Surgical History:  Procedure Laterality Date  . TEE WITHOUT CARDIOVERSION N/A 04/14/2017   Procedure: TRANSESOPHAGEAL ECHOCARDIOGRAM (TEE);  Surgeon: Dolores PattyBensimhon, Daniel R, MD;  [facility];  Service: Cardiovascular;  Laterality: N/A;    There were no vitals filed for this visit.  Subjective Assessment - 05/13/17 1210    Subjective  Is doing well. Fatigue is less and has been going to work for several hours at a time. Double vision is gone even in furthest rt field of vision. does feel eye strain when trying to holding rt eye in abduction    Pertinent History  anxiety/ADD, hyperlipidemia     How long can you stand comfortably?  15 minutes    How long can you walk comfortably?  less than a block    Patient Stated Goals  Want to be able to drive and not feel as dizzy; able exercise again (walking, running, elliptical, weights)    Currently in Pain?  No/denies                      [facility]  PT Treatment/Exercise - 05/13/17 1931      Neuro Re-ed    Neuro Re-ed Details   Proprioception exercises for LLE including side stepping on toes with knees slightly flexed, braiding with increasing velocity; on red mat: slow backwards walking, walking figure 8's, push test posteriorly and to his rt using LLE to "catch" himself; in supine, moving LLE to touch targets along his RLE, and with eyes closed place RLE in various positions and then trying to imitate with LLE. Patient with difficulty with walking figure 8's (ankle instability) and supine placing exercises      Exercises   Exercises  Knee/Hip;Other Exercises    Other Exercises   Discussed patient used a trainer prior to CVA. Discussed his interest in returning to work with the trainer upon completing PT. Discussed benefits to using trainer when intially returning to the gym as an added level of security as he continues to increase the difficulty of tasks. Patient mentioned an interest in trying yoga even before his CVA but doesn't feel comfortable attending class "with a lot of people" and not knowing how he will do. Educated in using [employer] to try yoga at home (recommended Yoga with Kieth Brightlydrien). Discussed  plan for continuing PT beyond last appointments scheduled for next week in case additional time is needed (without having a gap in appointments due to full schedule.                 Knee/Hip Exercises: Plyometrics   Bilateral Jumping  1 set; 5 reps    Bilateral Jumping Limitations  as pt leaves the floor, LLE adducts and pt's upper body leans to the right    Other Plyometric Exercises  gentle coordinated bouncing on trampoline building up to jumping with feet leaving the surface; pt with good control (ankles & knees) until feet actually left the surface and then bounced away from center of trampoline with overshooting and undershooting as he tried to jump back to center             PT Education - 05/13/17 1949    Education  provided  Yes    Education Details  Educated pt in use of supine proprioception exercises to add to his HEP.     Person(s) Educated  Patient    Methods  Explanation;Demonstration    Comprehension  Verbalized understanding;Need further instruction          PT Long Term Goals - 04/26/17 0949      PT LONG TERM GOAL #1   Title  Patient will be independent with HEP and able to verbalize plans for return to community-based exercise program. (Target all LTGs 05/25/17)    Time  4    Period  Weeks    Status  New    Target Date  05/25/17      PT LONG TERM GOAL #2   Title  Patient will demonstrate lesser fall risk with score of >22 on FGA.    Baseline  04/25/17   21/30    Time  4    Period  Weeks    Status  New      PT LONG TERM GOAL #3   Title  Patient will ambulate independently >1000 ft over level, unlevel outdoor surfaces with <6 inch drift off straight path.     Time  4    Period  Weeks    Status  New      PT LONG TERM GOAL #4   Title  Patient will score >=52 on Berg to indicate lesser fall risk.    Baseline  48 on d/c from CIR    Time  4    Period  Weeks    Status  New            Plan - 05/13/17 1952    Clinical Impression Statement  Continued imbalance when on compliant surfaces and decreased proprioception increases difficulty of tasks. Patient very offbalance with attempts at jumping off 2 feet./landing on 2 feet. Pateint can continue to benefit from PT to improve safety with mobility (prevent falls).     PT Treatment/Interventions  ADLs/Self Care Home Management;Gait training;Neuromuscular re-education;Balance training;Therapeutic exercise;Therapeutic activities;Functional mobility training;Stair training;Patient/family education;Vestibular;Visual/perceptual remediation/compensation    PT Next Visit Plan  balance activities (especially on compliant surface, narrow BOS, and EC); jumping  on ground vs on mini-trampoline (?use 2# weight on LLE for increased feedback for  proprioception vs wrap leg with ace wrap;   Consulted and Agree with Plan of Care  Patient       Patient will benefit from skilled therapeutic intervention in order to improve the following deficits and impairments:  Abnormal gait, Decreased balance, Decreased mobility, Decreased coordination, Dizziness, Impaired  sensation, Impaired vision/preception, Impaired UE functional use  Visit Diagnosis: Other symptoms and signs involving the nervous system  Dizziness and giddiness  Other abnormalities of gait and mobility  Other lack of coordination     Problem List Patient Active Problem List   Diagnosis Date Noted  . AKI (acute kidney injury) (HCC)   . Blood glucose elevated   . Generalized anxiety disorder   . Transaminitis   . CVA (cerebral vascular accident) (HCC) 04/17/2017  . 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 Altered sensation due to recent stroke   . Anxiety state   . Attention deficit disorder   . Hypokalemia   . Stroke, Wallenberg's syndrome   . Stroke (HCC) 04/11/2017  . Hyperlipidemia 04/11/2017  . 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 Anxiety     Jake Moore , PT 05/13/2017, 7:58 PM  [facility] [address] [place], [place], [address] Phone: [phone]   Fax:  [phone]  Name: Jake Moore MRN: [id number] Date of Birth: Apr 11, 1976

## 2017-05-14 ENCOUNTER — Encounter
Admit: 2017-05-14 | Discharge: 2017-05-15 | Payer: PRIVATE HEALTH INSURANCE | Attending: Cardiovascular Disease | Primary: Cardiovascular Disease

## 2017-05-14 DIAGNOSIS — I639 Cerebral infarction, unspecified: Secondary | ICD-10-CM

## 2017-05-14 DIAGNOSIS — E7849 Other hyperlipidemia: Principal | ICD-10-CM

## 2017-05-14 DIAGNOSIS — Z7982 Long term (current) use of aspirin: Secondary | ICD-10-CM | POA: Diagnosis not present

## 2017-05-14 DIAGNOSIS — I63211 Cerebral infarction due to unspecified occlusion or stenosis of right vertebral arteries: Secondary | ICD-10-CM | POA: Diagnosis not present

## 2017-05-14 DIAGNOSIS — Z8673 Personal history of transient ischemic attack (TIA), and cerebral infarction without residual deficits: Secondary | ICD-10-CM | POA: Diagnosis not present

## 2017-05-14 DIAGNOSIS — Z87891 Personal history of nicotine dependence: Secondary | ICD-10-CM | POA: Diagnosis not present

## 2017-05-14 DIAGNOSIS — Z880 Allergy status to penicillin: Secondary | ICD-10-CM | POA: Diagnosis not present

## 2017-05-14 DIAGNOSIS — F419 Anxiety disorder, unspecified: Secondary | ICD-10-CM | POA: Diagnosis not present

## 2017-05-15 MED ORDER — EVOLOCUMAB 140 MG/ML SUBCUTANEOUS PEN INJECTOR
SUBCUTANEOUS | 11 refills | 0.00000 days | Status: CP
Start: 2017-05-15 — End: 2018-01-07

## 2017-05-15 MED ORDER — EVOLOCUMAB 140 MG/ML SUBCUTANEOUS PEN INJECTOR: 140 mg | Syringe | 11 refills | 0 days | Status: AC

## 2017-05-16 ENCOUNTER — Encounter: Payer: Self-pay | Admitting: Physical Therapy

## 2017-05-16 ENCOUNTER — Ambulatory Visit: Payer: BLUE CROSS/BLUE SHIELD | Admitting: Physical Therapy

## 2017-05-16 DIAGNOSIS — R2689 Other abnormalities of gait and mobility: Secondary | ICD-10-CM | POA: Diagnosis not present

## 2017-05-16 DIAGNOSIS — R29818 Other symptoms and signs involving the nervous system: Secondary | ICD-10-CM

## 2017-05-16 DIAGNOSIS — R42 Dizziness and giddiness: Secondary | ICD-10-CM | POA: Diagnosis not present

## 2017-05-16 DIAGNOSIS — R278 Other lack of coordination: Secondary | ICD-10-CM

## 2017-05-16 LAB — APOLIPOPROTEIN B: Apolipoprotein B:MCnc:Pt:Ser/Plas:Qn:: 83

## 2017-05-16 NOTE — Therapy (Signed)
[facility] [address] [place], [place], [address] Phone: [phone]   Fax:  [phone]  Physical Therapy Treatment  Patient Details  Name: Jake Moore MRN: [id number] Date of Birth: Jul 24, 1975 Referring Provider: Delle Reining PA; Maryla Morrow MD   Encounter Date: 05/16/2017  PT End of Session - 05/16/17 1753    Visit Number  6    Number of Visits  9    Date for PT Re-Evaluation  06/25/17    Authorization Type  pt reports has HSA/high deductible plan;  no visit limits (per pt); later found to have 30 visit limit)    Authorization - Visit Number  6    Authorization - Number of Visits  30    PT Start Time  1153    PT Stop Time  1235    PT Time Calculation (min)  42 min    Activity Tolerance  Patient tolerated treatment well    Behavior During Therapy  [facility] for tasks assessed/performed       Past Medical History:  Diagnosis Date  . Anxiety   . CVA (cerebral vascular accident) [facility])     Past Surgical History:  Procedure Laterality Date  . TEE WITHOUT CARDIOVERSION N/A 04/14/2017   Procedure: TRANSESOPHAGEAL ECHOCARDIOGRAM (TEE);  Surgeon: Dolores Patty, MD;  Location: [facility] ENDOSCOPY;  Service: Cardiovascular;  Laterality: N/A;    There were no vitals filed for this visit.  Subjective Assessment - 05/16/17 1155    Subjective  Not experiencing headaches. Continues to have neck pain rt>lt. Rode 1 hr to [facility], saw cardiologist at [facility], and rode 1 hour home experiencing "wooziness." First time he has felt this way in many days. Will see [facility]  neurologist on Monday. Will have stress test next week.     Pertinent History  anxiety/ADD, hyperlipidemia     How long can you stand comfortably?  15 minutes    How long can you walk comfortably?  less than a block    Patient Stated Goals  Want to be able to drive and not feel as dizzy; able exercise again (walking, running, elliptical, weights)    Currently in Pain?  No/denies                      [facility] Adult PT Treatment/Exercise - 05/16/17 1310      Ambulation/Gait   Ambulation/Gait Assistance  7: Independent    Ambulation Distance (Feet)  600 Feet    Assistive device  None    Gait Pattern  Within Functional Limits    Ambulation Surface  Level;Unlevel;Indoor;Outdoor;Paved;Gravel;Grass straw, mulch    Gait Comments  stepping over parking breaks, up/down curbs; no issues      Knee/Hip Exercises: Aerobic   Elliptical  L2.2, 5 min total alternating forward and backward; Max HR 140... attempted to get pt to slow down, however           Balance Exercises - 05/16/17 1748      Balance Exercises: Standing   Rockerboard  Anterior/posterior;Head turns;EO;EC;Lateral blue airex foam on rockerboard with BUEs using zoomball    Other Standing Exercises  jumping two feet to two feet; single foot to two feet; skipping; coordination ladder on the floor (jumping feet in/out, sideways braiding, etc              PT Long Term Goals - 04/26/17 0949      PT LONG TERM GOAL #1   Title  Patient will be independent with HEP and able to verbalize plans for return to community-based exercise program. (Target all LTGs 05/25/17)    Time  4    Period  Weeks    Status  New    Target Date  05/25/17      PT LONG TERM GOAL #2   Title  Patient will demonstrate lesser fall risk with score of >22 on FGA.    Baseline  04/25/17   21/30    Time  4    Period  Weeks    Status  New      PT LONG TERM GOAL #3   Title  Patient will ambulate independently >1000 ft over level, unlevel outdoor surfaces with <6 inch drift off straight path.     Time  4    Period  Weeks    Status  New      PT LONG TERM GOAL #4   Title  Patient will score >=52 on Berg to indicate lesser fall risk.    Baseline  48 on d/c from CIR    Time  4    Period  Weeks    Status  New            Plan - 05/16/17 1754    Clinical Impression Statement  Session  focused on coordination and balance during a variety of activities. Balance continues to improve, however does have imblance wit higher level activities. With 5 minutes on elliptical, pt's HR up to 140 bpm and discussed need to better monitor the intensity of his exercise to keep HR<120 until he has his cardiac stress test. Patient progressing well and can continue to benefit from PT.     PT Treatment/Interventions  ADLs/Self Care Home Management;Gait training;Neuromuscular re-education;Balance training;Therapeutic exercise;Therapeutic activities;Functional mobility training;Stair training;Patient/family education;Vestibular;Visual/perceptual remediation/compensation    PT Next Visit Plan  balance activities (especially on compliant surface, narrow BOS, and EC); jumping (?use 2# weight on LLE for increased feedback for proprioception vs wrap leg with ace wrap; on ground vs on mini-trampoline    Consulted and Agree with Plan of Care  Patient       Patient will benefit from skilled therapeutic intervention in order to improve the following deficits and impairments:  Abnormal gait, Decreased balance, Decreased mobility, Decreased coordination, Dizziness, Impaired sensation, Impaired vision/preception, Impaired UE functional use  Visit Diagnosis: Other symptoms and signs involving the nervous system  Other abnormalities of gait and mobility  Other lack of coordination     Problem List Patient Active Problem List   Diagnosis Date Noted  . AKI (acute kidney injury) (HCC)   . Blood glucose elevated   . Generalized anxiety disorder   . Transaminitis   . CVA (cerebral vascular accident) (HCC) 04/17/2017  . 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 Altered sensation due to recent stroke   . Anxiety state   . Attention deficit disorder   . Hypokalemia   . Stroke, Wallenberg's syndrome   . Stroke (HCC) 04/11/2017  . Hyperlipidemia 04/11/2017  . 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  Anxiety     Zena AmosLynn P Seville Brick, PT 05/16/2017, 5:59 PM  [facility] [address] [place], [place], [address] Phone: [phone]   Fax:  [phone]  Name: Jake Moore MRN: [id number] Date of Birth: 04/08/1976

## 2017-05-16 NOTE — Unmapped (Signed)
Per test claim for Repatha at the [facility], patient needs Medication Assistance Program for Prior Authorization.

## 2017-05-19 ENCOUNTER — Other Ambulatory Visit: Payer: Self-pay | Admitting: Cardiology

## 2017-05-19 ENCOUNTER — Encounter: Admit: 2017-05-19 | Discharge: 2017-05-19 | Payer: PRIVATE HEALTH INSURANCE

## 2017-05-19 ENCOUNTER — Encounter: Admit: 2017-05-19 | Discharge: 2017-05-19 | Payer: PRIVATE HEALTH INSURANCE | Attending: Neurology | Primary: Neurology

## 2017-05-19 DIAGNOSIS — I951 Orthostatic hypotension: Secondary | ICD-10-CM

## 2017-05-19 DIAGNOSIS — I63531 Cerebral infarction due to unspecified occlusion or stenosis of right posterior cerebral artery: Secondary | ICD-10-CM

## 2017-05-19 DIAGNOSIS — I639 Cerebral infarction, unspecified: Principal | ICD-10-CM

## 2017-05-19 DIAGNOSIS — I6509 Occlusion and stenosis of unspecified vertebral artery: Principal | ICD-10-CM

## 2017-05-19 DIAGNOSIS — Z6825 Body mass index (BMI) 25.0-25.9, adult: Secondary | ICD-10-CM | POA: Diagnosis not present

## 2017-05-19 NOTE — Unmapped (Signed)
Applied Zio to patient for 14 days. Patient understood instructions

## 2017-05-19 NOTE — Unmapped (Addendum)
-   Continue to take your Crestor as directed.  - recommend switching to an uncoated baby aspirin.   - Continue with PT.  - Continue with heart monitoring.   - It sounds like you might have orthostasis. Remember to stay hydrated, and take your time standing up.   - Follow up with your PCP (Primary care provider), as scheduled.  - Return to [facility] for follow-up as needed.    Remember, if you experience the sudden or new onset of the following symptoms, you should call 911 and present to the nearest emergency department immediately.    - Weakness or numbness of the arm, hand, and/or leg  - Drooping of the face on one side, or facial asymmetry  - Slurred speech or word-finding difficulty  - Visual disturbances, such as double vision   - Suddenly and persistently poor balance, coordination, or vertigo  - Very severe headache (the worst headache of your life)

## 2017-05-19 NOTE — Unmapped (Signed)
[facility] Neurology Stroke Clinic Note - NEW Encounter    FINL [address]  [facility] NEUROLOGY CLINIC [place] [address] [facility] [place]  [address]  [place] [address]  [phone]    Date: May 19, 2017  Patient Name: Dakota Galvan  MRN: [id number]  PCP: Donnie Mesa  Referring Provider: Cherie Dark, MD    Assessment:        Dakota Galvan is a 42 y.o. male with a past medical history as noted below who presents for evaluation of an acute ischemic stroke. His neurological examination demonstrates mild focal sensory deficits. He reports today symptoms consistent with orthostasis, and we discussed conservative treatment modalities. I agree with continued aspirin monotherapy, given the available data on the patient's risk factors. He is appropriately on a high potency statin. The contribution of his small PFO to his stroke risk is unclear, as it has yet to be shown that closing small PFOs reduces stroke risk in patients with few or no classic risk factors. He should continue with cardiac monitoring, though the duration of monitoring remains unclear. His blood pressure appears well controlled today.  Follow-up in Stroke Clinic as needed.       Plan:        1. Ischemic stroke.     - I completed the NIH stroke scale (NIHSS) in order to determine stroke severity and modified Rankin scale to determine global functional disability.  - I personally reviewed the patient's prior [facility] medical records, imaging, and lab work.  - I reviewed and discussed with the patient the diagnosis of stroke, relevant images and test results, possible prognosis and time course of recovery, risk factors for initial and further stroke, medications for secondary stroke prevention, and answered all questions.   - The patient was counseled that if he experiences symptoms of acute-onset arm or leg weakness/numbness, facial droop, slurred speech/word-finding difficulty, visual disturbances, loss of balance or coordination, or severe headache, he should call 911 and present to the nearest emergency department immediately.      The plan as communicated in the After Visit Summary (AVS) is a follows:  - Continue to take your Crestor as directed.  - recommend switching to an uncoated baby aspirin.   - Continue with PT.  - Continue with heart monitoring.   - It sounds like you might have orthostasis. Remember to stay hydrated, and take your time standing up.   - Follow up with your PCP (Primary care provider), as scheduled.  - Return to [facility] for follow-up as needed.         Subjective:        HPI:  Dakota Galvan is a 42 y.o. right handed male seen in consultation at the [facility] Neurology Outpatient Clinics at the request of Cherie Dark, MD for evaluation of posterior vascular stroke.  The past medical history is listed below.    Prior to the appointment, I personally reviewed old records from 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 Transesophageal echocardiography showed a small PFO. Risk stratification labs showed LDL cholesterol was 57, hemoglobin A1c was 5.2%. Protein C activity, Protein S activity, Homocysteine, Antithrombin III, anticardiolipin antibody, Beta  2 glycoprotein, Factor V Leiden, prothrombin gene mutation were unremarkable apart from one normal and one abnormal lups anticoagulant test, on two assays performed one day apart. Factor VIII level was normal on 04/20/2017, then elevated to 175 on 12/31, though the significance of this is felt to be low. He was discharged on dual antiplatelet therapy and on an increased dose of rosuvastatin. Later testing by [facility] Hematology showed negative JAK2 mutation testing, thrombin clotting time, PNH assay, fibrinogen testing. 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 It was recommended he continue with antiplatelet monotherapy.     Medical co-morbidities noted to be contributing to the patient's stroke risk include the following: family history of early cardiovascular disease    Interval history:   Obtained history from: patient, wife  Patient caretaker: n/a  Dakota Galvan presents to [facility] today, accompanied by his wife. During today's encounter, the the following history is added:     The patient reports work-related stress in the days leading up to his stroke presentation. He recalls waking up on the morning prior to admission with a headache and feeling off. He fell asleep that morning for a short nap and then leaped out of bed after being woken up by his phone. He was lightheaded on standing and noted rapidly progressively worsening lightheadedness and frank vertigo, and after a few minutes was unable to stand. He had severe emesis. He was discharged with a diagnosis of vertigo. That night his right face became numb. The following day he returned to the ED after continued worsening symptoms, including veering to the right while ambulating and a severe posterior headache.     The patient notes a personal history of elevated lipoprotein A, for which he was started on rosuvastatin.     He notes his sister had an MI age 18, with 90% occlusion of the relevant artery, without significant comorbidities. She has a history of a DVT and PEs as well, while pregnant. She has an elevated factor VIII. His maternal grandfather had a fatal MI age 92. His son has pyruvate kinase deficiency; he and his wife are carriers.    Since discharge, Dakota Galvan symptoms have improved. He is mildly unstable on first getting up in the morning, but this resolves by the afternoon. He continues to feel significant fatigue since his stroke. He is unable to heel-toe walk without falling to the right side. His right face and left arm and leg continue to be numb and he has mild, subjective dysarthria. He used to have double vision, but this has resolved. He continues with twice weekly PT. He endorses anxiety, but this is not worse than prior to his stroke (he had feared a major medical event, and feels some relief that a stroke has occurred.) He takes a coated aspirin 325 mg daily. He had stopped Prozac in favor of taking Plavix.     He notes that PT states his blood pressure falls to the 130s to the 90s-100s systolic on standing. He becomes lightheaded and dizzy, meaning woozy, which is not the same as the vertigo of his stroke. Dehydration worsens this.       Past Medical History:  Past Medical History:   Diagnosis Date   ??? Stroke (CMS-HCC)        Past Surgical History:  No past surgical history on file.    Social History:  Living situation: The patient lives with his family.  Tobacco use: He  reports that he quit smoking about  16 months ago. He has never used smokeless tobacco..   Alcohol use: He   reports that he drinks alcohol.  Drug use: He  reports that he does not use drugs.    Family History:  Family History   Problem Relation Age of Onset   ??? Heart attack Sister 51       Home Medications:  Current Outpatient Prescriptions   Medication Sig Dispense Refill   ??? aspirin 325 MG tablet Take 325 mg by mouth.     ??? clopidogrel (PLAVIX) 75 mg tablet Take 75 mg by mouth.     ??? rosuvastatin (CRESTOR) 20 MG tablet Take 20 mg by mouth.     ??? STRATTERA 10 mg capsule Take by mouth.  11   ??? evolocumab (REPATHA SURECLICK) 140 mg/mL PnIj Inject 140 mg under the skin every fourteen (14) days. (Patient not taking: Reported on 05/19/2017) 2 Syringe 11     No current facility-administered medications for this visit.        Allergies:  Allergies   Allergen Reactions   ??? Penicillins Hives     Has patient had a PCN reaction causing immediate rash, facial/tongue/throat swelling, SOB or lightheadedness with hypotension: YES  Has patient had a PCN reaction causing severe rash involving mucus membranes or skin necrosis: NO  Has patient had a PCN reaction that required hospitalizationNO  Has patient had a PCN reaction occurring within the last 10 years: NO  If all of the above answers are NO, then may proceed with Cephalosporin use.        Review of Systems:  12 review of systems is negative except for the symptoms mentioned in HPI.       Objective:      Vitals:  BP 124/81 (BP Site: L Arm, BP Position: Sitting, BP Cuff Size: Medium)  - Ht 175 cm (5' 8.9)  - Wt 79.1 kg (174 lb 6.4 oz)  - BMI 25.83 kg/m??     Physical Exam:  Constitutional: well appearing Caucasian man in NAD  Eyes: Anicteric sclerae  EENT: Moist mucous membranes, Mallampati IV  Respiratory: Lungs clear to auscultation bilaterally, no rales  Cardiovascular: Regular heart rate and rhythm, no murmurs/rubs/gallops. No carotid bruits  Gastrointestinal: Abdomen non-obese, non-distended  Psychiatric: Affect appears normal, see HPI for Mood  Musculoskeletal: No cyanosis, clubbing or edema in distal extremities. Gait: mildly wide-based, taking a mildly wider step with his right leg to stabilize his gait, particularly with turns. He veers to the right on one occasion. On firs standing, he was unsteady on his feet for a few seconds. Normal arm swing and stride length. He is unable to heel-toe walk without falling to both sides. Romberg's sign is not present; he sways, but does not fall or step to the side to catch himself. See Neurological section for evaluation of muscle tone and motor power    Neurological Examination:   MENTAL STATUS: Alert and oriented to person, place, and time. Attention and concentration within normal limits. Fund of knowledge within normal limits. Speech with no  dysarthria, able to name and repeat with no  difficulty. Recent and remote memory appear to be intact.  CRANIAL NERVES: Fundoscopic exam reveals distinct disk margins and no hemorrhages. Visual fields intact to finger count. PERRL. Extraocular movements are intact with no nystagmus. Facial sensation reduced to light touch and cold temperature in all three divisions of CN V on the right. Face symmetrical at rest. Activation of the lower face is symmetric.  Activation of the upper face is symmetric. Hearing grossly intact to normal speech and finger rub bilaterally. Spontaneous palate movement is mildly weak on the right. Full shoulder shrug bilaterally. Tongue protrudes midline with normal side-to-side tongue movements.  SENSORY: Reduced LT LUE, LLE. Reduced PP RUE, LLE. Reduced cold temperature LLE. Normal vibration throughout. Double simultaneous sensory stimulation shows no extinction.  MOTOR: Normal tone and bulk throughout. Pronator drift is absent. Motor power is MRC grading scale, out of 5:  Upper extremities (Right/Left): 5/5 deltoid, 5/5 tricep, 5/5 bicep, 5/5 interosseous, 5/5 hand grip.   Lower extremities (Right/Left): 5/5 iliopsoas, 5/5 knee extension, 5/5 flexion, 5/5 foot dorsiflexion, 5/5 plantar flexion.  REFLEXES: Reflexes symmetric and 2+ throughout.. Toes are down going bilaterally.   CEREBELLAR: Finger-to-nose intact bilat. Heel-to-shin intact bilat. Rapid alternating movements are normal bilaterally.       NIH Stroke Scale:  1a.  Level of Consciousness: 0 = Alert; keenly responsive  1b.  LOC questions: 0 = Answers both questions correctly  1c.  LOC commands: 0 = Performs both tasks correctly  2.    Best Gaze: 0 = Normal  3.    Visual: 0 = No visual loss  4.    Facial Palsy: 0 = Normal symmetric movement  5a.  Motor left arm: 0 = No drift, limb holds 90 (or 45) degrees for full 10 seconds  5b.  Motor right arm: 0 = No drift, limb holds 90 (or 45) degrees for full 10 seconds  6a.  Motor left leg: 0 = No drift, limb holds 90 (or 45) degrees for full 5 seconds  6b.  Motor right leg: 0 = No drift, limb holds 90 (or 45) degrees for full 5 seconds  7.    Limb Ataxia: 0 = Absent  8.    Sensory: 1 = Mild to moderate sensory loss; patient feels pinprick is less sharp or is dull on the affected side; there is a loss of superficial pain with pinprick but patient is aware of being touched  9.    Best language: 0 = No aphasia, normal  10.  Dysarthria: 0 = Normal  11.  Extinction and Inattention: 0 = No abnormality    Total NIHSS: 1    Modified Rankin Scale (MRS):  Total Score (0-6):  1 = No significant disability; symptoms present, but able to carry out all usual activities    Records Review:   I reviewed [place] Everywhere/outside provider records from 2018 to the present with pertinent contents summarized herein.     Labs:   I reviewed the following pertinent [place] laboratory values:  Lab Results   Component Value Date    WBC 4.2 (L) 10/05/2010    HGB 14.1 10/05/2010    HCT 41.0 10/05/2010    PLT 153 10/05/2010     No results found for: LDL  No results found for: A1C    I reviewed laboratory data from outside records and documented pertinent results, if available, above.     Studies:  I independently visualized the following imaging studies:  None    __________________________________________________     - Total time spent with the patient was 60 minutes, with greater than 50% of that time spent in face-to-face counseling and coordination of care of the above issues.      Blondell Reveal, MD  [facility] Department of Neurology  05/19/2017

## 2017-05-20 ENCOUNTER — Encounter: Payer: Self-pay | Admitting: Physical Therapy

## 2017-05-20 ENCOUNTER — Ambulatory Visit: Payer: BLUE CROSS/BLUE SHIELD | Admitting: Physical Therapy

## 2017-05-20 DIAGNOSIS — R29818 Other symptoms and signs involving the nervous system: Secondary | ICD-10-CM | POA: Diagnosis not present

## 2017-05-20 DIAGNOSIS — R42 Dizziness and giddiness: Secondary | ICD-10-CM | POA: Diagnosis not present

## 2017-05-20 DIAGNOSIS — R278 Other lack of coordination: Secondary | ICD-10-CM | POA: Diagnosis not present

## 2017-05-20 DIAGNOSIS — R2689 Other abnormalities of gait and mobility: Secondary | ICD-10-CM | POA: Diagnosis not present

## 2017-05-20 NOTE — Therapy (Signed)
[facility] Out[facility] Rehabilitation [facility] [address] [place], [place], [address] Phone: [phone]   Fax:  [phone]  Physical Therapy Treatment  Patient Details  Name: Jake Moore MRN: [id number] Date of Birth: Jul 07, 1975 Referring Provider: Delle Reining PA; Maryla Morrow MD   Encounter Date: 05/20/2017  PT End of Session - 05/20/17 1215    Visit Number  7    Number of Visits  9    Date for PT Re-Evaluation  06/25/17    Authorization Type  pt reports has HSA/high deductible plan;  no visit limits (per pt); later found to have 30 visit limit)    Authorization - Visit Number  7    Authorization - Number of Visits  30    PT Start Time  1104    PT Stop Time  1142    PT Time Calculation (min)  38 min    Activity Tolerance  Patient tolerated treatment well    Behavior During Therapy  [facility] for tasks assessed/performed       Past Medical History:  Diagnosis Date  . Anxiety   . CVA (cerebral vascular accident) [facility])     Past Surgical History:  Procedure Laterality Date  . TEE WITHOUT CARDIOVERSION N/A 04/14/2017   Procedure: TRANSESOPHAGEAL ECHOCARDIOGRAM (TEE);  Surgeon: Dolores Patty, MD;  Location: [facility] ENDOSCOPY;  Service: Cardiovascular;  Laterality: N/A;    There were no vitals filed for this visit.  Subjective Assessment - 05/20/17 1106    Subjective  Went to [facility]. Was cleared by to neurologist start driving. Was having a funny feeling when turning head to look left and right at intersections. Did not have a stress test done yesterday. Instead they put him on a monitor for 2 weeks. Currently not planning to have a stress test.     Pertinent History  anxiety/ADD, hyperlipidemia     How long can you stand comfortably?  15 minutes    How long can you walk comfortably?  less than a block    Patient Stated Goals  Want to be able to drive and not feel as dizzy; able exercise again (walking, running, elliptical,  weights)    Currently in Pain?  No/denies                      [facility] Adult PT Treatment/Exercise - 05/20/17 1157      Ambulation/Gait   Gait Comments  on sidewalk, jogging with pt initially drifting to his rt, then left (likely overcorrection) and then continues slight drift left and right (path ~ 18-24" wide); high skipping more symmetric      Knee/Hip Exercises: Plyometrics   Bilateral Jumping  2 sets;5 reps    Bilateral Jumping Limitations  vertical jump; pt with lean to left as he takes off and lands LLE slightly before RLE lands; attempted incr proprioception with 2# weight on RLE, 1# wt on LLE and then vice versa with no change in left lean    Other Plyometric Exercises  long jump much more symmetrical      Vestibular Treatment/Exercise - 05/20/17 0001      Vestibular Treatment/Exercise   Gaze Exercises  Eye/Head Exercise Horizontal      Eye/Head Exercise Horizontal   Foot Position  seated    Reps  10    Comments  looking left to right consecutively; rest between sets for symptoms to subside (Note: pt reports multiple physicians have looked at his scans and  do not feel he dissected his vertebral artery, therefore OK to resume head turning exercise)         Balance Exercises - 05/20/17 1205      Balance Exercises: Standing   SLS with Vectors  Foam/compliant surface;Intermittent upper extremity assist on blue foam SLS, opp foot turning over/up cone    Balance Beam  horizontal, feet together, eyes closed loses balance to his right; eventually 30 sec    Tandem Gait  Forward;Foam/compliant surface;4 reps blue foam    Sidestepping  Foam/compliant support beam set diagonally in // bars; on toes x 6 reps        PT Education - 05/20/17 1211    Education provided  Yes    Education Details  use of habituation exercise for looking left and right     Person(s) Educated  Patient    Methods  Explanation    Comprehension  Verbalized understanding;Returned  demonstration          PT Long Term Goals - 04/26/17 0949      PT LONG TERM GOAL #1   Title  Patient will be independent with HEP and able to verbalize plans for return to community-based exercise program. (Target all LTGs 05/25/17)    Time  4    Period  Weeks    Status  New    Target Date  05/25/17      PT LONG TERM GOAL #2   Title  Patient will demonstrate lesser fall risk with score of >22 on FGA.    Baseline  04/25/17   21/30    Time  4    Period  Weeks    Status  New      PT LONG TERM GOAL #3   Title  Patient will ambulate independently >1000 ft over level, unlevel outdoor surfaces with <6 inch drift off straight path.     Time  4    Period  Weeks    Status  New      PT LONG TERM GOAL #4   Title  Patient will score >=52 on Berg to indicate lesser fall risk.    Baseline  48 on d/c from CIR    Time  4    Period  Weeks    Status  New            Plan - 05/20/17 1216    Clinical Impression Statement  Session focused on higher level gait skills (running, jumping), balance and coordination training, and vestibular habituation exercises. Patient continues to report head "feels funny" (not spinning) with quick head turns. Overall, pt progressing well and continues to benefit from PT.     PT Treatment/Interventions  ADLs/Self Care Home Management;Gait training;Neuromuscular re-education;Balance training;Therapeutic exercise;Therapeutic activities;Functional mobility training;Stair training;Patient/family education;Vestibular;Visual/perceptual remediation/compensation    PT Next Visit Plan SOT or other challenges on Balancemaster;  balance activities (especially on compliant surface, narrow BOS, and EC); plyometrics on ground vs on mini-trampoline; jump rope   Consulted and Agree with Plan of Care  Patient       Patient will benefit from skilled therapeutic intervention in order to improve the following deficits and impairments:  Abnormal gait, Decreased balance, Decreased  mobility, Decreased coordination, Dizziness, Impaired sensation, Impaired vision/preception, Impaired UE functional use  Visit Diagnosis: Other symptoms and signs involving the nervous system  Other abnormalities of gait and mobility  Dizziness and giddiness     Problem List Patient Active Problem List   Diagnosis Date Noted  .  AKI (acute kidney injury) (HCC)   . Blood glucose elevated   . Generalized anxiety disorder   . Transaminitis   . CVA (cerebral vascular accident) (HCC) 04/17/2017  . 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 Altered sensation due to recent stroke   . Anxiety state   . Attention deficit disorder   . Hypokalemia   . Stroke, Wallenberg's syndrome   . Stroke (HCC) 04/11/2017  . Hyperlipidemia 04/11/2017  . 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 Anxiety     Zena Amos, PT 05/21/2017, 5:06 AM  [facility] [address] [place], [place], [address] Phone: [phone]   Fax:  [phone]  Name: Jake Moore MRN: [id number] Date of Birth: 11-24-1975

## 2017-05-20 NOTE — Patient Instructions (Signed)
Active Neck Rotation    Rotate head to the right and left x 10-20 reps consecutively.  Repeat __3__ times. Do __2-3__ sessions per day.

## 2017-05-22 ENCOUNTER — Encounter: Payer: Self-pay | Admitting: Physical Therapy

## 2017-05-22 ENCOUNTER — Other Ambulatory Visit (HOSPITAL_COMMUNITY): Payer: Self-pay | Admitting: *Deleted

## 2017-05-22 ENCOUNTER — Ambulatory Visit: Payer: BLUE CROSS/BLUE SHIELD | Admitting: Physical Therapy

## 2017-05-22 DIAGNOSIS — R42 Dizziness and giddiness: Secondary | ICD-10-CM

## 2017-05-22 DIAGNOSIS — R2689 Other abnormalities of gait and mobility: Secondary | ICD-10-CM | POA: Diagnosis not present

## 2017-05-22 DIAGNOSIS — R29818 Other symptoms and signs involving the nervous system: Secondary | ICD-10-CM | POA: Diagnosis not present

## 2017-05-22 DIAGNOSIS — R278 Other lack of coordination: Secondary | ICD-10-CM | POA: Diagnosis not present

## 2017-05-22 MED ORDER — CLOPIDOGREL BISULFATE 75 MG PO TABS: 75.0000 mg | ORAL_TABLET | Freq: Every day | ORAL | 6 refills | Status: DC

## 2017-05-22 NOTE — Therapy (Signed)
[facility] [address] [place], [place], [address] Phone: [phone]   Fax:  [phone]  Physical Therapy Treatment  Patient Details  Name: Jake Moore MRN: [id number] Date of Birth: 1975-09-21 Referring Provider: Delle Reining PA; Maryla Morrow MD   Encounter Date: 05/22/2017  PT End of Session - 05/22/17 1159    Visit Number  8    Number of Visits  9    Date for PT Re-Evaluation  06/25/17    Authorization Type  pt reports has HSA/high deductible plan;  no visit limits (per pt); later found to have 30 visit limit)    Authorization - Visit Number  8    Authorization - Number of Visits  30    PT Start Time  [phone]    PT Stop Time  0930    PT Time Calculation (min)  41 min    Activity Tolerance  Patient tolerated treatment well    Behavior During Therapy  [facility] for tasks assessed/performed       Past Medical History:  Diagnosis Date  . Anxiety   . CVA (cerebral vascular accident) [facility])     Past Surgical History:  Procedure Laterality Date  . TEE WITHOUT CARDIOVERSION N/A 04/14/2017   Procedure: TRANSESOPHAGEAL ECHOCARDIOGRAM (TEE);  Surgeon: Dolores Patty, MD;  Location: [facility] ENDOSCOPY;  Service: Cardiovascular;  Laterality: N/A;    There were no vitals filed for this visit.  Subjective Assessment - 05/22/17 0851    Subjective  Really full day yesterday (full day of work and then work dinner). Felt very exhausted and slightly off, but no headache. Still with "funny feeling" (brief) when walking through large warehouse and turning head.    Pertinent History  anxiety/ADD, hyperlipidemia     How long can you stand comfortably?  15 minutes    How long can you walk comfortably?  less than a block    Patient Stated Goals  Want to be able to drive and not feel as dizzy; able exercise again (walking, running, elliptical, weights)    Currently in Pain?  No/denies         [facility] PT Assessment - 05/22/17 0921       Functional Gait  Assessment   Gait Level Surface  Walks 20 ft in less than 5.5 sec, no assistive devices, good speed, no evidence for imbalance, normal gait pattern, deviates no more than 6 in outside of the 12 in walkway width.    Change in Gait Speed  Able to smoothly change walking speed without loss of balance or gait deviation. Deviate no more than 6 in outside of the 12 in walkway width.    Gait with Horizontal Head Turns  Performs head turns smoothly with slight change in gait velocity (eg, minor disruption to smooth gait path), deviates 6-10 in outside 12 in walkway width, or uses an assistive device.    Gait with Vertical Head Turns  Performs head turns with no change in gait. Deviates no more than 6 in outside 12 in walkway width.    Gait and Pivot Turn  Pivot turns safely within 3 sec and stops quickly with no loss of balance.    Step Over Obstacle  Is able to step over 2 stacked shoe boxes taped together (9 in total height) without changing gait speed. No evidence of imbalance.    Gait with Narrow Base of Support  Is able to ambulate for 10 steps heel to toe with no  staggering.    Gait with Eyes Closed  Cannot walk 20 ft without assistance, severe gait deviations or imbalance, deviates greater than 15 in outside 12 in walkway width or will not attempt task.    Ambulating Backwards  Walks 20 ft, uses assistive device, slower speed, mild gait deviations, deviates 6-10 in outside 12 in walkway width.    Steps  Alternating feet, no rail.    Total Score  25         Vestibular Assessment - 05/22/17 0001      Balancemaster   MetallurgistBalancemaster  Sensory organization test       Sensory Organization Testing= 77% compared to age/height normative value of 70%.     Patient demonstrated scores of:   95 for use of somatosensory feedback for balance (compared to age/height normative value of 92),  97 for use of visual feedback for balance (compared to age/height normative value of 74),    55 for use of vestibular feedback for balance (compared to age/height normative value of 54).   COG readings were consistently to right of center (outside normal range).         His use of hip vs ankle strategies were WNL in 16 of 18 trials.                Balance Exercises - 05/22/17 1153      Balance Exercises: Standing   SLS  Eyes open;Foam/compliant surface LLE on airex, rt foot on tall yoga block, bouncing ball         PT Education - 05/22/17 1934    Education provided  Yes    Education Details  examples of single leg stance activities to improve sense of center    Person(s) Educated  Patient    Methods  Explanation;Demonstration    Comprehension  Verbalized understanding          PT Long Term Goals - 05/22/17 1946      PT LONG TERM GOAL #1   Title  Patient will be independent with HEP and able to verbalize plans for return to community-based exercise program. (Target all LTGs 05/25/17)    Time  4    Period  Weeks    Status  New      PT LONG TERM GOAL #2   Title  Patient will demonstrate lesser fall risk with score of >22 on FGA.    Baseline  04/25/17   21/30; 05/22/17  25/30    Time  4    Period  Weeks    Status  Achieved      PT LONG TERM GOAL #3   Title  Patient will ambulate independently >1000 ft over level, unlevel outdoor surfaces with <6 inch drift off straight path.     Time  4    Period  Weeks    Status  New      PT LONG TERM GOAL #4   Title  Patient will score >=52 on Berg to indicate lesser fall risk.    Baseline  49 on d/c from CIR    Time  4    Period  Weeks    Status  New            Plan - 05/22/17 1939    Clinical Impression Statement  Session focused on assessment of vestibular system and beginning to assess LTGs. Patient scored WNL on SOT compared to age-based norms, however it did show his COG is shifted right outside the normal  range. Discussed plan to complete check of LTGs and discharge on next visit.     Rehab  Potential  Excellent    PT Frequency  2x / week    PT Duration  4 weeks    PT Treatment/Interventions  ADLs/Self Care Home Management;Gait training;Neuromuscular re-education;Balance training;Therapeutic exercise;Therapeutic activities;Functional mobility training;Stair training;Patient/family education;Vestibular;Visual/perceptual remediation/compensation    PT Next Visit Plan  check remaining LTGs, update HEP (add VOR x1 back in) and d/c    Consulted and Agree with Plan of Care  Patient       Patient will benefit from skilled therapeutic intervention in order to improve the following deficits and impairments:  Abnormal gait, Decreased balance, Decreased mobility, Decreased coordination, Dizziness, Impaired sensation, Impaired vision/preception, Impaired UE functional use  Visit Diagnosis: Dizziness and giddiness  Other symptoms and signs involving the nervous system     Problem List Patient Active Problem List   Diagnosis Date Noted  . AKI (acute kidney injury) (HCC)   . Blood glucose elevated   . Generalized anxiety disorder   . Transaminitis   . CVA (cerebral vascular accident) (HCC) 04/17/2017  . 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 Altered sensation due to recent stroke   . Anxiety state   . Attention deficit disorder   . Hypokalemia   . Stroke, Wallenberg's syndrome   . Stroke (HCC) 04/11/2017  . Hyperlipidemia 04/11/2017  . 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 Anxiety     Zena Amos, PT 05/22/2017, 7:49 PM  [facility] [address] [place], [place], [address] Phone: [phone]   Fax:  [phone]  Name: Jake Moore MRN: [id number] Date of Birth: Aug 21, 1975

## 2017-05-26 ENCOUNTER — Encounter: Payer: BLUE CROSS/BLUE SHIELD | Admitting: Physical Therapy

## 2017-05-29 ENCOUNTER — Other Ambulatory Visit: Payer: Self-pay

## 2017-05-29 ENCOUNTER — Encounter (HOSPITAL_COMMUNITY): Payer: Self-pay | Admitting: Emergency Medicine

## 2017-05-29 ENCOUNTER — Observation Stay (HOSPITAL_COMMUNITY)
Admission: EM | Admit: 2017-05-29 | Discharge: 2017-05-30 | Disposition: A | Discharge status: Home / Self Care | Payer: BLUE CROSS/BLUE SHIELD | Attending: Family Medicine | Admitting: Family Medicine

## 2017-05-29 ENCOUNTER — Emergency Department (HOSPITAL_COMMUNITY): Payer: BLUE CROSS/BLUE SHIELD

## 2017-05-29 DIAGNOSIS — R29810 Facial weakness: Secondary | ICD-10-CM | POA: Diagnosis not present

## 2017-05-29 DIAGNOSIS — Z87891 Personal history of nicotine dependence: Secondary | ICD-10-CM | POA: Diagnosis not present

## 2017-05-29 DIAGNOSIS — Z8673 Personal history of transient ischemic attack (TIA), and cerebral infarction without residual deficits: Secondary | ICD-10-CM | POA: Insufficient documentation

## 2017-05-29 DIAGNOSIS — Z7982 Long term (current) use of aspirin: Secondary | ICD-10-CM | POA: Diagnosis not present

## 2017-05-29 DIAGNOSIS — G463 Brain stem stroke syndrome: Secondary | ICD-10-CM | POA: Diagnosis present

## 2017-05-29 DIAGNOSIS — I7774 Dissection of vertebral artery: Secondary | ICD-10-CM

## 2017-05-29 DIAGNOSIS — R42 Dizziness and giddiness: Secondary | ICD-10-CM | POA: Insufficient documentation

## 2017-05-29 DIAGNOSIS — I63011 Cerebral infarction due to thrombosis of right vertebral artery: Secondary | ICD-10-CM

## 2017-05-29 DIAGNOSIS — R29818 Other symptoms and signs involving the nervous system: Secondary | ICD-10-CM | POA: Diagnosis not present

## 2017-05-29 DIAGNOSIS — R200 Anesthesia of skin: Principal | ICD-10-CM | POA: Insufficient documentation

## 2017-05-29 DIAGNOSIS — Z7901 Long term (current) use of anticoagulants: Secondary | ICD-10-CM | POA: Insufficient documentation

## 2017-05-29 DIAGNOSIS — Z79899 Other long term (current) drug therapy: Secondary | ICD-10-CM | POA: Insufficient documentation

## 2017-05-29 LAB — COMPREHENSIVE METABOLIC PANEL
ALK PHOS: 60 U/L (ref 38–126)
ALT: 54 U/L (ref 17–63)
ANION GAP: 11 (ref 5–15)
AST: 32 U/L (ref 15–41)
Albumin: 4.4 g/dL (ref 3.5–5.0)
BUN: 12 mg/dL (ref 6–20)
CALCIUM: 9.2 mg/dL (ref 8.9–10.3)
CO2: 24 mmol/L (ref 22–32)
Chloride: 105 mmol/L (ref 101–111)
Creatinine, Ser: 0.96 mg/dL (ref 0.61–1.24)
GFR calc non Af Amer: >60 mL/min (ref >60)
Glucose, Bld: 85 mg/dL (ref 65–99)
Potassium: 3.9 mmol/L (ref 3.5–5.1)
SODIUM: 140 mmol/L (ref 135–145)
TOTAL PROTEIN: 6.5 g/dL (ref 6.5–8.1)
Total Bilirubin: 0.9 mg/dL (ref 0.3–1.2)

## 2017-05-29 LAB — I-STAT CHEM 8, ED
BUN: 14 mg/dL (ref 6–20)
CALCIUM ION: 1.2 mmol/L (ref 1.15–1.40)
Chloride: 102 mmol/L (ref 101–111)
Creatinine, Ser: 0.9 mg/dL (ref 0.61–1.24)
Glucose, Bld: 81 mg/dL (ref 65–99)
HCT: 41 % (ref 39.0–52.0)
HEMOGLOBIN: 13.9 g/dL (ref 13.0–17.0)
Potassium: 4.2 mmol/L (ref 3.5–5.1)
Sodium: 140 mmol/L (ref 135–145)
TCO2: 28 mmol/L (ref 22–32)

## 2017-05-29 LAB — CBC
HCT: 40.8 % (ref 39.0–52.0)
Hemoglobin: 14.2 g/dL (ref 13.0–17.0)
MCH: 31 pg (ref 26.0–34.0)
MCHC: 34.8 g/dL (ref 30.0–36.0)
MCV: 89.1 fL (ref 78.0–100.0)
PLATELETS: 174 10*3/uL (ref 150–400)
RBC: 4.58 MIL/uL (ref 4.22–5.81)
RDW: 11.8 % (ref 11.5–15.5)
WBC: 5 10*3/uL (ref 4.0–10.5)

## 2017-05-29 LAB — DIFFERENTIAL
Basophils Absolute: 0 10*3/uL (ref 0.0–0.1)
Basophils Relative: 0 %
EOS PCT: 3 %
Eosinophils Absolute: 0.1 10*3/uL (ref 0.0–0.7)
Lymphocytes Relative: 26 %
Lymphs Abs: 1.3 10*3/uL (ref 0.7–4.0)
MONO ABS: 0.4 10*3/uL (ref 0.1–1.0)
Monocytes Relative: 9 %
Neutro Abs: 3.1 10*3/uL (ref 1.7–7.7)
Neutrophils Relative %: 62 %

## 2017-05-29 LAB — PROTIME-INR
INR: 1.01
PROTHROMBIN TIME: 13.2 s (ref 11.4–15.2)

## 2017-05-29 LAB — APTT: aPTT: 27 seconds (ref 24–36)

## 2017-05-29 LAB — I-STAT TROPONIN, ED: Troponin i, poc: 0 ng/mL (ref 0.00–0.08)

## 2017-05-29 MED ORDER — CLOPIDOGREL BISULFATE 75 MG PO TABS
75.0000 mg | ORAL_TABLET | Freq: Every day | ORAL | Status: DC
  Administered 2017-05-30: 75 mg via ORAL
  Filled 2017-05-29: qty 1

## 2017-05-29 MED ORDER — ROSUVASTATIN CALCIUM 20 MG PO TABS
20.0000 mg | ORAL_TABLET | Freq: Every evening | ORAL | Status: DC
  Administered 2017-05-29: 20 mg via ORAL
  Filled 2017-05-29: qty 1

## 2017-05-29 MED ORDER — IOPAMIDOL (ISOVUE-370) INJECTION 76%
50.0000 mL | Freq: Once | INTRAVENOUS | Status: AC | PRN
  Administered 2017-05-29: 50 mL via INTRAVENOUS

## 2017-05-29 MED ORDER — SODIUM CHLORIDE 0.9 % IV SOLN
INTRAVENOUS | Status: DC
  Administered 2017-05-30: 1000 mL via INTRAVENOUS

## 2017-05-29 MED ORDER — ASPIRIN EC 81 MG PO TBEC
81.0000 mg | DELAYED_RELEASE_TABLET | Freq: Every day | ORAL | Status: DC
  Administered 2017-05-30: 81 mg via ORAL
  Filled 2017-05-29: qty 1

## 2017-05-29 MED ORDER — ACETAMINOPHEN 650 MG RE SUPP: 650.0000 mg | RECTAL | Status: DC | PRN

## 2017-05-29 MED ORDER — ATOMOXETINE HCL 10 MG PO CAPS
10.0000 mg | ORAL_CAPSULE | Freq: Every day | ORAL | Status: DC
  Filled 2017-05-29: qty 1

## 2017-05-29 MED ORDER — ACETAMINOPHEN 160 MG/5ML PO SOLN: 650.0000 mg | ORAL | Status: DC | PRN

## 2017-05-29 MED ORDER — ACETAMINOPHEN 325 MG PO TABS: 650.0000 mg | ORAL_TABLET | ORAL | Status: DC | PRN

## 2017-05-29 MED ORDER — ENOXAPARIN SODIUM 40 MG/0.4ML ~~LOC~~ SOLN
40.0000 mg | SUBCUTANEOUS | Status: DC
  Administered 2017-05-30: 40 mg via SUBCUTANEOUS
  Filled 2017-05-29: qty 0.4

## 2017-05-29 NOTE — Consult Note (Signed)
Neurology Consultation Reason for Consult: Left facial numbness Referring Physician: Anitra LauthPlunkett, W  CC: Left facial numbness  History is obtained from: Patient, wife  HPI: Jake Moore is a 42 y.o. male who states that he has not been feeling well since last night.  He had an episode of vertigo, and has been dizzy since that time.  He states that he will become dizzy if he moves his eyes quickly, or if he turns his head quickly.  He has been able to walk, however.  Tonight around 4:30 PM, he developed numbness of the left temporal area and due to this he sought care in the emergency department.  Of note, he was admitted in December with a vertebral occlusion.    LKW: 2/6 evening tpa given?: no, outside of window   ROS: A 14 point ROS was performed and is negative except as noted in the HPI.   Past Medical History:  Diagnosis Date  . Anxiety   . CVA (cerebral vascular accident) [facility](HCC)      Family History  Problem Relation Age of Onset  . Other Sister        hypercoagulable state with SCAD  . CAD Maternal Grandfather      Social History:  reports that he quit smoking about 16 months ago. His smoking use included cigarettes. He quit after 20.00 years of use. He has quit using smokeless tobacco. His smokeless tobacco use included chew. He reports that he drinks about 6.0 oz of alcohol per week. He reports that he does not use drugs.   Exam: Current vital signs: BP 124/87   Pulse 80   Temp 99.2 F (37.3 C) (Oral)   Resp 14   Ht 5\' 10"  (1.778 m)   Wt 80.4 kg (177 lb 4 oz)   SpO2 100%   BMI 25.43 kg/m  Vital signs in last 24 hours: Temp:  [99.2 F (37.3 C)] 99.2 F (37.3 C) (02/07 1745) Pulse Rate:  [77-80] 80 (02/07 1845) Resp:  [14-18] 14 (02/07 1845) BP: (118-124)/(87) 124/87 (02/07 1845) SpO2:  [100 %] 100 % (02/07 1845) Weight:  [77.1 kg (170 lb)-80.4 kg (177 lb 4 oz)] 80.4 kg (177 lb 4 oz) (02/07 1802)   Physical Exam  Constitutional: Appears  well-developed and well-nourished.  Psych: Affect appropriate to situation Eyes: No scleral injection HENT: No OP obstrucion Head: Normocephalic.  Cardiovascular: Normal rate and regular rhythm.  Respiratory: Effort normal, non-labored breathing GI: Soft.  No distension. There is no tenderness.  Skin: WDI  Neuro: Mental Status: Patient is awake, alert, oriented to person, place, month, year, and situation. Patient is able to give a clear and coherent history. No signs of aphasia or neglect Cranial Nerves: II: Visual Fields are full. Pupils are equal, round, and reactive to light.   III,IV, VI: EOMI without ptosis or diploplia.  V: Facial sensation is decreased in the whole right side of the face as well as the left approximately V1 distribution VII: Facial movement is questionable for mild flattening of the nasolabial fold on the left, but this is not definite. VIII: hearing is intact to voice X: Uvula elevates symmetrically XI: Shoulder shrug is symmetric. XII: tongue is midline without atrophy or fasciculations.  Motor: Tone is normal. Bulk is normal. 5/5 strength was present in all four extremities.  Sensory: Sensation is symmetric to light touch and temperature in the arms and legs. Deep Tendon Reflexes: 2+ and symmetric in the biceps and patellae.  Plantars: Toes are  downgoing bilaterally.  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  I suspect that he may have had further embolization due to his vertebral dissection.  Likely will need to consider anticoagulation if  he has new areas of infarct.  Recommendations: 1) MRI brain 2) further recommendations pending MRI brain.   Ritta Slot, MD [facility] [phone]  If 7pm- 7am, please page neurology on call as listed in AMION.

## 2017-05-29 NOTE — ED Notes (Signed)
Pt returned from MRI.  Pt's family at bedside.  Pt is still c/o a h/a and L side neck pain.  Pt is A&Ox 4.  MAE without difficulty.  Waiting for dispo

## 2017-05-29 NOTE — Code Documentation (Signed)
Patient with recent CVA in Dec 2018 (vertebral)  persistent deficit:  Right facial numbness.  Last night he had a bout of vertigo at 9pm, since then he has had periods of dizziness.  This afternoon he noticed new onset of left side facial numbness.  Arrived via private vehicle at 1659.  Code Stroke called at 1753. Stat labs and head CT done.  CTA also done.  Dr Amada JupiterKirkpatrick at bedside to assess patient.  NIHSS 1,  Facial numbness.  No Acute treatment.  Plan MRI Neuro checks q 2hr

## 2017-05-29 NOTE — Progress Notes (Signed)
MRI shows no new stroke. Pt still symptomatic. Continues to demonstrated irregulartity prior seen in vert. Recommend obs overnight. Frequent neurochecks. Continue with dual antiplatelets. No anticoagulation for now. Further recs based on clinical course and  stroke team eval in the AM. Plan d/w EDP over phone.  -- Milon DikesAshish Brenten Janney, MD [facility] Pager: [phone] If 7pm to 7am, please call on call as listed on AMION.

## 2017-05-29 NOTE — ED Provider Notes (Signed)
Jake Moore [facility] EMERGENCY DEPARTMENT Provider Note   CSN: [id number] Arrival date & time: 05/29/17  1659     History   Chief Complaint Chief Complaint  Patient presents with  . Code Stroke    HPI Jake Moore is a 42 y.o. male.   Patient presents for facial paresthesias and mild facial droop.  Symptoms have been ongoing for 2 days and have been slightly improving.    Cerebrovascular Accident   This is a new problem. The current episode started 2 days ago. The problem has been gradually improving. Pertinent negatives include no chest pain, no abdominal pain, no headaches and no shortness of breath.  Nothing aggravates the symptoms.  Nothing relieves the symptoms. He has tried nothing for the symptoms.    Past Medical History:  Diagnosis Date  . Anxiety   . CVA (cerebral vascular accident) [facility])     Patient Active Problem List   Diagnosis Date Noted  . AKI (acute kidney injury) (HCC)   . Blood glucose elevated   . Generalized anxiety disorder   . Transaminitis   . CVA (cerebral vascular accident) (HCC) 04/17/2017  . 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 Altered sensation due to recent stroke   . Anxiety state   . Attention deficit disorder   . Hypokalemia   . Stroke, Wallenberg's syndrome   . Stroke (HCC) 04/11/2017  . Hyperlipidemia 04/11/2017  . 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 Anxiety     Past Surgical History:  Procedure Laterality Date  . TEE WITHOUT CARDIOVERSION N/A 04/14/2017   Procedure: TRANSESOPHAGEAL ECHOCARDIOGRAM (TEE);  Surgeon: Dolores Patty, MD;  Location: [facility] ENDOSCOPY;  Service: Cardiovascular;  Laterality: N/A;       Home Medications    Prior to Admission medications   Medication Sig Start Date End Date Taking? Authorizing Provider  aspirin 325 MG tablet Take 1 tablet (325 mg total) by mouth daily. 04/22/17   Love, Evlyn Kanner, PA-C  atomoxetine (STRATTERA) 10 MG capsule Take 10 mg by mouth daily.     [provider]  clopidogrel (PLAVIX) 75 MG tablet Take 1 tablet (75 mg total) by mouth daily. 05/22/17   Laurey Morale, MD  Multiple Vitamin (MULTIVITAMIN) tablet Take 1 tablet by mouth daily.    [provider]  rosuvastatin (CRESTOR) 20 MG tablet Take 1 tablet (20 mg total) by mouth daily. 04/21/17   Jacquelynn Cree, PA-C    Family History Family History  Problem Relation Age of Onset  . Other Sister        hypercoagulable state with SCAD  . CAD Maternal Grandfather     Social History Social History   Tobacco Use  . Smoking status: Former Smoker    Years: 20.00    Types: Cigarettes    Last attempt to quit: 01/15/2016    Years since quitting: 1.3  . Smokeless tobacco: Former Neurosurgeon    Types: Chew  Substance Use Topics  . Alcohol use: Yes    Alcohol/week: 6.0 oz    Types: 10 Cans of beer per week    Comment: social  . Drug use: No     Allergies   Penicillins   Review of Systems Review of Systems  Constitutional: Negative for chills and fever.  HENT: Negative for ear pain and sore throat.   Eyes: Negative for pain and visual disturbance.  Respiratory: Negative for cough and shortness of breath.   Cardiovascular: Negative for  chest pain and palpitations.  Gastrointestinal: Negative for abdominal pain and vomiting.  Genitourinary: Negative for dysuria and hematuria.  Musculoskeletal: Negative for arthralgias and back pain.  Skin: Negative for color change and rash.  Neurological: Negative for seizures, syncope and headaches.  All other systems reviewed and are negative.    Physical Exam Updated Vital Signs BP 118/87 (BP Location: Right Leg)   Pulse 77   Temp 99.2 F (37.3 C) (Oral)   Resp 18   Ht 5\' 10"  (1.778 m)   Wt 80.4 kg (177 lb 4 oz)   SpO2 100%   BMI 25.43 kg/m    Physical Exam  Constitutional: He is oriented to person, place, and time. He appears well-developed and well-nourished.  HENT:  Head: Normocephalic and atraumatic.   Eyes: Conjunctivae and EOM are normal. Pupils are equal, round, and reactive to light.  Neck: Normal range of motion. Neck supple.  Cardiovascular: Normal rate and regular rhythm.  Pulmonary/Chest: Effort normal and breath sounds normal. No respiratory distress.  Abdominal: Soft. There is no tenderness.  Musculoskeletal: He exhibits no edema.  Neurological: He is alert and oriented to person, place, and time.  Skin: Skin is warm and dry.  Psychiatric: He has a normal mood and affect.  Nursing note and vitals reviewed.   Mental Status:  Orientation: Alert and oriented to person, place, and time.  Memory: Cooperative, follows commands well. Recent and remote memory normal.  Attention, Concentration: Attention span and concentration are normal.  Language: Speech is clear and language is normal.  Fund of Knowledge: Aware of current events, vocabulary appropriate for patient age.   Cranial Nerves       II, III, IV, VI:  Pupils equal and reactive to light and near. Full eye movement without nystagmus   V Facial Sensation:  Normal. No weakness of masticatory muscles   VII:  No facial weakness or asymmetry   VIII Auditory Acuity:  Grossly normal   IX/X:  The uvula is midline; the palate elevates symmetrically   XI:  Normal sternocleidomastoid and trapezius strength   XII:  The tongue is midline. No atrophy or fasciculations.      Motor System:  Muscle Strength: 5/5 and symmetric in the upper and lower extremities. No pronation or drift.  Muscle Tone: Tone and muscle bulk are normal in the upper and lower extremities.         Sensation:  Intact to light touch.      Other:        ED Treatments / Results  Labs (all labs ordered are listed, but only abnormal results are displayed) Labs Reviewed  PROTIME-INR  APTT  CBC  DIFFERENTIAL  COMPREHENSIVE METABOLIC PANEL  CBC  CREATININE, SERUM  I-STAT TROPONIN, ED  I-STAT CHEM 8, ED    EKG  EKG  Interpretation  Date/Time:  Thursday May 29 2017 18:23:54 EST Ventricular Rate:  82 PR Interval:    QRS Duration: 89 QT Interval:  397 QTC Calculation: 464 R Axis:   82 Text Interpretation:  Sinus rhythm No significant change since last tracing Confirmed by Gwyneth Sprout ([id number]) on 05/29/2017 6:36:09 PM       Radiology Ct Angio Head W Or Wo Contrast  Result Date: 05/29/2017 CLINICAL DATA:  Acute presentation with left-sided numbness, left facial droop and dizziness. History of old stroke. EXAM: CT ANGIOGRAPHY HEAD AND NECK TECHNIQUE: Multidetector CT imaging of the head and neck was performed using the standard protocol during bolus administration of intravenous  contrast. Multiplanar CT image reconstructions and MIPs were obtained to evaluate the vascular anatomy. Carotid stenosis measurements (when applicable) are obtained utilizing NASCET criteria, using the distal internal carotid diameter as the denominator. CONTRAST:  50mL ISOVUE-370 IOPAMIDOL (ISOVUE-370) INJECTION 76% COMPARISON:  CT earlier same day.  MRI 04/12/2017. FINDINGS: CTA NECK FINDINGS Aortic arch: Normal. No atherosclerosis. Branching pattern of the brachiocephalic vessels from the arch is normal. Right carotid system: Common carotid artery widely patent to the bifurcation. Carotid bifurcation is normal. Cervical ICA is normal. Left carotid system: Common carotid artery widely patent to the bifurcation. Carotid bifurcation is normal. Cervical ICA is normal. Vertebral arteries: Left vertebral artery is dominant. Both vertebral arteries show wide patency at the origin. The left vertebral artery appears normal, supplying the basilar. The right vertebral artery is a small vessel that terminates in PICA. The vessel appears slightly irregular at the C3 level with slight narrowing beyond that. 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 Skeleton: Normal Other neck: Normal Upper chest: Normal Review of the MIP  images confirms the above findings CTA HEAD FINDINGS Anterior circulation: Both internal carotid arteries are widely patent through the skull base and siphon regions. The anterior and middle cerebral vessels are normal without proximal stenosis, aneurysm or vascular malformation. Posterior circulation: As noted above, the left vertebral artery is dominant and is widely patent through the foramen magnum, supplying basilar. No basilar stenosis. Superior cerebellar and posterior cerebral arteries are patent. Patent posterior communicating arteries on each side. The right vertebral artery is a small vessel at the level of the foramen magnum, showing supply to PICA and without visible contribution to the basilar. Venous sinuses: Patent and normal. Anatomic variants: None significant otherwise. Delayed phase: No abnormal enhancement. Review of the MIP images confirms the above findings IMPRESSION: I think the distal right cervical vertebral artery is abnormal. The right vertebral artery is non dominant, terminating in PICA. 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 Vessel does continue to show flow at the present time. No other abnormal vascular finding. Electronically Signed   By: Paulina Fusi M.D.   On: 05/29/2017 19:01   Ct Angio Neck W Or Wo Contrast  Result Date: 05/29/2017 CLINICAL DATA:  Acute presentation with left-sided numbness, left facial droop and dizziness. History of old stroke. EXAM: CT ANGIOGRAPHY HEAD AND NECK TECHNIQUE: Multidetector CT imaging of the head and neck was performed using the standard protocol during bolus administration of intravenous contrast. Multiplanar CT image reconstructions and MIPs were obtained to evaluate the vascular anatomy. Carotid stenosis measurements (when applicable) are obtained utilizing NASCET criteria, using the distal internal carotid  diameter as the denominator. CONTRAST:  50mL ISOVUE-370 IOPAMIDOL (ISOVUE-370) INJECTION 76% COMPARISON:  CT earlier same day.  MRI 04/12/2017. FINDINGS: CTA NECK FINDINGS Aortic arch: Normal. No atherosclerosis. Branching pattern of the brachiocephalic vessels from the arch is normal. Right carotid system: Common carotid artery widely patent to the bifurcation. Carotid bifurcation is normal. Cervical ICA is normal. Left carotid system: Common carotid artery widely patent to the bifurcation. Carotid bifurcation is normal. Cervical ICA is normal. Vertebral arteries: Left vertebral artery is dominant. Both vertebral arteries show wide patency at the origin. The left vertebral artery appears normal, supplying the basilar. The right vertebral artery is a small vessel that terminates in PICA. The vessel appears slightly irregular at the C3 level with slight narrowing beyond  that. 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 Skeleton: Normal Other neck: Normal Upper chest: Normal Review of the MIP images confirms the above findings CTA HEAD FINDINGS Anterior circulation: Both internal carotid arteries are widely patent through the skull base and siphon regions. The anterior and middle cerebral vessels are normal without proximal stenosis, aneurysm or vascular malformation. Posterior circulation: As noted above, the left vertebral artery is dominant and is widely patent through the foramen magnum, supplying basilar. No basilar stenosis. Superior cerebellar and posterior cerebral arteries are patent. Patent posterior communicating arteries on each side. The right vertebral artery is a small vessel at the level of the foramen magnum, showing supply to PICA and without visible contribution to the basilar. Venous sinuses: Patent and normal. Anatomic variants: None significant otherwise. Delayed phase: No abnormal enhancement. Review of the MIP images confirms the above findings IMPRESSION: I think the distal  right cervical vertebral artery is abnormal. The right vertebral artery is non dominant, terminating in PICA. 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 Vessel does continue to show flow at the present time. No other abnormal vascular finding. Electronically Signed   By: Paulina Fusi M.D.   On: 05/29/2017 19:01   Mr Brain Wo Contrast  Result Date: 05/29/2017 CLINICAL DATA:  Vertigo/dizziness. Left facial numbness. Possible vertebral artery dissection. EXAM: MRI HEAD WITHOUT CONTRAST TECHNIQUE: Multiplanar, multiecho pulse sequences of the brain and surrounding structures were obtained without intravenous contrast. COMPARISON:  CTA head neck 05/29/2016 and 04/11/2017 MRI brain 04/12/2017 FINDINGS: Brain: The midline structures are normal. There is no acute infarct or acute hemorrhage. No mass lesion, hydrocephalus, dural abnormality or extra-axial collection. The brain parenchymal signal is normal. No age-advanced or lobar predominant atrophy. No chronic microhemorrhage or superficial siderosis. Vascular: On susceptibility weighted imaging, there is focal blooming at the distal V4 segment of the right vertebral artery, distal to the PICA origin. This is unchanged compared to 04/12/2017. The other major flow voids are preserved. Skull and upper cervical spine: The visualized skull base, calvarium, upper cervical spine and extracranial soft tissues are normal. Sinuses/Orbits: No fluid levels or advanced mucosal thickening. No mastoid or middle ear effusion. Normal orbits. IMPRESSION: 1. No acute intracranial abnormality. Normal appearance of the brain. 2. Focal blooming on susceptibility weighted imaging at the distal aspect of the V4 segment of the right vertebral artery, unchanged compared to 04/12/2017. This may indicate an element of chronic thrombus. Electronically Signed   By: Deatra Robinson M.D.   On: 05/29/2017 20:22   Ct Head Code Stroke Wo Contrast  Result Date: 05/29/2017 CLINICAL DATA:  Code stroke.  Left-sided numbness and facial droop EXAM: CT HEAD WITHOUT CONTRAST TECHNIQUE: Contiguous axial images were obtained from the base of the skull through the vertex without intravenous contrast. COMPARISON:  Head CT 04/13/2017 FINDINGS: Brain: No mass lesion or acute hemorrhage. No focal hypoattenuation of the basal ganglia or cortex to indicate infarcted tissue. No hydrocephalus or age advanced atrophy. Vascular: No hyperdense vessel. No advanced atherosclerotic calcification of the arteries at the skull base. Skull: Normal visualized skull base, calvarium and extracranial soft tissues. Sinuses/Orbits: No sinus fluid levels or advanced mucosal thickening. No mastoid effusion. Normal orbits. ASPECTS [facility] Stroke Program Early CT Score) - Ganglionic level infarction (caudate, lentiform nuclei, internal capsule, insula, M1-M3 cortex): 7 - Supraganglionic infarction (M4-M6 cortex): 3 Total score (0-10 with 10  being normal): 10 IMPRESSION: 1. Normal head CT. 2. ASPECTS is 10. These results were communicated to Dr. McNeill Kirkpatrick at 6:30 pm on 05/29/2017 by text page via the [facility] messaging[facility] system. Electronically Signed   By: Deatra RobinsonKevin  Herman M.D.   On: 05/29/2017 18:31    Procedures Procedures (including critical care time)  Medications Ordered in ED Medications - No data to display   Initial Impression / Assessment and Plan / ED Course  I have reviewed the triage vital signs and the nursing notes.  Pertinent labs & imaging results that were available during my care of the patient were reviewed by me and considered in my medical decision making (see chart for details).     Mr. Glenetta HewMcIntosh is a [age] year old male with a past medical history significant for CVA, anxiety who presents for facial paresthesias and mild facial droop.  On my exam, facial droop is not observed.  Code  stroke initiated from triage.  Neurology evaluated and CT scans were obtained.  Results are significant for possible vertebral artery dissection.  EKG obtained, personally reviewed by me, demonstrates NSR similar to priors.  Code stroke labs obtained and unremarkable.  Neurology recommends MRI, which is performed and demonstrates unchanged right vertebral artery.  Dr. Wilford CornerArora engaged and recommends overnight observation for the patient.  Hospitalist to admit for observation.  Final Clinical Impressions(s) / ED Diagnoses   Final diagnoses:  Numbness  Facial droop    ED Discharge Orders    None      Garey Hamean, Zaylei Mullane E, MD 05/30/17 Marlyne Beards0002  Gwyneth SproutPlunkett, Whitney, MD 06/01/17 2047

## 2017-05-29 NOTE — ED Notes (Signed)
ED Provider at bedside. 

## 2017-05-29 NOTE — ED Notes (Signed)
Placed pt on a hospital bed.  Reason for delay explained to pt and family.

## 2017-05-29 NOTE — ED Notes (Signed)
Went in to do the stroke swallow screen, pt is already eating and drinking.  He states he's eaten a whole sandwich without difficulty.  Negative for dysphagia, diff breathing, or slurred speech.  Toniann FailKakrakandy MD notified

## 2017-05-29 NOTE — ED Triage Notes (Addendum)
Pt reports new L facial numbness with slight L sided facial droop.  No weakness noted, no change to vision.  Pt reports LSN 1630, hx CVA.  Pt reports taking plavix.

## 2017-05-29 NOTE — ED Notes (Signed)
Patient transported to MRI 

## 2017-05-29 NOTE — ED Notes (Addendum)
Pt is resting and appears comfortable.  His father is at bedside.  They are waiting to speak to the hospitalist.  Dr. Toniann FailKakrakandy paged

## 2017-05-29 NOTE — ED Notes (Signed)
Patient presents with existing right sided partial numbness on face with new onset partial facial numbness above left eye.

## 2017-05-29 NOTE — H&P (Signed)
History and Physical    Jake Moore:[id number] DOB: 19-Jul-1975 DOA: 05/29/2017  PCP: Martha Clan, MD  Patient coming from: Home.  Chief Complaint: Dizzy spell.  HPI: Jake Moore is a 42 y.o. male with history of recent admission for stroke and history of ADD presents to the ER because of persistent vertigo-like symptoms since last evening.  Patient states he had a brief episode of vertigo which resolved without any intervention.  Since then whenever he turns his head he feels little dizzy.  He also had some peri-orbital numbness on the left side which resolved.  Patient had some fullness of his neck area for the last 3-4 days.  Denies any weakness of the extremities.  Denies any blurred vision or difficulty speaking or swallowing.  Since his last stroke in December 20 46-month ago patient has followed up with [facility] neurology and cardiology and hematology.  ED Course: In the ER patient had CT angiogram of the head and neck and MRI of the brain which were negative.  Neurology was consulted and at this time to have recommended further observation.  Review of Systems: As per HPI, rest all negative.   Past Medical History:  Diagnosis Date  . Anxiety   . CVA (cerebral vascular accident) [name])     Past Surgical History:  Procedure Laterality Date  . TEE WITHOUT CARDIOVERSION N/A 04/14/2017   Procedure: TRANSESOPHAGEAL ECHOCARDIOGRAM (TEE);  Surgeon: Dolores Patty, MD;  Location: [facility] ENDOSCOPY;  Service: Cardiovascular;  Laterality: N/A;     reports that he quit smoking about 16 months ago. His smoking use included cigarettes. He quit after 20.00 years of use. He has quit using smokeless tobacco. His smokeless tobacco use included chew. He reports that he drinks about 6.0 oz of alcohol per week. He reports that he does not use drugs.  Allergies  Allergen Reactions  . Penicillins Hives    Has patient had a PCN reaction causing immediate rash,  facial/tongue/throat swelling, SOB or lightheadedness with hypotension: YES Has patient had a PCN reaction causing severe rash involving mucus membranes or skin necrosis: NO Has patient had a PCN reaction that required hospitalizationNO Has patient had a PCN reaction occurring within the last 10 years: NO If all of the above answers are "NO", then may proceed with Cephalosporin use.    Family History  Problem Relation Age of Onset  . Other Sister        hypercoagulable state with SCAD  . CAD Maternal Grandfather     Prior to Admission medications   Medication Sig Start Date End Date Taking? Authorizing Provider  aspirin EC 81 MG tablet Take 81 mg by mouth daily.   Yes [provider]  atomoxetine (STRATTERA) 10 MG capsule Take 10 mg by mouth daily.   Yes [provider]  clopidogrel (PLAVIX) 75 MG tablet Take 1 tablet (75 mg total) by mouth daily. 05/22/17  Yes Laurey Morale, MD  rosuvastatin (CRESTOR) 20 MG tablet Take 1 tablet (20 mg total) by mouth daily. Patient taking differently: Take 20 mg by mouth every evening.  04/21/17  Yes Love, Evlyn Kanner, PA-C  aspirin 325 MG tablet Take 1 tablet (325 mg total) by mouth daily. Patient not taking: Reported on 05/29/2017 04/22/17   Jerene Pitch    Physical Exam: Vitals:   05/29/17 2130 05/29/17 2145 05/29/17 2200 05/29/17 2215  BP: 112/85 110/80 114/82 (!) 120/96  Pulse: 71 70 71 82  Resp:  12 10 11 13   Temp:      TempSrc:      SpO2: 100% 99% 100% 99%  Weight:      Height:          Constitutional: Moderately built and nourished. Vitals:   05/29/17 2130 05/29/17 2145 05/29/17 2200 05/29/17 2215  BP: 112/85 110/80 114/82 (!) 120/96  Pulse: 71 70 71 82  Resp: 12 10 11 13   Temp:      TempSrc:      SpO2: 100% 99% 100% 99%  Weight:      Height:       Eyes: Anicteric no pallor. ENMT: No discharge from the ears eyes nose or mouth. Neck: No neck rigidity no mass felt.  No JVD appreciated. Respiratory: No  rhonchi or crepitations. Cardiovascular: S1-S2 heard no murmurs appreciated. Abdomen: Soft nontender bowel sounds present. Musculoskeletal: No edema.  No joint effusion. Skin: No rash.  Skin appears warm. Neurologic: Alert awake oriented to time place and person.  Moves all extremities 5 x 5.  No facial asymmetry tongue is midline. Psychiatric: Appears normal.  Normal affect.   Labs on Admission: I have personally reviewed following labs and imaging studies  CBC: Recent Labs  Lab 05/29/17 1746 05/29/17 1808  WBC 5.0  --   NEUTROABS 3.1  --   HGB 14.2 13.9  HCT 40.8 41.0  MCV 89.1  --   PLT 174  --    Basic Metabolic Panel: Recent Labs  Lab 05/29/17 1746 05/29/17 1808  NA 140 140  K 3.9 4.2  CL 105 102  CO2 24  --   GLUCOSE 85 81  BUN 12 14  CREATININE 0.96 0.90  CALCIUM 9.2  --    GFR: Estimated Creatinine Clearance: 112.7 mL/min (by C-G formula based on SCr of 0.9 mg/dL). Liver Function Tests: Recent Labs  Lab 05/29/17 1746  AST 32  ALT 54  ALKPHOS 60  BILITOT 0.9  PROT 6.5  ALBUMIN 4.4   No results for input(s): LIPASE, AMYLASE in the last 168 hours. No results for input(s): AMMONIA in the last 168 hours. Coagulation Profile: Recent Labs  Lab 05/29/17 1746  INR 1.01   Cardiac Enzymes: No results for input(s): CKTOTAL, CKMB, CKMBINDEX, TROPONINI in the last 168 hours. BNP (last 3 results) No results for input(s): PROBNP in the last 8760 hours. HbA1C: No results for input(s): HGBA1C in the last 72 hours. CBG: No results for input(s): GLUCAP in the last 168 hours. Lipid Profile: No results for input(s): CHOL, HDL, LDLCALC, TRIG, CHOLHDL, LDLDIRECT in the last 72 hours. Thyroid Function Tests: No results for input(s): TSH, T4TOTAL, FREET4, T3FREE, THYROIDAB in the last 72 hours. Anemia Panel: No results for input(s): VITAMINB12, FOLATE, FERRITIN, TIBC, IRON, RETICCTPCT in the last 72 hours. Urine analysis: No results found for: COLORURINE,  APPEARANCEUR, LABSPEC, PHURINE, GLUCOSEU, HGBUR, BILIRUBINUR, KETONESUR, PROTEINUR, UROBILINOGEN, NITRITE, LEUKOCYTESUR Sepsis Labs: @LABRCNTIP (procalcitonin:4,lacticidven:4) )No results found for this or any previous visit (from the past 240 hour(s)).   Radiological Exams on Admission: Ct Angio Head W Or Wo Contrast  Result Date: 05/29/2017 CLINICAL DATA:  Acute presentation with left-sided numbness, left facial droop and dizziness. History of old stroke. EXAM: CT ANGIOGRAPHY HEAD AND NECK TECHNIQUE: Multidetector CT imaging of the head and neck was performed using the standard protocol during bolus administration of intravenous contrast. Multiplanar CT image reconstructions and MIPs were obtained to evaluate the vascular anatomy. Carotid stenosis measurements (when applicable) are obtained utilizing NASCET criteria, using  the distal internal carotid diameter as the denominator. CONTRAST:  50mL ISOVUE-370 IOPAMIDOL (ISOVUE-370) INJECTION 76% COMPARISON:  CT earlier same day.  MRI 04/12/2017. FINDINGS: CTA NECK FINDINGS Aortic arch: Normal. No atherosclerosis. Branching pattern of the brachiocephalic vessels from the arch is normal. Right carotid system: Common carotid artery widely patent to the bifurcation. Carotid bifurcation is normal. Cervical ICA is normal. Left carotid system: Common carotid artery widely patent to the bifurcation. Carotid bifurcation is normal. Cervical ICA is normal. Vertebral arteries: Left vertebral artery is dominant. Both vertebral arteries show wide patency at the origin. The left vertebral artery appears normal, supplying the basilar. The right vertebral artery is a small vessel that terminates in PICA. The vessel appears slightly irregular at the C3 level with slight narrowing beyond that. 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 Skeleton: Normal Other neck: Normal Upper chest: Normal Review of the MIP images confirms the above findings CTA HEAD FINDINGS  Anterior circulation: Both internal carotid arteries are widely patent through the skull base and siphon regions. The anterior and middle cerebral vessels are normal without proximal stenosis, aneurysm or vascular malformation. Posterior circulation: As noted above, the left vertebral artery is dominant and is widely patent through the foramen magnum, supplying basilar. No basilar stenosis. Superior cerebellar and posterior cerebral arteries are patent. Patent posterior communicating arteries on each side. The right vertebral artery is a small vessel at the level of the foramen magnum, showing supply to PICA and without visible contribution to the basilar. Venous sinuses: Patent and normal. Anatomic variants: None significant otherwise. Delayed phase: No abnormal enhancement. Review of the MIP images confirms the above findings IMPRESSION: I think the distal right cervical vertebral artery is abnormal. The right vertebral artery is non dominant, terminating in PICA. 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 Vessel does continue to show flow at the present time. No other abnormal vascular finding. Electronically Signed   By: Paulina FusiMark  Shogry M.D.   On: 05/29/2017 19:01   Ct Angio Neck W Or Wo Contrast  Result Date: 05/29/2017 CLINICAL DATA:  Acute presentation with left-sided numbness, left facial droop and dizziness. History of old stroke. EXAM: CT ANGIOGRAPHY HEAD AND NECK TECHNIQUE: Multidetector CT imaging of the head and neck was performed using the standard protocol during bolus administration of intravenous contrast. Multiplanar CT image reconstructions and MIPs were obtained to evaluate the vascular anatomy. Carotid stenosis measurements (when applicable) are obtained utilizing NASCET criteria, using the distal internal carotid diameter as the denominator. CONTRAST:  50mL ISOVUE-370  IOPAMIDOL (ISOVUE-370) INJECTION 76% COMPARISON:  CT earlier same day.  MRI 04/12/2017. FINDINGS: CTA NECK FINDINGS Aortic arch: Normal. No atherosclerosis. Branching pattern of the brachiocephalic vessels from the arch is normal. Right carotid system: Common carotid artery widely patent to the bifurcation. Carotid bifurcation is normal. Cervical ICA is normal. Left carotid system: Common carotid artery widely patent to the bifurcation. Carotid bifurcation is normal. Cervical ICA is normal. Vertebral arteries: Left vertebral artery is dominant. Both vertebral arteries show wide patency at the origin. The left vertebral artery appears normal, supplying the basilar. The right vertebral artery is a small vessel that terminates in PICA. The vessel appears slightly irregular at the C3 level with slight narrowing beyond that. 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 Skeleton: Normal Other neck: Normal Upper chest: Normal Review of the MIP images confirms the above  findings CTA HEAD FINDINGS Anterior circulation: Both internal carotid arteries are widely patent through the skull base and siphon regions. The anterior and middle cerebral vessels are normal without proximal stenosis, aneurysm or vascular malformation. Posterior circulation: As noted above, the left vertebral artery is dominant and is widely patent through the foramen magnum, supplying basilar. No basilar stenosis. Superior cerebellar and posterior cerebral arteries are patent. Patent posterior communicating arteries on each side. The right vertebral artery is a small vessel at the level of the foramen magnum, showing supply to PICA and without visible contribution to the basilar. Venous sinuses: Patent and normal. Anatomic variants: None significant otherwise. Delayed phase: No abnormal enhancement. Review of the MIP images confirms the above findings IMPRESSION: I think the distal right cervical vertebral artery is abnormal. The right  vertebral artery is non dominant, terminating in PICA. 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 Vessel does continue to show flow at the present time. No other abnormal vascular finding. Electronically Signed   By: Paulina Fusi M.D.   On: 05/29/2017 19:01   Mr Brain Wo Contrast  Result Date: 05/29/2017 CLINICAL DATA:  Vertigo/dizziness. Left facial numbness. Possible vertebral artery dissection. EXAM: MRI HEAD WITHOUT CONTRAST TECHNIQUE: Multiplanar, multiecho pulse sequences of the brain and surrounding structures were obtained without intravenous contrast. COMPARISON:  CTA head neck 05/29/2016 and 04/11/2017 MRI brain 04/12/2017 FINDINGS: Brain: The midline structures are normal. There is no acute infarct or acute hemorrhage. No mass lesion, hydrocephalus, dural abnormality or extra-axial collection. The brain parenchymal signal is normal. No age-advanced or lobar predominant atrophy. No chronic microhemorrhage or superficial siderosis. Vascular: On susceptibility weighted imaging, there is focal blooming at the distal V4 segment of the right vertebral artery, distal to the PICA origin. This is unchanged compared to 04/12/2017. The other major flow voids are preserved. Skull and upper cervical spine: The visualized skull base, calvarium, upper cervical spine and extracranial soft tissues are normal. Sinuses/Orbits: No fluid levels or advanced mucosal thickening. No mastoid or middle ear effusion. Normal orbits. IMPRESSION: 1. No acute intracranial abnormality. Normal appearance of the brain. 2. Focal blooming on susceptibility weighted imaging at the distal aspect of the V4 segment of the right vertebral artery, unchanged compared to 04/12/2017. This may indicate an element of chronic thrombus. Electronically Signed   By: Deatra Robinson M.D.   On: 05/29/2017 20:22   Ct Head Code  Stroke Wo Contrast  Result Date: 05/29/2017 CLINICAL DATA:  Code stroke.  Left-sided numbness and facial droop EXAM: CT HEAD WITHOUT CONTRAST TECHNIQUE: Contiguous axial images were obtained from the base of the skull through the vertex without intravenous contrast. COMPARISON:  Head CT 04/13/2017 FINDINGS: Brain: No mass lesion or acute hemorrhage. No focal hypoattenuation of the basal ganglia or cortex to indicate infarcted tissue. No hydrocephalus or age advanced atrophy. Vascular: No hyperdense vessel. No advanced atherosclerotic calcification of the arteries at the skull base. Skull: Normal visualized skull base, calvarium and extracranial soft tissues. Sinuses/Orbits: No sinus fluid levels or advanced mucosal thickening. No mastoid effusion. Normal orbits. ASPECTS [facility] Stroke Program Early CT Score) - Ganglionic level infarction (caudate, lentiform nuclei, internal capsule, insula, M1-M3 cortex): 7 - Supraganglionic infarction (M4-M6 cortex): 3 Total score (0-10 with 10 being normal): 10 IMPRESSION: 1. Normal head CT. 2. ASPECTS is 10. These results were communicated to Dr. Ritta Slot at 6:30 pm on 05/29/2017  by text page via the [facility] messaging system. Electronically Signed   By: Deatra Robinson M.D.   On: 05/29/2017 18:31    EKG: Independently reviewed.  Normal sinus rhythm.  Assessment/Plan Principal Problem:   Dizziness Active Problems:   Stroke, Wallenberg's syndrome    1. Dizziness/vertigo -appreciate neurology consult.  At this time patient is admitted for further observation.  Continue with aspirin Plavix.  Patient is also on Crestor.  We will closely monitor overnight with neuro checks.  Further recommendations per neurology. 2. History of ADD on Strattera. 3. Recent stroke.   DVT prophylaxis: Lovenox. Code Status: Full code. Family Communication: Patient's father. Disposition Plan: Home. Consults called: Neurology. Admission status: Observation.   Eduard Clos MD [facility] Pager [phone].  If 7PM-7AM, please contact night-coverage [email or web address] Password [facility]  05/29/2017, 10:37 PM

## 2017-05-30 ENCOUNTER — Ambulatory Visit: Payer: BLUE CROSS/BLUE SHIELD | Admitting: Physical Therapy

## 2017-05-30 DIAGNOSIS — R42 Dizziness and giddiness: Secondary | ICD-10-CM | POA: Diagnosis not present

## 2017-05-30 LAB — CBC
HEMATOCRIT: 40 % (ref 39.0–52.0)
HEMOGLOBIN: 13.7 g/dL (ref 13.0–17.0)
MCH: 30.4 pg (ref 26.0–34.0)
MCHC: 34.3 g/dL (ref 30.0–36.0)
MCV: 88.9 fL (ref 78.0–100.0)
Platelets: 170 10*3/uL (ref 150–400)
RBC: 4.5 MIL/uL (ref 4.22–5.81)
RDW: 11.8 % (ref 11.5–15.5)
WBC: 4.7 10*3/uL (ref 4.0–10.5)

## 2017-05-30 LAB — CREATININE, SERUM
CREATININE: 1.05 mg/dL (ref 0.61–1.24)
GFR calc Af Amer: >60 mL/min (ref >60)
GFR calc non Af Amer: >60 mL/min (ref >60)

## 2017-05-30 NOTE — ED Notes (Signed)
Called 3W again to give report.  Receiving nurse is still in a pt's room

## 2017-05-30 NOTE — Progress Notes (Signed)
NEUROHOSPITALISTS STROKE TEAM - DAILY PROGRESS NOTE   ADMISSION HISTORY:  Christen BameRobert F Price is a 42 y.o. male who states that he has not been feeling well since last night.  He had an episode of vertigo, and has been dizzy since that time.  He states that he will become dizzy if he moves his eyes quickly, or if he turns his head quickly.  He has been able to walk, however.  Tonight around 4:30 PM, he developed numbness of the left temporal area and due to this he sought care in the emergency department.   Of note, he was admitted in December with a vertebral occlusion.   LKW: 2/6 evening tpa given?: no, outside of window  SUBJECTIVE (INTERVAL HISTORY) Wife is at the bedside. Patient is found laying in bed in NAD. Overall he feels his condition is gradually improving. Voices no new complaints. No new events reported overnight. He reports he has history of positional vertigo and is seen outpatient therapist and doing vestibular stabilization exercises. He also complains of tightness in his posterior neck muscles   OBJECTIVE Lab Results: CBC:  Recent Labs  Lab 05/29/17 1746 05/29/17 1808 05/29/17 2340  WBC 5.0  --  4.7  HGB 14.2 13.9 13.7  HCT 40.8 41.0 40.0  MCV 89.1  --  88.9  PLT 174  --  170   BMP: Recent Labs  Lab 05/29/17 1746 05/29/17 1808 05/29/17 2340  NA 140 140  --   K 3.9 4.2  --   CL 105 102  --   CO2 24  --   --   GLUCOSE 85 81  --   BUN 12 14  --   CREATININE 0.96 0.90 1.05  CALCIUM 9.2  --   --    Liver Function Tests:  Recent Labs  Lab 05/29/17 1746  AST 32  ALT 54  ALKPHOS 60  BILITOT 0.9  PROT 6.5  ALBUMIN 4.4   Coagulation Studies:  Recent Labs    05/29/17 1746  APTT 27  INR 1.01   PHYSICAL EXAM Temp:  [97.6 F (36.4 C)-99.2 F (37.3 C)] 97.6 F (36.4 C) (02/08 0415) Pulse Rate:  [70-87] 72 (02/08 0415) Resp:  [10-21] 16 (02/08 0415) BP: (94-124)/(64-96) 103/64 (02/08 0415) SpO2:   [95 %-100 %] 97 % (02/08 0415) Weight:  [77.1 kg (170 lb)-80.4 kg (177 lb 4 oz)] 80.4 kg (177 lb 4 oz) (02/07 1802) General - Well nourished, well developed, in no apparent distress HEENT-  Normocephalic, .   Cardiovascular - Regular rate and rhythm  Respiratory - Lungs clear bilaterally. No wheezing. Abdomen - soft and non-tender, BS normal Extremities- no edema or cyanosis Mental Status: Patient is awake, alert, oriented to person, place, month, year, and situation. Patient is able to give a clear and coherent history. No signs of aphasia or neglect Cranial Nerves: II: Visual Fields are full. Pupils are equal, round, and reactive to light.   III,IV, VI: EOMI without ptosis or diploplia.  V: Facial sensation is decreased in the whole right side of the face as well as the left approximately V1 distribution VII: Facial movement is questionable for mild flattening of the nasolabial fold on the left, but this is not definite. VIII: hearing is intact to voice X: Uvula elevates symmetrically XI: Shoulder shrug is symmetric. XII: tongue is midline without atrophy or fasciculations.  Motor: Tone is normal. Bulk is normal. 5/5 strength was present in all four extremities.  Sensory: Sensation is  symmetric to light touch and temperature in the arms and legs. Deep Tendon Reflexes: 2+ and symmetric in the biceps and patellae.  Plantars: Toes are downgoing bilaterally.  Cerebellar: FNF and HKS are intact bilaterally  IMAGING: I have personally reviewed the radiological images below and agree with the radiology interpretations. Ct Angio Head W Or Wo Contrast  Result Date: 05/29/2017 CLINICAL DATA:  Acute presentation with left-sided numbness, left facial droop and dizziness. History of old stroke. EXAM: CT ANGIOGRAPHY HEAD AND NECK TECHNIQUE: Multidetector CT imaging of the head and neck was performed using the standard protocol during bolus administration of intravenous contrast. Multiplanar CT  image reconstructions and MIPs were obtained to evaluate the vascular anatomy. Carotid stenosis measurements (when applicable) are obtained utilizing NASCET criteria, using the distal internal carotid diameter as the denominator. CONTRAST:  50mL ISOVUE-370 IOPAMIDOL (ISOVUE-370) INJECTION 76% COMPARISON:  CT earlier same day.  MRI 04/12/2017. FINDINGS: CTA NECK FINDINGS Aortic arch: Normal. No atherosclerosis. Branching pattern of the brachiocephalic vessels from the arch is normal. Right carotid system: Common carotid artery widely patent to the bifurcation. Carotid bifurcation is normal. Cervical ICA is normal. Left carotid system: Common carotid artery widely patent to the bifurcation. Carotid bifurcation is normal. Cervical ICA is normal. Vertebral arteries: Left vertebral artery is dominant. Both vertebral arteries show wide patency at the origin. The left vertebral artery appears normal, supplying the basilar. The right vertebral artery is a small vessel that terminates in PICA. The vessel appears slightly irregular at the C3 level with slight narrowing beyond that. 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 Skeleton: Normal Other neck: Normal Upper chest: Normal Review of the MIP images confirms the above findings CTA HEAD FINDINGS Anterior circulation: Both internal carotid arteries are widely patent through the skull base and siphon regions. The anterior and middle cerebral vessels are normal without proximal stenosis, aneurysm or vascular malformation. Posterior circulation: As noted above, the left vertebral artery is dominant and is widely patent through the foramen magnum, supplying basilar. No basilar stenosis. Superior cerebellar and posterior cerebral arteries are patent. Patent posterior communicating arteries on each side. The right vertebral artery is a small vessel at the level of the foramen magnum, showing supply to PICA and without visible contribution to the basilar.  Venous sinuses: Patent and normal. Anatomic variants: None significant otherwise. Delayed phase: No abnormal enhancement. Review of the MIP images confirms the above findings IMPRESSION: I think the distal right cervical vertebral artery is abnormal. The right vertebral artery is non dominant, terminating in PICA. 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 Vessel does continue to show flow at the present time. No other abnormal vascular finding. Electronically Signed   By: Paulina Fusi M.D.   On: 05/29/2017 19:01   Ct Angio Neck W Or Wo Contrast  Result Date: 05/29/2017 CLINICAL DATA:  Acute presentation with left-sided numbness, left facial droop and dizziness. History of old stroke. EXAM: CT ANGIOGRAPHY HEAD AND NECK TECHNIQUE: Multidetector CT imaging of the head and neck was performed using the standard protocol during bolus administration of intravenous contrast. Multiplanar CT image reconstructions and MIPs were obtained to evaluate the vascular anatomy. Carotid stenosis measurements (when applicable) are obtained utilizing NASCET criteria, using the distal internal carotid diameter as the denominator. CONTRAST:  50mL ISOVUE-370 IOPAMIDOL (ISOVUE-370) INJECTION 76% COMPARISON:  CT earlier same day.  MRI 04/12/2017. FINDINGS: CTA  NECK FINDINGS Aortic arch: Normal. No atherosclerosis. Branching pattern of the brachiocephalic vessels from the arch is normal. Right carotid system: Common carotid artery widely patent to the bifurcation. Carotid bifurcation is normal. Cervical ICA is normal. Left carotid system: Common carotid artery widely patent to the bifurcation. Carotid bifurcation is normal. Cervical ICA is normal. Vertebral arteries: Left vertebral artery is dominant. Both vertebral arteries show wide patency at the origin. The left vertebral artery appears normal, supplying the  basilar. The right vertebral artery is a small vessel that terminates in PICA. The vessel appears slightly irregular at the C3 level with slight narrowing beyond that. 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 Skeleton: Normal Other neck: Normal Upper chest: Normal Review of the MIP images confirms the above findings CTA HEAD FINDINGS Anterior circulation: Both internal carotid arteries are widely patent through the skull base and siphon regions. The anterior and middle cerebral vessels are normal without proximal stenosis, aneurysm or vascular malformation. Posterior circulation: As noted above, the left vertebral artery is dominant and is widely patent through the foramen magnum, supplying basilar. No basilar stenosis. Superior cerebellar and posterior cerebral arteries are patent. Patent posterior communicating arteries on each side. The right vertebral artery is a small vessel at the level of the foramen magnum, showing supply to PICA and without visible contribution to the basilar. Venous sinuses: Patent and normal. Anatomic variants: None significant otherwise. Delayed phase: No abnormal enhancement. Review of the MIP images confirms the above findings IMPRESSION: I think the distal right cervical vertebral artery is abnormal. The right vertebral artery is non dominant, terminating in PICA. 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 Vessel does continue to show flow at the present time. No other abnormal vascular finding. Electronically Signed   By: Paulina Fusi M.D.   On: 05/29/2017 19:01   Mr Brain Wo Contrast  Result Date: 05/29/2017 CLINICAL DATA:  Vertigo/dizziness. Left facial numbness. Possible vertebral artery dissection. EXAM: MRI HEAD WITHOUT CONTRAST TECHNIQUE: Multiplanar, multiecho pulse sequences of the brain and surrounding structures were obtained  without intravenous contrast. COMPARISON:  CTA head neck 05/29/2016 and 04/11/2017 MRI brain 04/12/2017 FINDINGS: Brain: The midline structures are normal. There is no acute infarct or acute hemorrhage. No mass lesion, hydrocephalus, dural abnormality or extra-axial collection. The brain parenchymal signal is normal. No age-advanced or lobar predominant atrophy. No chronic microhemorrhage or superficial siderosis. Vascular: On susceptibility weighted imaging, there is focal blooming at the distal V4 segment of the right vertebral artery, distal to the PICA origin. This is unchanged compared to 04/12/2017. The other major flow voids are preserved. Skull and upper cervical spine: The visualized skull base, calvarium, upper cervical spine and extracranial soft tissues are normal. Sinuses/Orbits: No fluid levels or advanced mucosal thickening. No mastoid or middle ear effusion. Normal orbits. IMPRESSION: 1. No acute intracranial abnormality. Normal appearance of the brain. 2. Focal blooming on susceptibility weighted imaging at the distal aspect of the V4 segment of the right vertebral artery, unchanged compared to 04/12/2017. This may indicate an element of chronic thrombus. Electronically Signed   By: Deatra Robinson M.D.   On: 05/29/2017 20:22   Ct Head Code Stroke Wo Contrast  Result Date: 05/29/2017 CLINICAL DATA:  Code stroke.  Left-sided numbness and facial droop EXAM: CT HEAD WITHOUT CONTRAST TECHNIQUE: Contiguous axial images were obtained from the base of the skull through the  vertex without intravenous contrast. COMPARISON:  Head CT 04/13/2017 FINDINGS: Brain: No mass lesion or acute hemorrhage. No focal hypoattenuation of the basal ganglia or cortex to indicate infarcted tissue. No hydrocephalus or age advanced atrophy. Vascular: No hyperdense vessel. No advanced atherosclerotic calcification of the arteries at the skull base. Skull: Normal visualized skull base, calvarium and extracranial soft tissues.  Sinuses/Orbits: No sinus fluid levels or advanced mucosal thickening. No mastoid effusion. Normal orbits. ASPECTS [facility] Stroke Program Early CT Score) - Ganglionic level infarction (caudate, lentiform nuclei, internal capsule, insula, M1-M3 cortex): 7 - Supraganglionic infarction (M4-M6 cortex): 3 Total score (0-10 with 10 being normal): 10 IMPRESSION: 1. Normal head CT. 2. ASPECTS is 10. These results were communicated to Dr. Ritta Slot at 6:30 pm on 05/29/2017 by text page via the [facility] messaging system. Electronically Signed   By: Deatra Robinson M.D.   On: 05/29/2017 18:31      IMPRESSION: Mr. [place] GROSSER is a 42 y.o. 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  MRI reveals no acute findings. 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 May  slowly start B/P medications, if necessary Home Meds: NONE  HYPERLIPIDEMIA:    Component Value Date/Time   CHOL 141 04/12/2017 0254   TRIG 65 04/12/2017 0254   HDL 71 04/12/2017 0254   CHOLHDL 2.0 04/12/2017 0254   VLDL 13 04/12/2017 0254   LDLCALC 57 04/12/2017 0254  Home Meds:  Crestor 20 mg LDL  goal < 70 Continued on  Crestor 20 mg daily Continue statin at discharge  R/O DIABETES: Lab Results  Component Value Date   HGBA1C 5.2 04/12/2017  HgbA1c goal < 7.0  Other Active Problems: Principal Problem:   Dizziness Active Problems:   Stroke, [facility] syndrome    Hospital day # 0 VTE prophylaxis: Lovenox  Diet : Fall precautions Diet Heart Room service appropriate? Yes; Fluid consistency: Thin   FAMILY UPDATES:  family at bedside  TEAM UPDATES: Shon Hale, MD   Prior Home Stroke Medications:  aspirin 81 mg daily and clopidogrel 75 mg daily  Discharge Stroke Meds:  Please discharge patient on aspirin 81 mg daily and clopidogrel 75 mg daily   Disposition: 01-Home or Self Care Therapy Recs:               PENDING Follow Up:  Follow-up Information    Micki Riley, MD. Schedule an appointment as soon as possible for a visit in 6 week(s).   Specialties:  Neurology, Radiology Contact  information: [address] [place] [address] [phone]          Martha Clan, MD -PCP Follow up in 1-2 weeks      Assessment & plan discussed with with attending physician and they are in agreement.    Beryl Meager, ANP-C Stroke Neurology Team 05/30/2017 10:36 AM I have personally examined this patient, reviewed notes, independently viewed imaging studies, participated in medical decision making and plan of care.ROS completed by me personally and pertinent positives fully documented  I have made any additions or clarifications directly to the above note. Agree with note above. He presented with transient positional vertigo likely due to peripheral  labyrinthine dysfunction. He also has some transient facial numbness of unclear etiology which has resolved. MRI shows no acute infarct and since CT angiogram shows partial recanalization of the previously occluded intracranial vertebral artery. I had a long discussion the patient and his wife and Dr. Marisa Severin and answered questions about his care.Patient counseled to do vestibular stabilization exercises as well as neck stretching exercises and participate in regular stress relaxation activities . He was also counseled to drink enough fluids and maintain adequate hydration. Greater than 50% time during this 35 minute visit was spent on counseling and coordination of care about his positional vertigo, stroke and TIA risk and answering questions.  Delia Heady, MD Medical Director [facility] Pager: [phone] 05/30/2017 12:04 PM  Neurology to sign-off at this time. Please call with any further questions or concerns. Thank you for this consultation.  To contact Stroke Continuity provider, please refer to [email or web address]. After hours, contact General Neurology

## 2017-05-30 NOTE — Care Management Note (Signed)
Case Management Note  Patient Details  Name: Christen BameRobert F Cowley MRN: [id number] Date of Birth: 07/01/1975  Subjective/Objective:      Pt in with dizziness. He is from home with spouse.              Action/Plan: Pt discharging home and [facility]. Orders re entered into Epic.  Pt has transportation home. PCP: Dr Clelia CroftShaw Insurance: BCBS  Expected Discharge Date:  05/30/17               Expected Discharge Plan:  OP Rehab  In-House Referral:     Discharge planning Services  CM Consult  Post Acute Care Choice:    Choice offered to:     DME Arranged:    DME Agency:     HH Arranged:    HH Agency:     Status of Service:  Completed, signed off  If discussed at [employer] Length of Stay Meetings, dates discussed:    Additional Comments:  Kermit BaloKelli F Daaiyah Baumert, RN 05/30/2017, 1:23 PM

## 2017-05-30 NOTE — ED Notes (Signed)
Called 3W for report.  Receiving nurse is in a pt's room.

## 2017-05-30 NOTE — Progress Notes (Signed)
OT Cancellation Note  Patient Details Name: Jake BameRobert F Prohaska MRN: [id number] DOB: 02/26/1976   Cancelled Treatment:    Reason Eval/Treat Not Completed: OT screened, no needs identified, will sign off; spoke with PT and with Pt, Pt reports feeling back at his baseline and with no OT needs, has been completing outpatient therapy and will be continuing after d/c. OT referral is appreciated, will sign off at this time.   Marcy SirenBreanna Adel Burch, OT Pager [phone] 05/30/2017  Orlando PennerBreanna L Huel Centola 05/30/2017, 11:32 AM

## 2017-05-30 NOTE — Discharge Summary (Signed)
Jake Moore, is a 42 y.o. male  DOB [birth year]  MRN [id number].  Admission date:  05/29/2017  Admitting Physician  Eduard Clos, MD  Discharge Date:  05/30/2017   Primary MD  Martha Clan, MD  Recommendations for primary care physician for things to follow:   1)You did not have a new stroke this time around 2) take Crestor as previously prescribed 3) take aspirin 81 mg daily along with Plavix 75 mg daily for another 2 months, then  you may stop the Plavix after a total of 3 months of combined aspirin/Plavix Therapy from the original date and continue just the aspirin 81 mg daily indefinitely after that 4) keep your outpatient neurology appointment as advised   Admission Diagnosis  Numbness [R20.0] Facial droop [R29.810] Dizziness [R42]   Discharge Diagnosis  Numbness [R20.0] Facial droop [R29.810] Dizziness [R42]    Principal Problem:   Dizziness Active Problems:   Stroke, Wallenberg's syndrome      Past Medical History:  Diagnosis Date  . Anxiety   . CVA (cerebral vascular accident) [facility])     Past Surgical History:  Procedure Laterality Date  . TEE WITHOUT CARDIOVERSION N/A 04/14/2017   Procedure: TRANSESOPHAGEAL ECHOCARDIOGRAM (TEE);  Surgeon: Dolores Patty, MD;  Location: [facility] ENDOSCOPY;  Service: Cardiovascular;  Laterality: N/A;     HPI  from the history and physical done on the day of admission:     Chief Complaint: Dizzy spell.  HPI: Jake Moore is a 42 y.o. male with history of recent admission for stroke and history of ADD presents to the ER because of persistent vertigo-like symptoms since last evening.  Patient states he had a brief episode of vertigo which resolved without any intervention.  Since then whenever he turns his head he feels little dizzy.  He also had some peri-orbital numbness on the left side which resolved.  Patient had some fullness of  his neck area for the last 3-4 days.  Denies any weakness of the extremities.  Denies any blurred vision or difficulty speaking or swallowing.  Since his last stroke in December 20 59-month ago patient has followed up with [facility] neurology and cardiology and hematology.  ED Course: In the ER patient had CT angiogram of the head and neck and MRI of the brain which were negative.  Neurology was consulted and at this time to have recommended further observation.      Hospital Course:    1)Dizziness/vertigo -appreciate neurology consult, neuro imaging reveals no new acute stroke at this time, c/n  Crestor,c/n aspirin 81 mg daily along with Plavix 75 mg daily for another 2 months, then    stop the Plavix after a total of 3 months of combined aspirin/Plavix Therapy from the original date and continue just the aspirin 81 mg daily indefinitely after that, d/w Dr Pearlean Brownie prior to discharge   2)History of ADD - c/n Strattera.  3)H/o Recent CVA-treatment as above #1   Discharge Condition: stable  Follow UP  Follow-up  Information    Jake Riley, MD. Schedule an appointment as soon as possible for a visit in 6 week(s).   Specialties:  Neurology, Radiology Contact information: [address] [place] [address] [phone]            Consults obtained - Neuro  Diet and Activity recommendation:  As advised  Discharge Instructions    Discharge Instructions    Ambulatory referral to Neurology   Complete by:  As directed    An appointment is requested in approximately: 6 weeks Follow up with stroke clinic (Dr Pearlean Brownie preferred, if not available, then consider Sylvie Farrier, [facility] or Lucia Gaskins whoever is available) at [facility] in about 6-8 weeks. Thanks.   Ambulatory referral to Physical Therapy   Complete by:  As directed    Pt to continue outpatient PT per MD   Call MD for:  difficulty breathing, headache or visual disturbances   Complete by:  As directed    Call MD  for:  persistant dizziness or light-headedness   Complete by:  As directed    Call MD for:  persistant nausea and vomiting   Complete by:  As directed    Call MD for:  severe uncontrolled pain   Complete by:  As directed    Call MD for:  temperature >100.4   Complete by:  As directed    Diet - low sodium heart healthy   Complete by:  As directed    Discharge instructions   Complete by:  As directed    1)You did not have a new stroke this time around 2) take Crestor as previously prescribed 3) take aspirin 81 mg daily along with Plavix 75 mg daily for another 2 months, then  you may stop the Plavix after a total of 3 months of combined aspirin/Plavix Therapy from the original date and continue just the aspirin 81 mg daily indefinitely after that 4) keep your outpatient neurology appointment as advised   Increase activity slowly   Complete by:  As directed         Discharge Medications     Allergies as of 05/30/2017      Reactions   Penicillins Hives   Has patient had a PCN reaction causing immediate rash, facial/tongue/throat swelling, SOB or lightheadedness with hypotension: YES Has patient had a PCN reaction causing severe rash involving mucus membranes or skin necrosis: NO Has patient had a PCN reaction that required hospitalizationNO Has patient had a PCN reaction occurring within the last 10 years: NO If all of the above answers are "NO", then may proceed with Cephalosporin use.      Medication List    TAKE these medications   aspirin EC 81 MG tablet Take 81 mg by mouth daily. What changed:  Another medication with the same name was removed. Continue taking this medication, and follow the directions you see here.   atomoxetine 10 MG capsule Commonly known as:  STRATTERA Take 10 mg by mouth daily.   clopidogrel 75 MG tablet Commonly known as:  PLAVIX Take 1 tablet (75 mg total) by mouth daily.   rosuvastatin 20 MG tablet Commonly known as:  CRESTOR Take 1 tablet  (20 mg total) by mouth daily. What changed:  when to take this       Major procedures and Radiology Reports - PLEASE review detailed and final reports for all details, in brief -  Ct Angio Head W Or Wo Contrast  Result Date: 05/29/2017 CLINICAL DATA:  Acute presentation with left-sided numbness, left facial droop and dizziness. History of old stroke. EXAM: CT ANGIOGRAPHY HEAD AND NECK TECHNIQUE: Multidetector CT imaging of the head and neck was performed using the standard protocol during bolus administration of intravenous contrast. Multiplanar CT image reconstructions and MIPs were obtained to evaluate the vascular anatomy. Carotid stenosis measurements (when applicable) are obtained utilizing NASCET criteria, using the distal internal carotid diameter as the denominator. CONTRAST:  50mL ISOVUE-370 IOPAMIDOL (ISOVUE-370) INJECTION 76% COMPARISON:  CT earlier same day.  MRI 04/12/2017. FINDINGS: CTA NECK FINDINGS Aortic arch: Normal. No atherosclerosis. Branching pattern of the brachiocephalic vessels from the arch is normal. Right carotid system: Common carotid artery widely patent to the bifurcation. Carotid bifurcation is normal. Cervical ICA is normal. Left carotid system: Common carotid artery widely patent to the bifurcation. Carotid bifurcation is normal. Cervical ICA is normal. Vertebral arteries: Left vertebral artery is dominant. Both vertebral arteries show wide patency at the origin. The left vertebral artery appears normal, supplying the basilar. The right vertebral artery is a small vessel that terminates in PICA. The vessel appears slightly irregular at the C3 level with slight narrowing beyond that. 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 Skeleton: Normal Other neck: Normal Upper chest: Normal Review of the MIP images confirms the above findings CTA HEAD FINDINGS Anterior circulation: Both internal carotid arteries are widely patent through the skull base and siphon  regions. The anterior and middle cerebral vessels are normal without proximal stenosis, aneurysm or vascular malformation. Posterior circulation: As noted above, the left vertebral artery is dominant and is widely patent through the foramen magnum, supplying basilar. No basilar stenosis. Superior cerebellar and posterior cerebral arteries are patent. Patent posterior communicating arteries on each side. The right vertebral artery is a small vessel at the level of the foramen magnum, showing supply to PICA and without visible contribution to the basilar. Venous sinuses: Patent and normal. Anatomic variants: None significant otherwise. Delayed phase: No abnormal enhancement. Review of the MIP images confirms the above findings IMPRESSION: I think the distal right cervical vertebral artery is abnormal. The right vertebral artery is non dominant, terminating in PICA. 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 Vessel does continue to show flow at the present time. No other abnormal vascular finding. Electronically Signed   By: Paulina FusiMark  Shogry M.D.   On: 05/29/2017 19:01   Ct Angio Neck W Or Wo Contrast  Result Date: 05/29/2017 CLINICAL DATA:  Acute presentation with left-sided numbness, left facial droop and dizziness. History of old stroke. EXAM: CT ANGIOGRAPHY HEAD AND NECK TECHNIQUE: Multidetector CT imaging of the head and neck was performed using the standard protocol during bolus administration of intravenous contrast. Multiplanar CT image reconstructions and MIPs were obtained to evaluate the vascular anatomy. Carotid stenosis measurements (when applicable) are obtained utilizing NASCET criteria, using the distal internal carotid diameter as the denominator. CONTRAST:  50mL ISOVUE-370 IOPAMIDOL (ISOVUE-370) INJECTION 76% COMPARISON:  CT earlier same day.  MRI 04/12/2017. FINDINGS: CTA NECK  FINDINGS Aortic arch: Normal. No atherosclerosis. Branching pattern of the brachiocephalic vessels from the arch is normal. Right carotid system: Common carotid artery widely patent to the bifurcation. Carotid bifurcation is normal. Cervical ICA is normal. Left carotid system: Common carotid artery widely patent to the bifurcation. Carotid bifurcation is normal. Cervical ICA is normal. Vertebral arteries: Left vertebral artery is dominant. Both vertebral arteries  show wide patency at the origin. The left vertebral artery appears normal, supplying the basilar. The right vertebral artery is a small vessel that terminates in PICA. The vessel appears slightly irregular at the C3 level with slight narrowing beyond that. 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 Skeleton: Normal Other neck: Normal Upper chest: Normal Review of the MIP images confirms the above findings CTA HEAD FINDINGS Anterior circulation: Both internal carotid arteries are widely patent through the skull base and siphon regions. The anterior and middle cerebral vessels are normal without proximal stenosis, aneurysm or vascular malformation. Posterior circulation: As noted above, the left vertebral artery is dominant and is widely patent through the foramen magnum, supplying basilar. No basilar stenosis. Superior cerebellar and posterior cerebral arteries are patent. Patent posterior communicating arteries on each side. The right vertebral artery is a small vessel at the level of the foramen magnum, showing supply to PICA and without visible contribution to the basilar. Venous sinuses: Patent and normal. Anatomic variants: None significant otherwise. Delayed phase: No abnormal enhancement. Review of the MIP images confirms the above findings IMPRESSION: I think the distal right cervical vertebral artery is abnormal. The right vertebral artery is non dominant, terminating in PICA. 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 Vessel does continue to show flow at the present time. No other abnormal vascular finding. Electronically Signed   By: Paulina Fusi M.D.   On: 05/29/2017 19:01   Mr Brain Wo Contrast  Result Date: 05/29/2017 CLINICAL DATA:  Vertigo/dizziness. Left facial numbness. Possible vertebral artery dissection. EXAM: MRI HEAD WITHOUT CONTRAST TECHNIQUE: Multiplanar, multiecho pulse sequences of the brain and surrounding structures were obtained without intravenous contrast. COMPARISON:  CTA head neck 05/29/2016 and 04/11/2017 MRI brain 04/12/2017 FINDINGS: Brain: The midline structures are normal. There is no acute infarct or acute hemorrhage. No mass lesion, hydrocephalus, dural abnormality or extra-axial collection. The brain parenchymal signal is normal. No age-advanced or lobar predominant atrophy. No chronic microhemorrhage or superficial siderosis. Vascular: On susceptibility weighted imaging, there is focal blooming at the distal V4 segment of the right vertebral artery, distal to the PICA origin. This is unchanged compared to 04/12/2017. The other major flow voids are preserved. Skull and upper cervical spine: The visualized skull base, calvarium, upper cervical spine and extracranial soft tissues are normal. Sinuses/Orbits: No fluid levels or advanced mucosal thickening. No mastoid or middle ear effusion. Normal orbits. IMPRESSION: 1. No acute intracranial abnormality. Normal appearance of the brain. 2. Focal blooming on susceptibility weighted imaging at the distal aspect of the V4 segment of the right vertebral artery, unchanged compared to 04/12/2017. This may indicate an element of chronic thrombus. Electronically Signed   By: Deatra Robinson M.D.   On: 05/29/2017 20:22   Ct Head Code Stroke Wo Contrast  Result Date: 05/29/2017 CLINICAL DATA:  Code stroke.  Left-sided numbness and facial droop  EXAM: CT HEAD WITHOUT CONTRAST TECHNIQUE: Contiguous axial images were obtained from the base of the skull through the vertex without intravenous contrast. COMPARISON:  Head CT 04/13/2017 FINDINGS: Brain: No mass lesion or acute hemorrhage. No focal hypoattenuation of the basal ganglia or cortex to indicate infarcted tissue. No hydrocephalus or age advanced atrophy. Vascular: No hyperdense vessel. No advanced atherosclerotic calcification of the arteries at the skull base. Skull: Normal visualized skull base, calvarium and extracranial soft tissues. Sinuses/Orbits: No sinus fluid levels  or advanced mucosal thickening. No mastoid effusion. Normal orbits. ASPECTS [facility] Stroke Program Early CT Score) - Ganglionic level infarction (caudate, lentiform nuclei, internal capsule, insula, M1-M3 cortex): 7 - Supraganglionic infarction (M4-M6 cortex): 3 Total score (0-10 with 10 being normal): 10 IMPRESSION: 1. Normal head CT. 2. ASPECTS is 10. These results were communicated to Dr. Ritta Slot at 6:30 pm on 05/29/2017 by text page via the [facility] messaging system. Electronically Signed   By: Deatra Robinson M.D.   On: 05/29/2017 18:31    Micro Results   No results found for this or any previous visit (from the past 240 hour(s)).     Today   Subjective    Norvil Martensen today has no new complaints, dizziness/vertigo and facial numbness is much better, wife at bedside, questions answered          Patient has been seen and examined prior to discharge   Objective   Blood pressure 103/64, pulse 72, temperature 97.6 F (36.4 C), temperature source Oral, resp. rate 16, height 5\' 10"  (1.778 m), weight 80.4 kg (177 lb 4 oz), SpO2 97 %.   Intake/Output Summary (Last 24 hours) at 05/30/2017 1855 Last data filed at 05/30/2017 0317 Gross per 24 hour  Intake 0 ml  Output -  Net 0 ml    Exam Gen:- Awake  In no apparent distress  HEENT:- [place].AT,   Neck-Supple Neck,No JVD,  Lungs- mostly clear  CV- S1,  S2 normal Abd-  +ve B.Sounds, Abd Soft, No tenderness,    Extremity/Skin:- Intact peripheral pulses   Neuro-no new focal deficits, facial numbness is better, no facial asymmetry Psych-affect is appropriate   Data Review   CBC w Diff:  Lab Results  Component Value Date   WBC 4.7 05/29/2017   HGB 13.7 05/29/2017   HCT 40.0 05/29/2017   PLT 170 05/29/2017   LYMPHOPCT 26 05/29/2017   MONOPCT 9 05/29/2017   EOSPCT 3 05/29/2017   BASOPCT 0 05/29/2017    CMP:  Lab Results  Component Value Date   NA 140 05/29/2017   K 4.2 05/29/2017   CL 102 05/29/2017   CO2 24 05/29/2017   BUN 14 05/29/2017   CREATININE 1.05 05/29/2017   PROT 6.5 05/29/2017   ALBUMIN 4.4 05/29/2017   BILITOT 0.9 05/29/2017   ALKPHOS 60 05/29/2017   AST 32 05/29/2017   ALT 54 05/29/2017  .   Total Discharge time is about 33 minutes  Shon Hale M.D on 05/30/2017 at 6:55 PM  [facility]   Office  [phone]  Voice Recognition Reubin Milan dictation system was used to create this note, attempts have been made to correct errors. Please contact the author with questions and/or clarifications.

## 2017-05-30 NOTE — ED Notes (Signed)
Pt reports hospitalist went in to talk to him and his father.

## 2017-05-30 NOTE — Evaluation (Addendum)
Physical Therapy Evaluation Patient Details Name: Jake Moore MRN: [id number] DOB: 10/24/1975 Today's Date: 05/30/2017   History of Present Illness  Patient is a 42 y/o male who presents with left sided facial numbness and an episode of vertigo. 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 NIH:1. MRI and CTA- unremarkable. PMH includes CVA, anxiety,   Clinical Impression  Patient presents close to functional baseline and able to ambulate community distances, negotiates steps and perform higher level balance challenges without difficulty. Tolerated tandem walking with supervision and SLS with some difficulty but currently going to vestibular outpatient PT to address these higher level tasks. Complains of new facial numbness on left but no other symptoms or deficits. Not able to elicit any vertigo or dizziness during this session. Noted decreased cervical AROM so instructed pt in some stretches to perform at home. Pt has good support system. Does not require skilled therapy services in hospital. Recommend continuing OPPT. All education completed. Discharge from therapy.     Follow Up Recommendations Outpatient PT(continue OPPT at neuro outpatient)    Equipment Recommendations  None recommended by PT    Recommendations for Other Services       Precautions / Restrictions Precautions Precautions: None Restrictions Weight Bearing Restrictions: No      Mobility  Bed Mobility               General bed mobility comments: Sitting EOB upon PT arrival.   Transfers Overall transfer level: Independent Equipment used: None Transfers: Sit to/from Stand Sit to Stand: Independent         General transfer comment: No assist needed, stood without difficulty.   Ambulation/Gait Ambulation/Gait assistance: Independent Ambulation Distance (Feet): 500 Feet Assistive device: None Gait Pattern/deviations: WFL(Within Functional Limits)   Gait velocity interpretation:  at or above normal speed for age/gender General Gait Details: no LOB during higher level balance challenges. See balance section for details.   Stairs Stairs: Yes Stairs assistance: Modified independent (Device/Increase time) Stair Management: One rail Right Number of Stairs: 5 General stair comments: Held rail to ascend steps but no rail to descend steps. Limited due to lines.   Wheelchair Mobility    Modified Rankin (Stroke Patients Only) Modified Rankin (Stroke Patients Only) Pre-Morbid Rankin Score: No significant disability Modified Rankin: No significant disability     Balance Overall balance assessment: Needs assistance Sitting-balance support: Feet supported;No upper extremity supported Sitting balance-Leahy Scale: Normal     Standing balance support: During functional activity Standing balance-Leahy Scale: Good   Single Leg Stance - Right Leg: 30 Single Leg Stance - Left Leg: 20         High level balance activites: Backward walking;Turns;Sudden stops;Direction changes;Head turns High Level Balance Comments: Tolerated above without deviations in gait; able to perform tandem walk for ~20' with only mild imbalance but able to recover.  Standardized Balance Assessment Standardized Balance Assessment : Dynamic Gait Index   Dynamic Gait Index Level Surface: Normal Change in Gait Speed: Normal Gait with Horizontal Head Turns: Normal Gait with Vertical Head Turns: Normal Gait and Pivot Turn: Normal Step Over Obstacle: Normal Step Around Obstacles: Normal Steps: Mild Impairment Total Score: 23       Pertinent Vitals/Pain Pain Assessment: No/denies pain    Home Living Family/patient expects to be discharged to:: Private residence Living Arrangements: Spouse/significant other;Children Available Help at Discharge: Family;Available 24 hours/day Type of Home: House Home Access: Stairs to enter Entrance Stairs-Rails: None Entrance Stairs-Number of Steps:  3 Home Layout:  Two level;Able to live on main level with bedroom/bathroom Home Equipment: Shower seat - built in Additional Comments: Lives with wife and three children    Prior Function Level of Independence: Independent         Comments: Has been doing OPPT at neuro outpatient and was about to finish up today. Works in [employer] and just started driving again short distances since stroke in Dec.  Gilberto Betterix Hallpike negative bilaterally.      Hand Dominance   Dominant Hand: Right    Extremity/Trunk Assessment   Upper Extremity Assessment Upper Extremity Assessment: Defer to OT evaluation;Overall WFL for tasks assessed    Lower Extremity Assessment Lower Extremity Assessment: Overall WFL for tasks assessed    Cervical / Trunk Assessment Cervical / Trunk Assessment: Normal(Decreased cervical AROM esp with extension and left rotation. )  Communication   Communication: No difficulties  Cognition Arousal/Alertness: Awake/alert Behavior During Therapy: WFL for tasks assessed/performed Overall Cognitive Status: Within Functional Limits for tasks assessed                                        General Comments General comments (skin integrity, edema, etc.): VBI testing negative in both directions. No concordant symptoms noted. Pt reports neck stiffness/discomfort. Noted decreased cervical AROM- instructed pt in neck stretches. No concordant symptoms elicited during cervical AROM testing.    Exercises     Assessment/Plan    PT Assessment Patent does not need any further PT services  PT Problem List Decreased balance       PT Treatment Interventions      PT Goals (Current goals can be found in the Care Plan section)  Acute Rehab PT Goals Patient Stated Goal: to get back to PLOF and finish PT PT Goal Formulation: All assessment and education complete, DC therapy    Frequency     Barriers to discharge        Co-evaluation               AM-PAC  PT "6 Clicks" Daily Activity  Outcome Measure Difficulty turning over in bed (including adjusting bedclothes, sheets and blankets)?: None Difficulty moving from lying on back to sitting on the side of the bed? : None Difficulty sitting down on and standing up from a chair with arms (e.g., wheelchair, bedside commode, etc,.)?: None Help needed moving to and from a bed to chair (including a wheelchair)?: None Help needed walking in hospital room?: None Help needed climbing 3-5 steps with a railing? : A Little 6 Click Score: 23    End of Session   Activity Tolerance: Patient tolerated treatment well Patient left: in bed;with call bell/phone within reach;with family/visitor present Nurse Communication: Mobility status PT Visit Diagnosis: Unsteadiness on feet (R26.81)    Time: [id number] PT Time Calculation (min) (ACUTE ONLY): 21 min   Charges:   PT Evaluation $PT Eval Low Complexity: 1 Low     PT G CodesMylo Red:        Cipriano Millikan, PT, DPT [phone]    Blake DivineShauna A Dolphus Linch 05/30/2017, 9:22 AM

## 2017-05-30 NOTE — Discharge Instructions (Signed)
1)You did not have a new stroke this time around 2) take Crestor as previously prescribed 3) take aspirin 81 mg daily along with Plavix 75 mg daily for another 2 months, then  you may stop the Plavix after a total of 3 months of combined aspirin/Plavix Therapy from the original date and continue just the aspirin 81 mg daily indefinitely after that 4) keep your outpatient neurology appointment as advised

## 2017-06-04 ENCOUNTER — Ambulatory Visit: Payer: BLUE CROSS/BLUE SHIELD | Attending: Physical Medicine and Rehabilitation | Admitting: Physical Therapy

## 2017-06-04 ENCOUNTER — Encounter: Payer: Self-pay | Admitting: Physical Therapy

## 2017-06-04 ENCOUNTER — Telehealth: Payer: Self-pay | Admitting: Physical Therapy

## 2017-06-04 DIAGNOSIS — R278 Other lack of coordination: Secondary | ICD-10-CM | POA: Diagnosis not present

## 2017-06-04 DIAGNOSIS — R2689 Other abnormalities of gait and mobility: Secondary | ICD-10-CM | POA: Diagnosis not present

## 2017-06-04 DIAGNOSIS — R42 Dizziness and giddiness: Secondary | ICD-10-CM | POA: Insufficient documentation

## 2017-06-04 DIAGNOSIS — R29818 Other symptoms and signs involving the nervous system: Secondary | ICD-10-CM | POA: Diagnosis not present

## 2017-06-04 NOTE — Telephone Encounter (Signed)
It is okay from my perspective.  Thanks.

## 2017-06-04 NOTE — Telephone Encounter (Signed)
Dr. Pearlean BrownieSethi and Dr. Allena KatzPatel- I feel Mr. Glenetta HewMcIntosh would benefit from dry needling to Rt side suboccipitals, upper trap, levator, SCM and temporalis muscles.  Is it okay to proceed with dry needling given his history?    Thanks- Clarita CraneStephanie F Jett Fukuda, PT, DPT 06/04/17 11:59 AM

## 2017-06-04 NOTE — Patient Instructions (Signed)
Sit to Side-Lying    Sit on edge of bed. 1. Turn head 45 to left. 2. Maintain head position and lie down slowly on right side. Hold until symptoms subside plus 30 seconds. 3. Sit up slowly. Hold until symptoms subside plus 30 seconds. 4. Turn head 45 to right. 5. Maintain head position and lie down slowly on left side. Hold until symptoms subside plus 30 seconds. 6. Sit up slowly.  Hold until symptoms subside plus 30 seconds.    Repeat sequence __2-3__ times per session. Do _2-3___ sessions per day.  You may stop this exercise when you go 2 full days symptoms free.  Rolling    With pillow under head, start on back. Roll slowly to right. Hold position until symptoms subside plus 20-30 seconds. Roll slowly onto left side. Hold position until symptoms subside plus 20-30 seconds. Repeat sequence __2-3__ times per session. Do _2-3___ sessions per day.    Move head and eyes together side to side for 30-60 seconds.  Stop if symptoms increase to 4/10.  Do 1-2 reps; 2-3 times a day.

## 2017-06-04 NOTE — Telephone Encounter (Signed)
As long as he does not have to stop his antiplatelet medications it may be okay to do dry needling

## 2017-06-04 NOTE — Therapy (Signed)
[facility] [address] [place], [place], [address] Phone: [phone]   Fax:  [phone]  Physical Therapy Treatment/Recertification  Patient Details  Name: Jake Moore MRN: [id number] Date of Birth: [birth year] Referring Provider: Delle Reining PA; Maryla Morrow MD   Encounter Date: 06/04/2017  PT End of Session - 06/04/17 1151    Visit Number  9    Number of Visits  12    Date for PT Re-Evaluation  07/02/17    Authorization Type  pt reports has HSA/high deductible plan;  no visit limits (per pt); later found to have 30 visit limit)    Authorization - Visit Number  9    Authorization - Number of Visits  30    PT Start Time  [phone] pt arrived late    PT Stop Time  0924    PT Time Calculation (min)  34 min    Activity Tolerance  Patient tolerated treatment well    Behavior During Therapy  [facility] for tasks assessed/performed       Past Medical History:  Diagnosis Date  . Anxiety   . CVA (cerebral vascular accident) [facility])     Past Surgical History:  Procedure Laterality Date  . TEE WITHOUT CARDIOVERSION N/A 04/14/2017   Procedure: TRANSESOPHAGEAL ECHOCARDIOGRAM (TEE);  Surgeon: Dolores Patty, MD;  Location: [facility] ENDOSCOPY;  Service: Cardiovascular;  Laterality: N/A;    There were no vitals filed for this visit.   Subjective Assessment - 06/04/17 0852    Subjective  went to ED last week; no CVA.  still having dizziness and neck tightness    Patient Stated Goals  Want to be able to drive and not feel as dizzy; able exercise again (walking, running, elliptical, weights)    Currently in Pain?  No/denies         [facility] PT Assessment - 06/04/17 1147      Posture/Postural Control   Posture/Postural Control  Postural limitations    Postural Limitations  Rounded Shoulders;Forward head      Palpation   Palpation comment  significant tightness and active trigger points noted in Rt SCM, suboccipital muscles,  cervical paraspinals and upper trap and levator scapula         Vestibular Assessment - 06/04/17 0853      Vestibular Assessment   General Observation  dizziness with seated and side to side head motion; and constant head positioning to Rt      Symptom Behavior   Type of Dizziness  Spinning tightness in back of head; and getting pulled backwards    Duration of Dizziness  1 min    Aggravating Factors  Turning head quickly;Turning head sideways;Rolling to left;Rolling to right    Relieving Factors  Rest;Lying supine      Occulomotor Exam   Occulomotor Alignment  Normal    Spontaneous  Absent    Gaze-induced  Absent    Smooth Pursuits  Intact    Saccades  Intact    Comment  HIT + to Rt      Vestibulo-Occular Reflex   VOR 1 Head Only (x 1 viewing)  WNL      Positional Testing   Sidelying Test  Sidelying Right;Sidelying Left    Horizontal Canal Testing  Horizontal Canal Right;Horizontal Canal Left      Sidelying Right   Sidelying Right Duration  3-4 seconds    Sidelying Right Symptoms  No nystagmus      Sidelying Left  Sidelying Left Duration  none    Sidelying Left Symptoms  No nystagmus      Horizontal Canal Right   Horizontal Canal Right Duration  none; mild symptoms with returning to midline    Horizontal Canal Right Symptoms  Normal      Horizontal Canal Left   Horizontal Canal Left Duration  none    Horizontal Canal Left Symptoms  Normal         Objective measurements completed on examination: See above findings.      [facility] Adult PT Treatment/Exercise - 06/04/17 1147      Self-Care   Self-Care  Other Self-Care Comments    Other Self-Care Comments   instructed in use of double tennis balls in sock to help with STM at home; educated on habituation exercises at home      Manual Therapy   Manual Therapy  Soft tissue mobilization    Soft tissue mobilization  suboccipital releas, SCM and upper trap and levator             PT Education - 06/04/17  1151    Education provided  Yes    Education Details  HEP; use of tennis ball for [employer]) Educated  Patient    Methods  Explanation;Demonstration;Handout    Comprehension  Verbalized understanding          PT Long Term Goals - 06/04/17 1152      PT LONG TERM GOAL #1   Title  Patient will be independent with HEP and able to verbalize plans for return to community-based exercise program. (Target all LTGs 05/25/17)    Status  On-going    Target Date  07/02/17      PT LONG TERM GOAL #2   Title  Patient will demonstrate lesser fall risk with score of >22 on FGA.    Status  Achieved      PT LONG TERM GOAL #3   Title  Patient will ambulate independently >1000 ft over level, unlevel outdoor surfaces with <6 inch drift off straight path.     Status  On-going    Target Date  07/02/17      PT LONG TERM GOAL #4   Title  Patient will score >=52 on Berg to indicate lesser fall risk.    Status  On-going    Target Date  07/02/17      PT LONG TERM GOAL #5   Title  pt will demonstrate decreased head/neck tightness and decreased symptoms for improved function    Status  New    Target Date  07/02/17             Plan - 06/04/17 1153    Clinical Impression Statement  Pt presents today following recent hospitalization for stroke like symptoms, but imaging negative for CVA.  Pt demonstrates mild dizziness which seems to be most consistent with motion sensitivity and initiated habituation exercises today.  Significant tightness noted along Rt head/neck muscles and feel he will benefit from TDN, but would like MD clearance before performing.  Will plan to see 1x/wk x 4 weeks to address deficits.    Rehab Potential  Excellent    PT Frequency  1x / week    PT Duration  4 weeks    PT Treatment/Interventions  ADLs/Self Care Home Management;Gait training;Neuromuscular re-education;Balance training;Therapeutic exercise;Therapeutic activities;Functional mobility training;Stair  training;Patient/family education;Vestibular;Visual/perceptual remediation/compensation    PT Next Visit Plan  check habituation, DN if MD agrees  Consulted and Agree with Plan of Care  Patient       Patient will benefit from skilled therapeutic intervention in order to improve the following deficits and impairments:  Abnormal gait, Decreased balance, Decreased mobility, Decreased coordination, Dizziness, Impaired sensation, Impaired vision/preception, Impaired UE functional use  Visit Diagnosis: Dizziness and giddiness - Plan: PT plan of care cert/re-cert  Other symptoms and signs involving the nervous system - Plan: PT plan of care cert/re-cert  Other abnormalities of gait and mobility - Plan: PT plan of care cert/re-cert  Other lack of coordination - Plan: PT plan of care cert/re-cert     Problem List Patient Active Problem List   Diagnosis Date Noted  . Dizziness 05/29/2017  . AKI (acute kidney injury) (HCC)   . Blood glucose elevated   . Generalized anxiety disorder   . Transaminitis   . CVA (cerebral vascular accident) (HCC) 04/17/2017  . 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 Altered sensation due to recent stroke   . Anxiety state   . Attention deficit disorder   . Hypokalemia   . Stroke, Wallenberg's syndrome   . Stroke (HCC) 04/11/2017  . Hyperlipidemia 04/11/2017  . 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 Anxiety        Clarita CraneStephanie F Viha Kriegel, PT, DPT 06/04/17 11:57 AM    [facility] [address] [place], [place], [address] Phone: [phone]   Fax:  [phone]  Name: Jake BameRobert F Moore MRN: [id number] Date of Birth: 07/05/1975

## 2017-06-09 ENCOUNTER — Ambulatory Visit: Payer: BLUE CROSS/BLUE SHIELD | Admitting: Physical Therapy

## 2017-06-09 ENCOUNTER — Encounter: Payer: Self-pay | Admitting: Physical Therapy

## 2017-06-09 DIAGNOSIS — R2689 Other abnormalities of gait and mobility: Secondary | ICD-10-CM

## 2017-06-09 DIAGNOSIS — R42 Dizziness and giddiness: Secondary | ICD-10-CM

## 2017-06-09 DIAGNOSIS — R278 Other lack of coordination: Secondary | ICD-10-CM | POA: Diagnosis not present

## 2017-06-09 DIAGNOSIS — R29818 Other symptoms and signs involving the nervous system: Secondary | ICD-10-CM

## 2017-06-09 NOTE — Therapy (Signed)
[facility] [address] [place], [place], [address] Phone: [phone]   Fax:  [phone]  Physical Therapy Treatment  Patient Details  Name: Jake BameRobert F Moore MRN: [id number] Date of Birth: 09/09/1975 Referring Provider: Delle ReiningLove, Pamela PA; Maryla MorrowPatel, Ankit MD   Encounter Date: 06/09/2017  PT End of Session - 06/09/17 1009    Visit Number  10    Number of Visits  12    Date for PT Re-Evaluation  07/02/17    Authorization Type  pt reports has HSA/high deductible plan;  no visit limits (per pt); later found to have 30 visit limit)    Authorization - Visit Number  10    Authorization - Number of Visits  30    PT Start Time  [id number]    PT Stop Time  1002    PT Time Calculation (min)  31 min    Activity Tolerance  Patient tolerated treatment well    Behavior During Therapy  [facility] for tasks assessed/performed       Past Medical History:  Diagnosis Date  . Anxiety   . CVA (cerebral vascular accident) [facility](HCC)     Past Surgical History:  Procedure Laterality Date  . TEE WITHOUT CARDIOVERSION N/A 04/14/2017   Procedure: TRANSESOPHAGEAL ECHOCARDIOGRAM (TEE);  Surgeon: Dolores PattyBensimhon, Daniel R, MD;  [facility];  Service: Cardiovascular;  Laterality: N/A;    There were no vitals filed for this visit.  Subjective Assessment - 06/09/17 0933    Subjective  doing well; just having some tightness in neck    Patient Stated Goals  Want to be able to drive and not feel as dizzy; able exercise again (walking, running, elliptical, weights)    Currently in Pain?  No/denies                      [facility] Treatment/Exercise - 06/09/17 1006      Exercises   Exercises  Neck      Manual Therapy   Manual Therapy  Soft tissue mobilization    Soft tissue mobilization  suboccipital releas, SCM and upper trap and levator      Neck Exercises: Stretches   Upper Trapezius Stretch  Right;1 rep;30 seconds    Levator  Stretch  Right;1 rep;30 seconds    Other Neck Stretches  SCM stretch 1 rep x 30 sec; cues with all exercises to modify due to vertebral artery dissection       Trigger Point Dry Needling - 06/09/17 1001    Consent Given?  Yes    Education Handout Provided  Yes    Muscles Treated Upper Body  Sternocleidomastoid;Suboccipitals muscle group;Longissimus and temporalis    Sternocleidomastoid Response  Twitch response elicited;Palpable increased muscle length    SubOccipitals Response  Twitch response elicited;Palpable increased muscle length    Longissimus Response  Twitch response elicited;Palpable increased muscle length cervical paraspinals           PT Education - 06/09/17 1008    Education provided  Yes    Education Details  DN    Person(s) Educated  Patient    Methods  Explanation;Handout    Comprehension  Verbalized understanding          PT Long Term Goals - 06/04/17 1152      PT LONG TERM GOAL #1   Title  Patient will be independent with HEP and able to verbalize plans for return to community-based exercise program. (Target all LTGs  05/25/17)    Status  On-going    Target Date  07/02/17      PT LONG TERM GOAL #2   Title  Patient will demonstrate lesser fall risk with score of >22 on FGA.    Status  Achieved      PT LONG TERM GOAL #3   Title  Patient will ambulate independently >1000 ft over level, unlevel outdoor surfaces with <6 inch drift off straight path.     Status  On-going    Target Date  07/02/17      PT LONG TERM GOAL #4   Title  Patient will score >=52 on Berg to indicate lesser fall risk.    Status  On-going    Target Date  07/02/17      PT LONG TERM GOAL #5   Title  pt will demonstrate decreased head/neck tightness and decreased symptoms for improved function    Status  New    Target Date  07/02/17            Plan - 06/09/17 1009    Clinical Impression Statement  Session today focued on DN and manual therapy of Rt head/neck muscles with  twitch responses and decreased tightness noted following session.  Pt will benefit from continued PT to decrease tightness and maximize function.    PT Treatment/Interventions  ADLs/Self Care Home Management;Gait training;Neuromuscular re-education;Balance training;Therapeutic exercise;Therapeutic activities;Functional mobility training;Stair training;Patient/family education;Vestibular;Dry needling;Visual/perceptual remediation/compensation    PT Next Visit Plan  assess response to DN; habituation PRN, continue manual and DN    Consulted and Agree with Plan of Care  Patient       Patient will benefit from skilled therapeutic intervention in order to improve the following deficits and impairments:  Abnormal gait, Decreased balance, Decreased mobility, Decreased coordination, Dizziness, Impaired sensation, Impaired vision/preception, Impaired UE functional use  Visit Diagnosis: Dizziness and giddiness  Other symptoms and signs involving the nervous system  Other abnormalities of gait and mobility     Problem List Patient Active Problem List   Diagnosis Date Noted  . Dizziness 05/29/2017  . AKI (acute kidney injury) (HCC)   . Blood glucose elevated   . Generalized anxiety disorder   . Transaminitis   . CVA (cerebral vascular accident) (HCC) 04/17/2017  . 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 Altered sensation due to recent stroke   . Anxiety state   . Attention deficit disorder   . Hypokalemia   . Stroke, Wallenberg's syndrome   . Stroke (HCC) 04/11/2017  . Hyperlipidemia 04/11/2017  . 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 Anxiety       Clarita Crane, PT, DPT 06/09/17 10:12 AM    [facility] [address] [place], [place], [address] Phone: [phone]   Fax:  [phone]  Name: Jake Moore MRN: [id number] Date of Birth: Aug 29, 1975

## 2017-06-09 NOTE — Patient Instructions (Signed)

## 2017-06-12 DIAGNOSIS — I639 Cerebral infarction, unspecified: Secondary | ICD-10-CM | POA: Diagnosis not present

## 2017-06-12 DIAGNOSIS — F4322 Adjustment disorder with anxiety: Secondary | ICD-10-CM | POA: Diagnosis not present

## 2017-06-13 DIAGNOSIS — I639 Cerebral infarction, unspecified: Secondary | ICD-10-CM | POA: Diagnosis not present

## 2017-06-16 ENCOUNTER — Encounter: Payer: Self-pay | Admitting: Physical Therapy

## 2017-06-16 ENCOUNTER — Ambulatory Visit: Payer: BLUE CROSS/BLUE SHIELD | Admitting: Physical Therapy

## 2017-06-16 DIAGNOSIS — R29818 Other symptoms and signs involving the nervous system: Secondary | ICD-10-CM

## 2017-06-16 DIAGNOSIS — R42 Dizziness and giddiness: Secondary | ICD-10-CM | POA: Diagnosis not present

## 2017-06-16 DIAGNOSIS — R2689 Other abnormalities of gait and mobility: Secondary | ICD-10-CM

## 2017-06-16 DIAGNOSIS — R278 Other lack of coordination: Secondary | ICD-10-CM | POA: Diagnosis not present

## 2017-06-16 NOTE — Therapy (Signed)
[facility] [address] [place], [place], [address] Phone: [phone]   Fax:  [phone]  Physical Therapy Treatment  Patient Details  Name: Jake Moore MRN: [id number] Date of Birth: 1976/01/05 Referring Provider: Delle Reining PA; Maryla Morrow MD   Encounter Date: 06/16/2017  PT End of Session - 06/16/17 1354    Visit Number  11    Number of Visits  12    Date for PT Re-Evaluation  07/02/17    Authorization Type  pt reports has HSA/high deductible plan;  no visit limits (per pt); later found to have 30 visit limit)    Authorization - Visit Number  11    Authorization - Number of Visits  30    PT Start Time  1315    PT Stop Time  1354    PT Time Calculation (min)  39 min    Activity Tolerance  Patient tolerated treatment well    Behavior During Therapy  [facility] for tasks assessed/performed       Past Medical History:  Diagnosis Date  . Anxiety   . CVA (cerebral vascular accident) [facility])     Past Surgical History:  Procedure Laterality Date  . TEE WITHOUT CARDIOVERSION N/A 04/14/2017   Procedure: TRANSESOPHAGEAL ECHOCARDIOGRAM (TEE);  Surgeon: Dolores Patty, MD;  Location: [facility] ENDOSCOPY;  Service: Cardiovascular;  Laterality: N/A;    There were no vitals filed for this visit.  Subjective Assessment - 06/16/17 1317    Subjective  felt much better after DN session; then over weekend had return of symptoms "like someone is pushing down on my head"    Patient Stated Goals  Want to be able to drive and not feel as dizzy; able exercise again (walking, running, elliptical, weights)    Currently in Pain?  Yes    Pain Score  6     Pain Location  Head    Pain Orientation  Posterior top of head to base of skull    Pain Descriptors / Indicators  Pressure;Headache;Tightness    Pain Type  Chronic pain    Pain Onset  More than a month ago    Pain Frequency  Intermittent    Aggravating Factors   unknown    Pain Relieving Factors  ibuprofen                      [facility] Adult PT Treatment/Exercise - 06/16/17 1353      Manual Therapy   Manual Therapy  Soft tissue mobilization    Manual therapy comments  skilled palpation of soft tissue during DN    Soft tissue mobilization  suboccipital releas, SCM and upper trap and levator       Trigger Point Dry Needling - 06/16/17 1321    Consent Given?  Yes    Education Handout Provided  Yes    Muscles Treated Upper Body  Upper trapezius;Levator scapulae;Suboccipitals muscle group temporalis    Upper Trapezius Response  Twitch reponse elicited;Palpable increased muscle length    SubOccipitals Response  Twitch response elicited;Palpable increased muscle length    Levator Scapulae Response  Twitch response elicited;Palpable increased muscle length           PT Education - 06/16/17 1353    Education provided  Yes    Education Details  DN, lung field    Person(s) Educated  Patient    Methods  Explanation    Comprehension  Verbalized understanding  PT Long Term Goals - 06/04/17 1152      PT LONG TERM GOAL #1   Title  Patient will be independent with HEP and able to verbalize plans for return to community-based exercise program. (Target all LTGs 05/25/17)    Status  On-going    Target Date  07/02/17      PT LONG TERM GOAL #2   Title  Patient will demonstrate lesser fall risk with score of >22 on FGA.    Status  Achieved      PT LONG TERM GOAL #3   Title  Patient will ambulate independently >1000 ft over level, unlevel outdoor surfaces with <6 inch drift off straight path.     Status  On-going    Target Date  07/02/17      PT LONG TERM GOAL #4   Title  Patient will score >=52 on Berg to indicate lesser fall risk.    Status  On-going    Target Date  07/02/17      PT LONG TERM GOAL #5   Title  pt will demonstrate decreased head/neck tightness and decreased symptoms for improved function    Status  New    Target  Date  07/02/17            Plan - 06/16/17 1354    Clinical Impression Statement  Pt reported decreased headache after DN today with improvement in symptoms.  Discussed causes of return of neck tightness and pt verbalized understanding.    PT Treatment/Interventions  ADLs/Self Care Home Management;Gait training;Neuromuscular re-education;Balance training;Therapeutic exercise;Therapeutic activities;Functional mobility training;Stair training;Patient/family education;Vestibular;Dry needling;Visual/perceptual remediation/compensation    PT Next Visit Plan  continue manual and DN    Consulted and Agree with Plan of Care  Patient       Patient will benefit from skilled therapeutic intervention in order to improve the following deficits and impairments:  Abnormal gait, Decreased balance, Decreased mobility, Decreased coordination, Dizziness, Impaired sensation, Impaired vision/preception, Impaired UE functional use  Visit Diagnosis: Dizziness and giddiness  Other symptoms and signs involving the nervous system  Other abnormalities of gait and mobility     Problem List Patient Active Problem List   Diagnosis Date Noted  . Dizziness 05/29/2017  . AKI (acute kidney injury) (HCC)   . Blood glucose elevated   . Generalized anxiety disorder   . Transaminitis   . CVA (cerebral vascular accident) (HCC) 04/17/2017  . 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 Altered sensation due to recent stroke   . Anxiety state   . Attention deficit disorder   . Hypokalemia   . Stroke, Wallenberg's syndrome   . Stroke (HCC) 04/11/2017  . Hyperlipidemia 04/11/2017  . 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 Anxiety       Clarita CraneStephanie F Bryce Kimble, PT, DPT 06/16/17 1:55 PM    [facility] [address] [place], [place], [address] Phone: [phone]   Fax:  [phone]  Name: Jake Moore MRN: [id number] Date of Birth:  08/02/1975

## 2017-06-18 ENCOUNTER — Ambulatory Visit (INDEPENDENT_AMBULATORY_CARE_PROVIDER_SITE_OTHER): Payer: BLUE CROSS/BLUE SHIELD | Admitting: Neurology

## 2017-06-18 ENCOUNTER — Encounter: Payer: Self-pay | Admitting: Neurology

## 2017-06-18 VITALS — BP 120/76 | HR 81 | Ht 70.0 in | Wt 179.6 lb

## 2017-06-18 DIAGNOSIS — I63011 Cerebral infarction due to thrombosis of right vertebral artery: Secondary | ICD-10-CM

## 2017-06-18 NOTE — Patient Instructions (Addendum)
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 I also advised the patient to eat a healthy diet with plenty of whole grains, cereals, fruits and vegetables, exercise regularly and maintain ideal body weight .I recommend he do regular neck stretching exercises and participate in stress relaxation activities like meditation and yoga. He was advised to increase activity slowly but to avoid certain neck jerking movements. He will return for follow-up in 6 months or call earlier if necessary   Neck Exercises Neck exercises can be important for many reasons:  They can help you to improve and maintain flexibility in your neck. This can be especially important as you age.  They can help to make your neck stronger. This can make movement easier.  They can reduce or prevent neck pain.  They may help your upper back.  Ask your health care provider which neck exercises would be best for you. Exercises Neck Press Repeat this exercise 10 times. Do it first thing in the morning and right before bed or as told by your health care provider. 1. Lie on your back on a firm bed or on the floor with a pillow under your head. 2. Use your neck muscles to push your head down on the pillow and straighten your spine. 3. Hold the position as well as you can. Keep your head facing up and your chin tucked. 4. Slowly count to 5 while holding this position. 5. Relax for a few seconds. Then  repeat.  Isometric Strengthening Do a full set of these exercises 2 times a day or as told by your health care provider. 1. Sit in a supportive chair and place your hand on your forehead. 2. Push forward with your head and neck while pushing back with your hand. Hold for 10 seconds. 3. Relax. Then repeat the exercise 3 times. 4. Next, do thesequence again, this time putting your hand against the back of your head. Use your head and neck to push backward against the hand pressure. 5. Finally, do the same exercise on either side of your head, pushing sideways against the pressure of your hand.  Prone Head Lifts Repeat this exercise 5 times. Do this 2 times a day or as told by your health care provider. 1. Lie face-down, resting on your elbows so that your chest and upper back are raised. 2. Start with your head facing downward, near your chest. Position your chin either on or near your chest. 3. Slowly lift your head upward. Lift until you are looking straight ahead. Then continue lifting your head as far back as you can stretch. 4. Hold your head up for 5 seconds. Then slowly lower it to your starting position.  Supine Head Lifts Repeat this exercise 8-10 times. Do this 2 times a day or as told by your health care provider. 1. Lie on your back, bending your knees to point to the ceiling and keeping your feet flat on the floor. 2. Lift your head slowly off the floor, raising your chin toward  your chest. 3. Hold for 5 seconds. 4. Relax and repeat.  Scapular Retraction Repeat this exercise 5 times. Do this 2 times a day or as told by your health care provider. 1. Stand with your arms at your sides. Look straight ahead. 2. Slowly pull both shoulders backward and downward until you feel a stretch between your shoulder blades in your upper back. 3. Hold for 10-30 seconds. 4. Relax and repeat.  Contact a health care provider if:  Your neck pain or discomfort gets much worse when you do an  exercise.  Your neck pain or discomfort does not improve within 2 hours after you exercise. If you have any of these problems, stop exercising right away. Do not do the exercises again unless your health care provider says that you can. Get help right away if:  You develop sudden, severe neck pain. If this happens, stop exercising right away. Do not do the exercises again unless your health care provider says that you can. Exercises Neck Stretch  Repeat this exercise 3-5 times. 1. Do this exercise while standing or while sitting in a chair. 2. Place your feet flat on the floor, shoulder-width apart. 3. Slowly turn your head to the right. Turn it all the way to the right so you can look over your right shoulder. Do not tilt or tip your head. 4. Hold this position for 10-30 seconds. 5. Slowly turn your head to the left, to look over your left shoulder. 6. Hold this position for 10-30 seconds.  Neck Retraction Repeat this exercise 8-10 times. Do this 3-4 times a day or as told by your health care provider. 1. Do this exercise while standing or while sitting in a sturdy chair. 2. Look straight ahead. Do not bend your neck. 3. Use your fingers to push your chin backward. Do not bend your neck for this movement. Continue to face straight ahead. If you are doing the exercise properly, you will feel a slight sensation in your throat and a stretch at the back of your neck. 4. Hold the stretch for 1-2 seconds. Relax and repeat.  This information is not intended to replace advice given to you by your health care provider. Make sure you discuss any questions you have with your health care provider. Document Released: 03/20/2015 Document Revised: 09/14/2015 Document Reviewed: 10/17/2014 Elsevier Interactive Patient Education  2018 [employer]   Stroke Prevention Some medical conditions and behaviors are associated with a higher chance of having a stroke. You can help prevent a stroke by making  nutrition, lifestyle, and other changes, including managing any medical conditions you may have. What nutrition changes can be made?  Eat healthy foods. You can do this by: ? Choosing foods high in fiber, such as fresh fruits and vegetables and whole grains. ? Eating at least 5 or more servings of fruits and vegetables a day. Try to fill half of your plate at each meal with fruits and vegetables. ? Choosing lean protein foods, such as lean cuts of meat, poultry without skin, fish, tofu, beans, and nuts. ? Eating low-fat dairy products. ? Avoiding foods that are high in salt (sodium). This can help lower blood pressure. ? Avoiding foods that have saturated fat, trans fat, and cholesterol. This can help prevent high cholesterol. ? Avoiding processed and premade foods.  Follow your health care provider's specific guidelines for losing weight, controlling high blood pressure (hypertension), lowering high cholesterol, and managing diabetes. These may include: ? Reducing your daily calorie  intake. ? Limiting your daily sodium intake to 1,500 milligrams (mg). ? Using only healthy fats for cooking, such as olive oil, canola oil, or sunflower oil. ? Counting your daily carbohydrate intake. What lifestyle changes can be made?  Maintain a healthy weight. Talk to your health care provider about your ideal weight.  Get at least 30 minutes of moderate physical activity at least 5 days a week. Moderate activity includes brisk walking, biking, and swimming.  Do not use any products that contain nicotine or tobacco, such as cigarettes and e-cigarettes. If you need help quitting, ask your health care provider. It may also be helpful to avoid exposure to secondhand smoke.  Limit alcohol intake to no more than 1 drink a day for nonpregnant women and 2 drinks a day for men. One drink equals 12 oz of beer, 5 oz of wine, or 1 oz of hard liquor.  Stop any illegal drug use.  Avoid taking birth control pills.  Talk to your health care provider about the risks of taking birth control pills if: ? You are over 52 years old. ? You smoke. ? You get migraines. ? You have ever had a blood clot. What other changes can be made?  Manage your cholesterol levels. ? Eating a healthy diet is important for preventing high cholesterol. If cholesterol cannot be managed through diet alone, you may also need to take medicines. ? Take any prescribed medicines to control your cholesterol as told by your health care provider.  Manage your diabetes. ? Eating a healthy diet and exercising regularly are important parts of managing your blood sugar. If your blood sugar cannot be managed through diet and exercise, you may need to take medicines. ? Take any prescribed medicines to control your diabetes as told by your health care provider.  Control your hypertension. ? To reduce your risk of stroke, try to keep your blood pressure below 130/80. ? Eating a healthy diet and exercising regularly are an important part of controlling your blood pressure. If your blood pressure cannot be managed through diet and exercise, you may need to take medicines. ? Take any prescribed medicines to control hypertension as told by your health care provider. ? Ask your health care provider if you should monitor your blood pressure at home. ? Have your blood pressure checked every year, even if your blood pressure is normal. Blood pressure increases with age and some medical conditions.  Get evaluated for sleep disorders (sleep apnea). Talk to your health care provider about getting a sleep evaluation if you snore a lot or have excessive sleepiness.  Take over-the-counter and prescription medicines only as told by your health care provider. Aspirin or blood thinners (antiplatelets or anticoagulants) may be recommended to reduce your risk of forming blood clots that can lead to stroke.  Make sure that any other medical conditions you have, such  as atrial fibrillation or atherosclerosis, are managed. What are the warning signs of a stroke? The warning signs of a stroke can be easily remembered as BEFAST.  B is for balance. Signs include: ? Dizziness. ? Loss of balance or coordination. ? Sudden trouble walking.  E is for eyes. Signs include: ? A sudden change in vision. ? Trouble seeing.  F is for face. Signs include: ? Sudden weakness or numbness of the face. ? The face or eyelid drooping to one side.  A is for arms. Signs include: ? Sudden weakness or numbness of the arm, usually on one side of  the body.  S is for speech. Signs include: ? Trouble speaking (aphasia). ? Trouble understanding.  T is for time. ? These symptoms may represent a serious problem that is an emergency. Do not wait to see if the symptoms will go away. Get medical help right away. Call your local emergency services (911 in the [place] Do not drive yourself to the hospital.  Other signs of stroke may include: ? A sudden, severe headache with no known cause. ? Nausea or vomiting. ? Seizure.  Where to find more information: For more information, visit:  American Stroke Association: www.strokeassociation.org  National Stroke Association: www.stroke.org  Summary  You can prevent a stroke by eating healthy, exercising, not smoking, limiting alcohol intake, and managing any medical conditions you may have.  Do not use any products that contain nicotine or tobacco, such as cigarettes and e-cigarettes. If you need help quitting, ask your health care provider. It may also be helpful to avoid exposure to secondhand smoke.  Remember BEFAST for warning signs of stroke. Get help right away if you or a loved one has any of these signs. This information is not intended to replace advice given to you by your health care provider. Make sure you discuss any questions you have with your health care provider. Document Released: 05/16/2004 Document Revised:  05/14/2016 Document Reviewed: 05/14/2016 Elsevier Interactive Patient Education  [employer].

## 2017-06-18 NOTE — Progress Notes (Signed)
[facility] [address] [place]. [place] [address] [phone]       OFFICE FOLLOW-UP NOTE  [name] Date of Birth:  12-07-1975 Medical Record Number:  [id number]   HPI: [name] is a present 42 year old Caucasian male who is seen today for first office follow-up visit following hospital admission for stroke in December 2018. He is accompanied and his wife. History is obtained from the patient, wife and review of electronic medical records. I have personally reviewed imaging for lemons as well as his records from [facility] and [facility] in care everywhereRobert F Majka is an 42 y.o. male with HLD family history  significant for blood clots, prior tobacco use presents to the emergency room after being discharged yesterday for worsening dizziness, gait imbalance nausea headache and progressive of right-sided facial numbness. She came with same symptoms yesterday that started at 8:30 in the morning. During that visit his CT head was done and patient was given meclizine. Reviewing the note is unclear if the patient was made to walk prior to discharge. Patient states that symptoms worsened yesterday at 11 PM and numbness over his face extended from nose to the right half of his face. He states that he lifted weights the night before but denies recent chiropractic manipulation, twisting of his neck. He does complain of neck pain on the left side. His sister and mother had multiple clots. His sister event extensive evaluation including at [facility] and [facility] known to have higher levels of factor VIII and elevated lipoprotein A levels. He takes crestor at home, not on ASA. Date last known well: 12.20.18.Time last known well: 8.15 am.tPA Given: outside window.NIHSS: 1.Baseline MRS 0  CT scan showed no acute abnormality. 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 Otherwise CTA of the neck and brain was unremarkable. MRI scan  of the brain showed right lateral medullary as well as tiny right cerebellar infarct. Transthoracic echo showed normal ejection fraction. Transesophageal echo showed normal cardia Embolism but Showed a Small Right to Left Shunt.Transcranial Doppler Bubble Study Was Also Positive for a Small Right to Left Shunt Only.lupus anticoagulant by LA-PTT and DRVVT negative. Anticardiolipin antibodies negative. Anti-beta-2 glycoprotein 1 antibodies negative. Homocystine 7.3. HDL 71, LDL 57. Factor V Leiden and prothrombin 76546 mutations not present. Antithrombin activity, protein C activity and protein C antigen normal. Total protein S antigen and protein S activity normal. Sedimentation rate 1. C-reactive protein negative.  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 His sister had a DVT and had age of 65 who was known to have elevated factor VIII levels. The patient's factor VIII level was normal at 146 on 04/21/2007 and on 04/21/2014 it was minimally elevated at 175 which was clinically not significant..  Prothrombin Gene and Factor V Leiden Mutations Were Negative. Lower extremity venous Dopplers were negative for DVT. Hemoglobin A1c was 5.2. LDL cholesterol was 57 mg percent. Patient was started on dual antiplatelet therapy of aspirin and Plavix after being started initially on heparin for a few days. The etiology of patient's stroke was indeterminate patient denied significant neck pain or any physical exertion to suggest dissection at that time. He agreed to proceed in the [facility]. Feels transferred to inpatient rehabilitation but did well and is currently at all. He is subsequently had a second neurological opinion at [facility] neurology with Dr. Blondell Reveal as well as  hematologist' Dr Isaiah Serge for family h/o elevated  factor 8 level. Patient was continued on aspirin and Plavix and not commended long-term decompression. Patient states his balance and gait have improved his has  some intermittent numbness in the left side of the face as well as some transient vertigo. He in fact was readmitted on 05/29/16 because of transient positional vertigo which resolved shortly after admission. This was felt to be peripheral positional vertigo. 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 Patient was seen by physical therapist advised him to vascular stabilization exercises as well as continue aspirin and Plavix and Crestor. Patient states his done well since discharge. No longer feeling dizzy. His been complaining of posterior neck pain and headaches and physical therapies have started dry needling is helping. He had a outpatient heart monitor done at [facility] on 05/19/17 which did not show any evidence of paroxysmal atrial fibrillation. He has in psychiatrist Dr. Evelene Croon for his anxiety and ADHD and she is prescribed Celexa but he has not started it yet. The patient has started going back to work but states that he gets tired easily. Is planning a trip to the [place] and he wants to start exercising and increasing his physical activity gradually. Is tolerating aspirin and Plavix without bruising or bleeding. Is tolerating vascular muscle aches and pains. His blood pressure well controlled. It is 120/76. His considering starting PCS 9 iinhibotor injections for his elevated lipoprotein a and family history of premature coronary artery disease and his insurance has denied this and he will try to get samples from his cardiologist        ROS:   14 system review of systems is positive for  numbness, neck pain, headache, fatigue, eye itching, light sensitivity, chest tightness, chest pain, cold and heat intolerance and all other systems negative  PMH:  Past Medical History:  Diagnosis Date  . Anxiety   . CVA (cerebral vascular accident) [facility])     Social History:  Social  History   Socioeconomic History  . Marital status: Single    Spouse name: Not on file  . Number of children: Not on file  . Years of education: Not on file  . Highest education level: Not on file  Social Needs  . Financial resource strain: Not on file  . Food insecurity - worry: Not on file  . Food insecurity - inability: Not on file  . Transportation needs - medical: Not on file  . Transportation needs - non-medical: Not on file  Occupational History  . Not on file  Tobacco Use  . Smoking status: Former Smoker    Years: 20.00    Types: Cigarettes    Last attempt to quit: 01/15/2016    Years since quitting: 1.4  . Smokeless tobacco: Former Neurosurgeon    Types: Chew  Substance and Sexual Activity  . Alcohol use: Yes    Alcohol/week: 6.0 oz    Types: 10 Cans of beer per week    Comment: social 10  beers per week  . Drug use: No  . Sexual activity: Yes  Other Topics Concern  . Not on file  Social History Narrative  . Not on file    Medications:   Current Outpatient Medications on File Prior to Visit  Medication Sig Dispense Refill  . aspirin EC 81 MG tablet Take 81 mg by mouth daily.    Marland Kitchen  atomoxetine (STRATTERA) 10 MG capsule Take 10 mg by mouth daily.    . citalopram (CELEXA) 10 MG tablet   1  . clopidogrel (PLAVIX) 75 MG tablet Take 1 tablet (75 mg total) by mouth daily. 30 tablet 6  . rosuvastatin (CRESTOR) 10 MG tablet 20 mg.   10   No current facility-administered medications on file prior to visit.     Allergies:   Allergies  Allergen Reactions  . Penicillins Hives    Has patient had a PCN reaction causing immediate rash, facial/tongue/throat swelling, SOB or lightheadedness with hypotension: YES Has patient had a PCN reaction causing severe rash involving mucus membranes or skin necrosis: NO Has patient had a PCN reaction that required hospitalizationNO Has patient had a PCN reaction occurring within the last 10 years: NO If all of the above answers are "NO",  then may proceed with Cephalosporin use.    Physical Exam General: well developed, well nourished young Caucasian male, seated, in no evident distress Head: head normocephalic and atraumatic.  Neck: supple with no carotid or supraclavicular bruits Cardiovascular: regular rate and rhythm, no murmurs Musculoskeletal: no deformity Skin:  no rash/petichiae Vascular:  Normal pulses all extremities Vitals:   06/18/17 0901  BP: 120/76  Pulse: 81   Neurologic Exam Mental Status: Awake and fully alert. Oriented to place and time. Recent and remote memory intact. Attention span, concentration and fund of knowledge appropriate. Mood and affect appropriate.  Cranial Nerves: Fundoscopic exam reveals sharp disc margins. Pupils equal, briskly reactive to light. Extraocular movements full without nystagmus. Visual fields full to confrontation. Hearing intact. Facial sensation diminished on the left side Face, tongue, palate moves normally and symmetrically.  Motor: Normal bulk and tone. Normal strength in all tested extremity muscles. Sensory.: intact to touch ,pinprick .position and vibratory sensation.  Coordination: Rapid alternating movements normal in all extremities. Finger-to-nose and heel-to-shin performed accurately bilaterally. Gait and Station: Arises from chair without difficulty. Stance is normal. Gait demonstrates normal stride length and balance . Able to heel, toe and tandem walk without difficulty.  Reflexes: 1+ and symmetric. Toes downgoing.   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 Vascular risk factors of small PFO and hyperlipidemia only. 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 I also advised the patient to eat a healthy diet with plenty of whole grains, cereals, fruits and vegetables, exercise regularly and maintain ideal body weight .I recommend he do regular neck stretching exercises and participate in stress relaxation activities like meditation and yoga. He was advised to increase activity slowly but to avoid certain neck jerking movements. He will return for follow-up in 6 months or call earlier if necessary  Greater than 50% of time  during this 35 minute visit was spent on counseling,explanation of diagnosis vertebral artery occlusion, brainstem stroke, dizziness, planning of further management, discussion with patient and family and coordination of care Delia Heady, MD  [facility] [address] [place], [place] [address]  Phone [phone] Fax [phone] Note: This document was prepared with digital dictation and possible smart phrase technology. Any transcriptional errors that result from this process are unintentional

## 2017-06-23 ENCOUNTER — Encounter: Payer: Self-pay | Admitting: Physical Therapy

## 2017-06-23 ENCOUNTER — Ambulatory Visit: Payer: BLUE CROSS/BLUE SHIELD | Attending: Physical Medicine and Rehabilitation | Admitting: Physical Therapy

## 2017-06-23 VITALS — BP 124/85

## 2017-06-23 DIAGNOSIS — R42 Dizziness and giddiness: Secondary | ICD-10-CM | POA: Insufficient documentation

## 2017-06-23 DIAGNOSIS — R2689 Other abnormalities of gait and mobility: Secondary | ICD-10-CM | POA: Diagnosis not present

## 2017-06-23 DIAGNOSIS — R29818 Other symptoms and signs involving the nervous system: Secondary | ICD-10-CM | POA: Insufficient documentation

## 2017-06-23 NOTE — Therapy (Addendum)
[place] [address] [place], [place], [address] Phone: [phone]   Fax:  [phone]  Physical Therapy Treatment/Discharge  Patient Details  Name: Jake Moore MRN: [id number] Date of Birth: 05/11/1975 Referring Provider: Reesa Chew PA; Delice Lesch MD   Encounter Date: 06/23/2017  PT End of Session - 06/23/17 1343    Visit Number  12    Number of Visits  12    Date for PT Re-Evaluation  07/02/17    Authorization Type  pt reports has HSA/high deductible plan;  no visit limits (per pt); later found to have 30 visit limit)    Authorization - Visit Number  12    Authorization - Number of Visits  30    PT Start Time  1320    PT Stop Time  1334    PT Time Calculation (min)  14 min    Activity Tolerance  Patient tolerated treatment well    Behavior During Therapy  [facility] for tasks assessed/performed       Past Medical History:  Diagnosis Date  . Anxiety   . CVA (cerebral vascular accident) [facility])     Past Surgical History:  Procedure Laterality Date  . TEE WITHOUT CARDIOVERSION N/A 04/14/2017   Procedure: TRANSESOPHAGEAL ECHOCARDIOGRAM (TEE);  Surgeon: Jolaine Artist, MD;  Location: [facility] ENDOSCOPY;  Service: Cardiovascular;  Laterality: N/A;    Vitals:   06/23/17 1325  BP: 124/85    Subjective Assessment - 06/23/17 1323    Subjective  feels a little dizzy (wife thinks it's anxiety since getting news Elsworth Soho died of CVA); had a headache earlier but seems resolved.  decreased tightness overall    Pertinent History  anxiety/ADD, hyperlipidemia     Patient Stated Goals  Want to be able to drive and not feel as dizzy; able exercise again (walking, running, elliptical, weights)    Currently in Pain?  No/denies                      [facility] PT Treatment/Exercise - 06/23/17 1332      Berg Balance Test   Sit to Stand  Able to stand without using hands and stabilize independently    Standing Unsupported  Able to stand safely 2 minutes    Sitting with Back Unsupported but Feet Supported on Floor or Stool  Able to sit safely and securely 2 minutes    Stand to Sit  Sits safely with minimal use of hands    Transfers  Able to transfer safely, minor use of hands    Standing Unsupported with Eyes Closed  Able to stand 10 seconds safely    Standing Ubsupported with Feet Together  Able to place feet together independently and stand 1 minute safely    From Standing, Reach Forward with Outstretched Arm  Can reach confidently >25 cm (10")    From Standing Position, Pick up Object from Floor  Able to pick up shoe safely and easily    From Standing Position, Turn to Look Behind Over each Shoulder  Looks behind from both sides and weight shifts well    Turn 360 Degrees  Able to turn 360 degrees safely in 4 seconds or less    Standing Unsupported, Alternately Place Feet on Step/Stool  Able to stand independently and safely and complete 8 steps in 20 seconds    Standing Unsupported, One Foot in [place] to place foot tandem independently and hold 30  seconds    Standing on One Leg  Able to lift leg independently and hold > 10 seconds    Total Score  56      Self-Care   Other Self-Care Comments   discussed need for compliance with neck stretches in order to maximize benefit of DN; pt verbalized understanding; discussed current progress and POC; plan to hold x 30 days             PT Education - 06/23/17 1342    Education provided  Yes    Education Details  see self care    Person(s) Educated  Patient    Methods  Explanation    Comprehension  Verbalized understanding          PT Long Term Goals - 06/23/17 1343      PT LONG TERM GOAL #1   Title  Patient will be independent with HEP and able to verbalize plans for return to community-based exercise program. (Target all LTGs 05/25/17)    Status  Achieved      PT LONG TERM GOAL #2   Title  Patient will demonstrate lesser  fall risk with score of >22 on FGA.    Status  Achieved      PT LONG TERM GOAL #3   Title  Patient will ambulate independently >1000 ft over level, unlevel outdoor surfaces with <6 inch drift off straight path.     Baseline  3/4: not formally assessed but pt reports back to baseline for mobility    Status  Achieved      PT LONG TERM GOAL #4   Title  Patient will score >=52 on Berg to indicate lesser fall risk.    Status  Achieved      PT LONG TERM GOAL #5   Title  pt will demonstrate decreased head/neck tightness and decreased symptoms for improved function    Status  Achieved            Plan - 06/23/17 1344    Clinical Impression Statement  Pt has met all goals, but plan to hold x 30 days in case symptoms return.  Will reassess if symptoms return.    PT Treatment/Interventions  ADLs/Self Care Home Management;Gait training;Neuromuscular re-education;Balance training;Therapeutic exercise;Therapeutic activities;Functional mobility training;Stair training;Patient/family education;Vestibular;Dry needling;Visual/perceptual remediation/compensation    PT Next Visit Plan  hold x 30 days    Consulted and Agree with Plan of Care  Patient       Patient will benefit from skilled therapeutic intervention in order to improve the following deficits and impairments:  Abnormal gait, Decreased balance, Decreased mobility, Decreased coordination, Dizziness, Impaired sensation, Impaired vision/preception, Impaired UE functional use  Visit Diagnosis: Dizziness and giddiness  Other symptoms and signs involving the nervous system  Other abnormalities of gait and mobility     Problem List Patient Active Problem List   Diagnosis Date Noted  . Dizziness 05/29/2017  . AKI (acute kidney injury) ([place])   . Blood glucose elevated   . Generalized anxiety disorder   . Transaminitis   . CVA (cerebral vascular accident) ([place]) 04/17/2017  . 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 Altered sensation due to  recent stroke   . Anxiety state   . Attention deficit disorder   . Hypokalemia   . Stroke, Wallenberg's syndrome   . Stroke ([place]) 04/11/2017  . Hyperlipidemia 04/11/2017  . 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 Anxiety       Laureen Abrahams,  PT, DPT 06/23/17 1:45 PM    [place] [address] [place], [place], [address] Phone: [phone]   Fax:  [phone]  Name: AADYN BUCHHEIT MRN: [id number] Date of Birth: 11-15-1975       PHYSICAL THERAPY DISCHARGE SUMMARY  Visits from Start of Care: 12  Current functional level related to goals / functional outcomes: See above   Remaining deficits: See above, held PT as all goals met and pt was to call if symptoms returned.  Pt did not call so assume doing well.   Education / Equipment: HEP  Plan: Patient agrees to discharge.  Patient goals were met. Patient is being discharged due to meeting the stated rehab goals.  ?????     Laureen Abrahams, PT, DPT 07/29/17 4:17 PM  [facility] [address] [place], [place] [address]  [phone] (office) [phone] (fax)

## 2017-06-24 DIAGNOSIS — F4322 Adjustment disorder with anxiety: Secondary | ICD-10-CM | POA: Diagnosis not present

## 2017-06-25 ENCOUNTER — Encounter
Admit: 2017-06-25 | Discharge: 2017-06-26 | Payer: PRIVATE HEALTH INSURANCE | Attending: Cardiovascular Disease | Primary: Cardiovascular Disease

## 2017-06-25 DIAGNOSIS — R0789 Other chest pain: Principal | ICD-10-CM

## 2017-06-25 DIAGNOSIS — Z6825 Body mass index (BMI) 25.0-25.9, adult: Secondary | ICD-10-CM | POA: Diagnosis not present

## 2017-06-25 NOTE — Unmapped (Addendum)
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   Currently your headache and additional numbness on your face seem related to a tension headache and stress.    We are proceeding with re-qualify you for pcsk9 and for newer therapies for elevated Lp(a).    We recommend you stop your aspirn and plavix and begin elequis 2.5 mg twice a day.

## 2017-06-26 DIAGNOSIS — G44209 Tension-type headache, unspecified, not intractable: Secondary | ICD-10-CM | POA: Diagnosis not present

## 2017-06-26 DIAGNOSIS — R779 Abnormality of plasma protein, unspecified: Secondary | ICD-10-CM | POA: Diagnosis not present

## 2017-06-26 DIAGNOSIS — M542 Cervicalgia: Secondary | ICD-10-CM | POA: Diagnosis not present

## 2017-06-26 DIAGNOSIS — Z6825 Body mass index (BMI) 25.0-25.9, adult: Secondary | ICD-10-CM | POA: Diagnosis not present

## 2017-06-26 NOTE — Unmapped (Signed)
Cardiology Consultation Note    Visit Date: May 14, 2017    Requesting Provider: Donnie Mesa, MD   Primary Provider: Donnie Mesa, MD     Reason for Consult:   High Lp(a) and PCSK-9 referral      Assessment & Plan:  Dakota Galvan is a 42 y.o man a history of recent stroke on 04/11/2017 and high Lp(a), who has been referred for evaluation of high Lp(a).    Stroke:  He had a recent stroke in 04/11/2017.  During that stroke, multiple evaluations were conducted.  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 He has few arteriosclerotic risk factors: A. Lipoprotein  little aof 175mg /dL (no result available to [place] in Care Everywhere) B. Small PFO C. Family history of VTE, sister with DVT/PE at young age and MI at age 79.  No identifiable etiology currently of plaque, thrombosis, dissection, or congenital aplastic artery.  Last lipid panel was very good with LDL 57, HDL 71, and Hgb A1c 5.4. Hypercoagulable workup was negative.  Fibrinogen and PNH were negative.  His Jak-2 mutation is pending.  Per pt report, telemetry was negative for afib.  TTE reported normal EF 55-60% and slight AI and small PFO.  Pt has reported for past couple of years, frequent chest pains resulting in ED visits.  Although he has a diagnosis of anxiety which was treated with CBT, he still experienced these chest pains.  We'd like to do a long term monitor study to rule out any potential arrhythmias as a cause.  Additionally, we'd like to follow up on the Lp(a) with apo-b labs to get a better understanding of what could have led to this stroke.  Niacin and PCSK-9 inhibitor treatment can work in reducing Lp(a); however, there is no known evidence of reduction of outcomes with niacin treatment.  Recent meta-analysis of 12 RCTs for PCSK-9 inhibitors showed a 26% reduction of Lp(a); however, no studies have been published showing any description of long term effects on outcomes.  We are continuing him on Crestor 20 mg daily.    Echo shows pfo  Pt admitted to [facility] for paresthesia in arm and face. W/U negative included CNS imaging.   Now has lt sided cap like headache for past 2 days. Adl facial paresthesias   Impression:  Tension headaches, anxiety, no evidence of repeat cva    - Continue Crestor 20 mg qd  -stop aspirin and plavix and begin low dose 2.5 eliquis. (pt declined as is getting different advise from neurology and wants pcp to coordinate recs)  - pt did not qualify for pcsk9, have appealed and awaiting results   - 2 week monitor shows no serious rhythmsng  - Jak-2 mutation negative  Discussed with pharm company [employer] use of investigational drug for Lp(a), declined   -   - F/U in 4 weeks    High Lp(a):  Has been historically elevated as low as 175 to as high as 214.  As stated above, We are obtaining apo(b) labs and continuing him on Crestor 20 mg daily.  After review of literature advise low dose 2.5 elequis for cryptogenic stroke and elevated lp(a)t  -  Chest pain:  , Pt has reported for past couple of years, frequent chest pains resulting in ED visits.  Although he has a diagnosis of anxiety which was treated with CBT, he still experienced these chest  pains. Dakota Galvan has reported decreased exercise tolerance, increased shortness of breath, and increased dizzy spells for the past couple years.  No long-term monitor or stress test evaluation has been conducted.  He reportedly had a CT angiogram showing negative calcification in his heart.  ECG today was negative for arrhythmia.  Since his stroke, pt has not exercised.  We can consider stress testing should pt be able to exercise per his neurologist recommendations.    - 2 week long term Holter monitor pending  - Continue Crestor 20 mg qd  - Continue ASA as above  - Can consider stress test in the future when pt is able to exercise per neurologist recommendations    Return in about 4 weeks (around 07/23/2017).    History of Present Illness:  Dakota Galvan is a 42 y.o. male with a history of recent stroke on 04/11/2017 and high Lp(a), who has been referred for evaluation of high Lp(a).    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  He's had TEE showing small PFO, negative PVL LE and UE, and negative hypercoagulable evaluation.  Additionally, patient has negative Factor VIII, negative fibrinogen and PNH labs.  His Jak-2 mutation lab is still pending.  Patient reports for past 2 years, he has experienced chest pains, shortness of breath, decreased exercise tolerance, and dizzy spells.  He was referred for evaluation of anxiety and has had frequent ED visits in the past 2 years.  Additionally, he has gone through CBT treatment for anxiety; however, he continued to have chest pains.   He reports telemetry was negative for afib during his hospital stay.  He reports having no stress test.     FH:  Pt's mother had phlebitis, DVTs, and hardened arteries in leg.  Maternal grandfather died suddenly in his mid 46s in the office suspected to be heart attack.  Pt's sister is 40 and had multiple PE and DVT during pregnancy.  At 45, she had MI treated with thrombectomy.  It is unclear if she had a dissection.  She is seen and treated by [facility].  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  Additionally, he reportedly drinks 10-14 drinks a week, has no tobacco use, and has no illicit drug use.    Past Medical History:   Diagnosis Date   ??? Stroke (CMS-HCC)        Past Surgical History:  No past surgical history on file.    Medications:  Current Outpatient Prescriptions   Medication Sig Dispense Refill   ??? aspirin (ECOTRIN) 81 MG tablet Take 81 mg by mouth daily.     ??? clopidogrel (PLAVIX) 75 mg tablet Take 75 mg by mouth.     ??? rosuvastatin (CRESTOR) 20 MG tablet Take 20 mg by mouth.     ??? aspirin 325 MG tablet Take 325 mg by mouth.     ??? evolocumab (REPATHA SURECLICK) 140 mg/mL PnIj Inject 140 mg under the skin every fourteen (14) days. (Patient not taking: Reported on 05/19/2017) 2 Syringe 11   ??? STRATTERA 10 mg capsule Take by mouth.  11     No current facility-administered medications for this visit.        Allergies:  Allergies   Allergen Reactions   ??? Penicillins Hives  Has patient had a PCN reaction causing immediate rash, facial/tongue/throat swelling, SOB or lightheadedness with hypotension: YES  Has patient had a PCN reaction causing severe rash involving mucus membranes or skin necrosis: NO  Has patient had a PCN reaction that required hospitalizationNO  Has patient had a PCN reaction occurring within the last 10 years: NO  If all of the above answers are NO, then may proceed with Cephalosporin use.       Social History:  Social History     Social History   ??? Marital status: Single     Spouse name: N/A   ??? Number of children: N/A   ??? Years of education: N/A     Occupational History   ??? Not on file.     Social History Main Topics   ??? Smoking status: Former Smoker     Quit date: 12/28/2015   ??? Smokeless tobacco: Never Used      Comment: Social smoker ages 76 to 60, has quit.    ??? Alcohol use Yes      Comment: 3 drinks/day   ??? Drug use: No   ??? Sexual activity: Not on file     Other Topics Concern   ??? Not on file     Social History Narrative   ??? No narrative on file       Family History:  Family History   Problem Relation Age of Onset   ??? Heart attack Sister 27       Review of Systems:  A 12-system review of systems was performed and was negative except as noted in the HPI.    Physical Exam:  VITAL SIGNS:   Vitals:    06/25/17 1040   BP: 124/84   Pulse: 91   SpO2: 99%   Weight: 78.4 kg (172 lb 12.8 oz)     Body mass index is 25.59 kg/m??.  No change on pe  GENERAL: pleasant man sitting n the chair HEENT: PERRL, EOMI, OP clear with MMM.   NECK: Supple without LAD, TM, JVD, or HJR.  RESPIRATORY: CTA bilaterally without wheezes or crackles.  Normal WOB.  CARDIOVASCULAR: RRR without m/r/g.no click  ABDOMEN: NABS present.  Soft, NT/ND without HSM.  EXTREMITIES:  No LE edema.  2+ radial, PT, and DP pulses bilaterally  NEURO:  5/5 strength both UE and LE, (-) Pronator drift, R side facial sensation numb, and L arm and leg temperature sensation abnormal, dtr in arms hyperreflex    Pertinent Test Results:  ECG: 05/14/2017    Cardiographics  04/12/2017 Echocardiogram (TTE): normal EF 55-60%; trace AI;    Imaging  04/28/2017 PVL duplex upper extremity:  No evidence of DVT detected in central or arm veins  04/12/2017  (-) CT head w/o contrast, MRI brain showing acute nonhemorrhagic sub-centimeter right interior cerebellar and right medullary infarct)  MRA head showing right vertebral V3 and V4 segment occlusions  CTA neck showing occlusion of the right vertebral artery at the level of the V3 segment.      Lab Review   Reviewed in Epic & Care Everywhere  (-) hypercoagulable work up 03/2017  (-) Factor VIII 03/2017  Low total protein   (-) PNH screen 04/28/2017  (-) fibrinogen 04/28/2017  JAK2 mutation pending 04/28/2017

## 2017-07-01 ENCOUNTER — Other Ambulatory Visit: Payer: Self-pay | Admitting: Internal Medicine

## 2017-07-01 DIAGNOSIS — G44209 Tension-type headache, unspecified, not intractable: Secondary | ICD-10-CM

## 2017-07-01 DIAGNOSIS — M542 Cervicalgia: Secondary | ICD-10-CM

## 2017-07-06 ENCOUNTER — Other Ambulatory Visit: Payer: BLUE CROSS/BLUE SHIELD

## 2017-07-10 ENCOUNTER — Other Ambulatory Visit: Payer: BLUE CROSS/BLUE SHIELD

## 2017-07-13 ENCOUNTER — Ambulatory Visit
Admission: RE | Admit: 2017-07-13 | Discharge: 2017-07-13 | Disposition: A | Discharge status: Home / Self Care | Payer: BLUE CROSS/BLUE SHIELD | Source: Ambulatory Visit | Attending: Internal Medicine | Admitting: Internal Medicine

## 2017-07-13 DIAGNOSIS — G44209 Tension-type headache, unspecified, not intractable: Secondary | ICD-10-CM

## 2017-07-13 DIAGNOSIS — M542 Cervicalgia: Secondary | ICD-10-CM

## 2017-07-13 DIAGNOSIS — M50223 Other cervical disc displacement at C6-C7 level: Secondary | ICD-10-CM | POA: Diagnosis not present

## 2017-07-14 ENCOUNTER — Telehealth: Payer: Self-pay | Admitting: Neurology

## 2017-07-14 DIAGNOSIS — M5481 Occipital neuralgia: Secondary | ICD-10-CM

## 2017-07-14 NOTE — Telephone Encounter (Signed)
I received a telephone call from Dr. Clelia CroftShaw patient's primary physician to inform me that patient is having new onset of occipital headache and neck pain which he has not responded to usual therapy and required neurological opinion. I recommend patient see my partner Dr. Lucia GaskinsAhern Who specializes in headaches. I   placed the consult in Epic. We also discussed role of anticoagulation for his elevated lipoprotein and I recommended continuing antiplatelet therapy as anticoagulation with eliquis has not been shown to be beneficial for non-cardioembolic stroke patients

## 2017-07-23 DIAGNOSIS — F4322 Adjustment disorder with anxiety: Secondary | ICD-10-CM | POA: Diagnosis not present

## 2017-07-29 DIAGNOSIS — F902 Attention-deficit hyperactivity disorder, combined type: Secondary | ICD-10-CM | POA: Diagnosis not present

## 2017-07-29 DIAGNOSIS — F419 Anxiety disorder, unspecified: Secondary | ICD-10-CM | POA: Diagnosis not present

## 2017-07-29 DIAGNOSIS — F900 Attention-deficit hyperactivity disorder, predominantly inattentive type: Secondary | ICD-10-CM | POA: Diagnosis not present

## 2017-07-31 ENCOUNTER — Encounter
Payer: BLUE CROSS/BLUE SHIELD | Attending: Physical Medicine & Rehabilitation | Admitting: Physical Medicine & Rehabilitation

## 2017-07-31 ENCOUNTER — Encounter: Payer: Self-pay | Admitting: Physical Medicine & Rehabilitation

## 2017-07-31 ENCOUNTER — Other Ambulatory Visit: Payer: Self-pay

## 2017-07-31 VITALS — BP 134/85 | HR 80 | Ht 70.0 in | Wt 182.2 lb

## 2017-07-31 DIAGNOSIS — F419 Anxiety disorder, unspecified: Secondary | ICD-10-CM | POA: Insufficient documentation

## 2017-07-31 DIAGNOSIS — Z8673 Personal history of transient ischemic attack (TIA), and cerebral infarction without residual deficits: Secondary | ICD-10-CM | POA: Insufficient documentation

## 2017-07-31 DIAGNOSIS — G463 Brain stem stroke syndrome: Secondary | ICD-10-CM | POA: Diagnosis not present

## 2017-07-31 DIAGNOSIS — R269 Unspecified abnormalities of gait and mobility: Secondary | ICD-10-CM | POA: Diagnosis not present

## 2017-07-31 DIAGNOSIS — R209 Unspecified disturbances of skin sensation: Secondary | ICD-10-CM | POA: Diagnosis not present

## 2017-07-31 DIAGNOSIS — M791 Myalgia, unspecified site: Secondary | ICD-10-CM | POA: Diagnosis not present

## 2017-07-31 DIAGNOSIS — Z87891 Personal history of nicotine dependence: Secondary | ICD-10-CM | POA: Insufficient documentation

## 2017-07-31 DIAGNOSIS — Z9889 Other specified postprocedural states: Secondary | ICD-10-CM | POA: Diagnosis not present

## 2017-07-31 DIAGNOSIS — G441 Vascular headache, not elsewhere classified: Secondary | ICD-10-CM

## 2017-07-31 DIAGNOSIS — Q211 Atrial septal defect: Secondary | ICD-10-CM | POA: Diagnosis not present

## 2017-07-31 DIAGNOSIS — R739 Hyperglycemia, unspecified: Secondary | ICD-10-CM | POA: Insufficient documentation

## 2017-07-31 DIAGNOSIS — E785 Hyperlipidemia, unspecified: Secondary | ICD-10-CM | POA: Diagnosis not present

## 2017-07-31 DIAGNOSIS — I69398 Other sequelae of cerebral infarction: Secondary | ICD-10-CM | POA: Diagnosis not present

## 2017-07-31 DIAGNOSIS — F411 Generalized anxiety disorder: Secondary | ICD-10-CM | POA: Diagnosis not present

## 2017-07-31 DIAGNOSIS — R42 Dizziness and giddiness: Secondary | ICD-10-CM | POA: Diagnosis not present

## 2017-07-31 DIAGNOSIS — Z8249 Family history of ischemic heart disease and other diseases of the circulatory system: Secondary | ICD-10-CM | POA: Insufficient documentation

## 2017-07-31 DIAGNOSIS — R200 Anesthesia of skin: Secondary | ICD-10-CM | POA: Insufficient documentation

## 2017-07-31 NOTE — Progress Notes (Signed)
Subjective:    Patient ID: Jake Moore, male    DOB: 05-Mar-1976, 42 y.o.   MRN: [id number]  HPI 42 y.o. right handed male with history of anxiety/ADD, hyperlipidemia who presents for follow up for Wallenburg Syndrome.   Last clinic visit 05/02/17.  Since that time, pt went to the ED for dizziness/vertigo but was told he did not have a new stroke.  Notes reviewed. Since last visit, he has completed therapies.  He continues to follow up with [facility] and is awaiting decision with blood thinner. He is following up with Neuro as well. No plans for intervention for PFO. His main complaints is headaches, which is being followed by Neuro. Muscle pain has resolved. He still has issues with numbness and temperature sensation.  Denies falls.  Pain Inventory Average Pain 6 Pain Right Now 3 My pain is sharp, burning, dull and stabbing  In the last 24 hours, has pain interfered with the following? General activity 4 Relation with others 2 Enjoyment of life 2 What TIME of day is your pain at its worst? varies Sleep (in general) NA  Pain is worse with: unsure Pain improves with: therapy/exercise Relief from Meds: 3  Mobility walk without assistance  Function employed # of hrs/week 40  Neuro/Psych numbness tingling dizziness  Prior Studies Any changes since last visit?  no  Physicians involved in your care Any changes since last visit?  no   Family History  Problem Relation Age of Onset  . Other Sister        hypercoagulable state with SCAD  . CAD Maternal Grandfather    Social History   Socioeconomic History  . Marital status: Single    Spouse name: Not on file  . Number of children: Not on file  . Years of education: Not on file  . Highest education level: Not on file  Occupational History  . Not on file  Social Needs  . Financial resource strain: Not on file  . Food insecurity:    Worry: Not on file    Inability: Not on file  . Transportation needs:    Medical:  Not on file    Non-medical: Not on file  Tobacco Use  . Smoking status: Former Smoker    Years: 20.00    Types: Cigarettes    Last attempt to quit: 01/15/2016    Years since quitting: 1.5  . Smokeless tobacco: Former Neurosurgeon    Types: Chew  Substance and Sexual Activity  . Alcohol use: Yes    Alcohol/week: 6.0 oz    Types: 10 Cans of beer per week    Comment: social 10  beers per week  . Drug use: No  . Sexual activity: Yes  Lifestyle  . Physical activity:    Days per week: Not on file    Minutes per session: Not on file  . Stress: Not on file  Relationships  . Social connections:    Talks on phone: Not on file    Gets together: Not on file    Attends religious service: Not on file    Active member of club or organization: Not on file    Attends meetings of clubs or organizations: Not on file    Relationship status: Not on file  Other Topics Concern  . Not on file  Social History Narrative  . Not on file   Past Surgical History:  Procedure Laterality Date  . TEE WITHOUT CARDIOVERSION N/A 04/14/2017   Procedure: TRANSESOPHAGEAL  ECHOCARDIOGRAM (TEE);  Surgeon: Dolores PattyBensimhon, Daniel R, MD;  [facility];  Service: Cardiovascular;  Laterality: N/A;   Past Medical History:  Diagnosis Date  . Anxiety   . CVA (cerebral vascular accident) (HCC)    BP 134/85   Pulse 80   Ht 5\' 10"  (1.778 m)   Wt 182 lb 3.2 oz (82.6 kg)   SpO2 97%   BMI 26.14 kg/m   Opioid Risk Score:   Fall Risk Score:  `1  Depression screen PHQ 2/9  Depression screen [facility] 07/31/2017 05/02/2017  Decreased Interest 0 0  Down, Depressed, Hopeless 0 0  PHQ - 2 Score 0 0     Review of Systems  Constitutional: Negative.   HENT: Negative.   Eyes: Negative.   Respiratory: Negative.   Cardiovascular: Negative.   Gastrointestinal: Negative.   Endocrine: Negative.   Genitourinary: Negative.   Musculoskeletal: Negative.   Skin: Negative.   Allergic/Immunologic: Negative.   Neurological:  Positive for dizziness and headaches.  Hematological: Negative.   Psychiatric/Behavioral: Negative.   All other systems reviewed and are negative.      Objective:   Physical Exam Constitutional: He appears well-developed and well-nourished. No distress.  HENT: Normocephalic and atraumatic.  Eyes: EOM are normal. No discharge. Cardiovascular: RRR. No JVD. Respiratory: Effort normal breath sounds normal.  GI: Bowel sounds are normal. He exhibits no distension.  Musculoskeletal: He exhibits no edema or tenderness.  Neurological: He is alert and oriented.  Speech clear.  Able to follow commands without difficulty.  Motor: 5/5 throughout No ataxia B/l UE Reduced right facial sensation to touch. Sensation diminished to light touch right face and LUE/LLE Skin: Skin is warm and dry. He is not diaphoretic.  Psychiatric: He has a normal mood and affect. His behavior is normal. Judgment and thought content normal.     Assessment & Plan:  42 y.o. right handed male with history of anxiety/ADD, hyperlipidemia who presents for follow up after receiving CIR for Wallenburg Syndrome.   1. 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 Small PFO:    No need for closure per pt  3. Anxiety disorder  Cont CBT  Consider meds if necessary  4. Vascular headaches  Being follow up Neuro

## 2017-08-06 ENCOUNTER — Encounter
Admit: 2017-08-06 | Discharge: 2017-08-07 | Payer: PRIVATE HEALTH INSURANCE | Attending: Cardiovascular Disease | Primary: Cardiovascular Disease

## 2017-08-06 DIAGNOSIS — I639 Cerebral infarction, unspecified: Principal | ICD-10-CM

## 2017-08-06 DIAGNOSIS — Z6827 Body mass index (BMI) 27.0-27.9, adult: Secondary | ICD-10-CM | POA: Diagnosis not present

## 2017-08-06 MED ORDER — APIXABAN 5 MG TABLET
ORAL_TABLET | Freq: Two times a day (BID) | ORAL | 0 refills | 0.00000 days | Status: CP
Start: 2017-08-06 — End: 2018-09-06

## 2017-08-06 NOTE — Unmapped (Signed)
Cardiology Consultation Note    Follow up     Requesting Provider: Donnie Mesa, MD   Primary Provider: Donnie Mesa, MD     Reason for Consult:   High Lp(a) and PCSK-9 referral follow up on anticoag and lipid rx     Assessment & Plan:  Mr. Bartnik is a 42 y.o man a history of recent stroke on 04/11/2017 and high Lp(a), who has been referred for evaluation of high Lp(a).    Stroke:  He had a recent stroke in 04/11/2017.  During that stroke, multiple evaluations were conducted.  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 He has few arteriosclerotic risk factors: A. Lipoprotein  little aof 175mg /dL (no result available to [place] in Care Everywhere) B. Small PFO C. Family history of VTE, sister with DVT/PE at young age and MI at age 79.  No identifiable etiology currently of plaque, thrombosis, dissection, or congenital aplastic artery.  Last lipid panel was very good with LDL 57, HDL 71, and Hgb A1c 5.4. Hypercoagulable workup was negative.  Fibrinogen and PNH were negative.  His Jak-2 mutation is pending.  Per pt report, telemetry was negative for afib.  TTE reported normal EF 55-60% and slight AI and small PFO.  Pt has reported for past couple of years, frequent chest pains resulting in ED visits.  Although he has a diagnosis of anxiety which was treated with CBT, he still experienced these chest pains.  We'd like to do a long term monitor study to rule out any potential arrhythmias as a cause.  Additionally, we'd like to follow up on the Lp(a) with apo-b labs to get a better understanding of what could have led to this stroke.  Niacin and PCSK-9 inhibitor treatment can work in reducing Lp(a); however, there is no known evidence of reduction of outcomes with niacin treatment.  Recent meta-analysis of 12 RCTs for PCSK-9 inhibitors showed a 26% reduction of Lp(a); however, no studies have been published showing any description of long term effects on outcomes.  We are continuing him on Crestor 20 mg daily.  Should be able to stop once on pcsk9  Echo shows pfo, good lv function  Again discussed advantage of  adding apix 5mg  bid:   done  Pt admitted to [facility] for paresthesia in arm and face. W/U negative included CNS imaging.   Now has lt sided cap like headache, improved  impression:  Tension headaches, anxiety, no evidence of repeat cva    - Continue Crestor 20 mg qd  -stop aspirin and plavix and begin 5mg  5 eliquis. (pt initially declined as is getting different advise from neurology and wants pcp to coordinate recs, readdressed this visit and pt agrees to potential benefit)  - pt did not qualify for pcsk9, have appealed and awaiting results   - 2 week monitor shows no serious rhythmsng  - Jak-2 mutation negative  Discussed with pharm company [employer] use of investigational drug for Lp(a), declined   -   - F/U in 8 weeks    High Lp(a):  Has been historically elevated as low as 175 to as high as 214.  As stated above, We are obtaining apo(b) labs and continuing him on Crestor 20 mg daily.  After review of literature advise apix 5mg  bid for cryptogenic stroke and elevated lp(a)t  -  Chest pain:  , Pt has reported for past  couple of years, frequent chest pains resulting in ED visits.  Although he has a diagnosis of anxiety which was treated with CBT, he still experienced these chest pains. Mr. Disano has reported decreased exercise tolerance, increased shortness of breath, and increased dizzy spells for the past couple years.  No long-term monitor or stress test evaluation has been conducted.  He reportedly had a CT angiogram showing negative calcification in his heart.  ECG today was negative for arrhythmia.  Since his stroke, pt has not exercised.  We can consider stress testing should pt be able to exercise per his neurologist recommendations.    - 2 week long term Holter monitor; Benign, no major arrhythmia pending  - Continue Crestor 20 mg qd for now  - dc ASA as above  - Can consider stress test in the future     History of Present Illness:  Mr. Kinne is a 42 y.o. male with a history of recent stroke on 04/11/2017 and high Lp(a), who has been referred for evaluation of high Lp(a).    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  He's had TEE showing small PFO, negative PVL LE and UE, and negative hypercoagulable evaluation.  Additionally, patient has negative Factor VIII, negative fibrinogen and PNH labs.  His Jak-2 mutation lab is still pending.  Patient reports for past 2 years, he has experienced chest pains, shortness of breath, decreased exercise tolerance, and dizzy spells.  He was referred for evaluation of anxiety and has had frequent ED visits in the past 2 years.  Additionally, he has gone through CBT treatment for anxiety; however, he continued to have chest pains.   He reports telemetry was negative for afib during his hospital stay.  He reports having no stress test.     FH:  Pt's mother had phlebitis, DVTs, and hardened arteries in leg.  Maternal grandfather died suddenly in his mid 27s in the office suspected to be heart attack.  Pt's sister is 16 and had multiple PE and DVT during pregnancy.  At 45, she had MI treated with thrombectomy.  It is unclear if she had a dissection.  She is seen and treated by [facility].  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  Additionally, he reportedly drinks 10-14 drinks a week, has no tobacco use, and has no illicit drug use.    Past Medical History:   Diagnosis Date   ??? Stroke (CMS-HCC)        Past Surgical History:  No past surgical history on file.    Medications:  Current Outpatient Medications   Medication Sig Dispense Refill   ??? aspirin (ECOTRIN) 81 MG tablet Take 81 mg by mouth daily.     ??? rosuvastatin (CRESTOR) 20 MG tablet Take 20 mg by mouth.     ??? DULoxetine (CYMBALTA) 30 MG capsule Take 1 capsule by mouth.     ??? evolocumab (REPATHA SURECLICK) 140 mg/mL PnIj Inject 140 mg under the skin every fourteen (14) days. (Patient not taking: Reported on 05/19/2017) 2 Syringe 11   ??? STRATTERA 10 mg capsule Take by mouth.  11     No current facility-administered medications for this visit.        Allergies:  Allergies   Allergen Reactions   ??? Penicillins Hives  Has patient had a PCN reaction causing immediate rash, facial/tongue/throat swelling, SOB or lightheadedness with hypotension: YES  Has patient had a PCN reaction causing severe rash involving mucus membranes or skin necrosis: NO  Has patient had a PCN reaction that required hospitalizationNO  Has patient had a PCN reaction occurring within the last 10 years: NO  If all of the above answers are NO, then may proceed with Cephalosporin use.   ??? Propofol Analogues Anaphylaxis     Decreased BP and seizure type body reactions.       Social History:  Social History     Socioeconomic History   ??? Marital status: Single     Spouse name: Not on file   ??? Number of children: Not on file   ??? Years of education: Not on file   ??? Highest education level: Not on file   Occupational History   ??? Not on file   Social Needs   ??? Financial resource strain: Not on file   ??? Food insecurity:     Worry: Not on file     Inability: Not on file   ??? Transportation needs:     Medical: Not on file     Non-medical: Not on file   Tobacco Use   ??? Smoking status: Former Smoker     Last attempt to quit: 12/28/2015     Years since quitting: 1.6   ??? Smokeless tobacco: Never Used   ??? Tobacco comment: Social smoker ages 71 to 54, has quit.    Substance and Sexual Activity   ??? Alcohol use: Yes     Comment: 3 drinks/day   ??? Drug use: No   ??? Sexual activity: Not on file   Lifestyle   ??? Physical activity:     Days per week: Not on file     Minutes per session: Not on file   ??? Stress: Not on file   Relationships   ??? Social connections:     Talks on phone: Not on file     Gets together: Not on file     Attends religious service: Not on file     Active member of club or organization: Not on file     Attends meetings of clubs or organizations: Not on file     Relationship status: Not on file   Other Topics Concern   ??? Not on file   Social History Narrative   ??? Not on file       Family History:  Family History   Problem Relation Age of Onset   ??? Heart attack Sister 38       Review of Systems:  A 12-system review of systems was performed and was negative except as noted in the HPI.    Physical Exam:  VITAL SIGNS:   Vitals:    08/06/17 1031   BP: 118/82   Pulse: 72   SpO2: 98%   Weight: 83.5 kg (184 lb 1.6 oz)   Height: 175.3 cm (5' 9)     Body mass index is 27.19 kg/m??.  No change on pe from last visit  GENERAL: pleasant man sitting n the chair   HEENT: PERRL, EOMI, OP clear with MMM.   NECK: Supple without LAD, TM, JVD, or HJR.  RESPIRATORY: CTA bilaterally without wheezes or crackles.  Normal WOB.  CARDIOVASCULAR: RRR without m/r/g.no click  ABDOMEN: NABS present.  Soft, NT/ND without HSM.  EXTREMITIES:  No LE edema.  NEURO:  Grossly normal, no change    Pertinent Test Results:  ECG: 05/14/2017    Cardiographics  04/12/2017 Echocardiogram (TTE): normal EF 55-60%; trace AI;    Imaging  04/28/2017 PVL duplex upper extremity:  No evidence of DVT detected in central or arm veins  04/12/2017  (-) CT head w/o contrast, MRI brain showing acute nonhemorrhagic sub-centimeter right interior cerebellar and right medullary infarct)  MRA head showing right vertebral V3 and V4 segment occlusions  CTA neck showing occlusion of the right vertebral artery at the level of the V3 segment.      Lab Review   Reviewed in Epic & Care Everywhere  (-) hypercoagulable work up 03/2017  (-) Factor VIII 03/2017  Low total protein   (-) PNH screen 04/28/2017  (-) fibrinogen 04/28/2017  JAK2 mutation pending 04/28/2017

## 2017-08-06 NOTE — Unmapped (Addendum)
Good to see [facility]!   Based on our conversation we will start with Eliquis 5 mg twice a day. You can also stop the aspirin as this time. The Eliquis is the full stroke protection dose and given your overall health, there is no issues with providing a reduced dose.  We ask that you look for any signs of excessive bruising or  bleeding from the gums, nosebleeds or rectum which are typically a nuisance but not critical, and probably not all that likely.  We will also continue to work with [facility] cross from the access of a PCSK9 treatment and can review at a later date.   Please stop your low dose aspirin

## 2017-08-12 DIAGNOSIS — F4322 Adjustment disorder with anxiety: Secondary | ICD-10-CM | POA: Diagnosis not present

## 2017-08-25 DIAGNOSIS — F4322 Adjustment disorder with anxiety: Secondary | ICD-10-CM | POA: Diagnosis not present

## 2017-09-04 DIAGNOSIS — Z6826 Body mass index (BMI) 26.0-26.9, adult: Secondary | ICD-10-CM | POA: Diagnosis not present

## 2017-09-04 DIAGNOSIS — R197 Diarrhea, unspecified: Secondary | ICD-10-CM | POA: Diagnosis not present

## 2017-09-23 DIAGNOSIS — R197 Diarrhea, unspecified: Secondary | ICD-10-CM | POA: Diagnosis not present

## 2017-09-24 DIAGNOSIS — F4322 Adjustment disorder with anxiety: Secondary | ICD-10-CM | POA: Diagnosis not present

## 2017-10-06 DIAGNOSIS — R197 Diarrhea, unspecified: Secondary | ICD-10-CM | POA: Diagnosis not present

## 2017-10-06 DIAGNOSIS — Z6826 Body mass index (BMI) 26.0-26.9, adult: Secondary | ICD-10-CM | POA: Diagnosis not present

## 2017-10-06 DIAGNOSIS — R779 Abnormality of plasma protein, unspecified: Secondary | ICD-10-CM | POA: Diagnosis not present

## 2017-10-06 DIAGNOSIS — F411 Generalized anxiety disorder: Secondary | ICD-10-CM | POA: Diagnosis not present

## 2017-10-08 ENCOUNTER — Encounter
Admit: 2017-10-08 | Discharge: 2017-10-09 | Payer: PRIVATE HEALTH INSURANCE | Attending: Cardiovascular Disease | Primary: Cardiovascular Disease

## 2017-10-08 DIAGNOSIS — E7800 Pure hypercholesterolemia, unspecified: Principal | ICD-10-CM

## 2017-10-08 DIAGNOSIS — Z6827 Body mass index (BMI) 27.0-27.9, adult: Secondary | ICD-10-CM | POA: Diagnosis not present

## 2017-10-08 DIAGNOSIS — Z7982 Long term (current) use of aspirin: Secondary | ICD-10-CM | POA: Diagnosis not present

## 2017-10-08 DIAGNOSIS — E7841 Elevated Lipoprotein(a): Secondary | ICD-10-CM | POA: Diagnosis not present

## 2017-10-08 DIAGNOSIS — Z79899 Other long term (current) drug therapy: Secondary | ICD-10-CM | POA: Diagnosis not present

## 2017-10-08 DIAGNOSIS — Z8673 Personal history of transient ischemic attack (TIA), and cerebral infarction without residual deficits: Secondary | ICD-10-CM | POA: Diagnosis not present

## 2017-10-08 DIAGNOSIS — F4322 Adjustment disorder with anxiety: Secondary | ICD-10-CM | POA: Diagnosis not present

## 2017-10-08 NOTE — Unmapped (Signed)
You have had a good recovery from your CVA.   We are still attempting to qualify you for pcsk9 for your elevated Lp(a).

## 2017-10-13 NOTE — Unmapped (Signed)
Cardiology Consultation Note    Follow up     Requesting Provider: Donnie Mesa, MD   Primary Provider: Donnie Mesa, MD     Reason for Consult:   High Lp(a) and PCSK-9 referral follow up on anticoag and lipid rx     Assessment & Plan:  Dakota Galvan is a 42 y.o man a history of recent stroke on 04/11/2017 and high Lp(a), who has been referred for evaluation of high Lp(a).    Stroke:  He had a recent stroke in 04/11/2017.  During that stroke, multiple evaluations were conducted.  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 He has few arteriosclerotic risk factors: A. Lipoprotein  little aof 175mg /dL (no result available to [place] in Care Everywhere) B. Small PFO C. Family history of VTE, sister with DVT/PE at young age and MI at age 23.  No identifiable etiology currently of plaque, thrombosis, dissection, or congenital aplastic artery.  Last lipid panel was very good with LDL 57, HDL 71, and Hgb A1c 5.4. Hypercoagulable workup was negative.  Fibrinogen and PNH were negative.  His Jak-2 mutation is pending.  Per pt report, telemetry was negative for afib.  TTE reported normal EF 55-60% and slight AI and small PFO.  Pt has reported for past couple of years, frequent chest pains resulting in ED visits.  Although he has a diagnosis of anxiety which was treated with CBT, he still experienced these chest pains.  We'd like to do a long term monitor study to rule out any potential arrhythmias as a cause.  Additionally, we'd like to follow up on the Lp(a) with apo-b labs to get a better understanding of what could have led to this stroke.  Niacin and PCSK-9 inhibitor treatment can work in reducing Lp(a); however, there is no known evidence of reduction of outcomes with niacin treatment.  Recent meta-analysis of 12 RCTs for PCSK-9 inhibitors showed a 26% reduction of Lp(a); however, no studies have been published showing any description of long term effects on outcomes.  We are continuing him on Crestor 20 mg daily.  Should be able to stop once on pcsk9  Echo shows pfo, good lv function  Again discussed advantage of  adding apix 5mg  bid:   done  Pt admitted to [facility] for paresthesia in arm and face. W/U negative included CNS imaging.   Now lt sided cap like headache no longer present:  Tension headaches resolved, anxiety, no evidence of repeat cva    - Continue Crestor 20 mg qd  -stop aspirin and plavix and begin 5mg  5 eliquis. (pt initially declined as is getting different advise from neurology and wants pcp to coordinate recs, readdressed this visit and pt agrees to potential benefit)  - pt did not qualify for pcsk9, have appealed and awaiting results   - 2 week monitor shows no serious rhythmsng  - Jak-2 mutation negative  Discussed with pharm company [employer] use of investigational drug for Lp(a), declined   -   - F/U in 8 weeks    High Lp(a):  Has been historically elevated as low as 175 to as high as 214.  As stated above, We are obtaining apo(b) labs and continuing him on Crestor 20 mg daily.  After review of literature advise apix 5mg  bid for cryptogenic stroke and elevated lp(a)t  -  Chest pain:  , Pt has reported for past  couple of years, frequent chest pains resulting in ED visits.  Although he has a diagnosis of anxiety which was treated with CBT, he still experienced these chest pains. Dakota Galvan has reported decreased exercise tolerance, increased shortness of breath, and increased dizzy spells for the past couple years.  No long-term monitor or stress test evaluation has been conducted.  He reportedly had a CT angiogram showing negative calcification in his heart.  ECG last visit was negative for arrhythmia.  Since his stroke, pt has not exercised.  We can consider stress testing should pt be able to exercise per his neurologist recommendations.    - 2 week long term Holter monitor;   Benign, no major arrhythmia   - Continue Crestor 20 mg qd for now  - dc ASA as above  - Can consider stress test in the future     No change on history or physical.    To date pt has been declined for psck9, despite high level appeal of decision, cont current meds, reconsider reapeal in future.    History of Present Illness:  Dakota Galvan is a 42 y.o. male with a history of recent stroke on 04/11/2017 and high Lp(a), who has been referred for evaluation of high Lp(a).    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  He's had TEE showing small PFO, negative PVL LE and UE, and negative hypercoagulable evaluation.  Additionally, patient has negative Factor VIII, negative fibrinogen and PNH labs.  His Jak-2 mutation lab is still pending.  Patient reports for past 2 years, he has experienced chest pains, shortness of breath, decreased exercise tolerance, and dizzy spells.  He was referred for evaluation of anxiety and has had frequent ED visits in the past 2 years.  Additionally, he has gone through CBT treatment for anxiety; however, he continued to have chest pains.   He reports telemetry was negative for afib during his hospital stay.  He reports having no stress test.     FH:  Pt's mother had phlebitis, DVTs, and hardened arteries in leg.  Maternal grandfather died suddenly in his mid 76s in the office suspected to be heart attack.  Pt's sister is 67 and had multiple PE and DVT during pregnancy.  At 45, she had MI treated with thrombectomy.  It is unclear if she had a dissection.  She is seen and treated by [facility].  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  Additionally, he reportedly drinks 10-14 drinks a week, has no tobacco use, and has no illicit drug use.    Past Medical History:   Diagnosis Date   ??? Stroke (CMS-HCC) Past Surgical History:  No past surgical history on file.    Medications:  Current Outpatient Medications   Medication Sig Dispense Refill   ??? apixaban (ELIQUIS) 5 mg Tab Take 1 tablet (5 mg total) by mouth Two (2) times a day. 792 tablet 0   ??? aspirin (ECOTRIN) 81 MG tablet Take 81 mg by mouth daily.     ??? rosuvastatin (CRESTOR) 20 MG tablet Take 20 mg by mouth.     ??? sertraline (ZOLOFT) 50 MG tablet Take 50 mg by mouth daily at 0600.  10   ??? evolocumab (REPATHA SURECLICK) 140 mg/mL PnIj Inject 140 mg under the skin  every fourteen (14) days. (Patient not taking: Reported on 05/19/2017) 2 Syringe 11     No current facility-administered medications for this visit.        Allergies:  Allergies   Allergen Reactions   ??? Penicillins Hives     Has patient had a PCN reaction causing immediate rash, facial/tongue/throat swelling, SOB or lightheadedness with hypotension: YES  Has patient had a PCN reaction causing severe rash involving mucus membranes or skin necrosis: NO  Has patient had a PCN reaction that required hospitalizationNO  Has patient had a PCN reaction occurring within the last 10 years: NO  If all of the above answers are NO, then may proceed with Cephalosporin use.   ??? Propofol Analogues Anaphylaxis     Decreased BP and seizure type body reactions.       Social History:  Social History     Socioeconomic History   ??? Marital status: Single     Spouse name: Not on file   ??? Number of children: Not on file   ??? Years of education: Not on file   ??? Highest education level: Not on file   Occupational History   ??? Not on file   Social Needs   ??? Financial resource strain: Not on file   ??? Food insecurity:     Worry: Not on file     Inability: Not on file   ??? Transportation needs:     Medical: Not on file     Non-medical: Not on file   Tobacco Use   ??? Smoking status: Former Smoker     Last attempt to quit: 12/28/2015     Years since quitting: 1.7   ??? Smokeless tobacco: Never Used   ??? Tobacco comment: Social smoker ages 32 to 70, has quit.    Substance and Sexual Activity   ??? Alcohol use: Yes     Comment: 3 drinks/day   ??? Drug use: No   ??? Sexual activity: Not on file   Lifestyle   ??? Physical activity:     Days per week: Not on file     Minutes per session: Not on file   ??? Stress: Not on file   Relationships   ??? Social connections:     Talks on phone: Not on file     Gets together: Not on file     Attends religious service: Not on file     Active member of club or organization: Not on file     Attends meetings of clubs or organizations: Not on file     Relationship status: Not on file   Other Topics Concern   ??? Not on file   Social History Narrative   ??? Not on file       Family History:  Family History   Problem Relation Age of Onset   ??? Heart attack Sister 25       Review of Systems:  A 12-system review of systems was performed and was negative except as noted in the HPI.    Physical Exam:  VITAL SIGNS:   Vitals:    10/08/17 1035   BP: 114/72   Pulse: 74   SpO2: 98%   Weight: 84.1 kg (185 lb 8 oz)   Height: 175.3 cm (5' 9)     Body mass index is 27.39 kg/m??.  No change on pe from last 2 visit  GENERAL: pleasant man sitting n the chair   HEENT: PERRL, EOMI, OP  clear with MMM.   NECK: Supple without LAD, TM, JVD, or HJR.  RESPIRATORY: CTA bilaterally without wheezes or crackles.  Normal WOB.  CARDIOVASCULAR: RRR without m/r/g.no click  ABDOMEN: NABS present.  Soft, NT/ND without HSM.  EXTREMITIES:  No LE edema.    NEURO:  Grossly normal, no change    Pertinent Test Results:  ECG: 05/14/2017    Cardiographics  04/12/2017 Echocardiogram (TTE): normal EF 55-60%; trace AI;    Imaging  04/28/2017 PVL duplex upper extremity:  No evidence of DVT detected in central or arm veins  04/12/2017  (-) CT head w/o contrast, MRI brain showing acute nonhemorrhagic sub-centimeter right interior cerebellar and right medullary infarct)  MRA head showing right vertebral V3 and V4 segment occlusions  CTA neck showing occlusion of the right vertebral artery at the level of the V3 segment.      Lab Review   Reviewed in Epic & Care Everywhere  (-) hypercoagulable work up 03/2017  (-) Factor VIII 03/2017  Low total protein   (-) PNH screen 04/28/2017  (-) fibrinogen 04/28/2017  JAK2 mutation pending 04/28/2017

## 2017-11-05 DIAGNOSIS — F4322 Adjustment disorder with anxiety: Secondary | ICD-10-CM | POA: Diagnosis not present

## 2017-12-09 DIAGNOSIS — F4322 Adjustment disorder with anxiety: Secondary | ICD-10-CM | POA: Diagnosis not present

## 2017-12-16 ENCOUNTER — Ambulatory Visit (INDEPENDENT_AMBULATORY_CARE_PROVIDER_SITE_OTHER): Payer: BLUE CROSS/BLUE SHIELD | Admitting: Neurology

## 2017-12-16 ENCOUNTER — Encounter: Payer: Self-pay | Admitting: Neurology

## 2017-12-16 VITALS — BP 122/89 | HR 77 | Ht 70.0 in | Wt 196.6 lb

## 2017-12-16 DIAGNOSIS — I6990 Unspecified sequelae of unspecified cerebrovascular disease: Secondary | ICD-10-CM

## 2017-12-16 NOTE — Patient Instructions (Addendum)
I had a long discussion with the patient regarding his remote cryptogenic stroke as well as muscle tension headaches and answered questions. He seems to be doing well. Continue eliquis as per his cardiologist but consider stopping aspirin unless cardiologist promptly feel he needs both. I encouraged him to exercise regularly and lose some weight and eat a healthy diet. Maintain strict control of blood pressure with goal below 130/90 and lipids with LDL cholesterol goal below 70 mg percent. I encouraged him to do regular neck stretching exercises. No routine follow-up appointment with me is necessary but he may be referred back in the future as needed.

## 2017-12-16 NOTE — Progress Notes (Signed)
[facility] [address] [place]. [place] [address] [phone]       OFFICE FOLLOW-UP NOTE  Jake. Jake Moore Date of Birth:  12-07-1975 Medical Record Number:  [id number]   HPI: Jake Moore is a present 42 year old Caucasian male who is seen today for first office follow-up visit following hospital admission for stroke in December 2018. He is accompanied and his wife. History is obtained from the patient, wife and review of electronic medical records. I have personally reviewed imaging for lemons as well as his records from [facility] and [facility] in care everywhereRobert F Moore is an 42 y.o. male with HLD family history  significant for blood clots, prior tobacco use presents to the emergency room after being discharged yesterday for worsening dizziness, gait imbalance nausea headache and progressive of right-sided facial numbness. She came with same symptoms yesterday that started at 8:30 in the morning. During that visit his CT head was done and patient was given meclizine. Reviewing the note is unclear if the patient was made to walk prior to discharge. Patient states that symptoms worsened yesterday at 11 PM and numbness over his face extended from nose to the right half of his face. He states that he lifted weights the night before but denies recent chiropractic manipulation, twisting of his neck. He does complain of neck pain on the left side. His sister and mother had multiple clots. His sister event extensive evaluation including at [facility] and [facility] known to have higher levels of factor VIII and elevated lipoprotein A levels. He takes crestor at home, not on ASA. Date last known well: 12.20.18.Time last known well: 8.15 am.tPA Given: outside window.NIHSS: 1.Baseline MRS 0  CT scan showed no acute abnormality. 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 Otherwise CTA of the neck and brain was unremarkable. MRI scan  of the brain showed right lateral medullary as well as tiny right cerebellar infarct. Transthoracic echo showed normal ejection fraction. Transesophageal echo showed normal cardia Embolism but Showed a Small Right to Left Shunt.Transcranial Doppler Bubble Study Was Also Positive for a Small Right to Left Shunt Only.lupus anticoagulant by LA-PTT and DRVVT negative. Anticardiolipin antibodies negative. Anti-beta-2 glycoprotein 1 antibodies negative. Homocystine 7.3. HDL 71, LDL 57. Factor V Leiden and prothrombin 76546 mutations not present. Antithrombin activity, protein C activity and protein C antigen normal. Total protein S antigen and protein S activity normal. Sedimentation rate 1. C-reactive protein negative.  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 His sister had a DVT and had age of 65 who was known to have elevated factor VIII levels. The patient's factor VIII level was normal at 146 on 04/21/2007 and on 04/21/2014 it was minimally elevated at 175 which was clinically not significant..  Prothrombin Gene and Factor V Leiden Mutations Were Negative. Lower extremity venous Dopplers were negative for DVT. Hemoglobin A1c was 5.2. LDL cholesterol was 57 mg percent. Patient was started on dual antiplatelet therapy of aspirin and Plavix after being started initially on heparin for a few days. The etiology of patient's stroke was indeterminate patient denied significant neck pain or any physical exertion to suggest dissection at that time. He agreed to proceed in the [facility]. Feels transferred to inpatient rehabilitation but did well and is currently at all. He is subsequently had a second neurological opinion at [facility] neurology with Dr. Blondell Reveal as well as  hematologist' Dr Isaiah SergeMoll for family h/o elevated  factor 8 level. Patient was continued on aspirin and Plavix and not commended long-term decompression. Patient states his balance and gait have improved his has  some intermittent numbness in the left side of the face as well as some transient vertigo. He in fact was readmitted on 05/29/16 because of transient positional vertigo which resolved shortly after admission. This was felt to be peripheral positional vertigo. 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 Patient was seen by physical therapist advised him to vascular stabilization exercises as well as continue aspirin and Plavix and Crestor. Patient states his done well since discharge. No longer feeling dizzy. His been complaining of posterior neck pain and headaches and physical therapies have started dry needling is helping. He had a outpatient [facility] monitor [facility] on 05/19/17 which did not show any evidence of paroxysmal atrial fibrillation. He has in psychiatrist Dr. Evelene CroonKaur for his anxiety and ADHD and she is prescribed Celexa but he has not started it yet. The patient has started going back to work but states that he gets tired easily. Is planning a trip to the [place] and he wants to start exercising and increasing his physical activity gradually. Is tolerating aspirin and Plavix without bruising or bleeding. Is tolerating vascular muscle aches and pains. His blood pressure well controlled. It is 120/76. His considering starting PCS 9 iinhibotor injections for his elevated lipoprotein a and family history of premature coronary artery disease and his insurance has denied this and he will try to get samples from his cardiologist    Update 12/16/2017 : He returns follow-up after last visit 6 months ago. He continues to do well from the stroke standpoint without recurrent stroke or TIA symptoms. He still has occasional numbness on his face and body but it is not bothersome. He has been started on eliquis [facility] elevated lipoprotein.a  He also seems to be  taking a baby aspirin. Hishas not had any major bruising or bleeding episodes. He states his gained some weight and plans to exercise and lose it off. He states his posterior neck pain and headaches are much better. He was initially started on Zoloft by his primary physician which helped but he had some GI issues and more recently has been switched to Pristiq. His blood pressure is well controlled. He is tolerating Crestor well without any side effects. His insurance company denied Repatha injections for his elevated lipoprotein.    ROS:   14 system review of systems is positive for  numbness, neck pain,  Eye discharge, itching, light sensitivity  PMH:  Past Medical History:  Diagnosis Date  . Anxiety   . CVA (cerebral vascular accident) [facility](HCC)     Social History:  Social History   Socioeconomic History  . Marital status: Single    Spouse name: Not on file  . Number of children: Not on file  . Years of education: Not on file  . Highest education level: Not on file  Occupational History  . Not on file  Social Needs  . Financial resource strain: Not on file  . Food insecurity:    Worry: Not on file    Inability: Not on file  . Transportation needs:    Medical: Not on file    Non-medical: Not on file  Tobacco Use  .  Smoking status: Former Smoker    Years: 20.00    Types: Cigarettes    Last attempt to quit: 01/15/2016    Years since quitting: 1.9  . Smokeless tobacco: Former Neurosurgeon    Types: Chew  Substance and Sexual Activity  . Alcohol use: Yes    Alcohol/week: 10.0 standard drinks    Types: 10 Cans of beer per week    Comment: social 10  beers per week  . Drug use: No  . Sexual activity: Yes  Lifestyle  . Physical activity:    Days per week: Not on file    Minutes per session: Not on file  . Stress: Not on file  Relationships  . Social connections:    Talks on phone: Not on file    Gets together: Not on file    Attends religious service: Not on file    Active  member of club or organization: Not on file    Attends meetings of clubs or organizations: Not on file    Relationship status: Not on file  . Intimate partner violence:    Fear of current or ex partner: Not on file    Emotionally abused: Not on file    Physically abused: Not on file    Forced sexual activity: Not on file  Other Topics Concern  . Not on file  Social History Narrative  . Not on file    Medications:   Current Outpatient Medications on File Prior to Visit  Medication Sig Dispense Refill  . apixaban (ELIQUIS) 5 MG TABS tablet Take by mouth.    Marland Kitchen aspirin EC 81 MG tablet Take 81 mg by mouth daily.    Marland Kitchen desvenlafaxine (PRISTIQ) 50 MG 24 hr tablet Take 50 mg by mouth daily.    . rosuvastatin (CRESTOR) 10 MG tablet 20 mg.   10   No current facility-administered medications on file prior to visit.     Allergies:   Allergies  Allergen Reactions  . Penicillins Hives    Has patient had a PCN reaction causing immediate rash, facial/tongue/throat swelling, SOB or lightheadedness with hypotension: YES Has patient had a PCN reaction causing severe rash involving mucus membranes or skin necrosis: NO Has patient had a PCN reaction that required hospitalizationNO Has patient had a PCN reaction occurring within the last 10 years: NO If all of the above answers are "NO", then may proceed with Cephalosporin use. Has patient had a PCN reaction causing immediate rash, facial/tongue/throat swelling, SOB or lightheadedness with hypotension: YES Has patient had a PCN reaction causing severe rash involving mucus membranes or skin necrosis: NO Has patient had a PCN reaction that required hospitalizationNO Has patient had a PCN reaction occurring within the last 10 years: NO If all of the above answers are "NO", then may proceed with Cephalosporin use.    Physical Exam General: well developed, well nourished young Caucasian male, seated, in no evident distress Head: head normocephalic and  atraumatic.  Neck: supple with no carotid or supraclavicular bruits Cardiovascular: regular rate and rhythm, no murmurs Musculoskeletal: no deformity Skin:  no rash/petichiae Vascular:  Normal pulses all extremities Vitals:   12/16/17 0943  BP: 122/89  Pulse: 77   Neurologic Exam Mental Status: Awake and fully alert. Oriented to place and time. Recent and remote memory intact. Attention span, concentration and fund of knowledge appropriate. Mood and affect appropriate.  Cranial Nerves: Fundoscopic exam reveals sharp disc margins. Pupils equal, briskly reactive to light. Extraocular movements full without  nystagmus. Visual fields full to confrontation. Hearing intact. Facial sensation diminished on the left side Face, tongue, palate moves normally and symmetrically.  Motor: Normal bulk and tone. Normal strength in all tested extremity muscles. Sensory.: intact to touch ,pinprick .position and vibratory sensation.  Coordination: Rapid alternating movements normal in all extremities. Finger-to-nose and heel-to-shin performed accurately bilaterally. Gait and Station: Arises from chair without difficulty. Stance is normal. Gait demonstrates normal stride length and balance . Able to heel, toe and tandem walk without difficulty.  Reflexes: 1+ and symmetric. Toes downgoing.   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 Vascular risk factors of small PFO and hyperlipidemia only. 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 He also has mild musculoskeletal posterior neck  pain and tension headache which appears improved  PLAN: I had a long discussion with the patient regarding his remote cryptogenic stroke as well as muscle tension headaches and answered questions.  He seems to be doing well. Continue eliquis as per his cardiologist but consider stopping aspirin unless cardiologist promptly feel he needs both. I encouraged him to exercise regularly and lose some weight and eat a healthy diet. Maintain strict control of blood pressure with goal below 130/90 and lipids with LDL cholesterol goal below 70 mg percent. I encouraged him to do regular neck stretching exercises. No routine follow-up appointment with me is necessary but he may be referred back in the future as needed. Greater than 50% of time during this 30 minute visit was spent on counseling,explanation of diagnosis vertebral artery occlusion, brainstem stroke, dizziness, planning of further management, discussion with patient and family and coordination of care Delia Heady, MD  [facility] [address] [place], [place] [address]  Phone [phone] Fax [phone] Note: This document was prepared with digital dictation and possible smart phrase technology. Any transcriptional errors that result from this process are unintentional

## 2018-01-07 ENCOUNTER — Ambulatory Visit
Admit: 2018-01-07 | Discharge: 2018-01-08 | Payer: PRIVATE HEALTH INSURANCE | Attending: Cardiovascular Disease | Primary: Cardiovascular Disease

## 2018-01-07 DIAGNOSIS — E7800 Pure hypercholesterolemia, unspecified: Principal | ICD-10-CM

## 2018-01-07 MED ORDER — EZETIMIBE 10 MG TABLET
ORAL_TABLET | Freq: Every day | ORAL | 3 refills | 0 days | Status: CP
Start: 2018-01-07 — End: 2018-04-08

## 2018-01-07 NOTE — Unmapped (Signed)
Your ldl cholesterol is still elevated.  Please continue your meds and begin eze 10mg .

## 2018-01-09 NOTE — Unmapped (Signed)
Cardiology Clinic Note  ?  Visit Date: January 07, 2018  Requesting Provider: Donnie Mesa, MD   Primary Provider: Donnie Mesa, MD     Reason for Consult:   Follow up on High Lp(a) and lipid management    Assessment & Plan:  [name] is a 42 y.o. male with a hx of recent stroke on 04/11/2017 and high lp(a), here for management of risk factors.  ?  1. Hx of Stroke (04/11/2017):  Patient had a recent stroke in 04/11/2017.  During that stroke, multiple evaluations were conducted.  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 He has few arteriosclerotic risk factors: A. Lipoprotein  little a of 175mg /dL (no result available to [place] in Care Everywhere) B. Small PFO C. Family history of VTE, sister with DVT/PE at young age and MI at age 35.  No identifiable etiology currently of plaque, thrombosis, dissection, or congenital aplastic artery Hypercoagulable workup was negative.  Fibrinogen and PNH were negative.  His Jak-2 mutation was negative. Per pt report, telemetry was negative for afib. 2 week holter monitor was benign, no arrythmiia. TTE reported normal EF 55-60% and slight AI and small PFO. After literature review patient was started on Elliquis 5mg  BID for anticoagulation. Patient is compliant with the medication and denies any abnormal bleeding or bruising. Patient recently had a follow up with neurology on 12/16/17. Patient was evaluated and reassured during that visit and advised follow up as necessary.  - Continue Eliquis 5mg  BID  - Continue follow up with neurology as needed  - Advised risk factor management as below    2. HLD w/ High Lp(a):  Patient has a hx of high Lp(a) of 175 mg/dl. At the time of his stroke patients lipids were well managed with LDL of 57. Patient was started on Crestor 20mg  for protective effect. Attempted to qualify for PCSK9 but patient did not qualify, despite appeals. Since his stroke patient reports having put on weight and not exercising as previously. His lipid panel on 10/08/17 was found to have an elevated LDL of 106. Patient remains compliant with Crestor 20mg  and POCT lipid panel today shows LDL of 80 and total chol of 183.   - Continue Crestor 20mg   - Start Zetia 10mg  with a goal ldl of less than 70    3. Anxiety:  Patient has a hx of Anxiety which was treated with CBT. He continues to report experiencing some episodes of palpitations and chest pain which he attributes to his anxiety.   - Continue current management    Follow Up ??? 3 months    History of Present Illness:  Dakota Galvan is a 42 y.o. male with a hx of recent stroke on 04/11/2017 and high lp(a), here for management of risk factors. During the time of his stroke multiple evaluation were done to assess his risk factors. He was then found to have an elevated Lp(a). He was then started on Crestor 20mg  for protective effect despite his LDL at that time being well controlled. Attempts were made to qualify him for PCSK9 therapy given his hx of stroke at a young age and family history, to no avail. His POCT lipid panel today show an LDL of 80. He reports compliance with Crestor but does state that he has gained weight and not exercised post stroke. He denies any episodes of SOB, DOE, LEE, PND, orthopnea or  syncope. He reports occasional episodes of palpitations and chest pain that he attributes to his anxiety and have not progressed. He follows a healthy diet and has started exercising again. He had a recent follow up with Neurology where patient was reassured and advised to follow up as needed.  ?  Past Medical History:  Stroke    Past Surgical History:  No past surgical history on file.  ?  Medications:  Aspirin 81mg    apixaban (ELIQUIS) 5 mg Tab    desvenlafaxine (PRISTIQ) 50 MG 24 hr tablet   ezetimibe (ZETIA) 10 mg tablet   rosuvastatin (CRESTOR) 20 MG tablet       ?  Allergies: Penicillin ??? hives  Propofol - anaphylaxis  ?  Social History:  Social History   Social History   ??? Marital status: married   ? ? Spouse name: N/A   ??? Number of children: N/A   ??? Years of education: N/A   Occupational History   ??? Not on file.   Social History Main Topics   ??? Smoking status: Former Smoker; quit date 12/28/2015   ??? Smokeless tobacco: Never   ??? Alcohol use Yes, 2 drinks/day   ??? Drug use: None   ??? Sexual activity: Not on file   Other Topics Concern   ??? Not on file   Social History Narrative   ??? Consumes a healthy diet. Has restarted exercising post stroke.   ?  Family History:  Sister ??? MI (age 38)  Mother ??? DVTs, phlebitis  Maternal grandfather ??? MI (mid 91s)  Son - PKD (patient and wife are carriers)  ?  Review of Systems:  A 12-system review of systems was performed and was negative except as noted in the HPI.    Physical Exam:  VITAL SIGNS:  BP: 118/78  Pulse: 72  Height: 175.3cm  Weight: 88.9kg  BMI: 28.93kg  Gen: Pleasant man sitting comfortably on the chair.  HEENT: PERRL, EOMI, OP clear with MMM.   Neck: Supple, no JVD, no carotid bruits  Pulm: CTA bilaterally without wheezes or crackles, no increased work of breathing  CV: RRR, S1S2, no M/R/G  Abd: Soft, NT/ND, no masses or organomegaly, no bruits  Ext: No LE edema. 2+ radial, PT, and DP pulses bilaterally  Neuro: CN 2-12 grossly intact, intact motor and sensory, normal gait  ?  Pertinent Test Results:  Lab Review   POCT lipid panel (01/07/18)  - HDL - 77  - Triglycerides - 131  - LDL - 80  - Total Cholesterol - 183

## 2018-01-17 DIAGNOSIS — Z23 Encounter for immunization: Secondary | ICD-10-CM | POA: Diagnosis not present

## 2018-01-22 DIAGNOSIS — F4322 Adjustment disorder with anxiety: Secondary | ICD-10-CM | POA: Diagnosis not present

## 2018-01-28 ENCOUNTER — Encounter
Payer: BLUE CROSS/BLUE SHIELD | Attending: Physical Medicine & Rehabilitation | Admitting: Physical Medicine & Rehabilitation

## 2018-02-05 DIAGNOSIS — F4322 Adjustment disorder with anxiety: Secondary | ICD-10-CM | POA: Diagnosis not present

## 2018-02-19 DIAGNOSIS — R82998 Other abnormal findings in urine: Secondary | ICD-10-CM | POA: Diagnosis not present

## 2018-02-19 DIAGNOSIS — Z125 Encounter for screening for malignant neoplasm of prostate: Secondary | ICD-10-CM | POA: Diagnosis not present

## 2018-02-19 DIAGNOSIS — Z0000 Encounter for general adult medical examination without abnormal findings: Secondary | ICD-10-CM | POA: Diagnosis not present

## 2018-02-25 DIAGNOSIS — R030 Elevated blood-pressure reading, without diagnosis of hypertension: Secondary | ICD-10-CM | POA: Diagnosis not present

## 2018-02-25 DIAGNOSIS — Z0000 Encounter for general adult medical examination without abnormal findings: Secondary | ICD-10-CM | POA: Diagnosis not present

## 2018-02-25 DIAGNOSIS — Z87898 Personal history of other specified conditions: Secondary | ICD-10-CM | POA: Diagnosis not present

## 2018-02-25 DIAGNOSIS — Z1389 Encounter for screening for other disorder: Secondary | ICD-10-CM | POA: Diagnosis not present

## 2018-02-25 DIAGNOSIS — R779 Abnormality of plasma protein, unspecified: Secondary | ICD-10-CM | POA: Diagnosis not present

## 2018-02-25 DIAGNOSIS — E7849 Other hyperlipidemia: Secondary | ICD-10-CM | POA: Diagnosis not present

## 2018-03-05 DIAGNOSIS — F4322 Adjustment disorder with anxiety: Secondary | ICD-10-CM | POA: Diagnosis not present

## 2018-03-06 ENCOUNTER — Ambulatory Visit (INDEPENDENT_AMBULATORY_CARE_PROVIDER_SITE_OTHER): Payer: BLUE CROSS/BLUE SHIELD | Admitting: Pharmacist

## 2018-03-06 DIAGNOSIS — E782 Mixed hyperlipidemia: Secondary | ICD-10-CM

## 2018-03-06 MED ORDER — ROSUVASTATIN CALCIUM 40 MG PO TABS: 40.0000 mg | ORAL_TABLET | Freq: Every day | ORAL | 3 refills | Status: DC

## 2018-03-06 MED ORDER — EVOLOCUMAB 140 MG/ML ~~LOC~~ SOAJ: 1.0000 "pen " | SUBCUTANEOUS | 11 refills | Status: DC

## 2018-03-06 MED ORDER — ROSUVASTATIN CALCIUM 40 MG PO TABS: 20.0000 mg | ORAL_TABLET | Freq: Every day | ORAL | 3 refills | Status: DC

## 2018-03-06 NOTE — Progress Notes (Signed)
Patient ID: Jake Moore                 DOB: 08/16/1975                    MRN: [id number]     HPI: Jake Moore is a 42 y.o. male patient referred to lipid clinic by Dr. Shirlee Latch. PMH is significant for CVA (03/2017), HLD, anxiety, and ADD. Patient was referred for evaluation of PCSK9 inhibitor therapy based on his significant family history of premature cardiovascular disease and his history of a stroke (age 79). Patient is a personal friend of Dr. Shirlee Latch and is typically followed for cardiology with Dr. Lodema Hong at the [facility] in [place]. He has also seen Sallyanne Kuster, PharmD with [facility] lipid clinic, however was not successful with Repatha initiation.  Patient arrives in good spirits accompanied by his wife. Patient was referred to a [facility] hematologist by Dr. Shirlee Latch, who then referred him to the cardiologist that he sees currently.  Initial request for Repatha completed in January 2019 and after completing 2-tiers of appeals, patient was ultimately rejected. Per [facility] cardiologist note from June 2019, patient did not qualify for PCSK9i therapy because LDL <70 at the time and was determined to be not medically necessary. Repatha is the preferred PCSK9i on patient's insurance.   Patient is tolerating both rosuvastatin 20mg  and ezetimibe well. Patient is well informed about lipoprotein A, reports that he has heard that rosuvastatin may increase Lp(a) and is concerned about increasing rosuvastatin for this reason. Patient reported that he is interested in participating in clinical trial TQJ230 which studies reductions in Lp(a) in patients with prior history of stroke. Patient's wife asks if it is concerning that his Lp(a) levels have increased. Patient denies having an advanced lipid panel done. Since lipid panel with LDL <70 from earlier this year, patient's most recent lipid panel shows that LDL is now elevated >70. Patient attributes increase in LDL to weight gain since stroke.     Discussed that PCSK9 inhibitors can decrease Lp(a), but therapy will not decrease to normal levels. Additionally discussed pros and cons of increasing rosuvastatin to max dose. Patient's wife is concerned if they are doing "everything that they can" to prevent another event, given patient's own history and family history. Patient is willing to increase dose of rosuvastatin to 40mg  daily while waiting for status on Repatha prior authorization.   Current Medications: rosuvastatin 20 mg daily, ezetimibe 10 mg daily  Intolerances: N/A Risk Factors: prior stroke, family history of ASCVD LDL goal: <70  Diet: He has always been able to eat whatever he wants. He is now trying the Mediterranean diet.   Exercise: sees a Systems analyst, runs 2 miles; patient has gained a significant amount of weight after his stroke and has been unable to exercise like he used to (~40 lbs)  Family History:  mother: obesity, breast cancer, hepatitis C, alcoholism, PVD sister: MI age 73 (factor VIII), DVT/PE in college maternal grandfather: MI age 27 paternal grandfather: CAD s/p MI age 31s.   Social History: former smoker (quit 2014), drinks 1 beer/day   Labs: Lp(a) 385.2; Coronary calcium score (~1 year ago) 0 Lipid panel (02/19/18): TC 157, LDL 81, HDL 62, TGs 69  Past Medical History:  Diagnosis Date  . Anxiety   . CVA (cerebral vascular accident) [facility])     Current Outpatient Medications on File Prior to Visit  Medication Sig Dispense Refill  .  apixaban (ELIQUIS) 5 MG TABS tablet Take by mouth.    Marland Kitchen. aspirin EC 81 MG tablet Take 81 mg by mouth daily.    Marland Kitchen. desvenlafaxine (PRISTIQ) 50 MG 24 hr tablet Take 50 mg by mouth daily.    . rosuvastatin (CRESTOR) 10 MG tablet 20 mg.   10   No current facility-administered medications on file prior to visit.     Allergies  Allergen Reactions  . Penicillins Hives    Has patient had a PCN reaction causing immediate rash, facial/tongue/throat swelling, SOB or  lightheadedness with hypotension: YES Has patient had a PCN reaction causing severe rash involving mucus membranes or skin necrosis: NO Has patient had a PCN reaction that required hospitalizationNO Has patient had a PCN reaction occurring within the last 10 years: NO If all of the above answers are "NO", then may proceed with Cephalosporin use. Has patient had a PCN reaction causing immediate rash, facial/tongue/throat swelling, SOB or lightheadedness with hypotension: YES Has patient had a PCN reaction causing severe rash involving mucus membranes or skin necrosis: NO Has patient had a PCN reaction that required hospitalizationNO Has patient had a PCN reaction occurring within the last 10 years: NO If all of the above answers are "NO", then may proceed with Cephalosporin use.    Assessment/Plan:  1. Hyperlipidemia - Patient LDL now elevated above goal of 70mg /dL due to history of stroke despite current therapy with high intensity rosuvastatin + ezetimibe. Since it has been more than 6 months since the initial submission for a Repatha prior authorization, will resubmit with most recent lipid panel results. Will contact patient with status of prior authorization once we find out more information. Will also increase rosuvastatin to 40mg  daily to maximize oral lipid lowering therapy.  Patient seen with Lyanne Cooopali Sharma, PharmD Student  Medea Deines E. Cevin Rubinstein, PharmD, BCACP, CPP [facility] [address], [place], [place] [address] Phone: [phone]) [phone]; Fax: [phone]) [phone] 03/06/2018 12:24 PM   Addendum: Prior authorization for Repatha was approved immediately. Rx sent to local pharmacy. Will recheck lipids after pt has completed 3-4 Repatha injections. Will also plan to check advanced lipid panel to assess LDL particle # and ApoB.

## 2018-04-08 ENCOUNTER — Encounter
Admit: 2018-04-08 | Discharge: 2018-04-08 | Payer: PRIVATE HEALTH INSURANCE | Attending: Cardiovascular Disease | Primary: Cardiovascular Disease

## 2018-04-08 DIAGNOSIS — E7800 Pure hypercholesterolemia, unspecified: Principal | ICD-10-CM

## 2018-04-08 DIAGNOSIS — Z87891 Personal history of nicotine dependence: Secondary | ICD-10-CM | POA: Diagnosis not present

## 2018-04-08 DIAGNOSIS — F419 Anxiety disorder, unspecified: Secondary | ICD-10-CM | POA: Diagnosis not present

## 2018-04-08 DIAGNOSIS — Z7982 Long term (current) use of aspirin: Secondary | ICD-10-CM | POA: Diagnosis not present

## 2018-04-08 DIAGNOSIS — Z79899 Other long term (current) drug therapy: Secondary | ICD-10-CM | POA: Diagnosis not present

## 2018-04-08 DIAGNOSIS — Z880 Allergy status to penicillin: Secondary | ICD-10-CM | POA: Diagnosis not present

## 2018-04-08 DIAGNOSIS — Z8673 Personal history of transient ischemic attack (TIA), and cerebral infarction without residual deficits: Secondary | ICD-10-CM | POA: Diagnosis not present

## 2018-04-08 DIAGNOSIS — Z7901 Long term (current) use of anticoagulants: Secondary | ICD-10-CM | POA: Diagnosis not present

## 2018-04-08 DIAGNOSIS — Z6828 Body mass index (BMI) 28.0-28.9, adult: Secondary | ICD-10-CM | POA: Diagnosis not present

## 2018-04-08 DIAGNOSIS — E785 Hyperlipidemia, unspecified: Secondary | ICD-10-CM | POA: Diagnosis not present

## 2018-04-08 DIAGNOSIS — F4322 Adjustment disorder with anxiety: Secondary | ICD-10-CM | POA: Diagnosis not present

## 2018-04-08 NOTE — Unmapped (Addendum)
You are doing well with your cholesterol management and have begun repatha.   You have had an excellent response to pcsk9.

## 2018-04-13 NOTE — Unmapped (Signed)
Cardiology Clinic Note  ?    Requesting Provider: Donnie Mesa, MD   Primary Provider: Donnie Mesa, MD     Reason for Consult:   Follow up on High Lp(a) and lipid management,s/p cryptogenic stroke    Assessment & Plan:  Dakota Galvan is a 42 y.o. male with a hx of recent stroke on 04/11/2017 and high lp(a), here for management of risk factors.  ?  1. Hx of Stroke (04/11/2017):  Patient had a recent stroke in 04/11/2017.  During that stroke, multiple evaluations were conducted.  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 He has few arteriosclerotic risk factors: A. Lipoprotein  little a of 175mg /dL (no result available to [place] in Care Everywhere) B. Small PFO C. Family history of VTE, sister with DVT/PE at young age and MI at age 75.  No identifiable etiology currently of plaque, thrombosis, dissection, or congenital aplastic artery Hypercoagulable workup was negative.  Fibrinogen and PNH were negative.  His Jak-2 mutation was negative. Per pt report, telemetry was negative for afib. 2 week holter monitor was benign, no arrythmiia. TTE reported normal EF 55-60% and slight AI and small PFO. After literature review patient was started on Elliquis 5mg  BID for anticoagulation. Patient is compliant with the medication and denies any abnormal bleeding or bruising. Patient recently had a follow up with neurology on 12/16/17. Patient was evaluated and reassured during that visit and advised follow up as necessary.  No change, no new sx, pt remains physically active and good diet  - Continue Eliquis 5mg  BID  - Pt initiated repatha 140 by pcp, lipids today show ldl 30 tirg 93.  Continue  - Discussed send out for nmr profile, discouraged as see little value in this pt  - Continue follow up with neurology as needed  - Advised risk factor management as below    2. HLD w/ High Lp(a):  Patient has a hx of high Lp(a) of 175 mg/dl. At the time of his stroke patients lipids were well managed with LDL of 57. Patient was started on Crestor 20mg  for protective effect. Attempted to qualify for PCSK9 but patient did not qualify, despite appeals. Since his stroke patient reports having put on weight and not exercising as previously. His lipid panel on 10/08/17 was found to have an elevated LDL of 106. Patient remains compliant with Crestor 20mg  and POCT lipid panel shows LDL of 80 and total chol of 183. Now ldl 30 on added repatha  - Continue Crestor 20mg  for now at next visit would drop crestor to 10mg   - No need for Zetia 10mg  at this time    3. Anxiety:  Patient has a hx of Anxiety which was treated with CBT. He continues to report experiencing some episodes of palpitations and chest pain which he attributes to his anxiety.   - Continue current management    Follow Up ??? 6 months    History of Present Illness:  Dakota Galvan is a 42 y.o. male with a hx of recent stroke on 04/11/2017 and high lp(a), here for management of risk factors. During the time of his stroke multiple evaluation were done to assess his risk factors. He was then found to have an elevated Lp(a). He was then started on Crestor 20mg  for protective effect despite his LDL at that time being well controlled. Attempts were made to qualify him for PCSK9  therapy given his hx of stroke at a young age and family history, to no avail. His POCT lipid panel today show an LDL of 80. He reports compliance with Crestor but does state that he has gained weight and not exercised post stroke. He denies any episodes of SOB, DOE, LEE, PND, orthopnea or syncope. He reports occasional episodes of palpitations and chest pain that he attributes to his anxiety and have not progressed. He follows a healthy diet and has started exercising again. He had a recent follow up with Neurology where patient was reassured and advised to follow up as needed.  No new sx.  ?  Past Medical History:  Stroke    Past Surgical History:  No past surgical history on file.  ?  Medications:  Aspirin 81mg    apixaban (ELIQUIS) 5 mg Tab    desvenlafaxine (PRISTIQ) 50 MG 24 hr tablet   ezetimibe (ZETIA) 10 mg tablet   rosuvastatin (CRESTOR) 20 MG tablet       ?  Allergies:  Penicillin ??? hives  Propofol - anaphylaxis  ?  Social History:  Social History   Social History   ??? Marital status: married   ? ? Spouse name: N/A   ??? Number of children: N/A   ??? Years of education: N/A   Occupational History   ??? Not on file.   Social History Main Topics   ??? Smoking status: Former Smoker; quit date 12/28/2015   ??? Smokeless tobacco: Never   ??? Alcohol use Yes, 2 drinks/day   ??? Drug use: None   ??? Sexual activity: Not on file   Other Topics Concern   ??? Not on file   Social History Narrative   ??? Consumes a healthy diet. Has restarted exercising post stroke.   ?  Family History:  Sister ??? MI (age 80)  Mother ??? DVTs, phlebitis  Maternal grandfather ??? MI (mid 77s)  Son - PKD (patient and wife are carriers)  ?  Review of Systems:  A 12-system review of systems was performed and was negative except as noted in the HPI.    Physical Exam:  No change on pe from last exam  Vitals reviewed in epic, normal, stable  Gen: Pleasant man sitting comfortably on the chair.  HEENT: PERRL, EOMI, OP clear with MMM.   Neck: Supple, no JVD, no carotid bruits  Pulm: CTA bilaterally without wheezes or crackles, no increased work of breathing  CV: RRR, S1S2, no M/R/G  Abd: Soft, NT/ND, no masses or organomegaly, no bruits  Ext: No LE edema. 2+ radial, PT, and DP pulses bilaterally  Neuro: CN 2-12 grossly intact, intact motor and sensory, normal gait  ?  Pertinent Test Results:  Lab Review   POCT lipid panel (01/07/18)  - HDL - 77  - Triglycerides - 131  - LDL - 80  - Total Cholesterol - 183

## 2018-04-17 ENCOUNTER — Telehealth: Payer: Self-pay

## 2018-04-17 DIAGNOSIS — E785 Hyperlipidemia, unspecified: Secondary | ICD-10-CM

## 2018-04-17 NOTE — Telephone Encounter (Signed)
Called and left pt msg regarding needing 8 week post first dose of repatha labs. Ordered labs just not scheduled

## 2018-04-17 NOTE — Telephone Encounter (Signed)
Pt called back to schedule labs 1/15 @ 830am for 8 week post 1st repatha dose.

## 2018-04-29 DIAGNOSIS — F4322 Adjustment disorder with anxiety: Secondary | ICD-10-CM | POA: Diagnosis not present

## 2018-05-06 ENCOUNTER — Other Ambulatory Visit: Payer: BLUE CROSS/BLUE SHIELD

## 2018-05-08 ENCOUNTER — Other Ambulatory Visit: Payer: BLUE CROSS/BLUE SHIELD | Admitting: *Deleted

## 2018-05-08 DIAGNOSIS — E785 Hyperlipidemia, unspecified: Secondary | ICD-10-CM | POA: Diagnosis not present

## 2018-05-08 LAB — HEPATIC FUNCTION PANEL
ALBUMIN: 4.8 g/dL (ref 3.5–5.5)
ALT: 33 IU/L (ref 0–44)
AST: 30 IU/L (ref 0–40)
Alkaline Phosphatase: 61 IU/L (ref 39–117)
Bilirubin Total: 0.3 mg/dL (ref 0.0–1.2)
Bilirubin, Direct: 0.13 mg/dL (ref 0.00–0.40)
TOTAL PROTEIN: 6.9 g/dL (ref 6.0–8.5)

## 2018-05-08 LAB — LIPID PANEL
CHOL/HDL RATIO: 1.9 ratio (ref 0.0–5.0)
Cholesterol, Total: 117 mg/dL (ref 100–199)
HDL: 63 mg/dL (ref >39)
LDL Calculated: 40 mg/dL (ref 0–99)
Triglycerides: 71 mg/dL (ref 0–149)
VLDL CHOLESTEROL CAL: 14 mg/dL (ref 5–40)

## 2018-05-13 ENCOUNTER — Other Ambulatory Visit (HOSPITAL_COMMUNITY): Payer: Self-pay

## 2018-05-13 DIAGNOSIS — E785 Hyperlipidemia, unspecified: Secondary | ICD-10-CM

## 2018-05-14 ENCOUNTER — Other Ambulatory Visit (HOSPITAL_COMMUNITY): Payer: BLUE CROSS/BLUE SHIELD

## 2018-05-27 DIAGNOSIS — F4322 Adjustment disorder with anxiety: Secondary | ICD-10-CM | POA: Diagnosis not present

## 2018-07-09 ENCOUNTER — Telehealth: Payer: Self-pay | Admitting: *Deleted

## 2018-07-09 NOTE — Telephone Encounter (Addendum)
PATIENT SPOKE TO SCHEDULER APPOINTMENT CANCELLED     Primary Cardiologist:  No primary care provider on file.  LIPID CLINIC Patient contacted.  History reviewed.  No symptoms to suggest any unstable cardiac conditions.  Based on discussion, with current pandemic situation, we will be postponing this appointment for MR Tauzin.  If symptoms change, he has been instructed to contact our office.  Routing to C19 CANCEL pool for tracking (P CV DIV CV19 CANCEL).  Tobin Chad, RN  07/09/2018 11:52 AM         .

## 2018-07-09 NOTE — Telephone Encounter (Signed)
The patient left the office before the visit was finished. MESSAGE TO CALL BACK -- NEED POSTPONE  APPT 07/14/18 - LIPID CLINIC

## 2018-07-10 ENCOUNTER — Encounter

## 2018-07-13 NOTE — Telephone Encounter (Signed)
Pt already been rescheduled for 08/17/2018. Will remove out of the Covid 19 cancel pool.

## 2018-07-14 ENCOUNTER — Ambulatory Visit: Payer: BLUE CROSS/BLUE SHIELD | Admitting: Internal Medicine

## 2018-07-22 DIAGNOSIS — F4322 Adjustment disorder with anxiety: Secondary | ICD-10-CM | POA: Diagnosis not present

## 2018-08-05 DIAGNOSIS — F4322 Adjustment disorder with anxiety: Secondary | ICD-10-CM | POA: Diagnosis not present

## 2018-08-10 ENCOUNTER — Telehealth: Payer: Self-pay | Admitting: *Deleted

## 2018-08-10 ENCOUNTER — Encounter: Payer: Self-pay | Admitting: *Deleted

## 2018-08-10 NOTE — Telephone Encounter (Signed)
Virtual Visit Pre-Appointment Phone Call  Steps For Call:  1. Confirm consent - "In the setting of the current Covid19 crisis, you are scheduled for a (phone or video) visit with your provider on (date) at (time).  Just as we do with many in-office visits, in order for you to participate in this visit, we must obtain consent.  If you'd like, I can send this to your mychart (if signed up) or email for you to review.  Otherwise, I can obtain your verbal consent now.  All virtual visits are billed to your insurance company just like a normal visit would be.  By agreeing to a virtual visit, we'd like you to understand that the technology does not allow for your provider to perform an examination, and thus may limit your provider's ability to fully assess your condition. If your provider identifies any concerns that need to be evaluated in person, we will make arrangements to do so.  Finally, though the technology is pretty good, we cannot assure that it will always work on either your or our end, and in the setting of a video visit, we may have to convert it to a phone-only visit.  In either situation, we cannot ensure that we have a secure connection.  Are you willing to proceed?" STAFF: Did the patient verbally acknowledge consent to telehealth visit? Document YES/NO here: YES 2. Confirm the BEST phone number to call the day of the visit by including in appointment notes  3. Give patient instructions for WebEx/MyChart download to smartphone as below or Doximity/Doxy.me if video visit (depending on what platform provider is using)  4. Advise patient to be prepared with their blood pressure, heart rate, weight, any heart rhythm information, their current medicines, and a piece of paper and pen handy for any instructions they may receive the day of their visit  5. Inform patient they will receive a phone call 15 minutes prior to their appointment time (may be from unknown caller ID) so they should be  prepared to answer  6. Confirm that appointment type is correct in Epic appointment notes (VIDEO vs PHONE)     TELEPHONE CALL NOTE  Jake Moore has been deemed a candidate for a follow-up tele-health visit to limit community exposure during the Covid-19 pandemic. I spoke with the patient via phone to ensure availability of phone/video source, confirm preferred email & phone number, and discuss instructions and expectations.  I reminded Jake Moore to be prepared with any vital sign and/or heart rhythm information that could potentially be obtained via home monitoring, at the time of his visit. I reminded Jake Moore to expect a phone call at the time of his visit if his visit.  Raelyn Number, CMA 08/10/2018 2:35 PM   INSTRUCTIONS FOR DOWNLOADING THE WEBEX APP TO SMARTPHONE  - If Apple, ask patient to go to [employer] and type in WebEx in the search bar. Download [employer], the blue/green circle. If Android, go to [employer] and type in [employer] in the search bar. The app is free but as with any other app downloads, their phone may require them to verify saved payment information or Apple/Android password.  - The patient does NOT have to create an account. - On the day of the visit, the assist will walk the patient through joining the meeting with the meeting number/password.  INSTRUCTIONS FOR DOWNLOADING THE MYCHART APP TO SMARTPHONE  - The patient must first make  sure to have activated MyChart and know their login information - If Apple, go to [employer] and type in MyChart in the search bar and download the app. If Android, ask patient to go to [employer] and type in [place] in the search bar and download the app. The app is free but as with any other app downloads, their phone may require them to verify saved payment information or Apple/Android password.  - The patient will need to then log into the app with their MyChart username and  password, and select [facility] as their healthcare provider to link the account. When it is time for your visit, go to the MyChart app, find appointments, and click Begin Video Visit. Be sure to Select Allow for your device to access the Microphone and Camera for your visit. You will then be connected, and your provider will be with you shortly.  **If they have any issues connecting, or need assistance please contact MyChart service desk [phone])**  **If using a computer, in order to ensure the best quality for their visit they will need to use either of the following Internet Browsers: [employer], or Google Chrome**  IF USING DOXIMITY or DOXY.ME - The patient will receive a link just prior to their visit, either by text or email (to be determined day of appointment depending on if it's doxy.me or Doximity).     FULL LENGTH CONSENT FOR TELE-HEALTH VISIT   I hereby voluntarily request, consent and authorize [facility] and its employed or contracted physicians, physician assistants, nurse practitioners or other licensed health care professionals (the Practitioner), to provide me with telemedicine health care services (the Services") as deemed necessary by the treating Practitioner. I acknowledge and consent to receive the Services by the Practitioner via telemedicine. I understand that the telemedicine visit will involve communicating with the Practitioner through live audiovisual communication technology and the disclosure of certain medical information by electronic transmission. I acknowledge that I have been given the opportunity to request an in-person assessment or other available alternative prior to the telemedicine visit and am voluntarily participating in the telemedicine visit.  I understand that I have the right to withhold or withdraw my consent to the use of telemedicine in the course of my care at any time, without affecting my right to future care or  treatment, and that the Practitioner or I may terminate the telemedicine visit at any time. I understand that I have the right to inspect all information obtained and/or recorded in the course of the telemedicine visit and may receive copies of available information for a reasonable fee.  I understand that some of the potential risks of receiving the Services via telemedicine include:   Delay or interruption in medical evaluation due to technological equipment failure or disruption;  Information transmitted may not be sufficient (e.g. poor resolution of images) to allow for appropriate medical decision making by the Practitioner; and/or   In rare instances, security protocols could fail, causing a breach of personal health information.  Furthermore, I acknowledge that it is my responsibility to provide information about my medical history, conditions and care that is complete and accurate to the best of my ability. I acknowledge that Practitioner's advice, recommendations, and/or decision may be based on factors not within their control, such as incomplete or inaccurate data provided by me or distortions of diagnostic images or specimens that may result from electronic transmissions. I understand that the practice of medicine is not an exact  science and that Practitioner makes no warranties or guarantees regarding treatment outcomes. I acknowledge that I will receive a copy of this consent concurrently upon execution via email to the email address I last provided but may also request a printed copy by calling the office of [facility].    I understand that my insurance will be billed for this visit.   I have read or had this consent read to me.  I understand the contents of this consent, which adequately explains the benefits and risks of the Services being provided via telemedicine.   I have been provided ample opportunity to ask questions regarding this consent and the Services and have had my  questions answered to my satisfaction.  I give my informed consent for the services to be provided through the use of telemedicine in my medical care  By participating in this telemedicine visit I agree to the above.      Cardiac Questionnaire:    Since your last visit or hospitalization:    1. Have you been having new or worsening chest pain? NO   2. Have you been having new or worsening shortness of breath? NO 3. Have you been having new or worsening leg swelling, wt gain, or increase in abdominal girth (pants fitting more tightly)? NO   4. Have you had any passing out spells? NO    *A YES to any of these questions would result in the appointment being kept. *If all the answers to these questions are NO, we should indicate that given the current situation regarding the worldwide coronarvirus pandemic, at the recommendation of the CDC, we are looking to limit gatherings in our waiting area, and thus will reschedule their appointment beyond four weeks from today.   _____________   COVID-19 Pre-Screening Questions:   Do you currently have a fever? NO  Have you recently travelled on a cruise, internationally, or to [place], [place], [place], [place], [place], or [place] [employer])? NO  Have you been in contact with someone that is currently pending confirmation of Covid19 testing or has been confirmed to have the Covid19 virus? NO  Are you currently experiencing fatigue or cough? NO  Spoke with patient and he is ok with having a video visit with Dr. Rennis GoldenHilty August 17, 2018. We went over his medications, allergies, history, and pharmacy.

## 2018-08-17 ENCOUNTER — Encounter: Payer: Self-pay | Admitting: Internal Medicine

## 2018-08-17 ENCOUNTER — Telehealth (INDEPENDENT_AMBULATORY_CARE_PROVIDER_SITE_OTHER): Payer: BLUE CROSS/BLUE SHIELD | Admitting: Internal Medicine

## 2018-08-17 VITALS — BP 143/94

## 2018-08-17 DIAGNOSIS — Z8673 Personal history of transient ischemic attack (TIA), and cerebral infarction without residual deficits: Secondary | ICD-10-CM | POA: Diagnosis not present

## 2018-08-17 DIAGNOSIS — E782 Mixed hyperlipidemia: Secondary | ICD-10-CM

## 2018-08-17 DIAGNOSIS — E7841 Elevated Lipoprotein(a): Secondary | ICD-10-CM | POA: Diagnosis not present

## 2018-08-17 NOTE — Patient Instructions (Signed)
Medication Instructions:  Your Physician recommend you continue on your current medication as directed.    If you need a refill on your cardiac medications before your next appointment, please call your pharmacy.   Lab work: None  Testing/Procedures: None  Follow-Up: At [facility], you and your health needs are our priority.  As part of our continuing mission to provide you with exceptional heart care, we have created designated Provider Care Teams.  These Care Teams include your primary Cardiologist (physician) and Advanced Practice Providers (APPs -  Physician Assistants and Nurse Practitioners) who all work together to provide you with the care you need, when you need it. You will need a follow up appointment in 6 months.  Please call our office 2 months in advance to schedule this appointment.  You may see Dr. Hilty or one of the following Advanced Practice Providers on your designated Care Team: Hao Meng, PA-C . Angela Duke, PA-C     

## 2018-08-20 ENCOUNTER — Encounter: Payer: Self-pay | Admitting: Internal Medicine

## 2018-08-20 NOTE — Progress Notes (Signed)
Virtual Visit via Video Note   This visit type was conducted due to national recommendations for restrictions regarding the COVID-19 Pandemic (e.g. social distancing) in an effort to limit this patient's exposure and mitigate transmission in our community.  Due to his co-morbid illnesses, this patient is at least at moderate risk for complications without adequate follow up.  This format is felt to be most appropriate for this patient at this time.  All issues noted in this document were discussed and addressed.  A limited physical exam was performed with this format.  Please refer to the patient's chart for his consent to telehealth for [facility].   Evaluation Performed:  Doximity video visit  Date:  08/20/2018   ID:  Jake Moore, DOB 14-Oct-1975, MRN [id number]  Patient Location:  [address] [place] [address]  Provider location:   [address] [place], [place] [address]  PCP:  Martha Clan, MD  Cardiologist:  No primary care provider on file. Electrophysiologist:  None   Chief Complaint:  New lipid clinic patient  History of Present Illness:    Jake Moore is a 43 y.o. male who presents via audio/video conferencing for a telehealth visit today.  Jake Moore was seen today for virtual visit.  He is a patient of Dr. Clelia Croft and has had previous work-up by my partners in cardiology including Dr. Gala Romney, Dr. Shirlee Latch (who he is friends with) and even communications with Dr. Bonnee Quin.  Jake Moore unfortunately has had a TIA/stroke which is unusual given his young age.  Family history is significant for a sister who has a hypercoagulable disorder and also I was found to have an elevated LP(a).  This led to testing for LP(a) which was significantly elevated at 385 by lab work in November 2019.  Based on this he has been on aggressive lipid management.  Currently taking rosuvastatin 40 mg daily.  He was also started on Repatha as there is evidence for  LP(a) lowering with this medication.  Additionally he takes Eliquis 5 mg twice daily for stroke prevention.  During his work-up he underwent a TEE which showed a small PFO.  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  He is additionally listed as taking aspirin 81 mg daily.  Discussion with his neurologist at [facility] felt that he may do fine with just aspirin alone.  He sought out second opinions and was seen by both neurology and in the lipid clinic by Dr. Mariane Baumgarten at [facility].  Dr. Lodema Hong felt that his lipids were appropriately treated for what we have currently available.  We did discuss at some length today about several enrolling clinical trials including an antisense oligonucleotide to LP(a) which is currently in phase 3 clinical trials.  Unfortunately there are no research sites in [place].  The patient does not have symptoms concerning for COVID-19 infection (fever, chills, cough, or new SHORTNESS OF BREATH).    Prior CV studies:   The following studies were reviewed today:  Prior stroke work-up including echocardiogram, TEE, lab work and scanned lab work from his PCP  PMHx:  Past Medical History:  Diagnosis Date   Anxiety    CVA (cerebral vascular accident) [facility])     Past Surgical History:  Procedure Laterality Date   TEE WITHOUT CARDIOVERSION N/A 04/14/2017   Procedure:  TRANSESOPHAGEAL ECHOCARDIOGRAM (TEE);  Surgeon: Dolores PattyBensimhon, Daniel R, MD;  [facility];  Service: Cardiovascular;  Laterality: N/A;    FAMHx:  Family History  Problem Relation Age of Onset   Other Sister        hypercoagulable state with SCAD   CAD Maternal Grandfather     SOCHx:   reports that he quit smoking about 2 years ago. His smoking use included cigarettes. He quit after 20.00 years of use. He has quit using  smokeless tobacco.  His smokeless tobacco use included chew. He reports current alcohol use of about 10.0 standard drinks of alcohol per week. He reports that he does not use drugs.  ALLERGIES:  Allergies  Allergen Reactions   Penicillins Hives    Has patient had a PCN reaction causing immediate rash, facial/tongue/throat swelling, SOB or lightheadedness with hypotension: YES Has patient had a PCN reaction causing severe rash involving mucus membranes or skin necrosis: NO Has patient had a PCN reaction that required hospitalizationNO Has patient had a PCN reaction occurring within the last 10 years: NO If all of the above answers are "NO", then may proceed with Cephalosporin use. Has patient had a PCN reaction causing immediate rash, facial/tongue/throat swelling, SOB or lightheadedness with hypotension: YES Has patient had a PCN reaction causing severe rash involving mucus membranes or skin necrosis: NO Has patient had a PCN reaction that required hospitalizationNO Has patient had a PCN reaction occurring within the last 10 years: NO If all of the above answers are "NO", then may proceed with Cephalosporin use.    MEDS:  Current Meds  Medication Sig   apixaban (ELIQUIS) 5 MG TABS tablet Take by mouth.   aspirin EC 81 MG tablet Take 81 mg by mouth daily.   desvenlafaxine (PRISTIQ) 50 MG 24 hr tablet Take 50 mg by mouth daily.   Evolocumab (REPATHA SURECLICK) 140 MG/ML SOAJ Inject 1 pen into the skin every 14 (fourteen) days.   rosuvastatin (CRESTOR) 40 MG tablet Take 1 tablet (40 mg total) by mouth daily. (Patient taking differently: Take 20 mg by mouth daily. )     ROS: Pertinent items noted in HPI and remainder of comprehensive ROS otherwise negative.  Labs/Other Tests and Data Reviewed:    Recent Labs: 05/08/2018: ALT 33   Recent Lipid Panel Lab Results  Component Value Date/Time   CHOL 117 05/08/2018 08:15 AM   TRIG 71 05/08/2018 08:15 AM   HDL 63 05/08/2018 08:15  AM   CHOLHDL 1.9 05/08/2018 08:15 AM   CHOLHDL 2.0 04/12/2017 02:54 AM   LDLCALC 40 05/08/2018 08:15 AM    Wt Readings from Last 3 Encounters:  12/16/17 196 lb 9.6 oz (89.2 kg)  07/31/17 182 lb 3.2 oz (82.6 kg)  06/18/17 179 lb 9.6 oz (81.5 kg)     Exam:    Vital Signs:  BP (!) 143/94    General appearance: alert and no distress Lungs: No audible wheezing or visible respiratory difficulty Extremities: extremities normal, atraumatic, no cyanosis or edema Neurologic: Mental status: Alert, oriented, thought content appropriate Psych: Pleasant  ASSESSMENT & PLAN:    1. History of stroke 2. Small PFO 3. 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 Dyslipidemia  Jake Moore had a history of stroke and does have small PFO.  Although there was some concern about vertebral dissection, I think it is also feasible that he had a venous thrombus associated with high LPA (a prothrombotic lipid), which could have served as a nidus for paradoxical  embolus.  Although his PFO is small, we do have some data to consider closure of PFO and reducing stroke risk.  I think this is worthwhile to consider.  The big question, is as to whether or not he would be able to come off of anticoagulation at some point.  There are few guidelines as to whether or not he should be anticoagulated beyond aspirin, however he electively chose to take Eliquis.  If we can successfully lower LPA to "normal levels", at some point, this would certainly reduce his risk of stroke and he may be able to be on aspirin monotherapy.  For now I think he is adequately protected however we must keep in mind that long-term anticoagulation does increase the risk of bleeding sequelae.  I agree with his current management and would recommend a repeat lipid profile in about 6 months.  I will maintain him and our patient database of LP(a) and keep him informed about any new treatment information which may become available.  COVID-19 Education: The signs and  symptoms of COVID-19 were discussed with the patient and how to seek care for testing (follow up with PCP or arrange E-visit).  The importance of social distancing was discussed today.  Patient Risk:   After full review of this patients clinical status, I feel that they are at least moderate risk at this time.  Time:   Today, I have spent 25 minutes with the patient with telehealth technology discussing LP(a), dyslipidemia, stroke, dietary recommendations.     Medication Adjustments/Labs and Tests Ordered: Current medicines are reviewed at length with the patient today.  Concerns regarding medicines are outlined above.   Tests Ordered: No orders of the defined types were placed in this encounter.   Medication Changes: No orders of the defined types were placed in this encounter.   Disposition:  in 6 month(s)  Chrystie Nose, MD, [facility], FACP  [place]   [facility]  Medical Director of the Advanced Lipid Disorders &  Cardiovascular Risk Reduction Clinic Diplomate of the American Board of Clinical Lipidology Attending Cardiologist  Direct Dial: [phone]   Fax: [phone]  Website:  [email or web address]  Chrystie Nose, MD  08/20/2018 12:19 PM

## 2018-10-27 ENCOUNTER — Other Ambulatory Visit: Payer: BLUE CROSS/BLUE SHIELD

## 2018-10-27 ENCOUNTER — Telehealth: Payer: Self-pay | Admitting: Internal Medicine

## 2018-10-27 DIAGNOSIS — R6889 Other general symptoms and signs: Secondary | ICD-10-CM | POA: Diagnosis not present

## 2018-10-27 DIAGNOSIS — Z20822 Contact with and (suspected) exposure to covid-19: Secondary | ICD-10-CM

## 2018-10-27 NOTE — Telephone Encounter (Signed)
Pt scheduled for covid testing today at [facility]. Requested by Dr. Johny Sax  Pt with fever, possible exposure  CB# [phone] Practice # [phone] Fax [phone]  Testing process reviewed, wear mask, stay in car; verbalizes understanding.

## 2018-10-31 LAB — NOVEL CORONAVIRUS, NAA: SARS-CoV-2, NAA: NOT DETECTED

## 2018-11-18 ENCOUNTER — Encounter
Admit: 2018-11-18 | Discharge: 2018-11-19 | Payer: PRIVATE HEALTH INSURANCE | Attending: Cardiovascular Disease | Primary: Cardiovascular Disease

## 2018-11-18 DIAGNOSIS — Z8673 Personal history of transient ischemic attack (TIA), and cerebral infarction without residual deficits: Secondary | ICD-10-CM | POA: Diagnosis not present

## 2018-11-18 DIAGNOSIS — Z7901 Long term (current) use of anticoagulants: Secondary | ICD-10-CM | POA: Diagnosis not present

## 2018-11-18 DIAGNOSIS — F419 Anxiety disorder, unspecified: Secondary | ICD-10-CM | POA: Diagnosis not present

## 2018-11-18 MED ORDER — ROSUVASTATIN 10 MG TABLET
ORAL_TABLET | Freq: Every day | ORAL | 6 refills | 30.00000 days | Status: CP
Start: 2018-11-18 — End: 2018-12-18

## 2018-11-18 NOTE — Unmapped (Signed)
You have done well on you repatha and crestor 20mg .  You may reduce your crestor to 10mg 

## 2018-11-18 NOTE — Unmapped (Signed)
I spent minutes on the video with the patient. I spent an additional 15 minutes on pre- and post-visit activities.     The patient was physically located in [place] or a state in which I am permitted to provide care. The patient and/or parent/guardian understood that s/he may incur co-pays and cost sharing, and agreed to the telemedicine visit. The visit was reasonable and appropriate under the circumstances given the patient's presentation at the time.    The patient and/or parent/guardian has been advised of the potential risks and limitations of this mode of treatment (including, but not limited to, the absence of in-person examination) and has agreed to be treated using telemedicine. The patient's/patient's family's questions regarding telemedicine have been answered.     If the visit was completed in an ambulatory setting, the patient and/or parent/guardian has also been advised to contact their provider???s office for worsening conditions, and seek emergency medical treatment and/or call 911 if the patient deems either necessary.Cardiology Clinic Note  ?    Requesting Provider: Donnie Mesa, MD   Primary Provider: Donnie Mesa, MD     Reason for Consult:   Follow up on High Lp(a) and lipid management,s/p cryptogenic stroke    Assessment & Plan:  Dakota Galvan is a 43 y.o. male with a hx of recent stroke on 04/11/2017 and high lp(a), here for management of risk factors.  ?  1. Hx of Stroke (04/11/2017):  Patient had a recent stroke in 04/11/2017.  During that stroke, multiple evaluations were conducted.  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 He has few arteriosclerotic risk factors: A. Lipoprotein  little a of 175mg /dL (no result available to [place] in Care Everywhere) B. Small PFO C. Family history of VTE, sister with DVT/PE at young age and MI at age 16.  No identifiable etiology currently of plaque, thrombosis, dissection, or congenital aplastic artery Hypercoagulable workup was negative.  Fibrinogen and PNH were negative.  His Jak-2 mutation was negative. Per pt report, telemetry was negative for afib. 2 week holter monitor was benign, no arrythmiia. TTE reported normal EF 55-60% and slight AI and small PFO. After literature review patient was started on Elliquis 5mg  BID for anticoagulation. Patient is compliant with the medication and denies any abnormal bleeding or bruising. Patient recently had a follow up with neurology on 12/16/17. Patient was evaluated and reassured during that visit and advised follow up as necessary.  No change, no new sx, pt remains physically active and good diet  - Continue Eliquis 5mg  BID  - Pt initiated repatha 140 by pcp, lipids today show ldl 30 tirg 93.  Continue  - Discussed send out for nmr profile, discouraged as see little value in this pt  - Continue follow up with neurology as needed  - Advised risk factor management as below  - No change, no new sx, pt is isolating in covid pandemic but exercising well and no history of cp, doe, pnd, orthop or dizziness    2. HLD w/ High Lp(a):  Patient has a hx of high Lp(a) of 175 mg/dl. At the time of his stroke patients lipids were well managed with LDL of 57. Patient was started on Crestor 20mg  for protective effect. Attempted to qualify for PCSK9 but patient did not qualify, despite appeals. Since his stroke patient reports having put on weight and not exercising as previously. His lipid  panel on 10/08/17 was found to have an elevated LDL of 106. Patient remains compliant with Crestor 20mg  and POCT lipid panel shows LDL of 80 and total chol of 183. Now ldl 30 on added repatha  - drop crestor to 10mg   - No need for Zetia 10mg  at this time  - Repeat lipids and lp(a) next visit    3. Anxiety:  Patient has a hx of Anxiety which was treated with CBT. He continues to report experiencing some episodes of palpitations and chest pain which he attributes to his anxiety.   - Continue current management    Follow Up ??? 6 months    History of Present Illness:  Dakota Galvan is a 43 y.o. male with a hx of recent stroke on 04/11/2017 and high lp(a), here for management of risk factors. During the time of his stroke multiple evaluation were done to assess his risk factors. He was then found to have an elevated Lp(a). He was then started on Crestor 20mg  for protective effect despite his LDL at that time being well controlled. Attempts were made to qualify him for PCSK9 therapy given his hx of stroke at a young age and family history, to no avail. His POCT lipid panel today show an LDL of 80. He reports compliance with Crestor but does state that he has gained weight and not exercised post stroke. He denies any episodes of SOB, DOE, LEE, PND, orthopnea or syncope. He reports occasional episodes of palpitations and chest pain that he attributes to his anxiety and have not progressed. He follows a healthy diet and has started exercising again. He had a past follow up with Neurology where patient was reassured and advised to follow up as needed.  No new sx.  No change  ?  Past Medical History:  Stroke    Past Surgical History:  No past surgical history on file.  ?  Medications:  Aspirin 81mg    apixaban (ELIQUIS) 5 mg Tab    desvenlafaxine (PRISTIQ) 50 MG 24 hr tablet   ezetimibe (ZETIA) 10 mg tablet   rosuvastatin (CRESTOR) 20 MG tablet       ?  Allergies:  Penicillin ??? hives  Propofol - anaphylaxis  ?  Social History:  Social History   Social History   ??? Marital status: married   ? ? Spouse name: N/A   ??? Number of children: N/A   ??? Years of education: N/A   Occupational History   ??? Not on file.   Social History Main Topics   ??? Smoking status: Former Smoker; quit date 12/28/2015   ??? Smokeless tobacco: Never   ??? Alcohol use Yes, 2 drinks/day   ??? Drug use: None   ??? Sexual activity: Not on file   Other Topics Concern   ??? Not on file   Social History Narrative   ??? Consumes a healthy diet. Has restarted exercising post stroke.   ?  Family History:  Sister ??? MI (age 89)  Mother ??? DVTs, phlebitis  Maternal grandfather ??? MI (mid 18s)  Son - PKD (patient and wife are carriers)  ?  Review of Systems:  A 12-system review of systems was performed and was negative except as noted in the HPI. No change      Pertinent Test Results:  Lab Review   POCT lipid panel (01/07/18)  - HDL - 77  - Triglycerides - 131  - LDL - 80  - Total Cholesterol - 183

## 2018-11-25 IMAGING — CT CT HEAD W/O CM
3 series · 15 of 47 positions shown, 18 images · non-contrast
Comparison: Brain MRI 04/12/2017

CLINICAL DATA: Headache and nystagmus. Recent small cerebellar
infarct.

EXAM:
CT HEAD WITHOUT CONTRAST
TECHNIQUE: Contiguous axial images were obtained from the base of the skull
through the vertex without intravenous contrast.

[Series 3: head 5.0 h30s · axial · 0.44mm/px · z∈[-174,-34]mm · 9 of 34 slices shown, 12 images]
[im 3/34  brain]
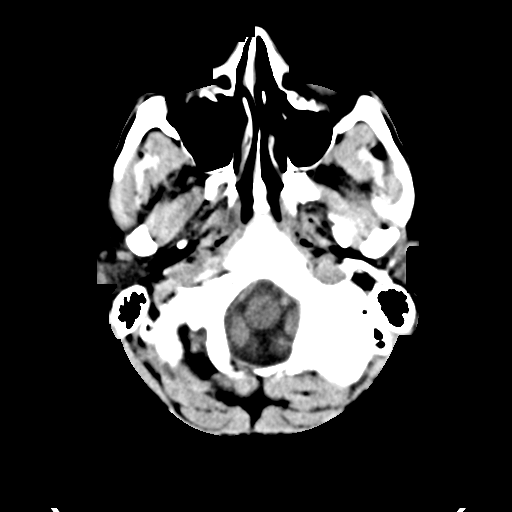
[im 3/34  bone]
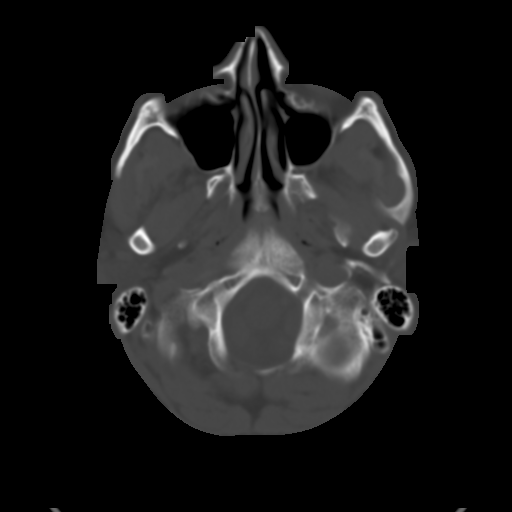
[im 6/34  brain]
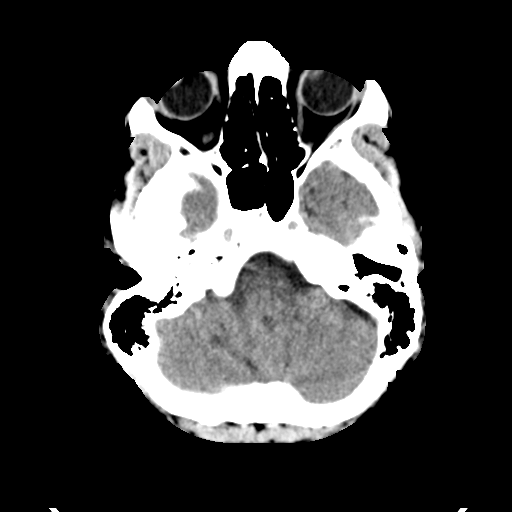
[im 10/34  brain]
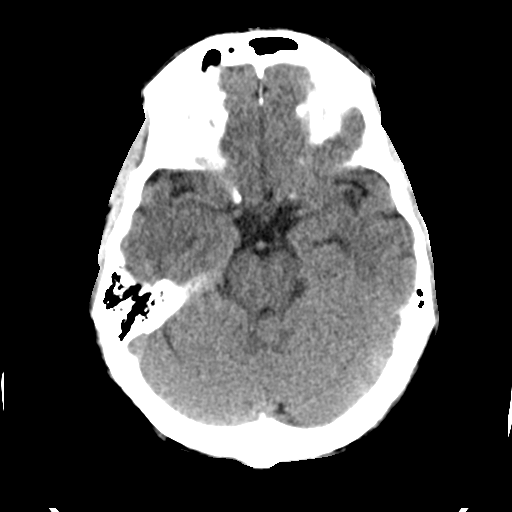
[im 13/34  brain]
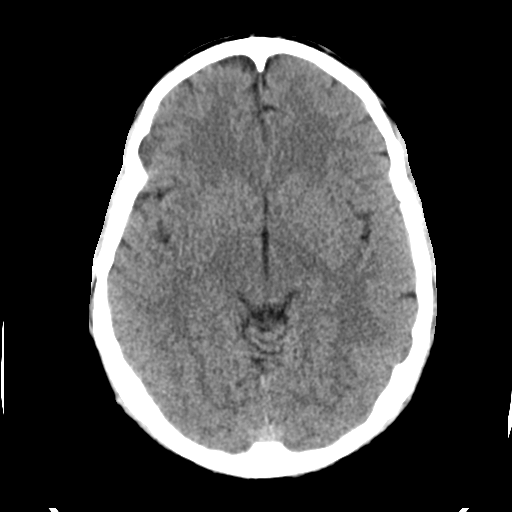
[im 18/34  brain]
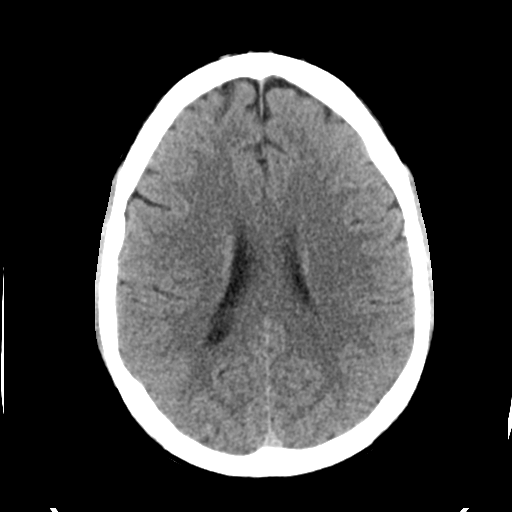
[im 18/34  bone]
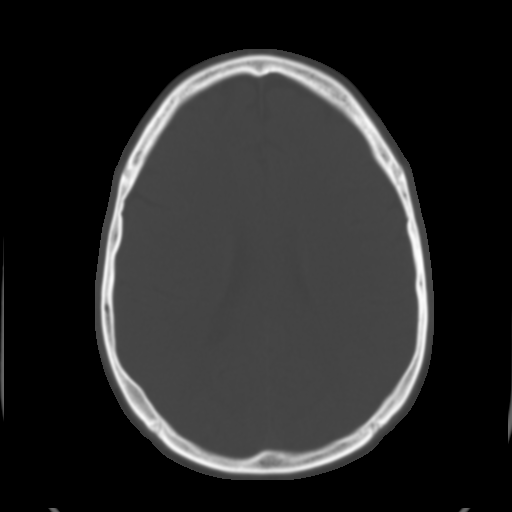
[im 21/34  brain]
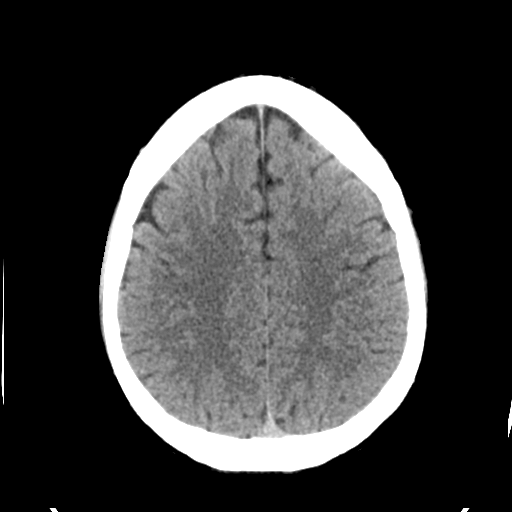
[im 24/34  brain]
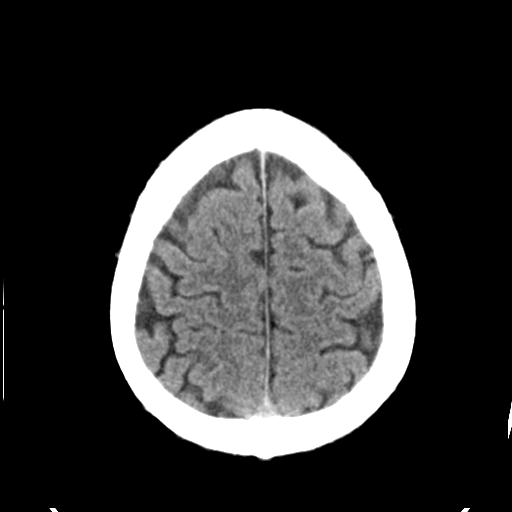
[im 28/34  brain]
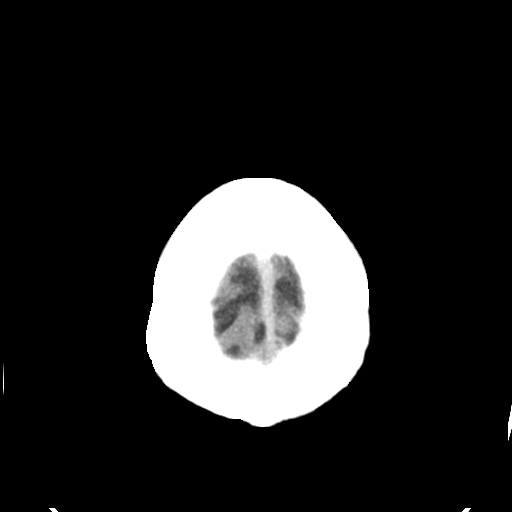
[im 31/34  brain]
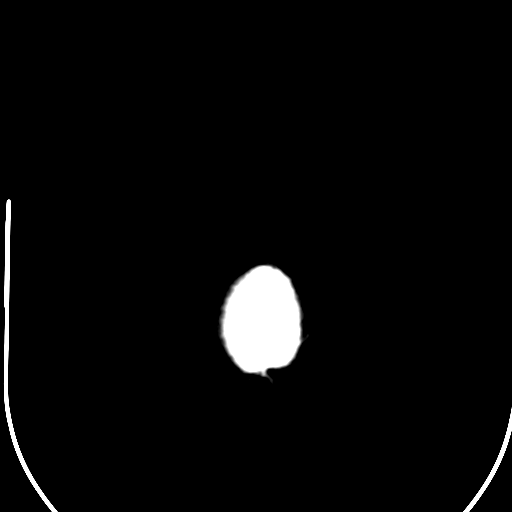
[im 31/34  bone]
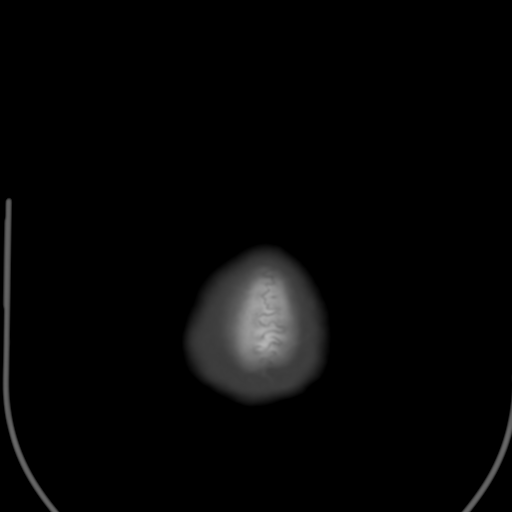

[Series 5: head 3.0 mpr cor · coronal · 0.30mm/px · 3 of 65 slices shown]
[im 22/65  brain]
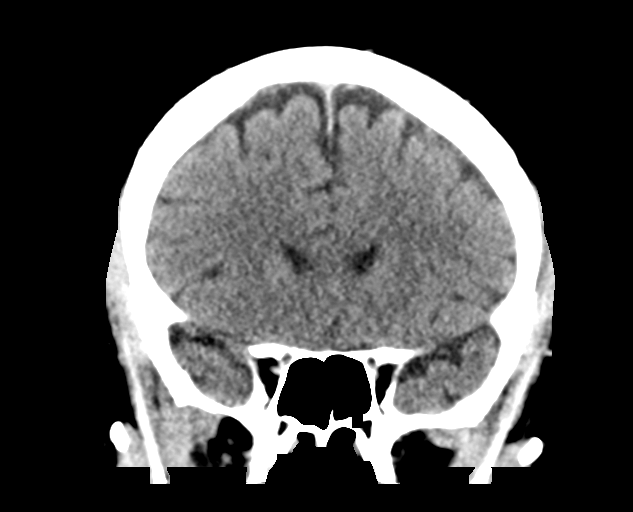
[im 29/65  brain]
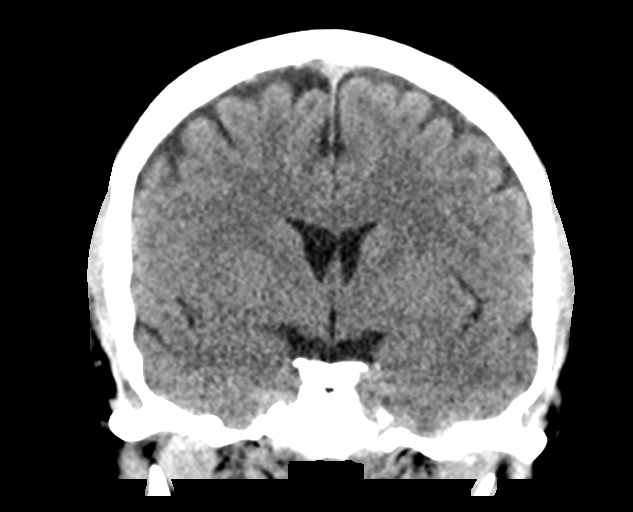
[im 36/65  brain]
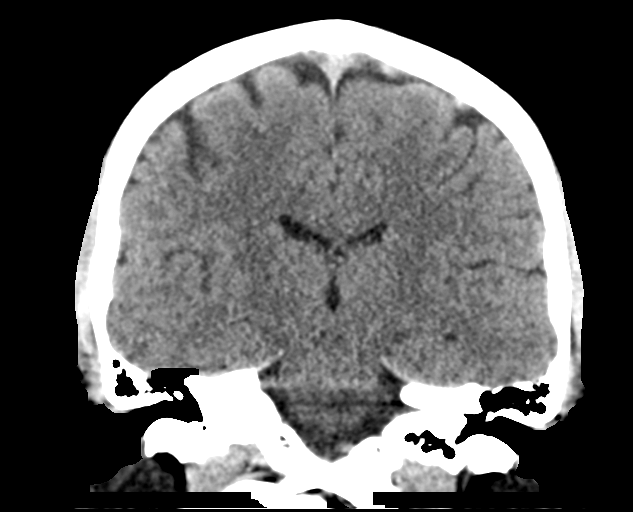

[Series 6: head 3.0 mpr sag · sagittal · 0.35mm/px · 3 of 54 slices shown]
[im 18/54  brain]
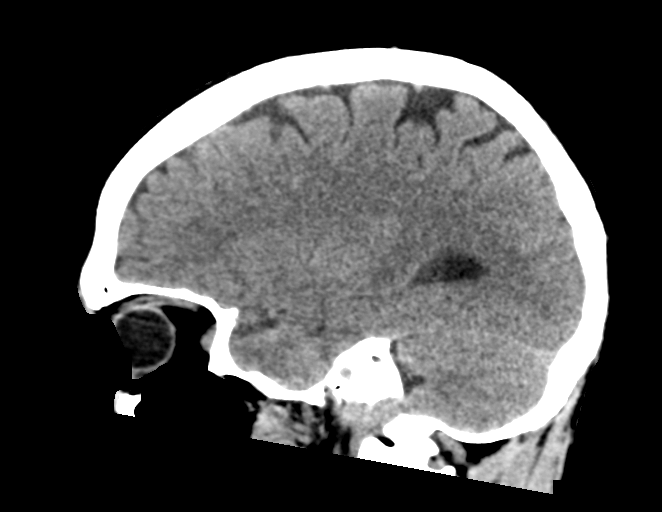
[im 27/54  brain]
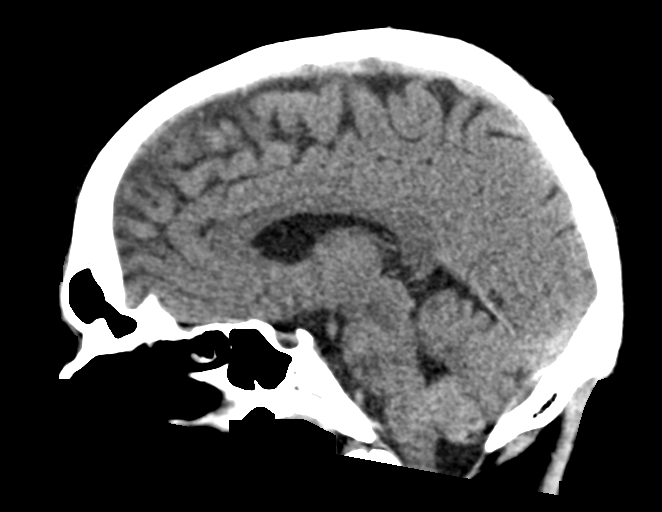
[im 36/54  brain]
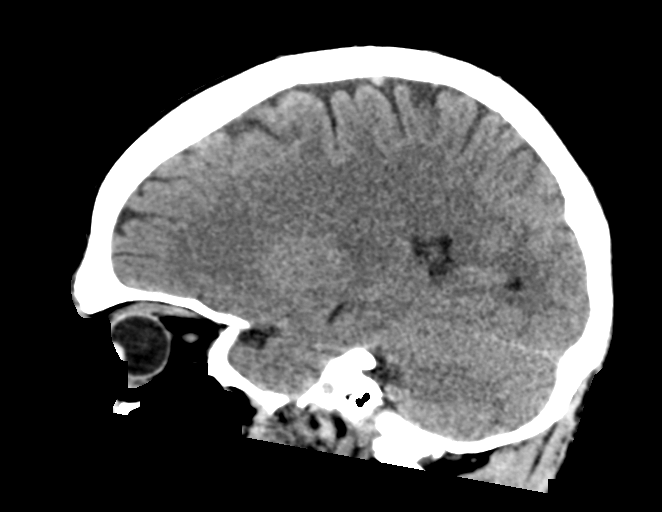

[15 of 47 positions shown; findings below may reference images not displayed]

FINDINGS: Brain: No mass lesion, intraparenchymal hemorrhage or extra-axial
collection. Focal area of hypoattenuation in the right cerebellum
corresponds to the infarct seen on MRI. Brain parenchyma and
CSF-containing spaces are normal for age.

Vascular: No hyperdense vessel or unexpected calcification.

Skull: Normal visualized skull base, calvarium and extracranial soft
tissues.

Sinuses/Orbits: No sinus fluid levels or advanced mucosal
thickening. No mastoid effusion. Normal orbits.
IMPRESSION: 1. Expected appearance of small right cerebellar infarct, without
hemorrhage. No mass effect.
2. Otherwise normal brain.

## 2019-02-17 ENCOUNTER — Other Ambulatory Visit: Payer: Self-pay

## 2019-02-17 DIAGNOSIS — Z20822 Contact with and (suspected) exposure to covid-19: Secondary | ICD-10-CM

## 2019-02-18 LAB — NOVEL CORONAVIRUS, NAA: SARS-CoV-2, NAA: NOT DETECTED

## 2019-02-20 DIAGNOSIS — Z20828 Contact with and (suspected) exposure to other viral communicable diseases: Secondary | ICD-10-CM | POA: Diagnosis not present

## 2019-02-25 DIAGNOSIS — E038 Other specified hypothyroidism: Secondary | ICD-10-CM | POA: Diagnosis not present

## 2019-02-25 DIAGNOSIS — Z0000 Encounter for general adult medical examination without abnormal findings: Secondary | ICD-10-CM | POA: Diagnosis not present

## 2019-02-25 DIAGNOSIS — E785 Hyperlipidemia, unspecified: Secondary | ICD-10-CM | POA: Diagnosis not present

## 2019-02-25 DIAGNOSIS — Z125 Encounter for screening for malignant neoplasm of prostate: Secondary | ICD-10-CM | POA: Diagnosis not present

## 2019-02-25 DIAGNOSIS — E7849 Other hyperlipidemia: Secondary | ICD-10-CM | POA: Diagnosis not present

## 2019-02-26 ENCOUNTER — Other Ambulatory Visit: Payer: Self-pay

## 2019-02-26 DIAGNOSIS — Z20822 Contact with and (suspected) exposure to covid-19: Secondary | ICD-10-CM

## 2019-02-27 LAB — NOVEL CORONAVIRUS, NAA: SARS-CoV-2, NAA: NOT DETECTED

## 2019-03-04 DIAGNOSIS — Z1331 Encounter for screening for depression: Secondary | ICD-10-CM | POA: Diagnosis not present

## 2019-03-04 DIAGNOSIS — Z87898 Personal history of other specified conditions: Secondary | ICD-10-CM | POA: Diagnosis not present

## 2019-03-04 DIAGNOSIS — R779 Abnormality of plasma protein, unspecified: Secondary | ICD-10-CM | POA: Diagnosis not present

## 2019-03-04 DIAGNOSIS — R030 Elevated blood-pressure reading, without diagnosis of hypertension: Secondary | ICD-10-CM | POA: Diagnosis not present

## 2019-03-04 DIAGNOSIS — E785 Hyperlipidemia, unspecified: Secondary | ICD-10-CM | POA: Diagnosis not present

## 2019-03-04 DIAGNOSIS — Z0000 Encounter for general adult medical examination without abnormal findings: Secondary | ICD-10-CM | POA: Diagnosis not present

## 2019-03-12 ENCOUNTER — Other Ambulatory Visit: Payer: Self-pay | Admitting: Cardiology

## 2019-03-15 ENCOUNTER — Telehealth: Payer: Self-pay | Admitting: Pharmacist

## 2019-03-15 NOTE — Telephone Encounter (Signed)
Pharmacist from [facility] called stating patient ID # changed as of Nov 1. Plan is still [employer] but he has a new ID number therefore needs a new PA. 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

## 2019-03-16 NOTE — Telephone Encounter (Signed)
PA for Repatha approved through 03/13/2020

## 2019-03-30 DIAGNOSIS — Z20828 Contact with and (suspected) exposure to other viral communicable diseases: Secondary | ICD-10-CM | POA: Diagnosis not present

## 2019-04-02 DIAGNOSIS — J069 Acute upper respiratory infection, unspecified: Secondary | ICD-10-CM | POA: Diagnosis not present

## 2019-04-02 DIAGNOSIS — Z20818 Contact with and (suspected) exposure to other bacterial communicable diseases: Secondary | ICD-10-CM | POA: Diagnosis not present

## 2019-04-08 DIAGNOSIS — Z20828 Contact with and (suspected) exposure to other viral communicable diseases: Secondary | ICD-10-CM | POA: Diagnosis not present

## 2019-04-12 ENCOUNTER — Other Ambulatory Visit: Payer: Self-pay

## 2019-04-12 ENCOUNTER — Encounter: Payer: Self-pay | Admitting: Neurology

## 2019-04-12 ENCOUNTER — Ambulatory Visit (INDEPENDENT_AMBULATORY_CARE_PROVIDER_SITE_OTHER): Payer: BC Managed Care – PPO | Admitting: Neurology

## 2019-04-12 VITALS — BP 134/88 | HR 68 | Ht 70.0 in | Wt 209.0 lb

## 2019-04-12 DIAGNOSIS — I639 Cerebral infarction, unspecified: Secondary | ICD-10-CM

## 2019-04-12 DIAGNOSIS — Z8673 Personal history of transient ischemic attack (TIA), and cerebral infarction without residual deficits: Secondary | ICD-10-CM

## 2019-04-12 DIAGNOSIS — Q2112 Patent foramen ovale: Secondary | ICD-10-CM

## 2019-04-12 DIAGNOSIS — Z820 Family history of epilepsy and other diseases of the nervous system: Secondary | ICD-10-CM

## 2019-04-12 DIAGNOSIS — R0683 Snoring: Secondary | ICD-10-CM

## 2019-04-12 DIAGNOSIS — E663 Overweight: Secondary | ICD-10-CM

## 2019-04-12 DIAGNOSIS — R400 Somnolence: Secondary | ICD-10-CM

## 2019-04-12 DIAGNOSIS — Q211 Atrial septal defect: Secondary | ICD-10-CM

## 2019-04-12 DIAGNOSIS — R0681 Apnea, not elsewhere classified: Secondary | ICD-10-CM

## 2019-04-12 NOTE — Patient Instructions (Signed)

## 2019-04-12 NOTE — Progress Notes (Signed)
Subjective:    Patient ID: Jake Moore is a 43 y.o. male.  HPI     Star Age, MD, PhD [facility] [address], [address] [place],  [address]  Dear Dr. Brigitte Pulse,   I saw your patient, Jake Moore, upon your kind request in my sleep clinic today for initial consultation of his sleep disorder, in particular, concern for underlying obstructive sleep apnea.  The patient is unaccompanied today. 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 Leonie Man), hyperlipidemia, anxiety, hypothyroidism, PFO and overweight state, who reports snoring and excessive daytime somnolence, weight gain, and apneic breathing pauses while asleep.  I reviewed your telemedicine note from 03/04/2019. His blood work through your office on 02/25/2019 showed a mildly elevated TSH at 4.76.  His Epworth sleepiness score is 18 out of 24, fatigue severity score is 20 out of 63.  His wife has noted apneic pauses while he is asleep.  He has had weight gain since his stroke in 2018, in the realm of 40 pounds.  He is currently working with a Physiological scientist.  He is also working on his diet.  He is currently on a gluten-free diet.  He quit drinking alcohol, was never a heavy drinker.  He quit smoking about 5 years ago was an intermittent smoker and also more regular smoker back in college.  His father has a diagnosis of sleep apnea but no longer uses a CPAP machine.  He has a family history of vascular disease.  He denies recurrent morning headaches or night to night nocturia.  He is typically in bed by 11 and rise time is around 7.  He has a TV in the bedroom but turns it off at night.  He has 1 dog in the household, the dog typically sleeps on the bed with them.  He lives with his wife and 3 children ages 89, 80 and 18.  He works in Product manager.  He has  paresthesias in the left hemibody since the stroke but otherwise recuperated well, balance and coordination improved.  His Past Medical History Is Significant For: Past Medical History:  Diagnosis Date  . Anxiety   . CVA (cerebral vascular accident) [facility])     His Past Surgical History Is Significant For: Past Surgical History:  Procedure Laterality Date  . TEE WITHOUT CARDIOVERSION N/A 04/14/2017   Procedure: TRANSESOPHAGEAL ECHOCARDIOGRAM (TEE);  Surgeon: Jolaine Artist, MD;  Location: [facility] ENDOSCOPY;  Service: Cardiovascular;  Laterality: N/A;    His Family History Is Significant For: Family History  Problem Relation Age of Onset  . Other Sister        hypercoagulable state with SCAD  . CAD Maternal Grandfather   . Sleep apnea Father     His Social History Is Significant For: Social History   Socioeconomic History  . Marital status: Single    Spouse name: Not on file  . Number of children: Not on file  . Years of education: Not on file  . Highest education level: Not on file  Occupational History  . Not on file  Tobacco Use  . Smoking status: Former Smoker    Years: 20.00    Types: Cigarettes    Quit date: 01/15/2016    Years since quitting: 3.2  . Smokeless tobacco: Former Systems developer    Types: Chew  Substance and Sexual  Activity  . Alcohol use: Yes    Alcohol/week: 10.0 standard drinks    Types: 10 Cans of beer per week    Comment: social 10  beers per week  . Drug use: No  . Sexual activity: Yes  Other Topics Concern  . Not on file  Social History Narrative  . Not on file   Social Determinants of Health   Financial Resource Strain:   . Difficulty of Paying Living Expenses: Not on file  Food Insecurity:   . Worried About Programme researcher, broadcasting/film/videounning Out of Food in the Last Year: Not on file  . Ran Out of Food in the Last Year: Not on file  Transportation Needs:   . Lack of Transportation (Medical): Not on file  . Lack of Transportation (Non-Medical): Not on file  Physical  Activity:   . Days of Exercise per Week: Not on file  . Minutes of Exercise per Session: Not on file  Stress:   . Feeling of Stress : Not on file  Social Connections:   . Frequency of Communication with Friends and Family: Not on file  . Frequency of Social Gatherings with Friends and Family: Not on file  . Attends Religious Services: Not on file  . Active Member of Clubs or Organizations: Not on file  . Attends BankerClub or Organization Meetings: Not on file  . Marital Status: Not on file    His Allergies Are:  Allergies  Allergen Reactions  . Penicillins Hives    Has patient had a PCN reaction causing immediate rash, facial/tongue/throat swelling, SOB or lightheadedness with hypotension: YES Has patient had a PCN reaction causing severe rash involving mucus membranes or skin necrosis: NO Has patient had a PCN reaction that required hospitalizationNO Has patient had a PCN reaction occurring within the last 10 years: NO If all of the above answers are "NO", then may proceed with Cephalosporin use. Has patient had a PCN reaction causing immediate rash, facial/tongue/throat swelling, SOB or lightheadedness with hypotension: YES Has patient had a PCN reaction causing severe rash involving mucus membranes or skin necrosis: NO Has patient had a PCN reaction that required hospitalizationNO Has patient had a PCN reaction occurring within the last 10 years: NO If all of the above answers are "NO", then may proceed with Cephalosporin use.  :   His Current Medications Are:  Outpatient Encounter Medications as of 04/12/2019  Medication Sig  . apixaban (ELIQUIS) 5 MG TABS tablet Take 5 mg by mouth daily.  Marland Kitchen. aspirin EC 81 MG tablet Take 81 mg by mouth daily.  Marland Kitchen. REPATHA SURECLICK 140 MG/ML SOAJ INJECT 1 PEN INTO SKIN EVERY 14 DAYS  . rosuvastatin (CRESTOR) 40 MG tablet Take 1 tablet (40 mg total) by mouth daily. (Patient taking differently: Take 20 mg by mouth daily. )  . SYNTHROID 50 MCG tablet  Take 50 mcg by mouth daily.  . [DISCONTINUED] apixaban (ELIQUIS) 5 MG TABS tablet Take by mouth.  . [DISCONTINUED] desvenlafaxine (PRISTIQ) 50 MG 24 hr tablet Take 50 mg by mouth daily.   No facility-administered encounter medications on file as of 04/12/2019.  :  Review of Systems:  Out of a complete 14 point review of systems, all are reviewed and negative with the exception of these symptoms as listed below: Review of Systems  Neurological:       Pt presents today and since his stroke 2 yrs ago he has had a 40lb weight gain. His wife has witnessed apnea events and snoring. Pt  also has woke himself up gasping for air. Here to rule out apnea. Patient states his dad was dg with OSA as well. Never had a SS  Sitting and reading:3 Watching TV:2 Sitting inactive in a public place (ex. Theater or meeting):2 As a passenger in a car for an hour without a break:3 Lying down to rest in the afternoon:3 Sitting and talking to someone:1 Sitting quietly after lunch (no alcohol):2 In a car, while stopped in traffic:2 Total:18     Objective:  Neurological Exam  Physical Exam Physical Examination:   Vitals:   04/12/19 1522  BP: 134/88  Pulse: 68    General Examination: The patient is a very pleasant 42 y.o. male in no acute distress. He appears well-developed and well-nourished and well groomed.   HEENT: Normocephalic, atraumatic, pupils are equal, round and reactive to light, extraocular tracking is good without limitation to gaze excursion or nystagmus noted. Hearing is grossly intact. Face is symmetric with normal facial animation. Speech is clear with no dysarthria noted. There is no hypophonia. There is no lip, neck/head, jaw or voice tremor. Neck is supple with full range of passive and active motion. There are no carotid bruits on auscultation. Oropharynx exam reveals: mild mouth dryness, adequate dental hygiene and mild airway crowding, due to Small airway entry and slightly elongated  uvula, tonsils are small,?  Absent.  He reports no tonsillectomy.  Mallampati is class I, neck circumference is 16 inches.  Tongue protrudes centrally in palate elevates symmetrically.  Chest: Clear to auscultation without wheezing, rhonchi or crackles noted.  Heart: S1+S2+0, regular and normal without murmurs, rubs or gallops noted.   Abdomen: Soft, non-tender and non-distended with normal bowel sounds appreciated on auscultation.  Extremities: There is no pitting edema in the distal lower extremities bilaterally.   Skin: Warm and dry without trophic changes noted.   Musculoskeletal: exam reveals no obvious joint deformities, tenderness or joint swelling or erythema.   Neurologically:  Mental status: The patient is awake, alert and oriented in all 4 spheres. His immediate and remote memory, attention, language skills and fund of knowledge are appropriate. There is no evidence of aphasia, agnosia, apraxia or anomia. Speech is clear with normal prosody and enunciation. Thought process is linear. Mood is normal and affect is normal.  Cranial nerves II - XII are as described above under HEENT exam.  Motor exam: Normal bulk, strength and tone is noted. There is no tremor, Romberg is negative. Fine motor skills and coordination: grossly intact.  Cerebellar testing: No dysmetria or intention tremor. Finger-to-nose is good bilaterally. There is no truncal or gait ataxia.  Sensory exam: intact to light touch in the upper and lower extremities.  Gait, station and balance: He stands easily. No veering to one side is noted. No leaning to one side is noted. Posture is age-appropriate and stance is narrow based. Gait shows normal stride length and normal pace. No problems turning are noted. Tandem walk is unremarkable.                Assessment and Plan:  In summary, Jake Moore is a very pleasant 43 y.o.-year old male with an underlying medical history of right cerebellar and right medullary  ischemic stroke in 2018 (seen by Dr. Pearlean Brownie), hyperlipidemia, anxiety, hypothyroidism, PFO and overweight state, whose history and physical exam are concerning for obstructive sleep apnea (OSA). I had a long chat with the patient about my findings and the diagnosis of OSA, its prognosis and treatment  options. We talked about medical treatments, surgical interventions and non-pharmacological approaches. I explained in particular the risks and ramifications of untreated moderate to severe OSA, especially with respect to developing cardiovascular disease down the Road, including congestive heart failure, difficult to treat hypertension, cardiac arrhythmias, or stroke. Even type 2 diabetes has, in part, been linked to untreated OSA. Symptoms of untreated OSA include daytime sleepiness, memory problems, mood irritability and mood disorder such as depression and anxiety, lack of energy, as well as recurrent headaches, especially morning headaches. We talked about trying to maintain a healthy lifestyle in general, as well as the importance of weight control. We also talked about the importance of good sleep hygiene. I recommended the following at this time: sleep study.   I explained the sleep test procedure to the patient and also outlined possible surgical and non-surgical treatment options of OSA, including the use of a custom-made dental device (which would require a referral to a specialist dentist or oral surgeon), upper airway surgical options, such as traditional UPPP or a novel less invasive surgical option in the form of Inspire hypoglossal nerve stimulation (which would involve a referral to an ENT surgeon). I also explained the CPAP treatment option to the patient, who indicated that he would be willing to try CPAP if the need arises. I explained the importance of being compliant with PAP treatment, not only for insurance purposes but primarily to improve His symptoms, and for the patient's long term health  benefit, including to reduce His cardiovascular risks. I answered all his questions today and the patient was in agreement. I plan to see him back after the sleep study is completed and encouraged him to call with any interim questions, concerns, problems or updates.   Thank you very much for allowing me to participate in the care of this nice patient. If I can be of any further assistance to you please do not hesitate to call me at [phone].  Sincerely,   Huston Foley, MD, PhD

## 2019-04-28 ENCOUNTER — Ambulatory Visit (INDEPENDENT_AMBULATORY_CARE_PROVIDER_SITE_OTHER): Payer: BC Managed Care – PPO | Admitting: Neurology

## 2019-04-28 ENCOUNTER — Other Ambulatory Visit: Payer: Self-pay

## 2019-04-28 DIAGNOSIS — E663 Overweight: Secondary | ICD-10-CM

## 2019-04-28 DIAGNOSIS — R0683 Snoring: Secondary | ICD-10-CM

## 2019-04-28 DIAGNOSIS — R400 Somnolence: Secondary | ICD-10-CM

## 2019-04-28 DIAGNOSIS — Z820 Family history of epilepsy and other diseases of the nervous system: Secondary | ICD-10-CM

## 2019-04-28 DIAGNOSIS — G4733 Obstructive sleep apnea (adult) (pediatric): Secondary | ICD-10-CM

## 2019-04-28 DIAGNOSIS — Z8673 Personal history of transient ischemic attack (TIA), and cerebral infarction without residual deficits: Secondary | ICD-10-CM

## 2019-04-28 DIAGNOSIS — Q2112 Patent foramen ovale: Secondary | ICD-10-CM

## 2019-04-28 DIAGNOSIS — Q211 Atrial septal defect: Secondary | ICD-10-CM

## 2019-04-28 DIAGNOSIS — I639 Cerebral infarction, unspecified: Secondary | ICD-10-CM

## 2019-04-28 DIAGNOSIS — R0681 Apnea, not elsewhere classified: Secondary | ICD-10-CM

## 2019-05-06 NOTE — Procedures (Signed)
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 Pearlean Brownie), hyperlipidemia, anxiety, hypothyroidism, PFO and overweight state, who reports snoring and excessive daytime somnolence, weight gain, and apneic breathing pauses while asleep.   Summary & Diagnosis:     OSA  Recommendations:     This home sleep test demonstrates severe obstructive sleep apnea (by number of events) with a total AHI of 54.8/hour and O2 nadir of 88%. Treatment with positive airway pressure (in the form of CPAP) is recommended. This will require a full night CPAP titration study for proper treatment settings, O2 monitoring and mask fitting. Based on the severity of the sleep disordered breathing an attended titration study is indicated. However, patient's insurance has denied an attended sleep study; therefore, the patient will be advised to proceed with an autoPAP titration/trial at home for now. Please note that untreated obstructive sleep apnea may carry additional perioperative morbidity. Patients with significant obstructive sleep apnea should receive perioperative PAP therapy and the surgeons and particularly the anesthesiologist should be informed of the diagnosis and the severity of the sleep disordered breathing. The patient should be cautioned not to drive, work at heights, or operate dangerous or heavy equipment when tired or sleepy. Review and reiteration of good sleep hygiene measures should be pursued with any patient. Other causes of the patient's symptoms, including circadian rhythm disturbances, an underlying mood  disorder, medication effect and/or an underlying medical problem cannot be ruled out based on this test. Clinical correlation is recommended. The patient and his referring provider will be notified of the test results. The patient will be seen in follow up in sleep clinic at [facility].  I certify that I have reviewed the raw data recording prior to the issuance of this report in accordance with the standards of the American Academy of Sleep Medicine (AASM).  Huston Foley, MD, PhD [facility]) Diplomat, ABPN (Neurology and Sleep)            Sleep Summary    Oxygen Saturation Statistics     Start Study Time: End Study Time: Total Recording Time:          11:35:33 PM 7:48:36 AM   8 h, 13 min  Total Sleep Time % REM of Sleep Time:  6 h, 24 min  15.4    Mean: 94 Minimum: 88 Maximum: 99  Mean of Desaturations Nadirs (%):   92  Oxygen Desaturation. %:   4-9 10-20 >20 Total  Events Number Total   239  2 99.2 0.8  0 0.0  241 100.0  Oxygen Saturation: <90 <=88 <85 <80 <70  Duration (minutes): Sleep % 0.4 0.1  0.1 0.0  0.0 0.0 0.0 0.0 0.0 0.0     Respiratory Indices      Total Events REM NREM All Night  pRDI:  352  pAHI:  348 ODI:  241  pAHIc: 124 % CSR: 0.0 30.9 30.9 9.3 3.1 59.8 59.1 43.1 22.5 55.4 54.8 37.9 19.5       Pulse Rate Statistics during  Sleep (BPM)      Mean: 75 Minimum: 46 Maximum: 104    Indices are calculated using technically valid sleep time of  6 h, 21 min. pRDI/pAHI are calculated using oxi desaturations ? 3%  Body Position Statistics  Position Supine Prone Right Left Non-Supine  Sleep (min) 324.6 24.0 0.0 35.5 59.5  Sleep % 84.5 6.2 0.0 9.2 15.5  pRDI 63.3 7.7 N/A 15.2 12.2  pAHI 62.5 7.7 N/A 15.2 12.2  ODI 43.7 2.6 N/A 8.5 6.1     Snoring Statistics Snoring Level (dB) >40 >50 >60 >70 >80 >Threshold (45)  Sleep (min) 55.7 6.6 4.1 0.0 0.0 8.2  Sleep % 14.5 1.7 1.1 0.0 0.0 2.1    Mean: 41 dB Sleep  Stages Chart                                                                             pAHI=54.8                                                                                              Mild              Moderate                    Severe                                                 5              15                    30

## 2019-05-06 NOTE — Addendum Note (Signed)
Addended by: Huston Foley on: 05/06/2019 06:10 PM   Modules accepted: Orders

## 2019-05-06 NOTE — Progress Notes (Signed)
Patient referred by Dr. Clelia Croft, seen by me on 04/12/19, HST on 04/29/19.    Please call and notify the patient that the recent home sleep test showed obstructive sleep apnea in the severe range (by number of events, AHI 54.8/h, O2 nadir 88%). While I recommend treatment for this in the form CPAP, his insurance will not approve a sleep study for this. They will likely only approve a trial of autoPAP, which means, that we don't have to bring him in for a sleep study with CPAP, but will let him try an autoPAP machine at home, through a DME company (of his choice, or as per insurance requirement). The DME representative will educate him on how to use the machine, how to put the mask on, etc. I have placed an order in the chart. Please send referral, talk to patient, send report to referring MD. We will need a FU in sleep clinic for 10 weeks post-PAP set up, please arrange that with me or one of our NPs. Thanks,   Huston Foley, MD, PhD [facility])

## 2019-05-07 DIAGNOSIS — Z20822 Contact with and (suspected) exposure to covid-19: Secondary | ICD-10-CM | POA: Diagnosis not present

## 2019-05-10 ENCOUNTER — Other Ambulatory Visit: Payer: BC Managed Care – PPO

## 2019-05-11 ENCOUNTER — Encounter: Payer: Self-pay | Admitting: Neurology

## 2019-05-11 DIAGNOSIS — Z20818 Contact with and (suspected) exposure to other bacterial communicable diseases: Secondary | ICD-10-CM | POA: Diagnosis not present

## 2019-05-11 DIAGNOSIS — Z20822 Contact with and (suspected) exposure to covid-19: Secondary | ICD-10-CM | POA: Diagnosis not present

## 2019-05-11 DIAGNOSIS — R439 Unspecified disturbances of smell and taste: Secondary | ICD-10-CM | POA: Diagnosis not present

## 2019-05-13 ENCOUNTER — Other Ambulatory Visit: Payer: Self-pay | Admitting: Cardiology

## 2019-05-19 ENCOUNTER — Telehealth: Payer: BC Managed Care – PPO | Admitting: Physician Assistant

## 2019-05-19 DIAGNOSIS — F4322 Adjustment disorder with anxiety: Secondary | ICD-10-CM | POA: Diagnosis not present

## 2019-05-26 DIAGNOSIS — G4733 Obstructive sleep apnea (adult) (pediatric): Secondary | ICD-10-CM | POA: Diagnosis not present

## 2019-06-02 ENCOUNTER — Telehealth: Payer: Self-pay | Admitting: Adult Health

## 2019-06-02 DIAGNOSIS — Z20828 Contact with and (suspected) exposure to other viral communicable diseases: Secondary | ICD-10-CM | POA: Diagnosis not present

## 2019-06-02 DIAGNOSIS — F4322 Adjustment disorder with anxiety: Secondary | ICD-10-CM | POA: Diagnosis not present

## 2019-06-02 DIAGNOSIS — U071 COVID-19: Secondary | ICD-10-CM | POA: Diagnosis not present

## 2019-06-02 DIAGNOSIS — R439 Unspecified disturbances of smell and taste: Secondary | ICD-10-CM | POA: Diagnosis not present

## 2019-06-02 DIAGNOSIS — J309 Allergic rhinitis, unspecified: Secondary | ICD-10-CM | POA: Diagnosis not present

## 2019-06-02 DIAGNOSIS — Z87898 Personal history of other specified conditions: Secondary | ICD-10-CM | POA: Diagnosis not present

## 2019-06-02 NOTE — Telephone Encounter (Signed)
Called to discussed positive Covid 19 results and possible treatment option with Monoclonal antibody infusion . Unable to reach . Left message to call back.   Tammy Parrett NP-C

## 2019-06-16 DIAGNOSIS — F4322 Adjustment disorder with anxiety: Secondary | ICD-10-CM | POA: Diagnosis not present

## 2019-06-21 ENCOUNTER — Other Ambulatory Visit: Payer: Self-pay | Admitting: Cardiology

## 2019-07-08 ENCOUNTER — Other Ambulatory Visit: Payer: Self-pay

## 2019-07-08 ENCOUNTER — Ambulatory Visit (INDEPENDENT_AMBULATORY_CARE_PROVIDER_SITE_OTHER): Payer: BC Managed Care – PPO | Admitting: Internal Medicine

## 2019-07-08 ENCOUNTER — Encounter: Payer: Self-pay | Admitting: Internal Medicine

## 2019-07-08 VITALS — BP 120/83 | HR 75 | Temp 97.1°F | Resp 19 | Ht 70.0 in | Wt 208.4 lb

## 2019-07-08 DIAGNOSIS — E782 Mixed hyperlipidemia: Secondary | ICD-10-CM

## 2019-07-08 DIAGNOSIS — E7841 Elevated Lipoprotein(a): Secondary | ICD-10-CM | POA: Diagnosis not present

## 2019-07-08 DIAGNOSIS — Z8673 Personal history of transient ischemic attack (TIA), and cerebral infarction without residual deficits: Secondary | ICD-10-CM

## 2019-07-08 MED ORDER — ROSUVASTATIN 10 MG TABLET
ORAL_TABLET | 1 refills | 0 days | Status: CP
Start: 2019-07-08 — End: ?

## 2019-07-08 NOTE — Progress Notes (Signed)
LIPID CLINIC CONSULT NOTE  Chief Complaint:  Follow-up dyslipidemia  Primary Care Physician: Jake Clan, MD  Primary Cardiologist:  No primary care provider on file.  HPI:  Jake Moore is a 44 y.o. male who is being seen today for the evaluation of dyslipidemia at the request of Jake Clan, MD. Jake Moore was seen today for virtual visit.  He is a patient of Jake Moore and has had previous work-up by my partners in cardiology including Jake Moore, Jake Moore (who he is friends with) and even communications with Jake Moore.  Jake Moore unfortunately has had a TIA/stroke which is unusual given his young age.  Family history is significant for a sister who has a hypercoagulable disorder and also I was found to have an elevated LP(a).  This led to testing for LP(a) which was significantly elevated at 385 by lab work in November 2019.  Based on this he has been on aggressive lipid management.  Currently taking rosuvastatin 40 mg daily.  He was also started on Repatha as there is evidence for LP(a) lowering with this medication.  Additionally he takes Eliquis 5 mg twice daily for stroke prevention.  During his work-up he underwent a TEE which showed a small PFO.  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  He is additionally listed as taking aspirin 81 mg daily.  Discussion with his neurologist at [facility] felt that he may do fine with just aspirin alone.  He sought out second opinions and was seen by both neurology and in the lipid clinic by Dr. Mariane Baumgarten at [facility].  Dr. Lodema Hong felt that his lipids were appropriately treated for what we have currently available.  We did discuss at some length today about several enrolling clinical trials including an antisense oligonucleotide to LP(a) which is currently in phase 3  clinical trials.  Unfortunately there are no research sites in [place].  07/08/2019  Jake Moore returns today for follow-up.  Overall he is feeling well.  He is recovered from his stroke but has not been able to get back to normal exercise.  Unfortunately has been a weight gain which is up from 179 pounds to 208 pounds over the past couple years.  This is been associated with an increase in his lipids.  LDL has gone up from 40-76 and his total cholesterol is up from 117-1 81.  He remains on Repatha, rosuvastatin 40 mg and aspirin and Eliquis.  He has had no further stroke.  He does have a very high LP(a) for which we were investigating therapy.  Recently we have been notified that local clinical trials center has a clinical study for LP(a) lowering with pelcarseran -which is an antisense to LP(a) made by [employer].  This is being coordinated by Mercy Riding, PharmD.   PMHx:  Past Medical History:  Diagnosis Date  . Anxiety   . CVA (cerebral vascular accident) [facility])     Past Surgical History:  Procedure Laterality Date  . TEE WITHOUT CARDIOVERSION N/A 04/14/2017   Procedure: TRANSESOPHAGEAL ECHOCARDIOGRAM (TEE);  Surgeon: Dolores Patty, MD;  Location: [facility] ENDOSCOPY;  Service: Cardiovascular;  Laterality: N/A;    FAMHx:  Family History  Problem Relation Age of Onset  . Other Sister        hypercoagulable state with SCAD  .  CAD Maternal Grandfather   . Sleep apnea Father     SOCHx:   reports that he quit smoking about 3 years ago. His smoking use included cigarettes. He quit after 20.00 years of use. He has quit using smokeless tobacco.  His smokeless tobacco use included chew. He reports current alcohol use of about 10.0 standard drinks of alcohol per week. He reports that he does not use drugs.  ALLERGIES:  Allergies  Allergen Reactions  . Penicillins Hives    Has patient had a PCN reaction causing immediate rash, facial/tongue/throat swelling, SOB or  lightheadedness with hypotension: YES Has patient had a PCN reaction causing severe rash involving mucus membranes or skin necrosis: NO Has patient had a PCN reaction that required hospitalizationNO Has patient had a PCN reaction occurring within the last 10 years: NO If all of the above answers are "NO", then may proceed with Cephalosporin use. Has patient had a PCN reaction causing immediate rash, facial/tongue/throat swelling, SOB or lightheadedness with hypotension: YES Has patient had a PCN reaction causing severe rash involving mucus membranes or skin necrosis: NO Has patient had a PCN reaction that required hospitalizationNO Has patient had a PCN reaction occurring within the last 10 years: NO If all of the above answers are "NO", then may proceed with Cephalosporin use.    ROS: Pertinent items noted in HPI and remainder of comprehensive ROS otherwise negative.  HOME MEDS: Current Outpatient Medications on File Prior to Visit  Medication Sig Dispense Refill  . apixaban (ELIQUIS) 5 MG TABS tablet Take 5 mg by mouth daily.    Marland Kitchen aspirin EC 81 MG tablet Take 81 mg by mouth daily.    Marland Kitchen REPATHA SURECLICK 979 MG/ML SOAJ INJECT 1 PEN INTO SKIN EVERY 14 DAYS 2 mL 11  . rosuvastatin (CRESTOR) 40 MG tablet Take 1 tablet (40 mg total) by mouth daily. (Patient taking differently: Take 20 mg by mouth daily. ) 90 tablet 3  . SYNTHROID 50 MCG tablet Take 50 mcg by mouth daily.     No current facility-administered medications on file prior to visit.    LABS/IMAGING: No results found for this or any previous visit (from the past 48 hour(s)). No results found.  LIPID PANEL:    Component Value Date/Time   CHOL 117 05/08/2018 0815   TRIG 71 05/08/2018 0815   HDL 63 05/08/2018 0815   CHOLHDL 1.9 05/08/2018 0815   CHOLHDL 2.0 04/12/2017 0254   VLDL 13 04/12/2017 0254   LDLCALC 40 05/08/2018 0815    WEIGHTS: Wt Readings from Last 3 Encounters:  07/08/19 208 lb 6.4 oz (94.5 kg)  04/12/19  209 lb (94.8 kg)  12/16/17 196 lb 9.6 oz (89.2 kg)    VITALS: BP 120/83   Pulse 75   Temp (!) 97.1 F (36.2 C)   Resp 19   Ht 5\' 10"  (1.778 m)   Wt 208 lb 6.4 oz (94.5 kg)   SpO2 97%   BMI 29.90 kg/m   EXAM: Deferred  EKG: Deferred  ASSESSMENT: 1. History of stroke 2. Small PFO 3. 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 Dyslipidemia  PLAN: 1.   Mr. Black has done well with no recurrent stroke and has had significant reduction in his lipid profile although recently his numbers are increased secondary to weight gain and inactivity.  He will need to work more aggressively on that.  We discussed his high LP(a) and a local option for clinical trial.  I will reach out to  our pharmacist to connect with them as he is interested in proceeding.  Plan follow-up with me annually.  If he is enrolled in a clinical trial, hopefully we can continue to follow his lipids but will not reassess his LP(a)-I will need some clarification from the study coordinators about that.  Follow-up in 1 year.  Chrystie Nose, MD, [facility], FACP  [place]  [facility]  Medical Director of the Advanced Lipid Disorders &  Cardiovascular Risk Reduction Clinic Diplomate of the American Board of Clinical Lipidology Attending Cardiologist  Direct Dial: [phone]  Fax: [phone]  Website:  [email or web address] Venora [email or web address] 07/08/2019, 8:59 AM

## 2019-07-08 NOTE — Patient Instructions (Signed)
Medication Instructions:  Your physician recommends that you continue on your current medications as directed. Please refer to the Current Medication list given to you today.  *If you need a refill on your cardiac medications before your next appointment, please call your pharmacy*   Lab Work: FASTING lab work to check cholesterol prior to your next appointment   If you have labs (blood work) drawn today and your tests are completely normal, you will receive your results only by: Marland Kitchen MyChart Message (if you have MyChart) OR . A paper copy in the mail If you have any lab test that is abnormal or we need to change your treatment, we will call you to review the results.   Testing/Procedures: NONE   Follow-Up: At [facility], you and your health needs are our priority.  As part of our continuing mission to provide you with exceptional heart care, we have created designated Provider Care Teams.  These Care Teams include your primary Cardiologist (physician) and Advanced Practice Providers (APPs -  Physician Assistants and Nurse Practitioners) who all work together to provide you with the care you need, when you need it.  We recommend signing up for the patient portal called "MyChart".  Sign up information is provided on this After Visit Summary.  MyChart is used to connect with patients for Virtual Visits (Telemedicine).  Patients are able to view lab/test results, encounter notes, upcoming appointments, etc.  Non-urgent messages can be sent to your provider as well.   To learn more about what you can do with MyChart, go to [email or web address].    Your next appointment:   12 month(s)  The format for your next appointment:   Either In Person or Virtual  Provider:   Kirtland Bouchard [place] Hilty, MD   Other Instructions

## 2019-07-08 NOTE — Unmapped (Signed)
Refill

## 2019-07-15 ENCOUNTER — Encounter: Payer: Self-pay | Admitting: Cardiology

## 2019-07-29 MED ORDER — RIVAROXABAN 20 MG PO TABS: 20.0000 mg | ORAL_TABLET | Freq: Every day | ORAL | 3 refills | Status: DC

## 2019-09-04 NOTE — Progress Notes (Deleted)
Primary Physician/Referring:  Martha Clan, MD  Patient ID: Jake Moore, male    DOB: 05-Sep-1975, 44 y.o.   MRN: [id number]  No chief complaint on file.  HPI:    Jake Moore  is a 44 y.o. ***  Past Medical History:  Diagnosis Date  . Anxiety   . CVA (cerebral vascular accident) [facility])    Past Surgical History:  Procedure Laterality Date  . TEE WITHOUT CARDIOVERSION N/A 04/14/2017   Procedure: TRANSESOPHAGEAL ECHOCARDIOGRAM (TEE);  Surgeon: Dolores Patty, MD;  Location: [facility] ENDOSCOPY;  Service: Cardiovascular;  Laterality: N/A;   Family History  Problem Relation Age of Onset  . Other Sister        hypercoagulable state with SCAD  . CAD Maternal Grandfather   . Sleep apnea Father     Social History   Tobacco Use  . Smoking status: Former Smoker    Years: 20.00    Types: Cigarettes    Quit date: 01/15/2016    Years since quitting: 3.6  . Smokeless tobacco: Former Neurosurgeon    Types: Chew  Substance Use Topics  . Alcohol use: Yes    Alcohol/week: 10.0 standard drinks    Types: 10 Cans of beer per week    Comment: social 10  beers per week   Marital Status: Single  ROS  ***Review of Systems  Cardiovascular: Negative for dyspnea on exertion, leg swelling and syncope.  Respiratory: Negative for shortness of breath.   Gastrointestinal: Negative for melena.   Objective  There were no vitals taken for this visit.  Vitals with BMI 07/08/2019 04/12/2019 08/17/2018  Height 5\' 10"  5\' 10"  -  Weight 208 lbs 6 oz 209 lbs -  BMI 29.9 29.99 -  Systolic 120 134  Diastolic 83 88 94  Pulse 75 68 -     ***Physical Exam  Constitutional: He appears well-developed and well-nourished. No distress.  Neck: No thyromegaly present.  Cardiovascular: Normal rate, regular rhythm and intact distal pulses. Exam reveals no gallop.  No murmur heard. No leg edema, no JVD.   Pulmonary/Chest: Breath sounds normal. No accessory muscle usage.  Abdominal: Soft. Bowel sounds are  normal.   Laboratory examination:   No results for input(s): NA, K, CL, CO2, GLUCOSE, BUN, CREATININE, CALCIUM, GFRNONAA, GFRAA in the last 8760 hours. CrCl cannot be calculated (Patient's most recent lab result is older than the maximum 21 days allowed.).  CMP Latest Ref Rng & Units 05/08/2018 05/29/2017 05/29/2017  Glucose 65 - 99 mg/dL - - 81  BUN 6 - 20 mg/dL - - 14  Creatinine 07/27/2017 - 1.24 mg/dL - 07/27/2017 3.38  Sodium 2.50 - 145 mmol/L - - 140  Potassium 3.5 - 5.1 mmol/L - - 4.2  Chloride 101 - 111 mmol/L - - 102  CO2 22 - 32 mmol/L - - -  Calcium 8.9 - 10.3 mg/dL - - -  Total Protein 6.0 - 8.5 g/dL 6.9 - -  Total Bilirubin 0.0 - 1.2 mg/dL 0.3 - -  Alkaline Phos 39 - 117 IU/L 61 - -  AST 0 - 40 IU/L 30 - -  ALT 0 - 44 IU/L 33 - -   CBC Latest Ref Rng & Units 05/29/2017 05/29/2017 05/29/2017  WBC 4.0 - 10.5 K/uL 4.7 - 5.0  Hemoglobin 13.0 - 17.0 g/dL 07/27/2017 07/27/2017 34.1  Hematocrit 39.0 - 52.0 % 40.0 41.0 40.8  Platelets 150 - 400 K/uL 170 - 174   Lipid Panel  Component Value Date/Time   CHOL 117 05/08/2018 0815   TRIG 71 05/08/2018 0815   HDL 63 05/08/2018 0815   CHOLHDL 1.9 05/08/2018 0815   CHOLHDL 2.0 04/12/2017 0254   VLDL 13 04/12/2017 0254   LDLCALC 40 05/08/2018 0815   HEMOGLOBIN A1C Lab Results  Component Value Date   HGBA1C 5.2 04/12/2017   MPG 102.54 04/12/2017   TSH No results for input(s): TSH in the last 8760 hours.  External labs:   ***  Medications and allergies   Allergies  Allergen Reactions  . Penicillins Hives    Has patient had a PCN reaction causing immediate rash, facial/tongue/throat swelling, SOB or lightheadedness with hypotension: YES Has patient had a PCN reaction causing severe rash involving mucus membranes or skin necrosis: NO Has patient had a PCN reaction that required hospitalizationNO Has patient had a PCN reaction occurring within the last 10 years: NO If all of the above answers are "NO", then may proceed with Cephalosporin use. Has  patient had a PCN reaction causing immediate rash, facial/tongue/throat swelling, SOB or lightheadedness with hypotension: YES Has patient had a PCN reaction causing severe rash involving mucus membranes or skin necrosis: NO Has patient had a PCN reaction that required hospitalizationNO Has patient had a PCN reaction occurring within the last 10 years: NO If all of the above answers are "NO", then may proceed with Cephalosporin use.     Current Outpatient Medications  Medication Instructions  . aspirin EC 81 mg, Oral, Daily  . REPATHA SURECLICK 267 MG/ML SOAJ INJECT 1 PEN INTO SKIN EVERY 14 DAYS  . rivaroxaban (XARELTO) 20 mg, Oral, Daily with supper  . rosuvastatin (CRESTOR) 40 mg, Oral, Daily  . Synthroid 50 mcg, Oral, Daily   Radiology:   No results found.  Cardiac Studies:   *** EKG  ***  ***  Assessment  No diagnosis found.   No orders of the defined types were placed in this encounter.   There are no discontinued medications.  Recommendations:   ***  Adrian Prows, MD, [facility] 09/04/2019, 4:06 PM [facility] [place] Pager: [phone] Office: [phone]

## 2019-09-08 ENCOUNTER — Encounter: Payer: BC Managed Care – PPO | Admitting: Cardiology

## 2019-09-08 ENCOUNTER — Other Ambulatory Visit: Payer: Self-pay

## 2019-09-08 DIAGNOSIS — Z006 Encounter for examination for normal comparison and control in clinical research program: Secondary | ICD-10-CM

## 2019-09-08 NOTE — Progress Notes (Signed)
Patient here for heritage "LPA" trial for medication administration. No complications.  Yates Decamp, MD, [facility] 09/08/2019, 2:49 PM [facility] [place] Office: [phone]

## 2019-09-17 ENCOUNTER — Other Ambulatory Visit: Payer: Self-pay | Admitting: Internal Medicine

## 2019-09-21 MED ORDER — ROSUVASTATIN 10 MG TABLET
ORAL_TABLET | Freq: Every day | ORAL | 0 refills | 30.00000 days | Status: CP
Start: 2019-09-21 — End: ?

## 2019-09-22 ENCOUNTER — Ambulatory Visit: Payer: BC Managed Care – PPO | Admitting: Neurology

## 2019-10-07 ENCOUNTER — Telehealth: Payer: Self-pay

## 2019-10-07 ENCOUNTER — Encounter: Payer: BC Managed Care – PPO | Admitting: Cardiology

## 2019-10-07 ENCOUNTER — Encounter: Payer: Self-pay | Admitting: Neurology

## 2019-10-07 ENCOUNTER — Ambulatory Visit: Payer: BC Managed Care – PPO | Admitting: Neurology

## 2019-10-07 NOTE — Telephone Encounter (Signed)
Pt showed up 45 minutes late for appt. Pt r/s per Jillian.

## 2019-10-11 ENCOUNTER — Encounter: Payer: BC Managed Care – PPO | Admitting: Cardiology

## 2019-10-11 ENCOUNTER — Other Ambulatory Visit: Payer: Self-pay

## 2019-10-11 DIAGNOSIS — Z006 Encounter for examination for normal comparison and control in clinical research program: Secondary | ICD-10-CM

## 2019-10-11 NOTE — Progress Notes (Signed)
Research visit for "Heritage LPA trial"

## 2019-10-19 ENCOUNTER — Other Ambulatory Visit: Payer: Self-pay

## 2019-10-19 ENCOUNTER — Encounter: Payer: Self-pay | Admitting: Neurology

## 2019-10-19 ENCOUNTER — Ambulatory Visit (INDEPENDENT_AMBULATORY_CARE_PROVIDER_SITE_OTHER): Payer: BC Managed Care – PPO | Admitting: Neurology

## 2019-10-19 VITALS — BP 113/77 | HR 82 | Ht 70.0 in | Wt 188.8 lb

## 2019-10-19 DIAGNOSIS — R202 Paresthesia of skin: Secondary | ICD-10-CM

## 2019-10-19 DIAGNOSIS — I6990 Unspecified sequelae of unspecified cerebrovascular disease: Secondary | ICD-10-CM | POA: Diagnosis not present

## 2019-10-19 NOTE — Patient Instructions (Signed)
I had a long discussion with the patient regarding his poor stroke paresthesias as well as hand paresthesias which are likely result of underlying anxiety and stress due to a lot of things going on in his life.  I advised him to increase participation stress laxation techniques like meditation and yoga and regular exercise.  He was advised to continue Xarelto for stroke prevention but to take it with the largest meal of the day consistently at the same time every day.  Continue Repatha and Crestor for his hyperlipidemia and participation in [employer] lipoprotein a trial with Dr. Jacinto Halim.  He will return for follow-up in the future only as necessary and no schedule appointment was made.

## 2019-10-19 NOTE — Progress Notes (Signed)
[facility] [address] [place]. [place] [address] [phone]       OFFICE FOLLOW-UP NOTE  [name] Date of Birth:  12-07-1975 Medical Record Number:  [id number]   HPI: Mr Jake Moore is a present 44 year old Caucasian male who is seen today for first office follow-up visit following hospital admission for stroke in December 2018. He is accompanied and his wife. History is obtained from the patient, wife and review of electronic medical records. I have personally reviewed imaging for lemons as well as his records from [facility] and [facility] in care everywhereRobert F Moore is an 44 y.o. male with HLD family history  significant for blood clots, prior tobacco use presents to the emergency room after being discharged yesterday for worsening dizziness, gait imbalance nausea headache and progressive of right-sided facial numbness. She came with same symptoms yesterday that started at 8:30 in the morning. During that visit his CT head was done and patient was given meclizine. Reviewing the note is unclear if the patient was made to walk prior to discharge. Patient states that symptoms worsened yesterday at 11 PM and numbness over his face extended from nose to the right half of his face. He states that he lifted weights the night before but denies recent chiropractic manipulation, twisting of his neck. He does complain of neck pain on the left side. His sister and mother had multiple clots. His sister event extensive evaluation including at [facility] and [facility] known to have higher levels of factor VIII and elevated lipoprotein A levels. He takes crestor at home, not on ASA. Date last known well: 12.20.18.Time last known well: 8.15 am.tPA Given: outside window.NIHSS: 1.Baseline MRS 0  CT scan showed no acute abnormality. 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 Otherwise CTA of the neck and brain was unremarkable. MRI scan  of the brain showed right lateral medullary as well as tiny right cerebellar infarct. Transthoracic echo showed normal ejection fraction. Transesophageal echo showed normal cardia Embolism but Showed a Small Right to Left Shunt.Transcranial Doppler Bubble Study Was Also Positive for a Small Right to Left Shunt Only.lupus anticoagulant by LA-PTT and DRVVT negative. Anticardiolipin antibodies negative. Anti-beta-2 glycoprotein 1 antibodies negative. Homocystine 7.3. HDL 71, LDL 57. Factor V Leiden and prothrombin 76546 mutations not present. Antithrombin activity, protein C activity and protein C antigen normal. Total protein S antigen and protein S activity normal. Sedimentation rate 1. C-reactive protein negative.  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 His sister had a DVT and had age of 65 who was known to have elevated factor VIII levels. The patient's factor VIII level was normal at 146 on 04/21/2007 and on 04/21/2014 it was minimally elevated at 175 which was clinically not significant..  Prothrombin Gene and Factor V Leiden Mutations Were Negative. Lower extremity venous Dopplers were negative for DVT. Hemoglobin A1c was 5.2. LDL cholesterol was 57 mg percent. Patient was started on dual antiplatelet therapy of aspirin and Plavix after being started initially on heparin for a few days. The etiology of patient's stroke was indeterminate patient denied significant neck pain or any physical exertion to suggest dissection at that time. He agreed to proceed in the [facility]. Feels transferred to inpatient rehabilitation but did well and is currently at all. He is subsequently had a second neurological opinion at [facility] neurology with Dr. Blondell Reveal as well as  hematologist' Dr Isaiah Serge for family h/o elevated  factor 8 level. Patient was continued on aspirin and Plavix and not commended long-term decompression. Patient states his balance and gait have improved his has  some intermittent numbness in the left side of the face as well as some transient vertigo. He in fact was readmitted on 05/29/16 because of transient positional vertigo which resolved shortly after admission. This was felt to be peripheral positional vertigo. 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 Patient was seen by physical therapist advised him to vascular stabilization exercises as well as continue aspirin and Plavix and Crestor. Patient states his done well since discharge. No longer feeling dizzy. His been complaining of posterior neck pain and headaches and physical therapies have started dry needling is helping. He had a outpatient heart monitor done at [facility] on 05/19/17 which did not show any evidence of paroxysmal atrial fibrillation. He has in psychiatrist Dr. Evelene Croon for his anxiety and ADHD and she is prescribed Celexa but he has not started it yet. The patient has started going back to work but states that he gets tired easily. Is planning a trip to the [place] and he wants to start exercising and increasing his physical activity gradually. Is tolerating aspirin and Plavix without bruising or bleeding. Is tolerating vascular muscle aches and pains. His blood pressure well controlled. It is 120/76. His considering starting PCS 9 iinhibotor injections for his elevated lipoprotein a and family history of premature coronary artery disease and his insurance has denied this and he will try to get samples from his cardiologist    Update 12/16/2017 : He returns follow-up after last visit 6 months ago. He continues to do well from the stroke standpoint without recurrent stroke or TIA symptoms. He still has occasional numbness on his face and body but it is not bothersome. He has been started on eliquis by his cardiologist from [facility] for elevated lipoprotein.a  He also seems to be  taking a baby aspirin. Hishas not had any major bruising or bleeding episodes. He states his gained some weight and plans to exercise and lose it off. He states his posterior neck pain and headaches are much better. He was initially started on Zoloft by his primary physician which helped but he had some GI issues and more recently has been switched to Pristiq. His blood pressure is well controlled. He is tolerating Crestor well without any side effects. His insurance company denied Repatha injections for his elevated lipoprotein.  Update 10/19/2019 : He returns for follow-up after last visit with me nearly 2 years ago.  He is doing well and has not had recurrent stroke or TIA symptoms.  He was concerned as recently started some worsening of his post stroke paresthesias on the right face as well as some in the left hand.  Occasionally is also having intermittent tingling of his fingertips.  He does admit to significant increasing stress recently because of his work as well as starting a new diet as well as exercise program.  Is also had a medication change.  He has been participating in the new lipoprotein a trial with Dr. Jacinto Halim being done by [employer].  His platelet count has started going down which has been watched carefully.  He was previously on Eliquis started by his cardiologist at [facility] but used to forget the second dose hence he was switched to  Xarelto which she takes in the morning with a protein shake only.  Starting it fairly well with only minor bruising and no bleeding.  He has also been switched to Repatha and Crestor dose has been reduced from 40 mg to 5 mg daily.  His last lipid profile had shown improvement in his lipoprotein a levels as well as lipid profile but it is still not in the normal range.  He had one episode of panic attack recently.  He had obtained substantial benefit in the past with cognitive behavioral therapy and he plans to call his therapist and see him soon.  ROS:   14 system  review of systems is positive for  numbness, tingling, bruising and all other systems negative  PMH:  Past Medical History:  Diagnosis Date  . Anxiety   . CVA (cerebral vascular accident) [facility](HCC)     Social History:  Social History   Socioeconomic History  . Marital status: Single    Spouse name: Not on file  . Number of children: Not on file  . Years of education: Not on file  . Highest education level: Not on file  Occupational History  . Not on file  Tobacco Use  . Smoking status: Former Smoker    Years: 20.00    Types: Cigarettes    Quit date: 01/15/2016    Years since quitting: 3.7  . Smokeless tobacco: Former NeurosurgeonUser    Types: Engineer, drillingChew  Vaping Use  . Vaping Use: Never used  Substance and Sexual Activity  . Alcohol use: Yes    Alcohol/week: 10.0 standard drinks    Types: 10 Cans of beer per week    Comment: social 10  beers per week  . Drug use: No  . Sexual activity: Yes  Other Topics Concern  . Not on file  Social History Narrative  . Not on file   Social Determinants of Health   Financial Resource Strain:   . Difficulty of Paying Living Expenses:   Food Insecurity:   . Worried About Programme researcher, broadcasting/film/videounning Out of Food in the Last Year:   . Baristaan Out of Food in the Last Year:   Transportation Needs:   . Freight forwarderLack of Transportation (Medical):   Marland Kitchen. Lack of Transportation (Non-Medical):   Physical Activity:   . Days of Exercise per Week:   . Minutes of Exercise per Session:   Stress:   . Feeling of Stress :   Social Connections:   . Frequency of Communication with Friends and Family:   . Frequency of Social Gatherings with Friends and Family:   . Attends Religious Services:   . Active Member of Clubs or Organizations:   . Attends BankerClub or Organization Meetings:   Marland Kitchen. Marital Status:   Intimate Partner Violence:   . Fear of Current or Ex-Partner:   . Emotionally Abused:   Marland Kitchen. Physically Abused:   . Sexually Abused:     Medications:   Current Outpatient Medications on File Prior to  Visit  Medication Sig Dispense Refill  . aspirin EC 81 MG tablet Take 81 mg by mouth daily.    Marland Kitchen. REPATHA SURECLICK 140 MG/ML SOAJ INJECT 1 PEN INTO SKIN EVERY 14 DAYS 2 mL 11  . rivaroxaban (XARELTO) 20 MG TABS tablet Take 1 tablet (20 mg total) by mouth daily with supper. 90 tablet 3  . rosuvastatin (CRESTOR) 10 MG tablet Take by mouth.    . rosuvastatin (CRESTOR) 40 MG tablet Take 1 tablet (40 mg total) by  mouth daily. (Patient taking differently: Take 5 mg by mouth daily. ) 90 tablet 3  . SYNTHROID 50 MCG tablet Take 50 mcg by mouth daily.     No current facility-administered medications on file prior to visit.    Allergies:   Allergies  Allergen Reactions  . Penicillins Hives    Has patient had a PCN reaction causing immediate rash, facial/tongue/throat swelling, SOB or lightheadedness with hypotension: YES Has patient had a PCN reaction causing severe rash involving mucus membranes or skin necrosis: NO Has patient had a PCN reaction that required hospitalizationNO Has patient had a PCN reaction occurring within the last 10 years: NO If all of the above answers are "NO", then may proceed with Cephalosporin use. Has patient had a PCN reaction causing immediate rash, facial/tongue/throat swelling, SOB or lightheadedness with hypotension: YES Has patient had a PCN reaction causing severe rash involving mucus membranes or skin necrosis: NO Has patient had a PCN reaction that required hospitalizationNO Has patient had a PCN reaction occurring within the last 10 years: NO If all of the above answers are "NO", then may proceed with Cephalosporin use.    Physical Exam General: well developed, well nourished young Caucasian male, seated, in no evident distress.  Appears mildly anxious. Head: head normocephalic and atraumatic.  Neck: supple with no carotid or supraclavicular bruits Cardiovascular: regular rate and rhythm, no murmurs Musculoskeletal: no deformity Skin:  no  rash/petichiae Vascular:  Normal pulses all extremities Vitals:   10/19/19 1520  BP: 113/77  Pulse: 82   Neurologic Exam Mental Status: Awake and fully alert. Oriented to place and time. Recent and remote memory intact. Attention span, concentration and fund of knowledge appropriate. Mood and affect appropriate.  Cranial Nerves: Fundoscopic exam not done. Pupils equal, briskly reactive to light. Extraocular movements full without nystagmus. Visual fields full to confrontation. Hearing intact. Facial sensation diminished on the left side Face, tongue, palate moves normally and symmetrically.  Motor: Normal bulk and tone. Normal strength in all tested extremity muscles. Sensory.: intact to touch ,pinprick .position and vibratory sensation except mild hyperesthesia over the right lower face..  Coordination: Rapid alternating movements normal in all extremities. Finger-to-nose and heel-to-shin performed accurately bilaterally. Gait and Station: Arises from chair without difficulty. Stance is normal. Gait demonstrates normal stride length and balance . Able to heel, toe and tandem walk without difficulty.  Reflexes: 1+ and symmetric. Toes downgoing.   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 Vascular risk factors of small PFO and hyperlipidemia only. His small PFO is likely a innocent bystander Family history of elevated lipoprotein a. He recent worsening of his poststroke paresthesias as well as bilateral hand tingling intermittently likely related to underlying stress and anxiety from recent medication changes, work stress and his dieting and exercise PLAN: I had a long discussion with the patient regarding his poor stroke paresthesias as well as hand paresthesias which are likely result of  underlying anxiety and stress due to a lot of things going on in his life.  I advised him to increase participation stress laxation techniques like meditation and yoga and regular exercise.  He was advised to continue Xarelto for stroke prevention but to take it with the largest meal of the day consistently at the same time every day.  Continue Repatha and Crestor for his hyperlipidemia and  participation in [employer] lipoprotein a trial with Dr. Jacinto Halim.  He will return for follow-up in the future only as necessary and no schedule appointment was made. Greater than 50% of time during this 30 minute visit was spent on counseling,explanation of diagnosis vertebral artery occlusion, brainstem stroke, paresthesias and tingling, planning of further management, discussion with patient and family and coordination of care Delia Heady, MD  [facility] [address] [place], [place] [address]  Phone [phone] Fax [phone] Note: This document was prepared with digital dictation and possible smart phrase technology. Any transcriptional errors that result from this process are unintentional

## 2019-10-25 MED ORDER — ROSUVASTATIN 10 MG TABLET
ORAL_TABLET | 0 refills | 0 days
Start: 2019-10-25 — End: ?

## 2019-10-26 MED ORDER — ROSUVASTATIN 10 MG TABLET
ORAL_TABLET | ORAL | 0 refills | 0.00000 days | Status: CP
Start: 2019-10-26 — End: ?

## 2019-10-29 ENCOUNTER — Emergency Department (HOSPITAL_COMMUNITY): Payer: BC Managed Care – PPO

## 2019-10-29 ENCOUNTER — Emergency Department (HOSPITAL_COMMUNITY)
Admission: EM | Admit: 2019-10-29 | Discharge: 2019-10-29 | Disposition: A | Discharge status: Home / Self Care | Payer: BC Managed Care – PPO | Attending: Emergency Medicine | Admitting: Emergency Medicine

## 2019-10-29 ENCOUNTER — Other Ambulatory Visit: Payer: Self-pay

## 2019-10-29 DIAGNOSIS — Z7901 Long term (current) use of anticoagulants: Secondary | ICD-10-CM | POA: Insufficient documentation

## 2019-10-29 DIAGNOSIS — F419 Anxiety disorder, unspecified: Secondary | ICD-10-CM | POA: Insufficient documentation

## 2019-10-29 DIAGNOSIS — Z79899 Other long term (current) drug therapy: Secondary | ICD-10-CM | POA: Diagnosis not present

## 2019-10-29 DIAGNOSIS — Z8673 Personal history of transient ischemic attack (TIA), and cerebral infarction without residual deficits: Secondary | ICD-10-CM | POA: Insufficient documentation

## 2019-10-29 DIAGNOSIS — R55 Syncope and collapse: Secondary | ICD-10-CM | POA: Diagnosis not present

## 2019-10-29 DIAGNOSIS — R42 Dizziness and giddiness: Secondary | ICD-10-CM

## 2019-10-29 LAB — I-STAT CHEM 8, ED
BUN: 11 mg/dL (ref 6–20)
Calcium, Ion: 1.2 mmol/L (ref 1.15–1.40)
Chloride: 108 mmol/L (ref 98–111)
Creatinine, Ser: 0.8 mg/dL (ref 0.61–1.24)
Glucose, Bld: 96 mg/dL (ref 70–99)
HCT: 45 % (ref 39.0–52.0)
Hemoglobin: 15.3 g/dL (ref 13.0–17.0)
Potassium: 3.9 mmol/L (ref 3.5–5.1)
Sodium: 144 mmol/L (ref 135–145)
TCO2: 20 mmol/L — ABNORMAL LOW (ref 22–32)

## 2019-10-29 LAB — COMPREHENSIVE METABOLIC PANEL
ALT: 64 U/L — ABNORMAL HIGH (ref 0–44)
AST: 50 U/L — ABNORMAL HIGH (ref 15–41)
Albumin: 4.3 g/dL (ref 3.5–5.0)
Alkaline Phosphatase: 67 U/L (ref 38–126)
Anion gap: 12 (ref 5–15)
BUN: 10 mg/dL (ref 6–20)
CO2: 21 mmol/L — ABNORMAL LOW (ref 22–32)
Calcium: 9.4 mg/dL (ref 8.9–10.3)
Chloride: 110 mmol/L (ref 98–111)
Creatinine, Ser: 0.85 mg/dL (ref 0.61–1.24)
GFR calc Af Amer: >60 mL/min (ref >60)
GFR calc non Af Amer: >60 mL/min (ref >60)
Glucose, Bld: 102 mg/dL — ABNORMAL HIGH (ref 70–99)
Potassium: 3.9 mmol/L (ref 3.5–5.1)
Sodium: 143 mmol/L (ref 135–145)
Total Bilirubin: 0.6 mg/dL (ref 0.3–1.2)
Total Protein: 7.1 g/dL (ref 6.5–8.1)

## 2019-10-29 LAB — DIFFERENTIAL
Abs Immature Granulocytes: 0.01 10*3/uL (ref 0.00–0.07)
Basophils Absolute: 0 10*3/uL (ref 0.0–0.1)
Basophils Relative: 1 %
Eosinophils Absolute: 0.1 10*3/uL (ref 0.0–0.5)
Eosinophils Relative: 2 %
Immature Granulocytes: 0 %
Lymphocytes Relative: 25 %
Lymphs Abs: 1 10*3/uL (ref 0.7–4.0)
Monocytes Absolute: 0.5 10*3/uL (ref 0.1–1.0)
Monocytes Relative: 14 %
Neutro Abs: 2.2 10*3/uL (ref 1.7–7.7)
Neutrophils Relative %: 58 %

## 2019-10-29 LAB — CBC
HCT: 44.9 % (ref 39.0–52.0)
Hemoglobin: 15.1 g/dL (ref 13.0–17.0)
MCH: 31.9 pg (ref 26.0–34.0)
MCHC: 33.6 g/dL (ref 30.0–36.0)
MCV: 94.9 fL (ref 80.0–100.0)
Platelets: 155 10*3/uL (ref 150–400)
RBC: 4.73 MIL/uL (ref 4.22–5.81)
RDW: 12.2 % (ref 11.5–15.5)
WBC: 3.8 10*3/uL — ABNORMAL LOW (ref 4.0–10.5)
nRBC: 0 % (ref 0.0–0.2)

## 2019-10-29 LAB — PROTIME-INR
INR: 1.5 — ABNORMAL HIGH (ref 0.8–1.2)
Prothrombin Time: 17.1 seconds — ABNORMAL HIGH (ref 11.4–15.2)

## 2019-10-29 LAB — APTT: aPTT: 30 seconds (ref 24–36)

## 2019-10-29 MED ORDER — DIPHENHYDRAMINE HCL 50 MG/ML IJ SOLN
25.0000 mg | Freq: Once | INTRAMUSCULAR | Status: AC
  Administered 2019-10-29: 25 mg via INTRAVENOUS
  Filled 2019-10-29: qty 1

## 2019-10-29 MED ORDER — LORAZEPAM 1 MG PO TABS: 0.0000 mg | ORAL_TABLET | Freq: Two times a day (BID) | ORAL | Status: DC

## 2019-10-29 MED ORDER — LORAZEPAM 2 MG/ML IJ SOLN: 0.0000 mg | Freq: Two times a day (BID) | INTRAMUSCULAR | Status: DC

## 2019-10-29 MED ORDER — LORAZEPAM 1 MG PO TABS: 0.0000 mg | ORAL_TABLET | Freq: Four times a day (QID) | ORAL | Status: DC

## 2019-10-29 MED ORDER — SODIUM CHLORIDE 0.9 % IV BOLUS
1000.0000 mL | Freq: Once | INTRAVENOUS | Status: AC
  Administered 2019-10-29: 1000 mL via INTRAVENOUS

## 2019-10-29 MED ORDER — THIAMINE HCL 100 MG PO TABS
100.0000 mg | ORAL_TABLET | Freq: Every day | ORAL | Status: DC
  Filled 2019-10-29: qty 1

## 2019-10-29 MED ORDER — THIAMINE HCL 100 MG/ML IJ SOLN
100.0000 mg | Freq: Every day | INTRAMUSCULAR | Status: DC
  Filled 2019-10-29: qty 2

## 2019-10-29 MED ORDER — SODIUM CHLORIDE 0.9% FLUSH
3.0000 mL | Freq: Once | INTRAVENOUS | Status: AC
  Administered 2019-10-29: 3 mL via INTRAVENOUS

## 2019-10-29 MED ORDER — LORAZEPAM 2 MG/ML IJ SOLN: 0.0000 mg | Freq: Four times a day (QID) | INTRAMUSCULAR | Status: DC

## 2019-10-29 MED ORDER — IOHEXOL 350 MG/ML SOLN
100.0000 mL | Freq: Once | INTRAVENOUS | Status: AC | PRN
  Administered 2019-10-29: 100 mL via INTRAVENOUS

## 2019-10-29 NOTE — ED Provider Notes (Signed)
[facility] EMERGENCY DEPARTMENT Provider Note   CSN: [id number] Arrival date & time: 10/29/19  1258     History No chief complaint on file.   Jake Moore is a 44 y.o. male.  HPI      Jake Moore is a 44 y.o. male, with a history of CVA, anxiety, presenting to the ED with concern for an episode of lightheadedness yesterday. Patient states he was doing a intense workout, had no problems during the workout, finish the workout, and short while later began to feel lightheaded and weak.  He sat down, rested, drank some juice.  This made him feel better and then his symptoms resolved.  He states he has had a few episodes of lightheadedness over the past couple weeks.  Some of these, he associates with his anxiety.  He also states most of them seem to be around times when he has not been eating or drinking as much as he should.  He had been on the [employer] for 3 months.  In short, this diet seems to be a caloric restriction diet where you eat designated prepackaged foods and nutrition bars.  You are also not allowed to work out due to the significant lack of calories associated with your diet. Patient states he lost 25 pounds during the diet.  He stopped this diet about 2 weeks ago and also resumed working out.  He estimates he drinks about 4 glasses of wine a night, but seems to have difficulty coming up with this number.  He summarized that by stating, "I probably drink more than I should."  Denies drug use or use of supplements.  Denies recent illness, fever/chills, weakness, numbness, chest pain, shortness of breath, cough, abdominal pain, N/V/D, or any other complaints.  Patient adds he is in a clinical trial exploring lipoprotein A and its association with his stroke.  He is taking pain medication categorized as TQJ230.    Past Medical History:  Diagnosis Date  . Anxiety   . CVA (cerebral vascular accident) [facility])     Patient Active Problem List     Diagnosis Date Noted  . Dizziness 05/29/2017  . AKI (acute kidney injury) (HCC)   . Blood glucose elevated   . Generalized anxiety disorder   . Transaminitis   . CVA (cerebral vascular accident) (HCC) 04/17/2017  . 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 Altered sensation due to recent stroke   . Anxiety state   . Attention deficit disorder   . Hypokalemia   . Stroke, Wallenberg's syndrome   . Stroke (HCC) 04/11/2017  . Hyperlipidemia 04/11/2017  . 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 Anxiety     Past Surgical History:  Procedure Laterality Date  . TEE WITHOUT CARDIOVERSION N/A 04/14/2017   Procedure: TRANSESOPHAGEAL ECHOCARDIOGRAM (TEE);  Surgeon: Dolores Patty, MD;  Location: [facility] ENDOSCOPY;  Service: Cardiovascular;  Laterality: N/A;       Family History  Problem Relation Age of Onset  . Other Sister        hypercoagulable state with SCAD  . CAD Maternal Grandfather   . Sleep apnea Father     Social History   Tobacco Use  . Smoking status: Former Smoker    Years: 20.00    Types: Cigarettes    Quit date: 01/15/2016    Years since quitting: 3.7  . Smokeless tobacco: Former Neurosurgeon    Types: Engineer, drilling  . Vaping Use:  Never used  Substance Use Topics  . Alcohol use: Yes    Alcohol/week: 10.0 standard drinks    Types: 10 Cans of beer per week    Comment: social 10  beers per week  . Drug use: No    Home Medications Prior to Admission medications   Medication Sig Start Date End Date Taking? Authorizing Provider  REPATHA SURECLICK 140 MG/ML SOAJ INJECT 1 PEN INTO SKIN EVERY 14 DAYS Patient taking differently: Inject 1 pen into the skin every 14 (fourteen) days.  06/23/19  Yes Laurey Morale, MD  rivaroxaban (XARELTO) 20 MG TABS tablet Take 1 tablet (20 mg total) by mouth daily with supper. Patient taking differently: Take 20 mg by mouth daily.  07/29/19  Yes Hilty, Lisette Abu, MD  rosuvastatin (CRESTOR) 10 MG tablet Take 10 mg by mouth  daily.  09/21/19  Yes [provider]  SYNTHROID 50 MCG tablet Take 50 mcg by mouth daily. 04/05/19  Yes [provider]  Vilazodone HCl (VIIBRYD) 20 MG TABS Take 20 mg by mouth at bedtime.   Yes [provider]    Allergies    Penicillins  Review of Systems   Review of Systems  Constitutional: Negative for chills, diaphoresis and fever.  Respiratory: Negative for cough and shortness of breath.   Cardiovascular: Negative for chest pain.  Gastrointestinal: Negative for abdominal pain, diarrhea, nausea and vomiting.  Neurological: Positive for light-headedness (None currently). Negative for syncope, weakness, numbness and headaches.  Psychiatric/Behavioral: Negative for confusion.  All other systems reviewed and are negative.   Physical Exam Updated Vital Signs BP 128/88 (BP Location: Left Arm)   Pulse 98   Temp 98.8 F (37.1 C) (Oral)   Resp 18   SpO2 98%   Physical Exam Vitals and nursing note reviewed.  Constitutional:      General: He is not in acute distress.    Appearance: He is well-developed. He is not diaphoretic.  HENT:     Head: Normocephalic and atraumatic.     Mouth/Throat:     Mouth: Mucous membranes are moist.     Pharynx: Oropharynx is clear.  Eyes:     Conjunctiva/sclera: Conjunctivae normal.  Cardiovascular:     Rate and Rhythm: Normal rate and regular rhythm.     Pulses: Normal pulses.          Radial pulses are 2+ on the right side and 2+ on the left side.       Posterior tibial pulses are 2+ on the right side and 2+ on the left side.     Heart sounds: Normal heart sounds.     Comments: Tactile temperature in the extremities appropriate and equal bilaterally. Pulmonary:     Effort: Pulmonary effort is normal. No respiratory distress.     Breath sounds: Normal breath sounds.  Abdominal:     Palpations: Abdomen is soft.     Tenderness: There is no abdominal tenderness. There is no guarding.  Musculoskeletal:     Cervical  back: Neck supple.     Right lower leg: No edema.     Left lower leg: No edema.  Lymphadenopathy:     Cervical: No cervical adenopathy.  Skin:    General: Skin is warm and dry.  Neurological:     Mental Status: He is alert.     Comments: No noted acute cognitive deficit. Sensation grossly intact to light touch in the extremities.   Grip strengths equal bilaterally.   Strength 5/5  in all extremities.  No gait disturbance.  Coordination intact.  Cranial nerves III-XII grossly intact.  Handles oral secretions without noted difficulty.  No noted phonation or speech deficit. No facial droop.   Psychiatric:        Mood and Affect: Mood and affect normal.        Speech: Speech normal.        Behavior: Behavior normal.     ED Results / Procedures / Treatments   Labs (all labs ordered are listed, but only abnormal results are displayed) Labs Reviewed  PROTIME-INR - Abnormal; Notable for the following components:      Result Value   Prothrombin Time 17.1 (*)    INR 1.5 (*)    All other components within normal limits  CBC - Abnormal; Notable for the following components:   WBC 3.8 (*)    All other components within normal limits  COMPREHENSIVE METABOLIC PANEL - Abnormal; Notable for the following components:   CO2 21 (*)    Glucose, Bld 102 (*)    AST 50 (*)    ALT 64 (*)    All other components within normal limits  I-STAT CHEM 8, ED - Abnormal; Notable for the following components:   TCO2 20 (*)    All other components within normal limits  APTT  DIFFERENTIAL  CBG MONITORING, ED    EKG EKG Interpretation  Date/Time:  Friday October 29 2019 13:05:55 EDT Ventricular Rate:  88 PR Interval:  136 QRS Duration: 76 QT Interval:  374 QTC Calculation: 452 R Axis:   50 Text Interpretation: Normal sinus rhythm with sinus arrhythmia Normal ECG Confirmed by Bethann Berkshire [phone]) on 10/29/2019 2:54:38 PM   Radiology CT Angio Head W or Wo Contrast  Result Date:  10/29/2019 CLINICAL DATA:  Pre syncopal episode. History of posterior circulation abnormality. Right vertebral dissection or thrombosis. EXAM: CT ANGIOGRAPHY HEAD AND NECK TECHNIQUE: Multidetector CT imaging of the head and neck was performed using the standard protocol during bolus administration of intravenous contrast. Multiplanar CT image reconstructions and MIPs were obtained to evaluate the vascular anatomy. Carotid stenosis measurements (when applicable) are obtained utilizing NASCET criteria, using the distal internal carotid diameter as the denominator. CONTRAST:  OMNIPAQUE IOHEXOL 350 MG/ML SOLN COMPARISON:  MRI 05/29/2017. CT angiography 05/29/2017. CT angiography 04/12/2017 FINDINGS: CT HEAD FINDINGS Brain: Normal appearance of the brain without evidence of old or acute infarction, mass lesion, hemorrhage, hydrocephalus or extra-axial collection. Known old infarction of the inferior cerebellum on the right not appreciable by CT. Vascular: No significant vascular finding on the noncontrast head CT portion. Skull: Negative Sinuses: Clear Orbits: Normal Review of the MIP images confirms the above findings CTA NECK FINDINGS Aortic arch: Normal Right carotid system: Common carotid artery widely patent the bifurcation. Carotid bifurcation normal without atherosclerotic disease. Cervical ICA is normal. Left carotid system: Common carotid artery widely patent to the bifurcation. Carotid bifurcation is normal. Cervical ICA is normal. Vertebral arteries: Dominant left vertebral artery widely patent at the origin and through the cervical region to the foramen magnum. Non dominant right vertebral artery widely patent at the origin and through the cervical region to the foramen magnum. I do not see distinct irregularity at the C3 level on today's study. Skeleton: Negative Other neck: Normal Upper chest: Normal Review of the MIP images confirms the above findings CTA HEAD FINDINGS Anterior circulation: Both  internal carotid arteries are widely patent through the skull base and siphon regions. The anterior and  middle cerebral vessels are normal without proximal stenosis, aneurysm or vascular malformation. No large or medium vessel occlusion. Posterior circulation: Dominant left vertebral artery is widely patent to the basilar. No basilar stenosis. Non dominant right vertebral artery terminates in PICA. On the previous MRI, there was susceptibility artifact distal to PICA with suggested that the distal right vertebral artery could have been thrombosed. The appearance is no different on today's CT angiogram. Flow is present in both superior cerebellar and posterior cerebral arteries. Venous sinuses: Patent and normal. Anatomic variants: None significant otherwise. Review of the MIP images confirms the above findings IMPRESSION: No acute finding today. Normal appearance of the brain itself. Known old infarction of the inferior cerebellum on the right not appreciable by CT. The appearance of the right vertebral artery is unchanged since 2019. There is no evidence of acute dissection or occlusion. The right vertebral artery terminates in PICA, with no flow visible beyond that to the basilar. 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 Electronically Signed   By: Paulina Fusi M.D.   On: 10/29/2019 19:36   CT Angio Neck W and/or Wo Contrast  Result Date: 10/29/2019 CLINICAL DATA:  Pre syncopal episode. History of posterior circulation abnormality. Right vertebral dissection or thrombosis. EXAM: CT ANGIOGRAPHY HEAD AND NECK TECHNIQUE: Multidetector CT imaging of the head and neck was performed using the standard protocol during bolus administration of intravenous contrast. Multiplanar CT image reconstructions and MIPs were obtained to evaluate the vascular anatomy. Carotid stenosis measurements (when applicable) are obtained utilizing NASCET criteria, using the distal internal carotid diameter as the  denominator. CONTRAST:  OMNIPAQUE IOHEXOL 350 MG/ML SOLN COMPARISON:  MRI 05/29/2017. CT angiography 05/29/2017. CT angiography 04/12/2017 FINDINGS: CT HEAD FINDINGS Brain: Normal appearance of the brain without evidence of old or acute infarction, mass lesion, hemorrhage, hydrocephalus or extra-axial collection. Known old infarction of the inferior cerebellum on the right not appreciable by CT. Vascular: No significant vascular finding on the noncontrast head CT portion. Skull: Negative Sinuses: Clear Orbits: Normal Review of the MIP images confirms the above findings CTA NECK FINDINGS Aortic arch: Normal Right carotid system: Common carotid artery widely patent the bifurcation. Carotid bifurcation normal without atherosclerotic disease. Cervical ICA is normal. Left carotid system: Common carotid artery widely patent to the bifurcation. Carotid bifurcation is normal. Cervical ICA is normal. Vertebral arteries: Dominant left vertebral artery widely patent at the origin and through the cervical region to the foramen magnum. Non dominant right vertebral artery widely patent at the origin and through the cervical region to the foramen magnum. I do not see distinct irregularity at the C3 level on today's study. Skeleton: Negative Other neck: Normal Upper chest: Normal Review of the MIP images confirms the above findings CTA HEAD FINDINGS Anterior circulation: Both internal carotid arteries are widely patent through the skull base and siphon regions. The anterior and middle cerebral vessels are normal without proximal stenosis, aneurysm or vascular malformation. No large or medium vessel occlusion. Posterior circulation: Dominant left vertebral artery is widely patent to the basilar. No basilar stenosis. Non dominant right vertebral artery terminates in PICA. On the previous MRI, there was susceptibility artifact distal to PICA with suggested that the distal right vertebral artery could have been thrombosed. The  appearance is no different on today's CT angiogram. Flow is present in both superior cerebellar and posterior cerebral arteries. Venous sinuses: Patent and normal. Anatomic variants: None significant otherwise. Review of the MIP images confirms the above findings IMPRESSION: No  acute finding today. Normal appearance of the brain itself. Known old infarction of the inferior cerebellum on the right not appreciable by CT. The appearance of the right vertebral artery is unchanged since 2019. There is no evidence of acute dissection or occlusion. The right vertebral artery terminates in PICA, with no flow visible beyond that to the basilar. 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 Electronically Signed   By: Paulina Fusi M.D.   On: 10/29/2019 19:36    Procedures Procedures (including critical care time)  Medications Ordered in ED Medications  LORazepam (ATIVAN) injection 0-4 mg (0 mg Intravenous Refused 10/29/19 2020)    Or  LORazepam (ATIVAN) tablet 0-4 mg ( Oral See Alternative 10/29/19 2020)  LORazepam (ATIVAN) injection 0-4 mg (has no administration in time range)    Or  LORazepam (ATIVAN) tablet 0-4 mg (has no administration in time range)  thiamine tablet 100 mg (100 mg Oral Refused 10/29/19 2019)    Or  thiamine (B-1) injection 100 mg ( Intravenous See Alternative 10/29/19 2019)  sodium chloride flush (NS) 0.9 % injection 3 mL (3 mLs Intravenous Given 10/29/19 2101)  iohexol (OMNIPAQUE) 350 MG/ML injection 100 mL (100 mLs Intravenous Contrast Given 10/29/19 1901)  sodium chloride 0.9 % bolus 1,000 mL (0 mLs Intravenous Stopped 10/29/19 2131)  diphenhydrAMINE (BENADRYL) injection 25 mg (25 mg Intravenous Given 10/29/19 1957)    ED Course  I have reviewed the triage vital signs and the nursing notes.  Pertinent labs & imaging results that were available during my care of the patient were reviewed by me and considered in my medical decision making (see chart for details).  Clinical  Course as of Oct 29 2354  Fri Oct 29, 2019  1537 Spoke with Dr. Amada Jupiter, neurologist.  We discussed the patient's symptoms and his past medical history.  Dr. Amada Jupiter reviewed the patient's previous imaging. Recommends obtaining CTA head and neck to assure no changes in the patient's circulation, especially in the posterior circulation.   [SJ]  1930 After returning from CT, patient states the IV contrast made him feel as though he was wetting himself as well as burning and tingling all over.  This sensation made him uncomfortable and therefore made him anxious. I talked through this sensation with the patient. I also reexamined him. His blood pressure was noted to be elevated.  No tachycardia.  He does appear to be anxious. No hives or skin flushing.  Patient is lying in a semirecline position with no noted phonation abnormality or airway compromise.  No swelling noted.  No confusion or GI upset. Due to the patient's amount of alcohol intake, onset of withdrawal symptoms was also considered.  Patient does acknowledge it would be around this time when he would start to consume alcohol for the evening.  He usually starts to have anxiety build up in the early evening and this resolves with alcohol intake.   [SJ]  2102 Patient states he feels much better. I had put in for the patient to have a CIWA assessment.  This was performed, however, patient was feeling better and therefore declined the Ativan.   [SJ]    Clinical Course User Index [SJ] Acire Tang, Hillard Danker, PA-C   MDM Rules/Calculators/A&P                          Patient presents with feelings of lightheadedness, especially an event that occurred yesterday. Patient is nontoxic appearing, afebrile, not tachycardic,  not tachypneic, not hypotensive, maintains excellent SPO2 on room air, and is in no apparent distress.   I have reviewed the patient's chart to obtain more information.   I reviewed and interpreted the patient's labs and  radiological studies. Mild leukopenia.  He will have this reevaluated and retested through his PCP. Slight elevation in AST and ALT.  Transaminitis was also listed in patient's medical history.  He will follow-up on this with his PCP. No acute abnormalities noted on CTAs.  Advised to follow-up with PCP and neurology. The patient was given instructions for home care as well as return precautions. Patient voices understanding of these instructions, accepts the plan, and is comfortable with discharge.    Findings and plan of care discussed with Bethann BerkshireJoseph Zammit, MD.   Vitals:   10/29/19 1952 10/29/19 2045 10/29/19 2128 10/29/19 2132  BP: (!) 125/92 110/81 115/78   Pulse: 73 79 88   Resp:  11 17   Temp:    98.7 F (37.1 C)  TempSrc:    Oral  SpO2:  92% 97%    Orthostatic VS for the past 24 hrs:  BP- Lying Pulse- Lying BP- Sitting Pulse- Sitting BP- Standing at 0 minutes Pulse- Standing at 0 minutes  10/29/19 1419 111/78 70 (!) 116/95 78 (!) 119/102 92     Final Clinical Impression(s) / ED Diagnoses Final diagnoses:  Lightheadedness  Anxiousness    Rx / DC Orders ED Discharge Orders    None       Concepcion LivingJoy, Lido Maske C, PA-C 10/29/19 2358    Bethann BerkshireZammit, Joseph, MD 11/01/19 1008

## 2019-10-29 NOTE — ED Triage Notes (Signed)
Pt here from  home with c/o of 2 near syncopal episodes , once while pt was working out the other while at rest , pt has had a stroke in the past

## 2019-10-29 NOTE — Discharge Instructions (Signed)
Work-up today was reassuring. There are some things you will need to follow-up on with your primary care provider.  The white blood cell count was slightly lower than normal.  Two of the liver labs were slightly elevated.  These will need to be retested. Return to the emergency department for persistent dizziness, chest pain, shortness of breath, abdominal pain, loss in function of an extremity, or any other major concerns.

## 2019-11-09 ENCOUNTER — Encounter: Payer: BC Managed Care – PPO | Admitting: Cardiology

## 2019-11-10 ENCOUNTER — Encounter: Payer: BC Managed Care – PPO | Admitting: Cardiology

## 2019-11-10 DIAGNOSIS — R42 Dizziness and giddiness: Secondary | ICD-10-CM | POA: Diagnosis not present

## 2019-11-10 DIAGNOSIS — Z006 Encounter for examination for normal comparison and control in clinical research program: Secondary | ICD-10-CM

## 2019-11-10 DIAGNOSIS — R7401 Elevation of levels of liver transaminase levels: Secondary | ICD-10-CM | POA: Diagnosis not present

## 2019-11-10 DIAGNOSIS — F411 Generalized anxiety disorder: Secondary | ICD-10-CM | POA: Diagnosis not present

## 2019-11-10 DIAGNOSIS — R779 Abnormality of plasma protein, unspecified: Secondary | ICD-10-CM | POA: Diagnosis not present

## 2019-11-12 NOTE — Progress Notes (Signed)
Research visit for LPA trial.

## 2019-12-01 ENCOUNTER — Ambulatory Visit
Admit: 2019-12-01 | Payer: PRIVATE HEALTH INSURANCE | Attending: Cardiovascular Disease | Primary: Cardiovascular Disease

## 2019-12-01 DIAGNOSIS — E7800 Pure hypercholesterolemia, unspecified: Principal | ICD-10-CM

## 2019-12-06 ENCOUNTER — Encounter: Payer: BC Managed Care – PPO | Admitting: Cardiology

## 2019-12-08 ENCOUNTER — Encounter: Payer: BC Managed Care – PPO | Admitting: Cardiology

## 2019-12-09 MED ORDER — ROSUVASTATIN 10 MG TABLET
ORAL_TABLET | 0 refills | 0 days | Status: CP
Start: 2019-12-09 — End: ?

## 2020-01-01 DIAGNOSIS — F410 Panic disorder [episodic paroxysmal anxiety] without agoraphobia: Secondary | ICD-10-CM | POA: Diagnosis not present

## 2020-01-13 ENCOUNTER — Encounter: Payer: BC Managed Care – PPO | Admitting: Cardiology

## 2020-01-13 NOTE — Progress Notes (Unsigned)
Patient being screened/followed for research.  Please see protocol below.  Horizon (protocol [id number]) Description: A randomized double-blind, placebo-controlled, multicenter trialassessing the impact of lipoprotein(a) lowering with pelacarsen (TQJ230) on major cardiovascular events in patients with established cardiovascular disease.    Yates Decamp, MD, [facility] 01/13/2020, 5:38 PM Office: [phone]

## 2020-01-18 MED ORDER — ROSUVASTATIN 10 MG TABLET
ORAL_TABLET | 0 refills | 0 days
Start: 2020-01-18 — End: ?

## 2020-01-18 NOTE — Unmapped (Signed)
Darl Pikes    Left message to schedule with Lodema Hong

## 2020-01-18 NOTE — Unmapped (Signed)
Refill for Crestpr pended, has not been seen in greater than 13 months.

## 2020-01-19 MED ORDER — ROSUVASTATIN 10 MG TABLET
ORAL_TABLET | ORAL | 0 refills | 0.00000 days | Status: CP
Start: 2020-01-19 — End: ?

## 2020-02-03 ENCOUNTER — Encounter: Payer: Self-pay | Admitting: Cardiology

## 2020-02-03 ENCOUNTER — Other Ambulatory Visit: Payer: Self-pay

## 2020-02-03 DIAGNOSIS — Z006 Encounter for examination for normal comparison and control in clinical research program: Secondary | ICD-10-CM

## 2020-02-03 NOTE — Progress Notes (Signed)
HORIZON: A Phase II Study to Evaluate the Safety and Efficacy of Two Doses of GT005 General appearance: Normal 

## 2020-02-15 ENCOUNTER — Telehealth: Payer: Self-pay | Admitting: Internal Medicine

## 2020-02-15 MED ORDER — ROSUVASTATIN CALCIUM 20 MG PO TABS: 20.0000 mg | ORAL_TABLET | Freq: Every day | ORAL | 1 refills | Status: DC

## 2020-02-15 NOTE — Telephone Encounter (Signed)
PA for Repatha [employer] submitted via CMM (Key: [id number])

## 2020-02-21 NOTE — Telephone Encounter (Signed)
Repatha approved 02/15/2020 - 02/13/2021  NDCs: [id number], [id number], [id number], [id number] are covered

## 2020-04-03 DIAGNOSIS — Z1159 Encounter for screening for other viral diseases: Secondary | ICD-10-CM | POA: Diagnosis not present

## 2020-04-04 DIAGNOSIS — H43813 Vitreous degeneration, bilateral: Secondary | ICD-10-CM | POA: Diagnosis not present

## 2020-05-04 ENCOUNTER — Encounter: Payer: Self-pay | Admitting: Cardiology

## 2020-05-04 ENCOUNTER — Other Ambulatory Visit: Payer: Self-pay

## 2020-05-04 DIAGNOSIS — Z006 Encounter for examination for normal comparison and control in clinical research program: Secondary | ICD-10-CM

## 2020-05-04 DIAGNOSIS — E7841 Elevated Lipoprotein(a): Secondary | ICD-10-CM

## 2020-05-04 NOTE — Progress Notes (Signed)
HORIZON: A Phase II Study to Evaluate the Safety and Efficacy of Two Doses of GT005  General appearance: NormalPatient being screened/followed for research.  Please see protocol below.  Horizon (protocol [id number]) Description: A randomized double-blind, placebo-controlled, multicenter trialassessing the impact of lipoprotein(a) lowering with pelacarsen (TQJ230) on major cardiovascular events in patients with established cardiovascular disease.  Tolerating the medication well. Proceed with infusion.    Yates Decamp, MD, [facility] 05/04/2020, 8:50 PM Office: [phone]

## 2020-05-09 ENCOUNTER — Telehealth: Payer: Self-pay | Admitting: Internal Medicine

## 2020-05-09 DIAGNOSIS — E782 Mixed hyperlipidemia: Secondary | ICD-10-CM

## 2020-05-09 DIAGNOSIS — E7841 Elevated Lipoprotein(a): Secondary | ICD-10-CM

## 2020-05-09 NOTE — Telephone Encounter (Signed)
Lipid panel ordered -- this due + appointment approx March 2022 MyChart message reminder sent to patient

## 2020-05-17 DIAGNOSIS — E039 Hypothyroidism, unspecified: Secondary | ICD-10-CM | POA: Diagnosis not present

## 2020-05-17 DIAGNOSIS — E7841 Elevated Lipoprotein(a): Secondary | ICD-10-CM | POA: Diagnosis not present

## 2020-05-17 DIAGNOSIS — E785 Hyperlipidemia, unspecified: Secondary | ICD-10-CM | POA: Diagnosis not present

## 2020-05-17 DIAGNOSIS — Z125 Encounter for screening for malignant neoplasm of prostate: Secondary | ICD-10-CM | POA: Diagnosis not present

## 2020-05-17 DIAGNOSIS — Z0000 Encounter for general adult medical examination without abnormal findings: Secondary | ICD-10-CM | POA: Diagnosis not present

## 2020-05-18 DIAGNOSIS — Z1159 Encounter for screening for other viral diseases: Secondary | ICD-10-CM | POA: Diagnosis not present

## 2020-05-18 DIAGNOSIS — Z1152 Encounter for screening for COVID-19: Secondary | ICD-10-CM | POA: Diagnosis not present

## 2020-05-19 DIAGNOSIS — Z1159 Encounter for screening for other viral diseases: Secondary | ICD-10-CM | POA: Diagnosis not present

## 2020-05-22 DIAGNOSIS — Z1152 Encounter for screening for COVID-19: Secondary | ICD-10-CM | POA: Diagnosis not present

## 2020-05-22 DIAGNOSIS — Z1159 Encounter for screening for other viral diseases: Secondary | ICD-10-CM | POA: Diagnosis not present

## 2020-05-24 DIAGNOSIS — Z23 Encounter for immunization: Secondary | ICD-10-CM | POA: Diagnosis not present

## 2020-05-24 DIAGNOSIS — R030 Elevated blood-pressure reading, without diagnosis of hypertension: Secondary | ICD-10-CM | POA: Diagnosis not present

## 2020-05-24 DIAGNOSIS — Z0000 Encounter for general adult medical examination without abnormal findings: Secondary | ICD-10-CM | POA: Diagnosis not present

## 2020-05-24 DIAGNOSIS — R82998 Other abnormal findings in urine: Secondary | ICD-10-CM | POA: Diagnosis not present

## 2020-07-29 ENCOUNTER — Other Ambulatory Visit: Payer: Self-pay | Admitting: Internal Medicine

## 2020-07-31 NOTE — Telephone Encounter (Signed)
Prescription refill request for Xarelto received.  Indication:cva Last office visit:1/22 [place] Weight:85.6 kg Age:45 Scr:0.85 7/21 CrCl:135 67 ml/min  Prescription refilled

## 2020-08-30 ENCOUNTER — Ambulatory Visit (INDEPENDENT_AMBULATORY_CARE_PROVIDER_SITE_OTHER): Payer: BC Managed Care – PPO | Admitting: Internal Medicine

## 2020-08-30 ENCOUNTER — Encounter: Payer: Self-pay | Admitting: Internal Medicine

## 2020-08-30 ENCOUNTER — Other Ambulatory Visit: Payer: Self-pay

## 2020-08-30 ENCOUNTER — Other Ambulatory Visit: Payer: Self-pay | Admitting: Cardiology

## 2020-08-30 VITALS — BP 108/80 | HR 79 | Ht 70.0 in | Wt 196.2 lb

## 2020-08-30 DIAGNOSIS — E7841 Elevated Lipoprotein(a): Secondary | ICD-10-CM | POA: Diagnosis not present

## 2020-08-30 DIAGNOSIS — E782 Mixed hyperlipidemia: Secondary | ICD-10-CM

## 2020-08-30 DIAGNOSIS — Z8673 Personal history of transient ischemic attack (TIA), and cerebral infarction without residual deficits: Secondary | ICD-10-CM

## 2020-08-30 NOTE — Progress Notes (Signed)
LIPID CLINIC CONSULT NOTE  Chief Complaint:  Follow-up dyslipidemia  Primary Care Physician: Cleatis Polka., MD  Primary Cardiologist:  None  HPI:  Jake Moore is a 45 y.o. male who is being seen today for the evaluation of dyslipidemia at the request of Cleatis Polka., MD. Jake Moore was seen today for virtual visit.  He is a patient of Dr. Clelia Croft and has had previous work-up by my partners in cardiology including Dr. Gala Romney, Dr. Shirlee Latch (who he is friends with) and even communications with Dr. Bonnee Quin.  Jake Moore unfortunately has had a TIA/stroke which is unusual given his young age.  Family history is significant for a sister who has a hypercoagulable disorder and also I was found to have an elevated LP(a).  This led to testing for LP(a) which was significantly elevated at 385 by lab work in November 2019.  Based on this he has been on aggressive lipid management.  Currently taking rosuvastatin 40 mg daily.  He was also started on Repatha as there is evidence for LP(a) lowering with this medication.  Additionally he takes Eliquis 5 mg twice daily for stroke prevention.  During his work-up he underwent a TEE which showed a small PFO.  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  He is additionally listed as taking aspirin 81 mg daily.  Discussion with his neurologist at [facility] felt that he may do fine with just aspirin alone.  He sought out second opinions and was seen by both neurology and in the lipid clinic by Dr. Mariane Baumgarten at [facility].  Dr. Lodema Hong felt that his lipids were appropriately treated for what we have currently available.  We did discuss at some length today about several enrolling clinical trials including an antisense oligonucleotide to LP(a) which is currently in phase 3 clinical trials.   Unfortunately there are no research sites in [place].  07/08/2019  Jake Moore returns today for follow-up.  Overall he is feeling well.  He is recovered from his stroke but has not been able to get back to normal exercise.  Unfortunately has been a weight gain which is up from 179 pounds to 208 pounds over the past couple years.  This is been associated with an increase in his lipids.  LDL has gone up from 40-76 and his total cholesterol is up from 117-1 81.  He remains on Repatha, rosuvastatin 40 mg and aspirin and Eliquis.  He has had no further stroke.  He does have a very high LP(a) for which we were investigating therapy.  Recently we have been notified that local clinical trials center has a clinical study for LP(a) lowering with pelcarseran -which is an antisense to LP(a) made by [employer].  This is being coordinated by Mercy Riding, PharmD.   08/30/2020  Jake Moore is seen today for follow-up. He continues to do well on Repatha and rosuvastatin. He has not had further stroke. He had a very high LP(a).  I had referred him to medication management for a clinical trial for an antisense LP(a) inhibitor.  He has enrolled in the trial but is not clear whether he is on placebo or treatment.  Recent lipid profile from his PCP showed total cholesterol 181, HDL 66, LDL 99 and triglycerides 90.  The LDL has trended up some  but he is also had some weight gain.  PMHx:  Past Medical History:  Diagnosis Date  . Anxiety   . CVA (cerebral vascular accident) [facility])     Past Surgical History:  Procedure Laterality Date  . TEE WITHOUT CARDIOVERSION N/A 04/14/2017   Procedure: TRANSESOPHAGEAL ECHOCARDIOGRAM (TEE);  Surgeon: Dolores Patty, MD;  Location: [facility] END[facility]COPY;  Service: Cardiovascular;  Laterality: N/A;    FAMHx:  Family History  Problem Relation Age of Onset  . Other Sister        hypercoagulable state with SCAD  . CAD Maternal Grandfather   . Sleep apnea Father      SOCHx:   reports that he quit smoking about 4 years ago. His smoking use included cigarettes. He quit after 20.00 years of use. He has quit using smokeless tobacco.  His smokeless tobacco use included chew. He reports current alcohol use of about 10.0 standard drinks of alcohol per week. He reports that he does not use drugs.  ALLERGIES:  Allergies  Allergen Reactions  . Penicillins Hives    Has patient had a PCN reaction causing immediate rash, facial/tongue/throat swelling, SOB or lightheadedness with hypotension: YES Has patient had a PCN reaction causing severe rash involving mucus membranes or skin necrosis: NO Has patient had a PCN reaction that required hospitalizationNO Has patient had a PCN reaction occurring within the last 10 years: NO If all of the above answers are "NO", then may proceed with Cephalosporin use. Has patient had a PCN reaction causing immediate rash, facial/tongue/throat swelling, SOB or lightheadedness with hypotension: YES Has patient had a PCN reaction causing severe rash involving mucus membranes or skin necrosis: NO Has patient had a PCN reaction that required hospitalizationNO Has patient had a PCN reaction occurring within the last 10 years: NO If all of the above answers are "NO", then may proceed with Cephalosporin use.    ROS: Pertinent items noted in HPI and remainder of comprehensive ROS otherwise negative.  HOME MEDS: Current Outpatient Medications on File Prior to Visit  Medication Sig Dispense Refill  . REPATHA SURECLICK 140 MG/ML SOAJ INJECT 1 PEN INTO SKIN EVERY 14 DAYS (Patient taking differently: Inject 1 pen into the skin every 14 (fourteen) days.) 2 mL 11  . rosuvastatin (CRESTOR) 20 MG tablet Take 1 tablet (20 mg total) by mouth daily. 90 tablet 1  . XARELTO 20 MG TABS tablet TAKE ONE TABLET EACH DAY WITH SUPPER 90 tablet 1   No current facility-administered medications on file prior to visit.    LABS/IMAGING: No results found  for this or any previous visit (from the past 48 hour(s)). No results found.  LIPID PANEL:    Component Value Date/Time   CHOL 117 05/08/2018 0815   TRIG 71 05/08/2018 0815   HDL 63 05/08/2018 0815   CHOLHDL 1.9 05/08/2018 0815   CHOLHDL 2.0 04/12/2017 0254   VLDL 13 04/12/2017 0254   LDLCALC 40 05/08/2018 0815    WEIGHTS: Wt Readings from Last 3 Encounters:  08/30/20 196 lb 3.2 oz (89 kg)  10/19/19 188 lb 12.8 oz (85.6 kg)  07/08/19 208 lb 6.4 oz (94.5 kg)    VITALS: BP 108/80   Pulse 79   Ht 5\' 10"  (1.778 m)   Wt 196 lb 3.2 oz (89 kg)   SpO2 97%   BMI 28.15 kg/m   EXAM: Deferred  EKG: Deferred  ASSESSMENT: 1. History of stroke 2. Small PFO 3. 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 Dyslipidemia  PLAN: 1.   Jake Moore still has fairly good cholesterol control although LDL has increased.  He had a high LP(a) but is now on a clinical trial with medication management.  Other than his current medications, will continue encourage diet and exercise and weight loss to mitigate his overall risk.  Follow-up in 1 year.  Chrystie Nose, MD, [facility], FACP  [place]  [facility]  Medical Director of the Advanced Lipid Disorders &  Cardiovascular Risk Reduction Clinic Diplomate of the American Board of Clinical Lipidology Attending Cardiologist  Direct Dial: [phone]  Fax: [phone]  Website:  [email or web address] 08/30/2020, 3:54 PM

## 2020-08-30 NOTE — Patient Instructions (Signed)
Medication Instructions:  Your physician recommends that you continue on your current medications as directed. Please refer to the Current Medication list given to you today.  *If you need a refill on your cardiac medications before your next appointment, please call your pharmacy*   Lab Work: FASTING lab work to check cholesterol in 1 year   If you have labs (blood work) drawn today and your tests are completely normal, you will receive your results only by: MyChart Message (if you have MyChart) OR A paper copy in the mail If you have any lab test that is abnormal or we need to change your treatment, we will call you to review the results.   Testing/Procedures: NONE   Follow-Up: At [facility], you and your health needs are our priority.  As part of our continuing mission to provide you with exceptional heart care, we have created designated Provider Care Teams.  These Care Teams include your primary Cardiologist (physician) and Advanced Practice Providers (APPs -  Physician Assistants and Nurse Practitioners) who all work together to provide you with the care you need, when you need it.  We recommend signing up for the patient portal called "MyChart".  Sign up information is provided on this After Visit Summary.  MyChart is used to connect with patients for Virtual Visits (Telemedicine).  Patients are able to view lab/test results, encounter notes, upcoming appointments, etc.  Non-urgent messages can be sent to your provider as well.   To learn more about what you can do with MyChart, go to [email or web address].    Your next appointment:   12 month(s) - lipid clinic  The format for your next appointment:   In Person  Provider:   K. Chad Hilty, MD   Other Instructions   

## 2020-09-25 DIAGNOSIS — B349 Viral infection, unspecified: Secondary | ICD-10-CM | POA: Diagnosis not present

## 2020-09-25 DIAGNOSIS — Z20828 Contact with and (suspected) exposure to other viral communicable diseases: Secondary | ICD-10-CM | POA: Diagnosis not present

## 2020-09-25 DIAGNOSIS — J029 Acute pharyngitis, unspecified: Secondary | ICD-10-CM | POA: Diagnosis not present

## 2020-10-26 ENCOUNTER — Encounter: Payer: BC Managed Care – PPO | Admitting: Cardiology

## 2020-10-26 ENCOUNTER — Other Ambulatory Visit: Payer: Self-pay

## 2020-10-26 DIAGNOSIS — Z006 Encounter for examination for normal comparison and control in clinical research program: Secondary | ICD-10-CM

## 2020-10-26 DIAGNOSIS — E7841 Elevated Lipoprotein(a): Secondary | ICD-10-CM

## 2020-10-26 NOTE — Progress Notes (Signed)
Chief Complaint  Patient presents with   Research     ICD-10-CM   1. Research exam:  LP(a) lowering with pelcarseran -  antisense to LP(a)  Z00.6     2. Elevated Lp(a)  E78.41       Yates Decamp, MD, [facility] 10/26/2020, 12:56 PM Office: [phone] Fax: [phone] Pager: [phone]

## 2020-12-02 ENCOUNTER — Other Ambulatory Visit: Payer: Self-pay | Admitting: Internal Medicine

## 2021-01-31 ENCOUNTER — Other Ambulatory Visit: Payer: Self-pay

## 2021-01-31 ENCOUNTER — Ambulatory Visit: Payer: Self-pay | Admitting: Cardiology

## 2021-01-31 DIAGNOSIS — Z006 Encounter for examination for normal comparison and control in clinical research program: Secondary | ICD-10-CM

## 2021-01-31 NOTE — Progress Notes (Signed)
HORIZON: A Phase II Study to Evaluate the Safety and Efficacy of Two Doses of GT005 General appearance: Normal 

## 2021-02-01 ENCOUNTER — Telehealth: Payer: Self-pay | Admitting: Internal Medicine

## 2021-02-01 NOTE — Telephone Encounter (Signed)
PA for Repatha [employer] submitted via CMM (Key: [id number])

## 2021-02-06 NOTE — Telephone Encounter (Signed)
Medication approved Effective from 02/01/2021 through 01/31/2022

## 2021-02-16 ENCOUNTER — Other Ambulatory Visit: Payer: Self-pay | Admitting: Internal Medicine

## 2021-02-16 NOTE — Telephone Encounter (Signed)
Prescription refill request for Xarelto received.  Last office visit:hilty 08/30/20 Weight:89kg Age:89m Scr: 1.050 MG/ 05/17/2020 CrCl:79.9

## 2021-03-13 DIAGNOSIS — J029 Acute pharyngitis, unspecified: Secondary | ICD-10-CM | POA: Diagnosis not present

## 2021-03-13 DIAGNOSIS — U071 COVID-19: Secondary | ICD-10-CM | POA: Diagnosis not present

## 2021-03-19 DIAGNOSIS — J029 Acute pharyngitis, unspecified: Secondary | ICD-10-CM | POA: Diagnosis not present

## 2021-03-22 ENCOUNTER — Inpatient Hospital Stay (HOSPITAL_COMMUNITY): Admit: 2021-03-22 | Payer: BC Managed Care – PPO

## 2021-03-22 ENCOUNTER — Emergency Department (HOSPITAL_COMMUNITY): Payer: BC Managed Care – PPO

## 2021-03-22 ENCOUNTER — Inpatient Hospital Stay (HOSPITAL_COMMUNITY): Payer: BC Managed Care – PPO

## 2021-03-22 ENCOUNTER — Other Ambulatory Visit: Payer: Self-pay

## 2021-03-22 ENCOUNTER — Inpatient Hospital Stay (HOSPITAL_COMMUNITY)
Admission: EM | Admit: 2021-03-22 | Discharge: 2021-03-24 | DRG: 064 | Disposition: A | Discharge status: Home / Self Care | Payer: BC Managed Care – PPO | Attending: Internal Medicine | Admitting: Internal Medicine

## 2021-03-22 ENCOUNTER — Encounter (HOSPITAL_COMMUNITY): Payer: Self-pay

## 2021-03-22 DIAGNOSIS — Z7901 Long term (current) use of anticoagulants: Secondary | ICD-10-CM

## 2021-03-22 DIAGNOSIS — I7774 Dissection of vertebral artery: Secondary | ICD-10-CM | POA: Diagnosis not present

## 2021-03-22 DIAGNOSIS — I6389 Other cerebral infarction: Secondary | ICD-10-CM

## 2021-03-22 DIAGNOSIS — R29701 NIHSS score 1: Secondary | ICD-10-CM | POA: Diagnosis present

## 2021-03-22 DIAGNOSIS — E876 Hypokalemia: Secondary | ICD-10-CM | POA: Diagnosis not present

## 2021-03-22 DIAGNOSIS — E7841 Elevated Lipoprotein(a): Secondary | ICD-10-CM | POA: Diagnosis present

## 2021-03-22 DIAGNOSIS — I639 Cerebral infarction, unspecified: Principal | ICD-10-CM

## 2021-03-22 DIAGNOSIS — R42 Dizziness and giddiness: Secondary | ICD-10-CM | POA: Diagnosis not present

## 2021-03-22 DIAGNOSIS — Z8249 Family history of ischemic heart disease and other diseases of the circulatory system: Secondary | ICD-10-CM | POA: Diagnosis not present

## 2021-03-22 DIAGNOSIS — E789 Disorder of lipoprotein metabolism, unspecified: Secondary | ICD-10-CM | POA: Diagnosis not present

## 2021-03-22 DIAGNOSIS — F1010 Alcohol abuse, uncomplicated: Secondary | ICD-10-CM

## 2021-03-22 DIAGNOSIS — Z79899 Other long term (current) drug therapy: Secondary | ICD-10-CM | POA: Diagnosis not present

## 2021-03-22 DIAGNOSIS — Z884 Allergy status to anesthetic agent status: Secondary | ICD-10-CM | POA: Diagnosis not present

## 2021-03-22 DIAGNOSIS — Z79620 Long term (current) use of immunosuppressive biologic: Secondary | ICD-10-CM | POA: Diagnosis not present

## 2021-03-22 DIAGNOSIS — U071 COVID-19: Secondary | ICD-10-CM | POA: Diagnosis not present

## 2021-03-22 DIAGNOSIS — R7401 Elevation of levels of liver transaminase levels: Secondary | ICD-10-CM

## 2021-03-22 DIAGNOSIS — F411 Generalized anxiety disorder: Secondary | ICD-10-CM | POA: Diagnosis not present

## 2021-03-22 DIAGNOSIS — E785 Hyperlipidemia, unspecified: Secondary | ICD-10-CM

## 2021-03-22 DIAGNOSIS — R4781 Slurred speech: Secondary | ICD-10-CM | POA: Diagnosis not present

## 2021-03-22 DIAGNOSIS — R202 Paresthesia of skin: Secondary | ICD-10-CM | POA: Diagnosis not present

## 2021-03-22 DIAGNOSIS — Z8673 Personal history of transient ischemic attack (TIA), and cerebral infarction without residual deficits: Secondary | ICD-10-CM

## 2021-03-22 DIAGNOSIS — Z8616 Personal history of COVID-19: Secondary | ICD-10-CM | POA: Diagnosis not present

## 2021-03-22 DIAGNOSIS — I6523 Occlusion and stenosis of bilateral carotid arteries: Secondary | ICD-10-CM | POA: Diagnosis not present

## 2021-03-22 DIAGNOSIS — Z87891 Personal history of nicotine dependence: Secondary | ICD-10-CM

## 2021-03-22 DIAGNOSIS — I63443 Cerebral infarction due to embolism of bilateral cerebellar arteries: Principal | ICD-10-CM | POA: Diagnosis present

## 2021-03-22 DIAGNOSIS — I10 Essential (primary) hypertension: Secondary | ICD-10-CM | POA: Diagnosis not present

## 2021-03-22 DIAGNOSIS — I63011 Cerebral infarction due to thrombosis of right vertebral artery: Secondary | ICD-10-CM | POA: Diagnosis not present

## 2021-03-22 DIAGNOSIS — Z880 Allergy status to penicillin: Secondary | ICD-10-CM | POA: Diagnosis not present

## 2021-03-22 DIAGNOSIS — I6502 Occlusion and stenosis of left vertebral artery: Secondary | ICD-10-CM | POA: Diagnosis not present

## 2021-03-22 DIAGNOSIS — R2681 Unsteadiness on feet: Secondary | ICD-10-CM | POA: Diagnosis not present

## 2021-03-22 DIAGNOSIS — I63441 Cerebral infarction due to embolism of right cerebellar artery: Secondary | ICD-10-CM

## 2021-03-22 HISTORY — DX: Hyperlipidemia, unspecified: E78.5

## 2021-03-22 LAB — COMPREHENSIVE METABOLIC PANEL
ALT: 30 U/L (ref 0–44)
AST: 49 U/L — ABNORMAL HIGH (ref 15–41)
Albumin: 4 g/dL (ref 3.5–5.0)
Alkaline Phosphatase: 63 U/L (ref 38–126)
Anion gap: 14 (ref 5–15)
BUN: 12 mg/dL (ref 6–20)
CO2: 18 mmol/L — ABNORMAL LOW (ref 22–32)
Calcium: 8.8 mg/dL — ABNORMAL LOW (ref 8.9–10.3)
Chloride: 108 mmol/L (ref 98–111)
Creatinine, Ser: 0.97 mg/dL (ref 0.61–1.24)
GFR, Estimated: >60 mL/min (ref >60)
Glucose, Bld: 113 mg/dL — ABNORMAL HIGH (ref 70–99)
Potassium: 3.4 mmol/L — ABNORMAL LOW (ref 3.5–5.1)
Sodium: 140 mmol/L (ref 135–145)
Total Bilirubin: 0.8 mg/dL (ref 0.3–1.2)
Total Protein: 6.6 g/dL (ref 6.5–8.1)

## 2021-03-22 LAB — CBC
HCT: 43.9 % (ref 39.0–52.0)
Hemoglobin: 15.3 g/dL (ref 13.0–17.0)
MCH: 31.5 pg (ref 26.0–34.0)
MCHC: 34.9 g/dL (ref 30.0–36.0)
MCV: 90.5 fL (ref 80.0–100.0)
Platelets: 227 10*3/uL (ref 150–400)
RBC: 4.85 MIL/uL (ref 4.22–5.81)
RDW: 11.4 % — ABNORMAL LOW (ref 11.5–15.5)
WBC: 6.2 10*3/uL (ref 4.0–10.5)
nRBC: 0 % (ref 0.0–0.2)

## 2021-03-22 LAB — DIFFERENTIAL
Abs Immature Granulocytes: 0.02 10*3/uL (ref 0.00–0.07)
Basophils Absolute: 0 10*3/uL (ref 0.0–0.1)
Basophils Relative: 1 %
Eosinophils Absolute: 0.2 10*3/uL (ref 0.0–0.5)
Eosinophils Relative: 3 %
Immature Granulocytes: 0 %
Lymphocytes Relative: 44 %
Lymphs Abs: 2.8 10*3/uL (ref 0.7–4.0)
Monocytes Absolute: 0.7 10*3/uL (ref 0.1–1.0)
Monocytes Relative: 11 %
Neutro Abs: 2.6 10*3/uL (ref 1.7–7.7)
Neutrophils Relative %: 41 %

## 2021-03-22 LAB — ECHOCARDIOGRAM COMPLETE
AR max vel: 3.59 cm2
AV Area VTI: 3.21 cm2
AV Area mean vel: 3.61 cm2
AV Mean grad: 2 mmHg
AV Peak grad: 3.7 mmHg
Ao pk vel: 0.97 m/s
Area-P 1/2: 2.57 cm2
S' Lateral: 3.5 cm

## 2021-03-22 LAB — LIPID PANEL
Cholesterol: 133 mg/dL (ref 0–200)
HDL: 65 mg/dL (ref >40)
LDL Cholesterol: 59 mg/dL (ref 0–99)
Total CHOL/HDL Ratio: 2 RATIO
Triglycerides: 43 mg/dL (ref <150)
VLDL: 9 mg/dL (ref 0–40)

## 2021-03-22 LAB — I-STAT CHEM 8, ED
BUN: 12 mg/dL (ref 6–20)
Calcium, Ion: 1.11 mmol/L — ABNORMAL LOW (ref 1.15–1.40)
Chloride: 108 mmol/L (ref 98–111)
Creatinine, Ser: 0.8 mg/dL (ref 0.61–1.24)
Glucose, Bld: 110 mg/dL — ABNORMAL HIGH (ref 70–99)
HCT: 44 % (ref 39.0–52.0)
Hemoglobin: 15 g/dL (ref 13.0–17.0)
Potassium: 3.4 mmol/L — ABNORMAL LOW (ref 3.5–5.1)
Sodium: 141 mmol/L (ref 135–145)
TCO2: 18 mmol/L — ABNORMAL LOW (ref 22–32)

## 2021-03-22 LAB — URINALYSIS, ROUTINE W REFLEX MICROSCOPIC
Bilirubin Urine: NEGATIVE
Glucose, UA: NEGATIVE mg/dL
Hgb urine dipstick: NEGATIVE
Ketones, ur: 15 mg/dL — AB
Leukocytes,Ua: NEGATIVE
Nitrite: NEGATIVE
Protein, ur: NEGATIVE mg/dL
Specific Gravity, Urine: 1.01 (ref 1.005–1.030)
pH: 6.5 (ref 5.0–8.0)

## 2021-03-22 LAB — RESP PANEL BY RT-PCR (FLU A&B, COVID) ARPGX2
Influenza A by PCR: NEGATIVE
Influenza B by PCR: NEGATIVE
SARS Coronavirus 2 by RT PCR: POSITIVE — AB

## 2021-03-22 LAB — PROTIME-INR
INR: 1.2 (ref 0.8–1.2)
Prothrombin Time: 15.6 seconds — ABNORMAL HIGH (ref 11.4–15.2)

## 2021-03-22 LAB — CBG MONITORING, ED: Glucose-Capillary: 116 mg/dL — ABNORMAL HIGH (ref 70–99)

## 2021-03-22 LAB — RAPID URINE DRUG SCREEN, HOSP PERFORMED
Amphetamines: NOT DETECTED
Barbiturates: NOT DETECTED
Benzodiazepines: NOT DETECTED
Cocaine: NOT DETECTED
Opiates: NOT DETECTED
Tetrahydrocannabinol: NOT DETECTED

## 2021-03-22 LAB — HEPARIN LEVEL (UNFRACTIONATED): Heparin Unfractionated: 1.05 IU/mL — ABNORMAL HIGH (ref 0.30–0.70)

## 2021-03-22 LAB — ETHANOL: Alcohol, Ethyl (B): 20 mg/dL — ABNORMAL HIGH (ref <10)

## 2021-03-22 LAB — APTT
aPTT: 26 seconds (ref 24–36)
aPTT: 36 seconds (ref 24–36)

## 2021-03-22 LAB — HEMOGLOBIN A1C
Hgb A1c MFr Bld: 5.2 % (ref 4.8–5.6)
Mean Plasma Glucose: 102.54 mg/dL

## 2021-03-22 LAB — HIV ANTIBODY (ROUTINE TESTING W REFLEX): HIV Screen 4th Generation wRfx: NONREACTIVE

## 2021-03-22 MED ORDER — LORAZEPAM 2 MG/ML IJ SOLN
1.0000 mg | INTRAMUSCULAR | Status: DC | PRN
  Administered 2021-03-22: 2 mg via INTRAVENOUS
  Filled 2021-03-22: qty 1

## 2021-03-22 MED ORDER — ONDANSETRON HCL 4 MG/2ML IJ SOLN: 4.0000 mg | Freq: Three times a day (TID) | INTRAMUSCULAR | Status: DC | PRN

## 2021-03-22 MED ORDER — ZINC SULFATE 220 (50 ZN) MG PO CAPS
220.0000 mg | ORAL_CAPSULE | Freq: Every day | ORAL | Status: DC
  Administered 2021-03-22 – 2021-03-23 (×2): 220 mg via ORAL
  Filled 2021-03-22 (×2): qty 1

## 2021-03-22 MED ORDER — LORAZEPAM 2 MG/ML IJ SOLN: 1.0000 mg | Freq: Once | INTRAMUSCULAR | Status: AC

## 2021-03-22 MED ORDER — TRIMETHOBENZAMIDE HCL 100 MG/ML IM SOLN
200.0000 mg | Freq: Once | INTRAMUSCULAR | Status: AC
Start: 2021-03-22 — End: 2021-03-22
  Administered 2021-03-22: 200 mg via INTRAMUSCULAR
  Filled 2021-03-22: qty 2

## 2021-03-22 MED ORDER — STROKE: EARLY STAGES OF RECOVERY BOOK: Freq: Once | Status: DC

## 2021-03-22 MED ORDER — ADULT MULTIVITAMIN W/MINERALS CH
1.0000 | ORAL_TABLET | Freq: Every day | ORAL | Status: DC
  Administered 2021-03-23: 1 via ORAL
  Filled 2021-03-22 (×2): qty 1

## 2021-03-22 MED ORDER — ACETAMINOPHEN 160 MG/5ML PO SOLN: 650.0000 mg | ORAL | Status: DC | PRN

## 2021-03-22 MED ORDER — SODIUM CHLORIDE 0.9% FLUSH
3.0000 mL | Freq: Once | INTRAVENOUS | Status: AC
Start: 2021-03-22 — End: 2021-03-22
  Administered 2021-03-22: 3 mL via INTRAVENOUS

## 2021-03-22 MED ORDER — LORAZEPAM 2 MG/ML IJ SOLN
INTRAMUSCULAR | Status: AC
  Administered 2021-03-22: 1 mg via INTRAVENOUS
  Filled 2021-03-22: qty 1

## 2021-03-22 MED ORDER — IOHEXOL 350 MG/ML SOLN
60.0000 mL | Freq: Once | INTRAVENOUS | Status: AC | PRN
  Administered 2021-03-22: 60 mL via INTRAVENOUS

## 2021-03-22 MED ORDER — ACETAMINOPHEN 650 MG RE SUPP: 650.0000 mg | RECTAL | Status: DC | PRN

## 2021-03-22 MED ORDER — ONDANSETRON HCL 4 MG/2ML IJ SOLN: 4.0000 mg | Freq: Once | INTRAMUSCULAR | Status: AC

## 2021-03-22 MED ORDER — ONDANSETRON HCL 4 MG/2ML IJ SOLN
INTRAMUSCULAR | Status: AC
  Administered 2021-03-22: 4 mg via INTRAVENOUS
  Filled 2021-03-22: qty 2

## 2021-03-22 MED ORDER — THIAMINE HCL 100 MG/ML IJ SOLN
100.0000 mg | Freq: Every day | INTRAMUSCULAR | Status: DC
  Administered 2021-03-22: 100 mg via INTRAVENOUS
  Filled 2021-03-22: qty 2

## 2021-03-22 MED ORDER — THIAMINE HCL 100 MG PO TABS
100.0000 mg | ORAL_TABLET | Freq: Every day | ORAL | Status: DC
  Administered 2021-03-23: 100 mg via ORAL
  Filled 2021-03-22 (×2): qty 1

## 2021-03-22 MED ORDER — SODIUM CHLORIDE 0.9 % IV SOLN: Freq: Once | INTRAVENOUS | Status: AC

## 2021-03-22 MED ORDER — CALCIUM GLUCONATE-NACL 1-0.675 GM/50ML-% IV SOLN
1.0000 g | Freq: Once | INTRAVENOUS | Status: AC
  Administered 2021-03-22: 1000 mg via INTRAVENOUS
  Filled 2021-03-22: qty 50

## 2021-03-22 MED ORDER — LORAZEPAM 1 MG PO TABS: 1.0000 mg | ORAL_TABLET | ORAL | Status: DC | PRN

## 2021-03-22 MED ORDER — ASPIRIN EC 81 MG PO TBEC
81.0000 mg | DELAYED_RELEASE_TABLET | Freq: Every day | ORAL | Status: DC
  Administered 2021-03-22 – 2021-03-23 (×2): 81 mg via ORAL
  Filled 2021-03-22 (×3): qty 1

## 2021-03-22 MED ORDER — ROSUVASTATIN CALCIUM 20 MG PO TABS
20.0000 mg | ORAL_TABLET | Freq: Every day | ORAL | Status: DC
  Administered 2021-03-22 – 2021-03-23 (×2): 20 mg via ORAL
  Filled 2021-03-22 (×2): qty 1

## 2021-03-22 MED ORDER — HEPARIN (PORCINE) 25000 UT/250ML-% IV SOLN
1300.0000 [IU]/h | INTRAVENOUS | Status: DC
  Administered 2021-03-22: 07:00:00 1000 [IU]/h via INTRAVENOUS
  Administered 2021-03-23: 1300 [IU]/h via INTRAVENOUS
  Filled 2021-03-22 (×2): qty 250

## 2021-03-22 MED ORDER — ACETAMINOPHEN 325 MG PO TABS: 650.0000 mg | ORAL_TABLET | ORAL | Status: DC | PRN

## 2021-03-22 MED ORDER — FLUOXETINE HCL 10 MG PO CAPS
10.0000 mg | ORAL_CAPSULE | Freq: Every evening | ORAL | Status: DC
  Administered 2021-03-22 – 2021-03-23 (×2): 10 mg via ORAL
  Filled 2021-03-22 (×4): qty 1

## 2021-03-22 MED ORDER — ASCORBIC ACID 500 MG PO TABS
500.0000 mg | ORAL_TABLET | Freq: Every day | ORAL | Status: DC
  Administered 2021-03-22 – 2021-03-23 (×2): 500 mg via ORAL
  Filled 2021-03-22 (×2): qty 1

## 2021-03-22 MED ORDER — POTASSIUM CHLORIDE CRYS ER 20 MEQ PO TBCR
40.0000 meq | EXTENDED_RELEASE_TABLET | ORAL | Status: DC
  Filled 2021-03-22: qty 2

## 2021-03-22 MED ORDER — POTASSIUM CHLORIDE CRYS ER 20 MEQ PO TBCR
20.0000 meq | EXTENDED_RELEASE_TABLET | Freq: Three times a day (TID) | ORAL | Status: DC
  Filled 2021-03-22: qty 1

## 2021-03-22 MED ORDER — FOLIC ACID 1 MG PO TABS
1.0000 mg | ORAL_TABLET | Freq: Every day | ORAL | Status: DC
  Administered 2021-03-23: 1 mg via ORAL
  Filled 2021-03-22 (×2): qty 1

## 2021-03-22 MED ORDER — ALBUTEROL SULFATE HFA 108 (90 BASE) MCG/ACT IN AERS
2.0000 | INHALATION_SPRAY | Freq: Four times a day (QID) | RESPIRATORY_TRACT | Status: DC | PRN
  Filled 2021-03-22: qty 6.7

## 2021-03-22 MED ORDER — POTASSIUM CHLORIDE 20 MEQ PO PACK
20.0000 meq | PACK | Freq: Once | ORAL | Status: AC
  Administered 2021-03-22: 20 meq via ORAL
  Filled 2021-03-22: qty 1

## 2021-03-22 MED ORDER — GUAIFENESIN ER 600 MG PO TB12: 600.0000 mg | ORAL_TABLET | Freq: Two times a day (BID) | ORAL | Status: DC | PRN

## 2021-03-22 MED ORDER — POTASSIUM CITRATE-CITRIC ACID 1100-334 MG/5ML PO SOLN: 20.0000 meq | Freq: Three times a day (TID) | ORAL | Status: DC

## 2021-03-22 NOTE — Progress Notes (Signed)
2D echocardiogram completed.  03/22/2021 1:47 PM Eula Fried., MHA, RVT, RDCS, RDMS

## 2021-03-22 NOTE — ED Notes (Signed)
Pt vomiting, unable to give po meds at this time

## 2021-03-22 NOTE — TOC CAGE-AID Note (Signed)
Transition of Care [facility]) - CAGE-AID Screening   Patient Details  Name: Jake Moore MRN: [id number] Date of Birth: 27-Jul-1975  [facility] of [facility]) CM/SW Contact:    [name] C Tarpley-Carter, LCSWA Phone Number: 03/22/2021, 1:02 PM   Clinical Narrative: Pt participated in Cage-Aid.  Pt stated he does use substance and ETOH.  Pt was offered resources, due to usage of substance and ETOH.    [name], MSW, LCSW-A Pronouns:  She/Her/Hers [facility]itions [facility] Care Clinical Social Worker Direct Number:  [phone] [name]  CAGE-AID Screening:    Have You Ever Felt You Ought to Cut Down on Your Drinking or Drug Use?: Yes Have People Annoyed You By [employer] Your Drinking Or Drug Use?: No Have You Felt Bad Or Guilty About Your Drinking Or Drug Use?: No Have You Ever Had a Drink or Used Drugs First Thing In The Morning to Steady Your Nerves or to Get Rid of a Hangover?: No CAGE-AID Score: 1  Substance Abuse Education Offered: Yes  Substance abuse interventions: Transport planner

## 2021-03-22 NOTE — H&P (Addendum)
History and Physical    Jake Moore [id number] DOB: 10-04-1975 DOA: 03/22/2021  Referring MD/NP/PA: Quintella Reichert, MD PCP: Ginger Organ., MD  Patient coming from: Home via EMS  Chief Complaint: Dizziness and right-sided numbness  I have personally briefly reviewed patient's old medical records in [place]   HPI: Jake Moore is a 45 y.o. male with medical history significant of CVA, hyperlipidemia with elevated lipoprotein a, and anxiety who presented with complaints of dizziness and right-sided numbness.  Patient had woken up at 4:30 AM to let his dog out and at that time felt in his normal state of health.  After laying back down in bed he felt extremely dizzy, nauseous, and had this loud ringing in his head.  Thereafter, developed numbness sensation face all the way down to his feet on the right side of his body.  The symptoms of dizziness, nausea, and numbness symptoms are similar to his prior stroke in 03/2017 which affected the left side of his body.  Patient reports that ever since he has been on anticoagulation and took Xarelto yesterday at around 5:30 PM.  He denies missing any doses of his medication.  He had recently gone to his PCP on 11/22 and had tested positive for COVID-19.  He still reports having a mild intermittently productive cough.  Family history is significant for his sister having a heart attack at age of 67/46.  Patient says that he drinks 3-4 glasses of wine per day on average and sometimes bourbon.  Denies any history of withdrawal.  Notes that he did not drink for couple days after he had been diagnosed with COVID and had no issues.  He does incidentally note that he did recently start Prozac this week for anxiety.   ED Course: On admission into the emergency department patient was seen to be afebrile, blood pressure 122/88-139/93, and all other vital signs maintained.  Neurology was consulted and evaluated the patient.  CT scan of the head  appear to be stable with mild to moderate new ethmoid and sphenoid sinus sinus disease.  Labs significant for potassium 3.4, CO2 18, calcium 8.8, AST 9, ALT 30, INR 1.2, and alcohol level 20.  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  Patient was not a tPA candidate due to being on Xarelto.  MRI was significant for patchy neurology recommended starting heparin drip.  Patient had also received Zofran and Ativan 1 mg IV.  Review of Systems  Constitutional:  Negative for fever.  HENT:  Positive for tinnitus.   Eyes:  Negative for photophobia.  Respiratory:  Positive for cough and sputum production. Negative for shortness of breath.   Cardiovascular:  Negative for chest pain and leg swelling.  Gastrointestinal:  Positive for nausea. Negative for abdominal pain.  Genitourinary:  Negative for dysuria and hematuria.  Musculoskeletal:  Negative for falls.  Skin:  Negative for rash.  Neurological:  Positive for dizziness and sensory change. Negative for loss of consciousness.  Psychiatric/Behavioral:  Negative for memory loss. The patient is nervous/anxious.   All other systems reviewed and are negative.  Past Medical History:  Diagnosis Date   Anxiety    CVA (cerebral vascular accident) [facility])     Past Surgical History:  Procedure Laterality Date   TEE WITHOUT CARDIOVERSION N/A 04/14/2017   Procedure: TRANSESOPHAGEAL ECHOCARDIOGRAM (  TEE);  Surgeon: Dolores Patty, MD;  Location: [facility] ENDOSCOPY;  Service: Cardiovascular;  Laterality: N/A;     reports that he quit smoking about 5 years ago. His smoking use included cigarettes. He has quit using smokeless tobacco.  His smokeless tobacco use included chew. He reports current alcohol use of about 10.0 standard drinks per week. He reports that he does not use drugs.  Allergies   Allergen Reactions   Penicillins Hives    Has patient had a PCN reaction causing immediate rash, facial/tongue/throat swelling, SOB or lightheadedness with hypotension: YES Has patient had a PCN reaction causing severe rash involving mucus membranes or skin necrosis: NO Has patient had a PCN reaction that required hospitalizationNO Has patient had a PCN reaction occurring within the last 10 years: NO If all of the above answers are "NO", then may proceed with Cephalosporin use. Has patient had a PCN reaction causing immediate rash, facial/tongue/throat swelling, SOB or lightheadedness with hypotension: YES Has patient had a PCN reaction causing severe rash involving mucus membranes or skin necrosis: NO Has patient had a PCN reaction that required hospitalizationNO Has patient had a PCN reaction occurring within the last 10 years: NO If all of the above answers are "NO", then may proceed with Cephalosporin use.   Propofol     Increased blood pressure and heart rate    Family History  Problem Relation Age of Onset   Other Sister        hypercoagulable state with SCAD   CAD Maternal Grandfather    Sleep apnea Father     Prior to Admission medications   Medication Sig Start Date End Date Taking? Authorizing Provider  FLUoxetine (PROZAC) 10 MG capsule Take 10 mg by mouth every evening.   Yes [provider]  NON FORMULARY Inject 1 Dose as directed every 30 (thirty) days. Study drug   Yes [provider]  REPATHA SURECLICK 140 MG/ML SOAJ INJECT 1 PEN INTO SKIN EVERY 14 DAYS Patient taking differently: Inject 140 mg into the skin every 14 (fourteen) days. 08/31/20  Yes Laurey Morale, MD  rosuvastatin (CRESTOR) 20 MG tablet TAKE ONE TABLET DAILY Patient taking differently: Take 20 mg by mouth daily. 12/04/20  Yes Hilty, Lisette Abu, MD  XARELTO 20 MG TABS tablet TAKE ONE TABLET EACH DAY WITH SUPPER Patient taking differently: Take 20 mg by mouth daily with supper. 02/16/21   Yes Hilty, Lisette Abu, MD    Physical Exam:  Constitutional: Middle-age male currently in no acute distress able to follow commands Vitals:   03/22/21 0630 03/22/21 0645 03/22/21 0700 03/22/21 0715  BP: (!) 132/100 (!) 129/95 (!) 123/92 122/88  Pulse: 96 95 93 89  Resp: 12 17 14 15   Temp:      TempSrc:      SpO2: 94% 94% 93% 95%   Eyes: PERRL, lids and conjunctivae normal ENMT: Mucous membranes are moist. Posterior pharynx clear of any exudate or lesions. Normal dentition.  Neck: normal, supple, no masses, no thyromegaly Respiratory: clear to auscultation bilaterally, no wheezing, no crackles. Normal respiratory effort. No accessory muscle use.  Cardiovascular: Regular rate and rhythm, no murmurs / rubs / gallops. No extremity edema. 2+ pedal pulses.  Abdomen: no tenderness, no masses palpated. No hepatosplenomegaly. Bowel sounds positive.  Musculoskeletal: no clubbing / cyanosis.  Reported some tenderness palpation of the right shoulder joint where injection given for nausea Skin: no rashes, lesions, ulcers. No induration Neurologic: CN 2-12 grossly intact.  Abnormal sensation  reported on right side.  Otherwise able to move all extremities without issue. Psychiatric: Normal judgment and insight. Alert and oriented x 3.  Anxious mood.     Labs on Admission: I have personally reviewed following labs and imaging studies  CBC: Recent Labs  Lab 03/22/21 0524 03/22/21 0533  WBC 6.2  --   NEUTROABS 2.6  --   HGB 15.3 15.0  HCT 43.9 44.0  MCV 90.5  --   PLT 227  --    Basic Metabolic Panel: Recent Labs  Lab 03/22/21 0524 03/22/21 0533  NA 140 141  K 3.4* 3.4*  CL 108 108  CO2 18*  --   GLUCOSE 113* 110*  BUN 12 12  CREATININE 0.97 0.80  CALCIUM 8.8*  --    GFR: CrCl cannot be calculated (Unknown ideal weight.). Liver Function Tests: Recent Labs  Lab 03/22/21 0524  AST 49*  ALT 30  ALKPHOS 63  BILITOT 0.8  PROT 6.6  ALBUMIN 4.0   No results for input(s):  LIPASE, AMYLASE in the last 168 hours. No results for input(s): AMMONIA in the last 168 hours. Coagulation Profile: Recent Labs  Lab 03/22/21 0524  INR 1.2   Cardiac Enzymes: No results for input(s): CKTOTAL, CKMB, CKMBINDEX, TROPONINI in the last 168 hours. BNP (last 3 results) No results for input(s): PROBNP in the last 8760 hours. HbA1C: No results for input(s): HGBA1C in the last 72 hours. CBG: Recent Labs  Lab 03/22/21 0649  GLUCAP 116*   Lipid Profile: No results for input(s): CHOL, HDL, LDLCALC, TRIG, CHOLHDL, LDLDIRECT in the last 72 hours. Thyroid Function Tests: No results for input(s): TSH, T4TOTAL, FREET4, T3FREE, THYROIDAB in the last 72 hours. Anemia Panel: No results for input(s): VITAMINB12, FOLATE, FERRITIN, TIBC, IRON, RETICCTPCT in the last 72 hours. Urine analysis: No results found for: COLORURINE, APPEARANCEUR, LABSPEC, PHURINE, GLUCOSEU, HGBUR, BILIRUBINUR, KETONESUR, PROTEINUR, UROBILINOGEN, NITRITE, LEUKOCYTESUR Sepsis Labs: No results found for this or any previous visit (from the past 240 hour(s)).   Radiological Exams on Admission: MR BRAIN WO CONTRAST  Result Date: 03/22/2021 CLINICAL DATA:  45 year old male with a history distal right vertebral artery occlusion and right PICA infarct in 2018. Code stroke presentation, with acute distal left vertebral artery dissection on CTA. EXAM: MRI HEAD WITHOUT CONTRAST TECHNIQUE: Multiplanar, multiecho pulse sequences of the brain and surrounding structures were obtained without intravenous contrast. COMPARISON:  CTA head and neck today.  Brain MRI 05/29/2017. FINDINGS: Brain: There is patchy mildly to moderately restricted diffusion in the left superior cerebellum SCA territory (series 5, image 74) with additional posterior central cerebellar hemisphere restriction (series 5, image 68). No medulla or brainstem involvement identified. However, there is a subtle area of contralateral right SCA involvement (series 5,  image 72). No thalamic or other posterior circulation restricted diffusion. And only subtle FLAIR hyperintensity in the areas of abnormal cerebellar diffusion. No associated hemorrhage. A small chronic area of cystic encephalomalacia in the right lateral medulla has decreased since 2019 and is subtle now (series 15, image 6). Supratentorial gray and white matter signal remains normal. No midline shift, mass effect, evidence of mass lesion, ventriculomegaly, extra-axial collection or acute intracranial hemorrhage. Cervicomedullary junction and pituitary are within normal limits. Vascular: Major intracranial vascular flow voids are stable despite the abnormal left vertebral V3 segment which is partially visible on series 15, image 3 of this exam. Skull and upper cervical spine: Negative. Visualized bone marrow signal is within normal limits. Sinuses/Orbits: Negative orbits. Mild to  moderate paranasal sinus mucosal thickening as on the earlier CT. Other: Mastoids are clear. Visible internal auditory structures appear normal. IMPRESSION: 1. Patchy acute infarcts in the Cerebellum. Left SCA territory primarily affected, but lesser involvement in the left posterior cerebellum and right SCA territory. No associated hemorrhage or mass effect. 2. No other acute intracranial abnormality. Subtle chronic encephalomalacia in the right lateral medulla. Electronically Signed   By: Genevie Ann M.D.   On: 03/22/2021 06:24   CT HEAD CODE STROKE WO CONTRAST  Result Date: 03/22/2021 CLINICAL DATA:  Code stroke. 45 year old male with blurred vision and right side deficits. EXAM: CT HEAD WITHOUT CONTRAST TECHNIQUE: Contiguous axial images were obtained from the base of the skull through the vertex without intravenous contrast. COMPARISON:  Brain MRI 05/29/2017.  Head CT 10/29/2019. FINDINGS: Brain: Stable cerebral volume since 2021, stable and normal cerebral volume. No midline shift, ventriculomegaly, mass effect, evidence of mass  lesion, intracranial hemorrhage or evidence of cortically based acute infarction. Gray-white matter differentiation is within normal limits throughout the brain, with subtle encephalomalacia of the dorsal right medulla better demonstrated by MRI. Vascular: No suspicious intracranial vascular hyperdensity. Skull: Congenital incomplete ossification of the posterior C1 ring. No acute osseous abnormality identified. Sinuses/Orbits: Mild to moderate scattered new posterior ethmoid and sphenoid sinus mucosal thickening. Tympanic cavities and mastoids remain clear. Other: Visualized orbits and scalp soft tissues are within normal limits. ASPECTS [facility] Stroke Program Early CT Score) Total score (0-10 with 10 being normal): 10 IMPRESSION: 1. Stable since 2021 and virtually normal noncontrast CT appearance of the brain. ASPECTS 10. 2. Mild to moderate new ethmoid and sphenoid paranasal sinus disease. 3. These results were communicated to Dr. Rory Percy at 5:39 am on 03/22/2021 by text page via the [facility] messaging system. Electronically Signed   By: Genevie Ann M.D.   On: 03/22/2021 05:39   CT ANGIO HEAD NECK W WO CM (CODE STROKE)  Result Date: 03/22/2021 CLINICAL DATA:  45 year old male with a history of abnormal distal right vertebral artery. Right PICA infarct in 2018. Code stroke presentation. EXAM: CT ANGIOGRAPHY HEAD AND NECK TECHNIQUE: Multidetector CT imaging of the head and neck was performed using the standard protocol during bolus administration of intravenous contrast. Multiplanar CT image reconstructions and MIPs were obtained to evaluate the vascular anatomy. Carotid stenosis measurements (when applicable) are obtained utilizing NASCET criteria, using the distal internal carotid diameter as the denominator. CONTRAST:  100 mL Omnipaque 350 COMPARISON:  CTA head and neck 10/29/2019.  Plain head CT today. FINDINGS: CTA NECK Skeleton: No acute osseous abnormality identified. Congenital incomplete ossification of the  posterior C1 ring. New right maxillary, posterior ethmoid and sphenoid sinus mucosal thickening. Upper chest: Negative. Other neck: Within normal limits. Aortic arch: 3 vessel arch configuration.  No arch atherosclerosis. Right carotid system: Negative. Left carotid system: Negative. Vertebral arteries: Normal proximal right subclavian artery and right vertebral artery origin. Non dominant appearance of the right vertebral artery not significantly changed since 2021. The vessel remains patent to the skull base with no stenosis. Normal proximal left subclavian artery and left vertebral artery origin. Stable dominant appearance of the left vertebral artery in the V1 and V2 segments. However, new distal left vertebral artery dissection and high-grade stenosis beginning at the left V3 segment with subtotal thrombosed lumen at the C1 level. See series 7, images 170-161. Improved enhancement of the vessel at the skull base. Preliminary results of the above were communicated to Dr. Rory Percy at 5:47 am on 03/22/2021 by  text page via the [facility] messaging system. CTA HEAD Posterior circulation: Despite the left V3 appearance the left vertebral V4 segment remains patent and appears fairly stable compared to 2021. Left PICA and left vertebrobasilar junction remain patent. Chronic diminutive distal right vertebral which terminates in PICA, chronic right V4 segment occlusion distal to the right PICA. Stable diminutive and mildly irregular basilar artery enhancement to the basilar tip. Bilateral SCA and PCA origins are stable and patent. Both posterior communicating arteries present as before. Bilateral PCA branches appear stable since 2021, with mild irregularity bilaterally. Anterior circulation: Both ICA siphons are patent and within normal limits. Normal posterior communicating and left ophthalmic artery origins. Patent carotid termini. MCA and ACA origins remain normal. Normal anterior communicating artery. ACA branches are  stable, the right A2 is dominant. Bilateral MCA M1 segments and bifurcations are patent. Bilateral MCA branches are stable since 2021. Venous sinuses: Early contrast timing. The superior sagittal sinus is patent. Anatomic variants: None. Review of the MIP images confirms the above findings IMPRESSION: 1. Acute Dissection Distal Left Vertebral Artery, in the V3 segment with associated high-grade stenosis. This left vertebral is the sole supply to the Basilar Artery, but the left V4 segment and remaining posterior circulation appear stable since a 2021 CTA. 2. Chronically occluded right vertebral V4 segment. Stable diminutive right vertebral artery terminating in PICA. 3. Bilateral carotid arteries appear stable and negative. 4. But mild generalized irregular appearance of the anterior and posterior circulation vessels is noted and stable since 2021. Study discussed by telephone with Dr. Amie Portland on 03/22/2021 at 05:52 . Electronically Signed   By: Genevie Ann M.D.   On: 03/22/2021 05:55    EKG: Independently reviewed.  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  Patient presents with numbness of the right side and dizziness.  Imaging significant for acute dissection of the left vertebral artery with acute patchy infarcts of the cerebellum.  Neurology have been formally consulted and recommended starting patient on heparin drip.  Question possible need of neuro interventional radiology due to both -Admit to progressive bed -Stroke order set initiated -Neuro checks -Continue heparin drip per pharmacy -Check Hemoglobin A1c and lipid panel in a.m. -Check echocardiogram -PT/OT/Speech to evaluate and treat -Appreciate neurology consultative services, will follow-up for any additional recommendations  COVID-19 infection: Acute.  Patient reports that he was  positive for COVID-19 at PCP office on 11/22.  COVID-19 screening still positive. -Focus COVID-19 order set utilized -Albuterol inhaler as needed for shortness of breath/wheezing -Mucinex as needed for cough -Vitamin C and zinc  Hypokalemia: Acute.  Initial potassium 3.4. -Give potassium chloride 20 mEq p.o. x1 -Continue to monitor and replace as needed  History of CVA: Patient with prior history of right vertebral artery dissection and stroke back in 03/2019.  Imaging studies noted chronically occluded right vertebral artery. 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  Home medication regimen includes Repatha 140 mg q. 14 days, Crestor 20 mg daily, and currently receiving experimental drug for elevated lipoprotein a with Dr. Einar Gip.  The study is double blinded and unable to know whether patient is receiving the actual drug or placebo. -Follow-up lipid panel -Continue Crestor  Alcohol use/abuse: On admission alcohol level was 20.  Patient reports drinking 3-4 glasses of wine daily and intermittently will drink  bourbon.  No prior history of withdrawals. -CIWA protocols initiate with prn Ativan   Transaminitis/elevated AST: Chronic.  AST 49 and ALT 30.  AST to ALT ratio is 1.6 which would suspect secondary to patient's alcohol use. -Continue to monitor  Generalized anxiety: Patient reports that he had just recently started Prozac this week. -Continue Prozac  DVT prophylaxis: Heparin Code Status: Full Family Communication: Wife updated at bedside Disposition Plan: Hopefully discharge home once medically stable Consults called: Neurology Admission status: Inpatient, require more than 2 midnight stay  Norval Morton MD [facility]   If 7PM-7AM, please contact night-coverage   03/22/2021, 7:23 AM

## 2021-03-22 NOTE — ED Notes (Signed)
Hospital bed ordered for patient.

## 2021-03-22 NOTE — Progress Notes (Signed)
Responded page to support pt. And staff. Provided emotional support. Pt. Experiencing heart issues. Will follow as needed.  Venida Jarvis, [place], [place], Pager [phone]

## 2021-03-22 NOTE — Progress Notes (Signed)
PT Cancellation Note  Patient Details Name: Jake Moore MRN: [id number] DOB: [birth year]   Cancelled Treatment:    Reason Eval/Treat Not Completed: Medical issues which prohibited therapy. Pt arrived to ED with acute L vertebral artery dissection and MRI showing patchy left superior cerebellar artery territory and right cerebellar infarcts with no mass-effect. Heparin was initiated at 7:59 this AM. Coordinated with MD who requested PT hold off until tomorrow. Will plan to follow-up tomorrow.   Raymond Gurney, PT, DPT Acute Rehabilitation Services  Pager: [phone] Office: [phone]    Jewel Baize 03/22/2021, 11:00 AM

## 2021-03-22 NOTE — Progress Notes (Signed)
BLE venous duplex has been completed.   Results can be found under chart review under CV PROC. 03/22/2021 4:30 PM Britiney Blahnik RVT, RDMS

## 2021-03-22 NOTE — Consult Note (Addendum)
Advanced Heart Failure Team Consult Note   Primary Physician: Cleatis Polka., MD PCP-Cardiologist:  None  Reason for Consultation: Abnormal ECG  HPI:    Jake Moore is seen today for evaluation of abnormal ECG at the request of Dr. Katrinka Blazing.   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  There was a question at that time whether it was thrombotic occlusion versus vertebral dissection.  Patient had left paresthesias and dizziness/imbalance that gradually recovered.  TEE showed a small PFO, so there was some concern about paradoxical embolism. He also has markedly elevated Lp(a) and has been on Repatha + Crestor.  He has been enrolled in study of pelcarseran, an Lp(a) lowering agent (placebo versus drug).  He has been on Xarelto recently for CVA prevention with ?paradoxical embolism versus vertebral dissection in 2018.    He was doing well until earlier this morning. Woke up from sleep with dizziness and right-sided paresthesias reminiscent of prior CVA.  He came to the ER, CTA head/neck showed acute dissection distal left vertebral artery with high grade stenosis, chronic occlusion of the right vertebral.  MRI showed bilateral cerebellar infarcts.   Of note, patient had COVID-19 a week ago but had recovered and is feeling back to normal.  He did not do any heavy lifting, no neck manipulation prior to this.    He has a sister with MI and elevated Lp(a), not sure if SCAD or thrombotic.   ECG this morning read as inferior MI.  I reviewed, he has Q in lead IIII.  No significant change from prior ECG several years ago.  No chest pain or dyspnea. He has never had a coronary event with elevated Lp(a).    Currently nauseated with vomiting.  Right paresthesias improving.   Review of Systems: All systems reviewed and negative except as per HPI  Home Medications Prior to Admission medications   Medication Sig Start Date End Date  Taking? Authorizing Provider  FLUoxetine (PROZAC) 10 MG capsule Take 10 mg by mouth every evening.   Yes [provider]  NON FORMULARY Inject 1 Dose as directed every 30 (thirty) days. Study drug   Yes [provider]  REPATHA SURECLICK 140 MG/ML SOAJ INJECT 1 PEN INTO SKIN EVERY 14 DAYS Patient taking differently: Inject 140 mg into the skin every 14 (fourteen) days. 08/31/20  Yes Laurey Morale, MD  rosuvastatin (CRESTOR) 20 MG tablet TAKE ONE TABLET DAILY Patient taking differently: Take 20 mg by mouth daily. 12/04/20  Yes Hilty, Lisette Abu, MD  XARELTO 20 MG TABS tablet TAKE ONE TABLET EACH DAY WITH SUPPER Patient taking differently: Take 20 mg by mouth daily with supper. 02/16/21  Yes Hilty, Lisette Abu, MD    Past Medical History: Past Medical History:  Diagnosis Date   Anxiety    CVA (cerebral vascular accident) (HCC)    HLD (hyperlipidemia)     Past Surgical History: Past Surgical History:  Procedure Laterality Date   TEE WITHOUT CARDIOVERSION N/A 04/14/2017   Procedure: TRANSESOPHAGEAL ECHOCARDIOGRAM (TEE);  Surgeon: Dolores Patty, MD;  Location: [facility] ENDOSCOPY;  Service: Cardiovascular;  Laterality: N/A;    Family History: Family History  Problem Relation Age of Onset   Other Sister        hypercoagulable state with SCAD   CAD Maternal Grandfather    Sleep apnea Father     Social History: Social History   Socioeconomic History  Marital status: Single    Spouse name: Not on file   Number of children: Not on file   Years of education: Not on file   Highest education level: Not on file  Occupational History   Not on file  Tobacco Use   Smoking status: Former    Years: 20.00    Types: Cigarettes    Quit date: 01/15/2016    Years since quitting: 5.1   Smokeless tobacco: Former    Types: Associate Professor Use: Never used  Substance and Sexual Activity   Alcohol use: Yes    Alcohol/week: 10.0 standard drinks    Types: 10 Cans  of beer per week    Comment: social 10  beers per week   Drug use: No   Sexual activity: Yes  Other Topics Concern   Not on file  Social History Narrative   Not on file   Social Determinants of Health   Financial Resource Strain: Not on file  Food Insecurity: Not on file  Transportation Needs: Not on file  Physical Activity: Not on file  Stress: Not on file  Social Connections: Not on file    Allergies:  Allergies  Allergen Reactions   Penicillins Hives    Has patient had a PCN reaction causing immediate rash, facial/tongue/throat swelling, SOB or lightheadedness with hypotension: YES Has patient had a PCN reaction causing severe rash involving mucus membranes or skin necrosis: NO Has patient had a PCN reaction that required hospitalizationNO Has patient had a PCN reaction occurring within the last 10 years: NO If all of the above answers are "NO", then may proceed with Cephalosporin use. Has patient had a PCN reaction causing immediate rash, facial/tongue/throat swelling, SOB or lightheadedness with hypotension: YES Has patient had a PCN reaction causing severe rash involving mucus membranes or skin necrosis: NO Has patient had a PCN reaction that required hospitalizationNO Has patient had a PCN reaction occurring within the last 10 years: NO If all of the above answers are "NO", then may proceed with Cephalosporin use.   Propofol     Increased blood pressure and heart rate    Objective:    Vital Signs:   Temp:  [98 F (36.7 C)-98.5 F (36.9 C)] 98.5 F (36.9 C) (12/01 0820) Pulse Rate:  [77-109] 109 (12/01 1100) Resp:  [12-22] 22 (12/01 1100) BP: (119-144)/(87-102) 144/102 (12/01 1100) SpO2:  [93 %-98 %] 95 % (12/01 1100)    Weight change: There were no vitals filed for this visit.  Intake/Output:   Intake/Output Summary (Last 24 hours) at 03/22/2021 1121 Last data filed at 03/22/2021 0939 Gross per 24 hour  Intake 50 ml  Output --  Net 50 ml       Physical Exam    General:  Well appearing. No resp difficulty HEENT: normal Neck: supple. JVP not elevated. Carotids 2+ bilat; no bruits. No lymphadenopathy or thyromegaly appreciated. Cor: PMI nondisplaced. Regular rate & rhythm. No rubs, gallops or murmurs. Lungs: clear Abdomen: soft, nontender, nondistended. No hepatosplenomegaly. No bruits or masses. Good bowel sounds. Extremities: no cyanosis, clubbing, rash, edema Neuro: alert & orientedx3, cranial nerves grossly intact. moves all 4 extremities w/o difficulty. Affect pleasant   Telemetry   NSR 70s (personally reviewed)  EKG    NSR, Q in III.  No change from prior.  Personally reviewed  Labs   Basic Metabolic Panel: Recent Labs  Lab 03/22/21 0524 03/22/21 0533  NA 140 141  K 3.4*  3.4*  CL 108 108  CO2 18*  --   GLUCOSE 113* 110*  BUN 12 12  CREATININE 0.97 0.80  CALCIUM 8.8*  --     Liver Function Tests: Recent Labs  Lab 03/22/21 0524  AST 49*  ALT 30  ALKPHOS 63  BILITOT 0.8  PROT 6.6  ALBUMIN 4.0   No results for input(s): LIPASE, AMYLASE in the last 168 hours. No results for input(s): AMMONIA in the last 168 hours.  CBC: Recent Labs  Lab 03/22/21 0524 03/22/21 0533  WBC 6.2  --   NEUTROABS 2.6  --   HGB 15.3 15.0  HCT 43.9 44.0  MCV 90.5  --   PLT 227  --     Cardiac Enzymes: No results for input(s): CKTOTAL, CKMB, CKMBINDEX, TROPONINI in the last 168 hours.  BNP: BNP (last 3 results) No results for input(s): BNP in the last 8760 hours.  ProBNP (last 3 results) No results for input(s): PROBNP in the last 8760 hours.   CBG: Recent Labs  Lab 03/22/21 0649  GLUCAP 116*    Coagulation Studies: Recent Labs    03/22/21 0524  LABPROT 15.6*  INR 1.2     Imaging   MR BRAIN WO CONTRAST  Result Date: 03/22/2021 CLINICAL DATA:  45 year old male with a history distal right vertebral artery occlusion and right PICA infarct in 2018. Code stroke presentation, with acute  distal left vertebral artery dissection on CTA. EXAM: MRI HEAD WITHOUT CONTRAST TECHNIQUE: Multiplanar, multiecho pulse sequences of the brain and surrounding structures were obtained without intravenous contrast. COMPARISON:  CTA head and neck today.  Brain MRI 05/29/2017. FINDINGS: Brain: There is patchy mildly to moderately restricted diffusion in the left superior cerebellum SCA territory (series 5, image 74) with additional posterior central cerebellar hemisphere restriction (series 5, image 68). No medulla or brainstem involvement identified. However, there is a subtle area of contralateral right SCA involvement (series 5, image 72). No thalamic or other posterior circulation restricted diffusion. And only subtle FLAIR hyperintensity in the areas of abnormal cerebellar diffusion. No associated hemorrhage. A small chronic area of cystic encephalomalacia in the right lateral medulla has decreased since 2019 and is subtle now (series 15, image 6). Supratentorial gray and white matter signal remains normal. No midline shift, mass effect, evidence of mass lesion, ventriculomegaly, extra-axial collection or acute intracranial hemorrhage. Cervicomedullary junction and pituitary are within normal limits. Vascular: Major intracranial vascular flow voids are stable despite the abnormal left vertebral V3 segment which is partially visible on series 15, image 3 of this exam. Skull and upper cervical spine: Negative. Visualized bone marrow signal is within normal limits. Sinuses/Orbits: Negative orbits. Mild to moderate paranasal sinus mucosal thickening as on the earlier CT. Other: Mastoids are clear. Visible internal auditory structures appear normal. IMPRESSION: 1. Patchy acute infarcts in the Cerebellum. Left SCA territory primarily affected, but lesser involvement in the left posterior cerebellum and right SCA territory. No associated hemorrhage or mass effect. 2. No other acute intracranial abnormality. Subtle  chronic encephalomalacia in the right lateral medulla. Electronically Signed   By: Odessa Fleming M.D.   On: 03/22/2021 06:24   DG CHEST PORT 1 VIEW  Result Date: 03/22/2021 CLINICAL DATA:  COVID-19 EXAM: PORTABLE CHEST 1 VIEW COMPARISON:  Chest x-ray dated July 14, 2016 FINDINGS: The heart size and mediastinal contours are within normal limits. Both lungs are clear. The visualized skeletal structures are unremarkable. IMPRESSION: No active disease. Electronically Signed   By: Tacey Ruiz  Strickland M.D.   On: 03/22/2021 08:05   CT HEAD CODE STROKE WO CONTRAST  Result Date: 03/22/2021 CLINICAL DATA:  Code stroke. 45 year old male with blurred vision and right side deficits. EXAM: CT HEAD WITHOUT CONTRAST TECHNIQUE: Contiguous axial images were obtained from the base of the skull through the vertex without intravenous contrast. COMPARISON:  Brain MRI 05/29/2017.  Head CT 10/29/2019. FINDINGS: Brain: Stable cerebral volume since 2021, stable and normal cerebral volume. No midline shift, ventriculomegaly, mass effect, evidence of mass lesion, intracranial hemorrhage or evidence of cortically based acute infarction. Gray-white matter differentiation is within normal limits throughout the brain, with subtle encephalomalacia of the dorsal right medulla better demonstrated by MRI. Vascular: No suspicious intracranial vascular hyperdensity. Skull: Congenital incomplete ossification of the posterior C1 ring. No acute osseous abnormality identified. Sinuses/Orbits: Mild to moderate scattered new posterior ethmoid and sphenoid sinus mucosal thickening. Tympanic cavities and mastoids remain clear. Other: Visualized orbits and scalp soft tissues are within normal limits. ASPECTS [facility] Stroke Program Early CT Score) Total score (0-10 with 10 being normal): 10 IMPRESSION: 1. Stable since 2021 and virtually normal noncontrast CT appearance of the brain. ASPECTS 10. 2. Mild to moderate new ethmoid and sphenoid paranasal sinus  disease. 3. These results were communicated to Dr. Wilford Corner at 5:39 am on 03/22/2021 by text page via the [facility] messaging system. Electronically Signed   By: Odessa Fleming M.D.   On: 03/22/2021 05:39   CT ANGIO HEAD NECK W WO CM (CODE STROKE)  Result Date: 03/22/2021 CLINICAL DATA:  45 year old male with a history of abnormal distal right vertebral artery. Right PICA infarct in 2018. Code stroke presentation. EXAM: CT ANGIOGRAPHY HEAD AND NECK TECHNIQUE: Multidetector CT imaging of the head and neck was performed using the standard protocol during bolus administration of intravenous contrast. Multiplanar CT image reconstructions and MIPs were obtained to evaluate the vascular anatomy. Carotid stenosis measurements (when applicable) are obtained utilizing NASCET criteria, using the distal internal carotid diameter as the denominator. CONTRAST:  100 mL Omnipaque 350 COMPARISON:  CTA head and neck 10/29/2019.  Plain head CT today. FINDINGS: CTA NECK Skeleton: No acute osseous abnormality identified. Congenital incomplete ossification of the posterior C1 ring. New right maxillary, posterior ethmoid and sphenoid sinus mucosal thickening. Upper chest: Negative. Other neck: Within normal limits. Aortic arch: 3 vessel arch configuration.  No arch atherosclerosis. Right carotid system: Negative. Left carotid system: Negative. Vertebral arteries: Normal proximal right subclavian artery and right vertebral artery origin. Non dominant appearance of the right vertebral artery not significantly changed since 2021. The vessel remains patent to the skull base with no stenosis. Normal proximal left subclavian artery and left vertebral artery origin. Stable dominant appearance of the left vertebral artery in the V1 and V2 segments. However, new distal left vertebral artery dissection and high-grade stenosis beginning at the left V3 segment with subtotal thrombosed lumen at the C1 level. See series 7, images 170-161. Improved enhancement of  the vessel at the skull base. Preliminary results of the above were communicated to Dr. Wilford Corner at 5:47 am on 03/22/2021 by text page via the [facility] messaging system. CTA HEAD Posterior circulation: Despite the left V3 appearance the left vertebral V4 segment remains patent and appears fairly stable compared to 2021. Left PICA and left vertebrobasilar junction remain patent. Chronic diminutive distal right vertebral which terminates in PICA, chronic right V4 segment occlusion distal to the right PICA. Stable diminutive and mildly irregular basilar artery enhancement to the basilar tip. Bilateral SCA and  PCA origins are stable and patent. Both posterior communicating arteries present as before. Bilateral PCA branches appear stable since 2021, with mild irregularity bilaterally. Anterior circulation: Both ICA siphons are patent and within normal limits. Normal posterior communicating and left ophthalmic artery origins. Patent carotid termini. MCA and ACA origins remain normal. Normal anterior communicating artery. ACA branches are stable, the right A2 is dominant. Bilateral MCA M1 segments and bifurcations are patent. Bilateral MCA branches are stable since 2021. Venous sinuses: Early contrast timing. The superior sagittal sinus is patent. Anatomic variants: None. Review of the MIP images confirms the above findings IMPRESSION: 1. Acute Dissection Distal Left Vertebral Artery, in the V3 segment with associated high-grade stenosis. This left vertebral is the sole supply to the Basilar Artery, but the left V4 segment and remaining posterior circulation appear stable since a 2021 CTA. 2. Chronically occluded right vertebral V4 segment. Stable diminutive right vertebral artery terminating in PICA. 3. Bilateral carotid arteries appear stable and negative. 4. But mild generalized irregular appearance of the anterior and posterior circulation vessels is noted and stable since 2021. Study discussed by telephone with Dr. Milon Dikes on 03/22/2021 at 05:52 . Electronically Signed   By: Odessa Fleming M.D.   On: 03/22/2021 05:55     Medications:     Current Medications:   stroke: mapping our early stages of recovery book   Does not apply Once   vitamin C  500 mg Oral Daily   FLUoxetine  10 mg Oral QPM   folic acid  1 mg Oral Daily   multivitamin with minerals  1 tablet Oral Daily   potassium chloride  20 mEq Oral Once   rosuvastatin  20 mg Oral Daily   thiamine  100 mg Oral Daily   Or   thiamine  100 mg Intravenous Daily   zinc sulfate  220 mg Oral Daily    Infusions:  heparin 1,000 Units/hr (03/22/21 0702)     Assessment/Plan   1. 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  He did not receive thrombolytics.  He does not report heavy lifting/straining or neck manipulation.  ?Related to inflammation with recent COVID-19. Sister had a coronary event that may have been SCAD but not totally clear.  Currently nauseated and dizzy, right paresthesias still present but improved.   - Neurology to see this morning.  Difficult situation, given prior right vertebral occlusion, will need to consider neurointerventional evaluation for the left vertebral.  - he remains on heparin gtt, anticoagulant regimen per neurology going forwards.  He has been on Xarelto in the past post-CVA.  - I think that the small PFO noted in 2018 is likely unrelated given appearance of vertebral dissection.  However, Lp(a) elevation makes him relatively hypercoagulable so will order lower extremity venous dopplers (unlikely to be positive with Xarelto use).  - Will obtain echo. I do not think that TEE will be helpful unless neuro thinks this could be embolic (seems unlikely).  2. Abnormal ECG: He has Q in lead III that was seen on prior ECGs.  Doubt  cardiac ischemia.  3. Elevated Lp(a): He is on Crestor and Repatha, is in a clinical trial of Lp(a) lowering drug.  LDL is 59 (well-controlled).  4. Elevated AST: Suspect ETOH-related.  Cut back.   Length of Stay: 0  Marca Ancona,  MD  03/22/2021, 11:21 AM  Advanced Heart Failure Team Pager [phone] (M-F; 7a - 5p)  Please contact [facility] for night-coverage after hours (4p -7a ) and weekends on [email or web address]

## 2021-03-22 NOTE — ED Provider Notes (Signed)
Emergency Medicine Provider Triage Evaluation Note  Jake Moore , a 45 y.o. male  was evaluated in triage.  Pt complains of dizziness, double vision, R side tingling and numbness, speech difficulties, n/v.  Patient states he was feeling well when he woke up at 430 this morning.  Soon after waking up, he heard something pop in the back of his head, and he had acute onset of his symptoms.  He reports his symptoms are the same as when he had a stroke 4 years ago.  He has minimal to no deficits left over from the stroke, and states his symptoms today are different.  He is on Xarelto, which he has been taking as prescribed.  Review of Systems  Positive: Dizziness, diplopia, R side tingling and numbness, speech difficulty Negative: cp  Physical Exam  There were no vitals taken for this visit. Gen:   Awake, no distress   Resp:  Normal effort  MSK:   Moves extremities without difficulty  Other:  Nystagmus noted bilaterally.  Strength intact.  Negative pronator drift.  Extremely unsteady on his feet when transferring due to dizziness.  Medical Decision Making  Medically screening exam initiated at 5:19 AM.  Appropriate orders placed.  BAYAN KUSHNIR was informed that the remainder of the evaluation will be completed by another provider, this initial triage assessment does not replace that evaluation, and the importance of remaining in the ED until their evaluation is complete.  Code stroke called due to pts history, sxs, and recent onset with worsening features.    Alveria Apley, PA-C 03/22/21 [id number]    Tilden Fossa, MD 03/22/21 [phone]

## 2021-03-22 NOTE — Progress Notes (Signed)
ANTICOAGULATION CONSULT NOTE - Initial Consult  Pharmacy Consult for Heparin  Indication: Acute left vertebral artery dissection  Allergies  Allergen Reactions   Penicillins Hives    Has patient had a PCN reaction causing immediate rash, facial/tongue/throat swelling, SOB or lightheadedness with hypotension: YES Has patient had a PCN reaction causing severe rash involving mucus membranes or skin necrosis: NO Has patient had a PCN reaction that required hospitalizationNO Has patient had a PCN reaction occurring within the last 10 years: NO If all of the above answers are "NO", then may proceed with Cephalosporin use. Has patient had a PCN reaction causing immediate rash, facial/tongue/throat swelling, SOB or lightheadedness with hypotension: YES Has patient had a PCN reaction causing severe rash involving mucus membranes or skin necrosis: NO Has patient had a PCN reaction that required hospitalizationNO Has patient had a PCN reaction occurring within the last 10 years: NO If all of the above answers are "NO", then may proceed with Cephalosporin use.      Vital Signs: Temp: 98 F (36.7 C) (12/01 0616) Temp Source: Temporal (12/01 0616) BP: 139/93 (12/01 0616) Pulse Rate: 90 (12/01 0616)  Labs: Recent Labs    03/22/21 0524 03/22/21 0533  HGB 15.3 15.0  HCT 43.9 44.0  PLT 227  --   APTT 26  --   LABPROT 15.6*  --   INR 1.2  --   CREATININE 0.97 0.80    CrCl cannot be calculated (Unknown ideal weight.).   Medical History: Past Medical History:  Diagnosis Date   Anxiety    CVA (cerebral vascular accident) [facility])      Assessment: 45 y/o M with previous stroke on Xarelto PTA for secondary prophylaxis. He presents to the ED this AM with dizziness/numbness of the right side. CT angio with acute dissection of the distal left vertebral artery. MRI with patchy acute infarcts in the cerebellum.   Last dose of Xarelto was 11/30 at ~1730. Discussed with Neurology, will start  heparin now. Anticipate using aPTT to dose.  Goal of Therapy:  Heparin level 0.3-0.5 units/ml Monitor platelets by anticoagulation protocol: Yes   Plan:  No boluses Start heparin drip at 1000 units/hr 1500 heparin level and aPTT Daily CBC, heparin level, and aPTT Monitor closely for bleeding/neuro changes  Abran Duke, PharmD, BCPS Clinical Pharmacist Phone: [phone]

## 2021-03-22 NOTE — ED Notes (Signed)
Pt arrives to MRI with this RN and Rapid RN.

## 2021-03-22 NOTE — Consult Note (Addendum)
Neurology Consultation  Reason for Consult: Right-sided numbness, disequilibrium Referring Physician: Dr. Quintella Reichert  CC: Right-sided numbness, disequilibrium  History is obtained from: Chart review, patient  HPI: Jake Moore is a 45 y.o. 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 [name] up this morning usual state of health at 4:30 AM to let the dogs out and soon after as he went to lay back down again started feeling extremely dizzy and started having numbness in the whole right side of his body. Prior stroke with left him with left body paresthesias so he is aware of the symptoms but the symptoms today are on the other side and completely new-of sudden onset. He waited a few minutes before calling EMS but when the symptoms did not resolve, they called EMS. The EMS did not activate the code stroke in the field due to sensory symptoms only but when he was assessed by the ED providers, code stroke was promptly initiated. He is on Xarelto per cardiology and took his last dose at around 5 or 5:30 PM yesterday. Denies any current headaches.  Reports blurred vision.  Reported blurred vision with a prior stroke that had completely resolved and today his blurred vision is completely new which started around the same time as the numbness.  Has failed antiplatelets due to side effects Has also failed Eliquis due to compliance issues. Recommended to be on Xarelto from cardiology due to possible prothrombotic effects of the investigative medication that he is on..  Reports anxiety, recent COVID infection a week ago.  Rest of the review of system negative.   LKW: 4:30 AM tpa given?: no, on Xarelto, last dose within 12 hours. Premorbid modified  Rankin scale (mRS): 1 ROS: Full ROS was performed and is negative except as noted in the HPI.   Past Medical History:  Diagnosis Date   Anxiety    CVA (cerebral vascular accident) ([place])    Family History  Problem Relation Age of Onset   Other Sister        hypercoagulable state with SCAD   CAD Maternal Grandfather    Sleep apnea Father     Social History:   reports that he quit smoking about 5 years ago. His smoking use included cigarettes. He has quit using smokeless tobacco.  His smokeless tobacco use included chew. He reports current alcohol use of about 10.0 standard drinks per week. He reports that he does not use drugs.  Medications No current facility-administered medications for this encounter.  Current Outpatient Medications:    REPATHA SURECLICK XX123456 MG/ML SOAJ, INJECT 1 PEN INTO SKIN EVERY 14 DAYS (Patient taking differently: Inject 140 mg into the skin every 14 (fourteen) days.), Disp: 2 mL, Rfl: 11   rosuvastatin (CRESTOR) 20 MG tablet, TAKE ONE TABLET DAILY (Patient taking differently: Take 20 mg by mouth daily.), Disp: 90 tablet, Rfl: 3   XARELTO 20 MG TABS tablet, TAKE ONE TABLET EACH DAY WITH SUPPER (Patient taking differently: Take 20 mg by mouth daily with supper.), Disp: 90 tablet, Rfl: 1 [employer] experimental medication HORIZON: A Phase II Study to Evaluate the Safety and Efficacy of Two Doses of GT005  Exam: Current vital signs: BP (!) 139/93   Pulse 90   Temp 98 F (36.7 C) (Temporal)   Resp 15  SpO2 93%  Vital signs in last 24 hours: Temp:  [98 F (36.7 C)] 98 F (36.7 C) (12/01 0616) Pulse Rate:  [90] 90 (12/01 0616) Resp:  [15] 15 (12/01 0616) BP: (139)/(93) 139/93 (12/01 0616) SpO2:  [93 %] 93 % (12/01 0616)  General: Awake alert anxious HNT: Normocephalic atraumatic Lungs: Clear to auscultation exam cardiovascular: Regular rhythm next abdomen nondistended nontender Extremities warm well perfused Neurological exam Awake alert oriented  x3 No aphasia or dysarthria Cranials: Pupils equal round react light, extraocular movement examination shows unrestricted eye movements but he has a nystagmus which does not fatigue on leftward gaze and also is present on upward gaze.  The nystagmus is less prominent on rightward gaze.  Facial sensation intact, face symmetric, tongue and palate midline Motor exam with no drift Sensory: Diminished sensation on the right in comparison to the left although left at baseline has some paresthesias Coordination with no obvious dysmetria NIH stroke scale-1 for sensory  Labs I have reviewed labs in epic and the results pertinent to this consultation are:  CBC    Component Value Date/Time   WBC 6.2 03/22/2021 0524   RBC 4.85 03/22/2021 0524   HGB 15.0 03/22/2021 0533   HCT 44.0 03/22/2021 0533   PLT 227 03/22/2021 0524   MCV 90.5 03/22/2021 0524   MCH 31.5 03/22/2021 0524   MCHC 34.9 03/22/2021 0524   RDW 11.4 (L) 03/22/2021 0524   LYMPHSABS 2.8 03/22/2021 0524   MONOABS 0.7 03/22/2021 0524   EOSABS 0.2 03/22/2021 0524   BASOSABS 0.0 03/22/2021 0524    CMP     Component Value Date/Time   NA 141 03/22/2021 0533   K 3.4 (L) 03/22/2021 0533   CL 108 03/22/2021 0533   CO2 18 (L) 03/22/2021 0524   GLUCOSE 110 (H) 03/22/2021 0533   BUN 12 03/22/2021 0533   CREATININE 0.80 03/22/2021 0533   CALCIUM 8.8 (L) 03/22/2021 0524   PROT 6.6 03/22/2021 0524   PROT 6.9 05/08/2018 0815   ALBUMIN 4.0 03/22/2021 0524   ALBUMIN 4.8 05/08/2018 0815   AST 49 (H) 03/22/2021 0524   ALT 30 03/22/2021 0524   ALKPHOS 63 03/22/2021 0524   BILITOT 0.8 03/22/2021 0524   BILITOT 0.3 05/08/2018 0815   GFRNONAA >60 03/22/2021 0524   GFRAA >60 10/29/2019 1320    Lipid Panel     Component Value Date/Time   CHOL 117 05/08/2018 0815   TRIG 71 05/08/2018 0815   HDL 63 05/08/2018 0815   CHOLHDL 1.9 05/08/2018 0815   CHOLHDL 2.0 04/12/2017 0254   VLDL 13 04/12/2017 0254   LDLCALC 40 05/08/2018 0815      Imaging I have reviewed the images obtained:  CT-head-aspects 10 CTA head and neck-left vertebral artery dissection IMPRESSION: 1. Acute Dissection Distal Left Vertebral Artery, in the V3 segment with associated high-grade stenosis. This left vertebral is the sole supply to the Basilar Artery, but the left V4 segment and remaining posterior circulation appear stable since a 2021 CTA.   2. Chronically occluded right vertebral V4 segment. Stable diminutive right vertebral artery terminating in PICA.   3. Bilateral carotid arteries appear stable and negative.   4. But mild generalized irregular appearance of the anterior and posterior circulation vessels is noted and stable since 2021.  MRI brain-ordered stat-patchy acute infarcts in the left SCA territory but lesser involvement in the left posterior cerebellum and the right MCA territory as well.  No associated hemorrhage or mass-effect.  Assessment: 45 year old  man past history of right lateral medullary and right cerebellar strokes with residual right facial and left hemibody paresthesias, anxiety, hyperlipidemia with elevated lipoprotein a for which he is on Repatha, Crestor and a new [employer] investigative medication-follows with Dr. Gwynneth Macleod woke up at 4:30 AM normal state of health and's had sudden onset of dizziness, blurred vision and numbness of the right hemibody. On examination-NIH stroke scale-1 On Xarelto per cardiology-last dose 5:30 PM yesterday-making him not a candidate for TNKase. CT head unremarkable CTA head and neck with acute left vertebral artery dissection in the V3 segment with associated high-grade stenosis. Chronically occluded right vertebral artery Carotid stable and negative MRI ordered stat-shows patchy left superior cerebellar artery territory and right cerebellar infarcts with no mass-effect. Etiology of infarcts is likely the vertebral artery dissection on the left Unclear if recent COVID infection  has anything to contribute to current strokes.  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 Might have to consider consulting neuro interventional radiology given that his left vertebral arteries is also prior to his basilar as the right vertebral artery has been chronically occluded now for many years. Frequent neurochecks Telemetry 2D echo A1c Lipid panel PT OT Speech therapy Given that he is on heparin, I will allow permissive hypertension in the setting of acute stroke but the goal permissive hypertension will be systolic blood pressure of 180 mmHg or below. Stroke team to follow  Plan discussed with Dr. Pecola Leisure in the emergency room.  -- Milon Dikes, MD Neurologist [facility] Pager: [phone]

## 2021-03-22 NOTE — ED Provider Notes (Signed)
[facility] EMERGENCY DEPARTMENT Provider Note   CSN: [id number] Arrival date & time: 03/22/21  4098  An emergency department physician performed an initial assessment on this suspected stroke patient at 0518.  History Chief Complaint  Patient presents with   Code Stroke    Jake Moore is a 45 y.o. male.  The history is provided by the patient and medical records.  Jake Moore is a 45 y.o. male who presents to the Emergency Department complaining of numbness, dizziness. He presents to the emergency department for evaluation of sudden onset dizziness, double vision and right-sided numbness and tingling that started just prior to ED arrival. He was in his routine state of health when he awoke around 430 this morning. Just after he went to fetus dogs he had a sensation in the back of his head followed by dizziness and numbness to the right side of his body. He had a similar episode previously when he had a stroke secondary to a vertebral dissection. His prior cva resulted in numbness to the left side of his body.  He is on Xarelto, last dose at 5:30 PM.    Past Medical History:  Diagnosis Date   Anxiety    CVA (cerebral vascular accident) [facility])     Patient Active Problem List   Diagnosis Date Noted   Dizziness 05/29/2017   AKI (acute kidney injury) (HCC)    Blood glucose elevated    Generalized anxiety disorder    Transaminitis    CVA (cerebral vascular accident) (HCC) 04/17/2017   Ataxia due to recent stroke    Altered sensation due to recent stroke    Anxiety state    Attention deficit disorder    Hypokalemia    Stroke, Wallenberg's syndrome    Stroke (HCC) 04/11/2017   Hyperlipidemia 04/11/2017   Occlusion and stenosis of vertebral artery 04/11/2017   Anxiety     Past Surgical History:  Procedure Laterality Date   TEE WITHOUT CARDIOVERSION N/A 04/14/2017   Procedure: TRANSESOPHAGEAL ECHOCARDIOGRAM (TEE);  Surgeon: Dolores Patty, MD;   [facility] ENDOSCOPY;  Service: Cardiovascular;  Laterality: N/A;       Family History  Problem Relation Age of Onset   Other Sister        hypercoagulable state with SCAD   CAD Maternal Grandfather    Sleep apnea Father     Social History   Tobacco Use   Smoking status: Former    Years: 20.00    Types: Cigarettes    Quit date: 01/15/2016    Years since quitting: 5.1   Smokeless tobacco: Former    Types: Associate Professor Use: Never used  Substance Use Topics   Alcohol use: Yes    Alcohol/week: 10.0 standard drinks    Types: 10 Cans of beer per week    Comment: social 10  beers per week   Drug use: No    Home Medications Prior to Admission medications   Medication Sig Start Date End Date Taking? Authorizing Provider  FLUoxetine (PROZAC) 10 MG capsule Take 10 mg by mouth every evening.   Yes [provider]  NON FORMULARY Inject 1 Dose as directed every 30 (thirty) days. Study drug   Yes [provider]  REPATHA SURECLICK 140 MG/ML SOAJ INJECT 1 PEN INTO SKIN EVERY 14 DAYS Patient taking differently: Inject 140 mg into the skin every 14 (fourteen) days. 08/31/20  Yes Laurey Morale, MD  rosuvastatin (CRESTOR)  20 MG tablet TAKE ONE TABLET DAILY Patient taking differently: Take 20 mg by mouth daily. 12/04/20  Yes Hilty, Lisette Abu, MD  XARELTO 20 MG TABS tablet TAKE ONE TABLET EACH DAY WITH SUPPER Patient taking differently: Take 20 mg by mouth daily with supper. 02/16/21  Yes Hilty, Lisette Abu, MD    Allergies    Penicillins and Propofol  Review of Systems   Review of Systems  All other systems reviewed and are negative.  Physical Exam Updated Vital Signs BP (!) 129/95   Pulse 95   Temp 98 F (36.7 C) (Temporal)   Resp 17   SpO2 94%   Physical Exam Vitals and nursing note reviewed.  Constitutional:      Appearance: He is well-developed.  HENT:     Head: Normocephalic and atraumatic.  Cardiovascular:     Rate and Rhythm:  Normal rate and regular rhythm.     Heart sounds: No murmur heard. Pulmonary:     Effort: Pulmonary effort is normal. No respiratory distress.     Breath sounds: Normal breath sounds.  Abdominal:     Palpations: Abdomen is soft.     Tenderness: There is no abdominal tenderness. There is no guarding or rebound.  Musculoskeletal:        General: No tenderness.  Skin:    General: Skin is warm and dry.  Neurological:     Mental Status: He is alert and oriented to person, place, and time.     Comments: Nystagmus on horizontal gaze.  Altered sensation to light touch in RUE/RLE.  5/5 strength in all four extremities.    Psychiatric:        Behavior: Behavior normal.    ED Results / Procedures / Treatments   Labs (all labs ordered are listed, but only abnormal results are displayed) Labs Reviewed  PROTIME-INR - Abnormal; Notable for the following components:      Result Value   Prothrombin Time 15.6 (*)    All other components within normal limits  CBC - Abnormal; Notable for the following components:   RDW 11.4 (*)    All other components within normal limits  COMPREHENSIVE METABOLIC PANEL - Abnormal; Notable for the following components:   Potassium 3.4 (*)    CO2 18 (*)    Glucose, Bld 113 (*)    Calcium 8.8 (*)    AST 49 (*)    All other components within normal limits  ETHANOL - Abnormal; Notable for the following components:   Alcohol, Ethyl (B) 20 (*)    All other components within normal limits  I-STAT CHEM 8, ED - Abnormal; Notable for the following components:   Potassium 3.4 (*)    Glucose, Bld 110 (*)    Calcium, Ion 1.11 (*)    TCO2 18 (*)    All other components within normal limits  CBG MONITORING, ED - Abnormal; Notable for the following components:   Glucose-Capillary 116 (*)    All other components within normal limits  RESP PANEL BY RT-PCR (FLU A&B, COVID) ARPGX2  APTT  DIFFERENTIAL  RAPID URINE DRUG SCREEN, HOSP PERFORMED  URINALYSIS, ROUTINE W REFLEX  MICROSCOPIC  HEPARIN LEVEL (UNFRACTIONATED)  APTT    EKG None  Radiology MR BRAIN WO CONTRAST  Result Date: 03/22/2021 CLINICAL DATA:  45 year old male with a history distal right vertebral artery occlusion and right PICA infarct in 2018. Code stroke presentation, with acute distal left vertebral artery dissection on CTA. EXAM: MRI HEAD WITHOUT CONTRAST  TECHNIQUE: Multiplanar, multiecho pulse sequences of the brain and surrounding structures were obtained without intravenous contrast. COMPARISON:  CTA head and neck today.  Brain MRI 05/29/2017. FINDINGS: Brain: There is patchy mildly to moderately restricted diffusion in the left superior cerebellum SCA territory (series 5, image 74) with additional posterior central cerebellar hemisphere restriction (series 5, image 68). No medulla or brainstem involvement identified. However, there is a subtle area of contralateral right SCA involvement (series 5, image 72). No thalamic or other posterior circulation restricted diffusion. And only subtle FLAIR hyperintensity in the areas of abnormal cerebellar diffusion. No associated hemorrhage. A small chronic area of cystic encephalomalacia in the right lateral medulla has decreased since 2019 and is subtle now (series 15, image 6). Supratentorial gray and white matter signal remains normal. No midline shift, mass effect, evidence of mass lesion, ventriculomegaly, extra-axial collection or acute intracranial hemorrhage. Cervicomedullary junction and pituitary are within normal limits. Vascular: Major intracranial vascular flow voids are stable despite the abnormal left vertebral V3 segment which is partially visible on series 15, image 3 of this exam. Skull and upper cervical spine: Negative. Visualized bone marrow signal is within normal limits. Sinuses/Orbits: Negative orbits. Mild to moderate paranasal sinus mucosal thickening as on the earlier CT. Other: Mastoids are clear. Visible internal auditory structures  appear normal. IMPRESSION: 1. Patchy acute infarcts in the Cerebellum. Left SCA territory primarily affected, but lesser involvement in the left posterior cerebellum and right SCA territory. No associated hemorrhage or mass effect. 2. No other acute intracranial abnormality. Subtle chronic encephalomalacia in the right lateral medulla. Electronically Signed   By: Odessa Fleming M.D.   On: 03/22/2021 06:24   CT HEAD CODE STROKE WO CONTRAST  Result Date: 03/22/2021 CLINICAL DATA:  Code stroke. 45 year old male with blurred vision and right side deficits. EXAM: CT HEAD WITHOUT CONTRAST TECHNIQUE: Contiguous axial images were obtained from the base of the skull through the vertex without intravenous contrast. COMPARISON:  Brain MRI 05/29/2017.  Head CT 10/29/2019. FINDINGS: Brain: Stable cerebral volume since 2021, stable and normal cerebral volume. No midline shift, ventriculomegaly, mass effect, evidence of mass lesion, intracranial hemorrhage or evidence of cortically based acute infarction. Gray-white matter differentiation is within normal limits throughout the brain, with subtle encephalomalacia of the dorsal right medulla better demonstrated by MRI. Vascular: No suspicious intracranial vascular hyperdensity. Skull: Congenital incomplete ossification of the posterior C1 ring. No acute osseous abnormality identified. Sinuses/Orbits: Mild to moderate scattered new posterior ethmoid and sphenoid sinus mucosal thickening. Tympanic cavities and mastoids remain clear. Other: Visualized orbits and scalp soft tissues are within normal limits. ASPECTS [facility] Stroke Program Early CT Score) Total score (0-10 with 10 being normal): 10 IMPRESSION: 1. Stable since 2021 and virtually normal noncontrast CT appearance of the brain. ASPECTS 10. 2. Mild to moderate new ethmoid and sphenoid paranasal sinus disease. 3. These results were communicated to Dr. Wilford Corner at 5:39 am on 03/22/2021 by text page via the [facility] messaging system.  Electronically Signed   By: Odessa Fleming M.D.   On: 03/22/2021 05:39   CT ANGIO HEAD NECK W WO CM (CODE STROKE)  Result Date: 03/22/2021 CLINICAL DATA:  45 year old male with a history of abnormal distal right vertebral artery. Right PICA infarct in 2018. Code stroke presentation. EXAM: CT ANGIOGRAPHY HEAD AND NECK TECHNIQUE: Multidetector CT imaging of the head and neck was performed using the standard protocol during bolus administration of intravenous contrast. Multiplanar CT image reconstructions and MIPs were obtained to evaluate the vascular  anatomy. Carotid stenosis measurements (when applicable) are obtained utilizing NASCET criteria, using the distal internal carotid diameter as the denominator. CONTRAST:  100 mL Omnipaque 350 COMPARISON:  CTA head and neck 10/29/2019.  Plain head CT today. FINDINGS: CTA NECK Skeleton: No acute osseous abnormality identified. Congenital incomplete ossification of the posterior C1 ring. New right maxillary, posterior ethmoid and sphenoid sinus mucosal thickening. Upper chest: Negative. Other neck: Within normal limits. Aortic arch: 3 vessel arch configuration.  No arch atherosclerosis. Right carotid system: Negative. Left carotid system: Negative. Vertebral arteries: Normal proximal right subclavian artery and right vertebral artery origin. Non dominant appearance of the right vertebral artery not significantly changed since 2021. The vessel remains patent to the skull base with no stenosis. Normal proximal left subclavian artery and left vertebral artery origin. Stable dominant appearance of the left vertebral artery in the V1 and V2 segments. However, new distal left vertebral artery dissection and high-grade stenosis beginning at the left V3 segment with subtotal thrombosed lumen at the C1 level. See series 7, images 170-161. Improved enhancement of the vessel at the skull base. Preliminary results of the above were communicated to Dr. Wilford Corner at 5:47 am on 03/22/2021 by text  page via the [facility] messaging system. CTA HEAD Posterior circulation: Despite the left V3 appearance the left vertebral V4 segment remains patent and appears fairly stable compared to 2021. Left PICA and left vertebrobasilar junction remain patent. Chronic diminutive distal right vertebral which terminates in PICA, chronic right V4 segment occlusion distal to the right PICA. Stable diminutive and mildly irregular basilar artery enhancement to the basilar tip. Bilateral SCA and PCA origins are stable and patent. Both posterior communicating arteries present as before. Bilateral PCA branches appear stable since 2021, with mild irregularity bilaterally. Anterior circulation: Both ICA siphons are patent and within normal limits. Normal posterior communicating and left ophthalmic artery origins. Patent carotid termini. MCA and ACA origins remain normal. Normal anterior communicating artery. ACA branches are stable, the right A2 is dominant. Bilateral MCA M1 segments and bifurcations are patent. Bilateral MCA branches are stable since 2021. Venous sinuses: Early contrast timing. The superior sagittal sinus is patent. Anatomic variants: None. Review of the MIP images confirms the above findings IMPRESSION: 1. Acute Dissection Distal Left Vertebral Artery, in the V3 segment with associated high-grade stenosis. This left vertebral is the sole supply to the Basilar Artery, but the left V4 segment and remaining posterior circulation appear stable since a 2021 CTA. 2. Chronically occluded right vertebral V4 segment. Stable diminutive right vertebral artery terminating in PICA. 3. Bilateral carotid arteries appear stable and negative. 4. But mild generalized irregular appearance of the anterior and posterior circulation vessels is noted and stable since 2021. Study discussed by telephone with Dr. Milon Dikes on 03/22/2021 at 05:52 . Electronically Signed   By: Odessa Fleming M.D.   On: 03/22/2021 05:55    Procedures Procedures    Medications Ordered in ED Medications  heparin ADULT infusion 100 units/mL (25000 units/260mL) (has no administration in time range)  sodium chloride flush (NS) 0.9 % injection 3 mL (3 mLs Intravenous Given 03/22/21 0556)  ondansetron (ZOFRAN) injection 4 mg (4 mg Intravenous Given 03/22/21 0525)  iohexol (OMNIPAQUE) 350 MG/ML injection 60 mL (60 mLs Intravenous Contrast Given 03/22/21 0539)  LORazepam (ATIVAN) injection 1 mg (1 mg Intravenous Given 03/22/21 0548)    ED Course  I have reviewed the triage vital signs and the nursing notes.  Pertinent labs & imaging results that were available  during my care of the patient were reviewed by me and considered in my medical decision making (see chart for details).    MDM Rules/Calculators/A&P                          patient with history of CVA secondary to vertebral dissection currently on anticoagulation here for evaluation of symptoms concerning for recurrent CVA. He does have paresthesias to the right side, nystagmus. Code stroke was activated on patients ED arrival and he was promptly evaluated by neurology. He is not a TPA candidate due to his anticoagulation. Imaging is significant for acute CVA. Plan to admit to the medicine service for ongoing workup and treatment.  Final Clinical Impression(s) / ED Diagnoses Final diagnoses:  None    Rx / DC Orders ED Discharge Orders     None        Tilden Fossa, MD 03/22/21 [phone]

## 2021-03-22 NOTE — Progress Notes (Signed)
ANTICOAGULATION CONSULT NOTE - Initial Consult  Pharmacy Consult for Heparin Indication:  Acute left vertebral artery dissection  Allergies  Allergen Reactions   Penicillins Hives    Has patient had a PCN reaction causing immediate rash, facial/tongue/throat swelling, SOB or lightheadedness with hypotension: YES Has patient had a PCN reaction causing severe rash involving mucus membranes or skin necrosis: NO Has patient had a PCN reaction that required hospitalizationNO Has patient had a PCN reaction occurring within the last 10 years: NO If all of the above answers are "NO", then may proceed with Cephalosporin use. Has patient had a PCN reaction causing immediate rash, facial/tongue/throat swelling, SOB or lightheadedness with hypotension: YES Has patient had a PCN reaction causing severe rash involving mucus membranes or skin necrosis: NO Has patient had a PCN reaction that required hospitalizationNO Has patient had a PCN reaction occurring within the last 10 years: NO If all of the above answers are "NO", then may proceed with Cephalosporin use.   Propofol     Increased blood pressure and heart rate    Patient Measurements: Vital Signs: Temp: 98.5 F (36.9 C) (12/01 0820) Temp Source: Oral (12/01 0820) BP: 126/77 (12/01 1600) Pulse Rate: 92 (12/01 1600)  Labs: Recent Labs    03/22/21 0524 03/22/21 0533 03/22/21 1511  HGB 15.3 15.0  --   HCT 43.9 44.0  --   PLT 227  --   --   APTT 26  --  36  LABPROT 15.6*  --   --   INR 1.2  --   --   HEPARINUNFRC  --   --  1.05*  CREATININE 0.97 0.80  --     CrCl cannot be calculated (Unknown ideal weight.).   Medical History: Past Medical History:  Diagnosis Date   Anxiety    CVA (cerebral vascular accident) (HCC)    HLD (hyperlipidemia)     Medications:  (Not in a hospital admission)  Scheduled:    stroke: mapping our early stages of recovery book   Does not apply Once   vitamin C  500 mg Oral Daily   FLUoxetine   10 mg Oral QPM   folic acid  1 mg Oral Daily   multivitamin with minerals  1 tablet Oral Daily   rosuvastatin  20 mg Oral Daily   thiamine  100 mg Oral Daily   Or   thiamine  100 mg Intravenous Daily   zinc sulfate  220 mg Oral Daily   Infusions:   heparin 1,000 Units/hr (03/22/21 0702)   PRN: acetaminophen **OR** acetaminophen (TYLENOL) oral liquid 160 mg/5 mL **OR** acetaminophen, albuterol, guaiFENesin, LORazepam **OR** LORazepam, ondansetron (ZOFRAN) IV  Assessment: 44 yom with a history of previous stroke on Xarelto PTA for secondary prophylaxis. Patient is presenting with dizziness/numbness of the right side. Heparin per pharmacy consult placed for  Acute left vertebral artery dissection . Heparin gtt started at 1000 units/hr this am.  CT angio with acute dissection of the distal left vertebral artery. MRI with patchy acute infarcts in the cerebellum  Patient is on xarelto prior to arrival. Last dose 11/30 ~1730. Will require aPTT monitoring due to likely falsely high anti-Xa level secondary to DOAC use.  Baseline Coags: aPTT 26 PT/INR 15.6/1.2  Heparin level / aPTT today at 1511 1.05 / 36. Will still require aPTT monitoring.  Hgb 15.3; plt 227  Goal of Therapy:  Heparin level 0.3-0.5 units/ml aPTT 66-85 seconds Monitor platelets by anticoagulation protocol: Yes   Plan:  No boluses Increase heparin infusion to 1150 units/hr Check aPTT & anti-Xa level in 6 hours and daily while on heparin Continue to monitor via aPTT until levels are correlated Continue to monitor H&H and platelets  Delmar Landau, PharmD, BCPS 03/22/2021 4:36 PM ED Clinical Pharmacist -  [phone]

## 2021-03-22 NOTE — ED Triage Notes (Signed)
Pt states that he got up about 45 minutes ago, LSN 430am hx of a stroke, began to have blurred vision, dizziness and R sided tingling, pt en route began to develop slurred speech and R sided facial droop

## 2021-03-22 NOTE — Progress Notes (Signed)
STROKE TEAM PROGRESS NOTE   SUBJECTIVE (INTERVAL HISTORY) His wife is at the bedside.  Overall his condition is rapidly improving. He had acute onset of vertigo, right hand numbness and MRI showed left PICA and bilateral SCA infarcts.  CTA head and neck showed left VA dissection.  He was on Xarelto at home, currently on heparin IV.  If tolerating well, will transition back to Xarelto tomorrow. Will consider add aspirin 81 until repeat CTA in 2-3 months.   OBJECTIVE Temp:  [98 F (36.7 C)-98.5 F (36.9 C)] 98.5 F (36.9 C) (12/01 0820) Pulse Rate:  [76-109] 92 (12/01 1600) Cardiac Rhythm: Normal sinus rhythm (12/01 0821) Resp:  [11-22] 11 (12/01 1600) BP: (115-144)/(77-102) 126/77 (12/01 1600) SpO2:  [93 %-98 %] 96 % (12/01 1600)  Recent Labs  Lab 03/22/21 0649  GLUCAP 116*   Recent Labs  Lab 03/22/21 0524 03/22/21 0533  NA 140 141  K 3.4* 3.4*  CL 108 108  CO2 18*  --   GLUCOSE 113* 110*  BUN 12 12  CREATININE 0.97 0.80  CALCIUM 8.8*  --    Recent Labs  Lab 03/22/21 0524  AST 49*  ALT 30  ALKPHOS 63  BILITOT 0.8  PROT 6.6  ALBUMIN 4.0   Recent Labs  Lab 03/22/21 0524 03/22/21 0533  WBC 6.2  --   NEUTROABS 2.6  --   HGB 15.3 15.0  HCT 43.9 44.0  MCV 90.5  --   PLT 227  --    No results for input(s): CKTOTAL, CKMB, CKMBINDEX, TROPONINI in the last 168 hours. Recent Labs    03/22/21 0524  LABPROT 15.6*  INR 1.2   Recent Labs    03/22/21 0910  COLORURINE YELLOW  LABSPEC 1.010  PHURINE 6.5  GLUCOSEU NEGATIVE  HGBUR NEGATIVE  BILIRUBINUR NEGATIVE  KETONESUR 15*  PROTEINUR NEGATIVE  NITRITE NEGATIVE  LEUKOCYTESUR NEGATIVE       Component Value Date/Time   CHOL 133 03/22/2021 0833   CHOL 117 05/08/2018 0815   TRIG 43 03/22/2021 0833   HDL 65 03/22/2021 0833   HDL 63 05/08/2018 0815   CHOLHDL 2.0 03/22/2021 0833   VLDL 9 03/22/2021 0833   LDLCALC 59 03/22/2021 0833   LDLCALC 40 05/08/2018 0815   Lab Results  Component Value Date    HGBA1C 5.2 03/22/2021      Component Value Date/Time   LABOPIA NONE DETECTED 03/22/2021 0910   COCAINSCRNUR NONE DETECTED 03/22/2021 0910   LABBENZ NONE DETECTED 03/22/2021 0910   AMPHETMU NONE DETECTED 03/22/2021 0910   THCU NONE DETECTED 03/22/2021 0910   LABBARB NONE DETECTED 03/22/2021 0910    Recent Labs  Lab 03/22/21 0524  ETH 20*    I have personally reviewed the radiological images below and agree with the radiology interpretations.  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 Code stroke presentation, with acute distal left vertebral artery dissection on CTA. EXAM: MRI HEAD WITHOUT CONTRAST TECHNIQUE: Multiplanar, multiecho pulse sequences of the brain and surrounding structures were obtained without intravenous contrast. COMPARISON:  CTA head and neck today.  Brain MRI 05/29/2017. FINDINGS: Brain: There is patchy mildly to moderately restricted diffusion in the left superior cerebellum SCA territory (series 5, image 74) with additional posterior central cerebellar hemisphere restriction (series 5, image 68). No medulla or brainstem involvement identified. However, there is a subtle area of contralateral right SCA  involvement (series 5, image 72). No thalamic or other posterior circulation restricted diffusion. And only subtle FLAIR hyperintensity in the areas of abnormal cerebellar diffusion. No associated hemorrhage. A small chronic area of cystic encephalomalacia in the right lateral medulla has decreased since 2019 and is subtle now (series 15, image 6). Supratentorial gray and white matter signal remains normal. No midline shift, mass effect, evidence of mass lesion, ventriculomegaly, extra-axial collection or acute intracranial hemorrhage. Cervicomedullary junction and pituitary are within normal limits. Vascular: Major intracranial vascular flow voids are  stable despite the abnormal left vertebral V3 segment which is partially visible on series 15, image 3 of this exam. Skull and upper cervical spine: Negative. Visualized bone marrow signal is within normal limits. Sinuses/Orbits: Negative orbits. Mild to moderate paranasal sinus mucosal thickening as on the earlier CT. Other: Mastoids are clear. Visible internal auditory structures appear normal. IMPRESSION: 1. Patchy acute infarcts in the Cerebellum. Left SCA territory primarily affected, but lesser involvement in the left posterior cerebellum and right SCA territory. No associated hemorrhage or mass effect. 2. No other acute intracranial abnormality. Subtle chronic encephalomalacia in the right lateral medulla. Electronically Signed   By: Odessa Fleming M.D.   On: 03/22/2021 06:24   DG CHEST PORT 1 VIEW  Result Date: 03/22/2021 CLINICAL DATA:  COVID-19 EXAM: PORTABLE CHEST 1 VIEW COMPARISON:  Chest x-ray dated July 14, 2016 FINDINGS: The heart size and mediastinal contours are within normal limits. Both lungs are clear. The visualized skeletal structures are unremarkable. IMPRESSION: No active disease. Electronically Signed   By: Allegra Lai M.D.   On: 03/22/2021 08:05   ECHOCARDIOGRAM COMPLETE  Result Date: 03/22/2021    ECHOCARDIOGRAM REPORT   Patient Name:   Jake Moore [facility] Date of Exam: 03/22/2021 Medical Rec #:  [id number]         Height:       70.0 in Accession #:    [id number]        Weight:       196.2 lb Date of Birth:  Mar 06, 1976         BSA:          2.070 m Patient Age:    45 years          BP:           125/90 mmHg Patient Gender: M                 HR:           71 bpm. Exam Location:  Inpatient Procedure: 2D Echo, Color Doppler and Cardiac Doppler Indications:    CVA  History:        Patient has prior history of Echocardiogram examinations, most                 recent 04/12/2017. Risk Factors:Dyslipidemia.  Sonographer:    Gertie Fey MHA, RDMS, RVT, RDCS Referring Phys: [id number] [facility]  A SMITH  Sonographer Comments: Image acquisition challenging due to respiratory motion. IMPRESSIONS  1. Left ventricular ejection fraction, by estimation, is 60 to 65%. The left ventricle has normal function. The left ventricle has no regional wall motion abnormalities. Left ventricular diastolic parameters were normal.  2. Right ventricular systolic function is normal. The right ventricular size is normal.  3. The mitral valve is normal in structure. No evidence of mitral valve regurgitation. No evidence of mitral stenosis.  4. The aortic valve is normal in structure. Aortic valve regurgitation is trivial. No aortic stenosis  is present. Comparison(s): Prior images unable to be directly viewed, comparison made by report only. Conclusion(s)/Recommendation(s): No intracardiac source of embolism detected on this transthoracic study. Consider a transesophageal echocardiogram to exclude cardiac source of embolism if clinically indicated. FINDINGS  Left Ventricle: Left ventricular ejection fraction, by estimation, is 60 to 65%. The left ventricle has normal function. The left ventricle has no regional wall motion abnormalities. The left ventricular internal cavity size was normal in size. There is  no left ventricular hypertrophy. Left ventricular diastolic parameters were normal. Right Ventricle: The right ventricular size is normal. Right ventricular systolic function is normal. Left Atrium: Left atrial size was normal in size. Right Atrium: Right atrial size was normal in size. Pericardium: There is no evidence of pericardial effusion. Mitral Valve: The mitral valve is normal in structure. No evidence of mitral valve regurgitation. No evidence of mitral valve stenosis. Tricuspid Valve: The tricuspid valve is normal in structure. Tricuspid valve regurgitation is trivial. No evidence of tricuspid stenosis. Aortic Valve: The aortic valve is normal in structure. Aortic valve regurgitation is trivial. No aortic stenosis is  present. Aortic valve mean gradient measures 2.0 mmHg. Aortic valve peak gradient measures 3.7 mmHg. Aortic valve area, by VTI measures 3.21 cm. Pulmonic Valve: The pulmonic valve was normal in structure. Pulmonic valve regurgitation is not visualized. No evidence of pulmonic stenosis. Aorta: The aortic root is normal in size and structure. Venous: The inferior vena cava was not well visualized. IAS/Shunts: No atrial level shunt detected by color flow Doppler.  LEFT VENTRICLE PLAX 2D LVIDd:         4.80 cm   Diastology LVIDs:         3.50 cm   LV e' medial:    9.57 cm/s LV PW:         0.60 cm   LV E/e' medial:  10.2 LV IVS:        0.40 cm   LV e' lateral:   9.36 cm/s LVOT diam:     2.40 cm   LV E/e' lateral: 10.5 LV SV:         61 LV SV Index:   29 LVOT Area:     4.52 cm  RIGHT VENTRICLE RV S prime:     7.72 cm/s TAPSE (M-mode): 2.5 cm LEFT ATRIUM             Index        RIGHT ATRIUM          Index LA diam:        2.40 cm 1.16 cm/m   RA Area:     7.77 cm LA Vol (A2C):   22.9 ml 11.06 ml/m  RA Volume:   10.60 ml 5.12 ml/m LA Vol (A4C):   28.7 ml 13.86 ml/m LA Biplane Vol: 26.4 ml 12.75 ml/m  AORTIC VALVE AV Area (Vmax):    3.59 cm AV Area (Vmean):   3.61 cm AV Area (VTI):     3.21 cm AV Vmax:           96.50 cm/s AV Vmean:          72.900 cm/s AV VTI:            0.189 m AV Peak Grad:      3.7 mmHg AV Mean Grad:      2.0 mmHg LVOT Vmax:         76.50 cm/s LVOT Vmean:        58.200 cm/s LVOT  VTI:          0.134 m LVOT/AV VTI ratio: 0.71  AORTA Ao Root diam: 3.50 cm MITRAL VALVE MV Area (PHT): 2.57 cm    SHUNTS MV Decel Time: 295 msec    Systemic VTI:  0.13 m MV E velocity: 97.90 cm/s  Systemic Diam: 2.40 cm MV A velocity: 74.70 cm/s MV E/A ratio:  1.31 Olga Millers MD Electronically signed by Olga Millers MD Signature Date/Time: 03/22/2021/3:06:20 PM    Final    CT HEAD CODE STROKE WO CONTRAST  Result Date: 03/22/2021 CLINICAL DATA:  Code stroke. 45 year old male with blurred vision and right side  deficits. EXAM: CT HEAD WITHOUT CONTRAST TECHNIQUE: Contiguous axial images were obtained from the base of the skull through the vertex without intravenous contrast. COMPARISON:  Brain MRI 05/29/2017.  Head CT 10/29/2019. FINDINGS: Brain: Stable cerebral volume since 2021, stable and normal cerebral volume. No midline shift, ventriculomegaly, mass effect, evidence of mass lesion, intracranial hemorrhage or evidence of cortically based acute infarction. Gray-white matter differentiation is within normal limits throughout the brain, with subtle encephalomalacia of the dorsal right medulla better demonstrated by MRI. Vascular: No suspicious intracranial vascular hyperdensity. Skull: Congenital incomplete ossification of the posterior C1 ring. No acute osseous abnormality identified. Sinuses/Orbits: Mild to moderate scattered new posterior ethmoid and sphenoid sinus mucosal thickening. Tympanic cavities and mastoids remain clear. Other: Visualized orbits and scalp soft tissues are within normal limits. ASPECTS [facility] Stroke Program Early CT Score) Total score (0-10 with 10 being normal): 10 IMPRESSION: 1. Stable since 2021 and virtually normal noncontrast CT appearance of the brain. ASPECTS 10. 2. Mild to moderate new ethmoid and sphenoid paranasal sinus disease. 3. These results were communicated to Dr. Wilford Corner at 5:39 am on 03/22/2021 by text page via the [facility] messaging system. Electronically Signed   By: Odessa Fleming M.D.   On: 03/22/2021 05:39   CT ANGIO HEAD NECK W WO CM (CODE STROKE)  Result Date: 03/22/2021 CLINICAL DATA:  45 year old male with a history of abnormal distal right vertebral artery. Right PICA infarct in 2018. Code stroke presentation. EXAM: CT ANGIOGRAPHY HEAD AND NECK TECHNIQUE: Multidetector CT imaging of the head and neck was performed using the standard protocol during bolus administration of intravenous contrast. Multiplanar CT image reconstructions and MIPs were obtained to evaluate the  vascular anatomy. Carotid stenosis measurements (when applicable) are obtained utilizing NASCET criteria, using the distal internal carotid diameter as the denominator. CONTRAST:  100 mL Omnipaque 350 COMPARISON:  CTA head and neck 10/29/2019.  Plain head CT today. FINDINGS: CTA NECK Skeleton: No acute osseous abnormality identified. Congenital incomplete ossification of the posterior C1 ring. New right maxillary, posterior ethmoid and sphenoid sinus mucosal thickening. Upper chest: Negative. Other neck: Within normal limits. Aortic arch: 3 vessel arch configuration.  No arch atherosclerosis. Right carotid system: Negative. Left carotid system: Negative. Vertebral arteries: Normal proximal right subclavian artery and right vertebral artery origin. Non dominant appearance of the right vertebral artery not significantly changed since 2021. The vessel remains patent to the skull base with no stenosis. Normal proximal left subclavian artery and left vertebral artery origin. Stable dominant appearance of the left vertebral artery in the V1 and V2 segments. However, new distal left vertebral artery dissection and high-grade stenosis beginning at the left V3 segment with subtotal thrombosed lumen at the C1 level. See series 7, images 170-161. Improved enhancement of the vessel at the skull base. Preliminary results of the above were communicated to Dr. Wilford Corner  at 5:47 am on 03/22/2021 by text page via the [facility] messaging system. CTA HEAD Posterior circulation: Despite the left V3 appearance the left vertebral V4 segment remains patent and appears fairly stable compared to 2021. Left PICA and left vertebrobasilar junction remain patent. Chronic diminutive distal right vertebral which terminates in PICA, chronic right V4 segment occlusion distal to the right PICA. Stable diminutive and mildly irregular basilar artery enhancement to the basilar tip. Bilateral SCA and PCA origins are stable and patent. Both posterior  communicating arteries present as before. Bilateral PCA branches appear stable since 2021, with mild irregularity bilaterally. Anterior circulation: Both ICA siphons are patent and within normal limits. Normal posterior communicating and left ophthalmic artery origins. Patent carotid termini. MCA and ACA origins remain normal. Normal anterior communicating artery. ACA branches are stable, the right A2 is dominant. Bilateral MCA M1 segments and bifurcations are patent. Bilateral MCA branches are stable since 2021. Venous sinuses: Early contrast timing. The superior sagittal sinus is patent. Anatomic variants: None. Review of the MIP images confirms the above findings IMPRESSION: 1. Acute Dissection Distal Left Vertebral Artery, in the V3 segment with associated high-grade stenosis. This left vertebral is the sole supply to the Basilar Artery, but the left V4 segment and remaining posterior circulation appear stable since a 2021 CTA. 2. Chronically occluded right vertebral V4 segment. Stable diminutive right vertebral artery terminating in PICA. 3. Bilateral carotid arteries appear stable and negative. 4. But mild generalized irregular appearance of the anterior and posterior circulation vessels is noted and stable since 2021. Study discussed by telephone with Dr. Milon Dikes on 03/22/2021 at 05:52 . Electronically Signed   By: Odessa Fleming M.D.   On: 03/22/2021 05:55     PHYSICAL EXAM  Temp:  [98 F (36.7 C)-98.5 F (36.9 C)] 98.5 F (36.9 C) (12/01 0820) Pulse Rate:  [76-109] 92 (12/01 1600) Resp:  [11-22] 11 (12/01 1600) BP: (115-144)/(77-102) 126/77 (12/01 1600) SpO2:  [93 %-98 %] 96 % (12/01 1600)  General - Well nourished, well developed, in no apparent distress.  Ophthalmologic - fundi not visualized due to noncooperation.  Cardiovascular - Regular rhythm and rate.  Mental Status -  Level of arousal and orientation to time, place, and person were intact. Language including expression, naming,  repetition, comprehension was assessed and found intact. Fund of Knowledge was assessed and was intact.  Cranial Nerves II - XII - II - Visual field intact OU. III, IV, VI - Extraocular movements intact. V - Facial sensation intact bilaterally. VII - Facial movement intact bilaterally. VIII - Hearing & vestibular intact bilaterally. X - Palate elevates symmetrically. XI - Chin turning & shoulder shrug intact bilaterally. XII - Tongue protrusion intact.  Motor Strength - The patient's strength was normal in all extremities and pronator drift was absent.  Bulk was normal and fasciculations were absent.   Motor Tone - Muscle tone was assessed at the neck and appendages and was normal.  Reflexes - The patient's reflexes were symmetrical in all extremities and he had no pathological reflexes.  Sensory - Light touch, temperature/pinprick were assessed and were symmetrical.    Coordination - The patient had normal movements in the hands and feet with no ataxia or dysmetria.  Tremor was absent.  Gait and Station - deferred.   ASSESSMENT/PLAN [name] is a 45 y.o. 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 No tPA given  due to on Xarelto.    Stroke:  left PICA and b/l SCA (L>R) infarcts embolic secondary to left VA dissection, possibly combination of severe cough from COVID and recent aggressive work out CT no acute abnormality CTA head and neck left V3 dissection with stenosis, right V4 chronic occlusion vs. 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 Right V3 remains patent. MRI left PICA infarct, bilateral SCA infarcts left more than right 2D Echo EF 60 to 65% LE venous Doppler no DVT LDL 59 HgbA1c 5.2 Heparin IV for VTE prophylaxis Xarelto (rivaroxaban) daily prior to admission, now on heparin IV.  Once tolerating heparin IV, will transition back to Xarelto tomorrow.   Will consider to add ASA 81, and repeat CTA in 2 to 3 months, if left VA recannulizes, aspirin can be discontinued. Patient counseled to be compliant with his antithrombotic medications Ongoing aggressive stroke risk factor management Therapy recommendations: Pending Disposition: Pending  History of stroke 03/2017 admitted for dizziness, off balance, headache, right facial numbness.  MRI showed right PICA infarct including cerebellum and lateral medullar.  MRA showed right V3/V4 occlusion, CTA neck redemonstrated right V3 occlusion.  EF normal range, A1c 5.2, LDL 57.  TEE showed small PFO, DVT negative.  Was put on heparin IV initially but then discharged on DAPT for 3 months and then Crestor 20. 05/2017 admitted for recurrent symptoms with dizziness and left facial numbness, MRI negative for acute stroke.  CTA showed partial recannulized right V3, ends up at PICA. 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 Also followed up with Dr. Pearlean Brownie at [facility] and Dr. Jacinto Halim cardiology over the years  Hyperlipidemia High Lp(a) Home meds: Repatha and Crestor 20 LDL 59, goal < 70 On clinical trials for high Lp(a) with [facility] Now on Crestor 20 Continue statin at discharge, continue Repatha at home  Other Stroke Risk Factors Family hx stroke (sister had MI without any known medical condition, mother and grandpa died of stroke and MI) Small PFO - negative for DVT this time COVID one week ago with severe cough Aggressive physical work up prior to current and previous stroke  Other Active Problems   Hospital day # 0  I had long discussion with patient and wife at bedside, reviewed neuro images, updated pt current condition, treatment plan and potential prognosis, and answered all the questions.  They expressed understanding and appreciation.  I discussed with Dr. Shirlee Latch and Dr.  Katrinka Blazing. I spent  35 minutes in total face-to-face time with the patient, more than 50% of which was spent in counseling and coordination of care, reviewing test results, images and medication, and discussing the diagnosis, treatment plan and potential prognosis. This patient's care requiresreview of multiple databases, neurological assessment, discussion with family, other specialists and medical decision making of high complexity.   Marvel Plan, MD PhD Stroke Neurology 03/22/2021 4:25 PM    To contact Stroke Continuity provider, please refer to [email or web address]. After hours, contact General Neurology

## 2021-03-22 NOTE — Code Documentation (Signed)
Responded to Code Stroke called at 0518 on pt already in the ED. Code Stroke was called for R facial droop, R sided tingling, and blurred vision, [id number]. NIH-1 for sensory deficit on the R side, CT head negative for acute changes, CTA-Acute Dissection Distal Left Vertebral Artery, MRI-acute infarcts in cerebellum. TNK not given-pt is on xarelto. Plan heparin gtt and stroke workup.

## 2021-03-23 DIAGNOSIS — U071 COVID-19: Secondary | ICD-10-CM

## 2021-03-23 DIAGNOSIS — I639 Cerebral infarction, unspecified: Secondary | ICD-10-CM | POA: Diagnosis not present

## 2021-03-23 LAB — BASIC METABOLIC PANEL
Anion gap: 6 (ref 5–15)
BUN: 9 mg/dL (ref 6–20)
CO2: 22 mmol/L (ref 22–32)
Calcium: 8.8 mg/dL — ABNORMAL LOW (ref 8.9–10.3)
Chloride: 109 mmol/L (ref 98–111)
Creatinine, Ser: 0.82 mg/dL (ref 0.61–1.24)
GFR, Estimated: >60 mL/min (ref >60)
Glucose, Bld: 85 mg/dL (ref 70–99)
Potassium: 4.1 mmol/L (ref 3.5–5.1)
Sodium: 137 mmol/L (ref 135–145)

## 2021-03-23 LAB — HEPARIN LEVEL (UNFRACTIONATED): Heparin Unfractionated: 0.47 IU/mL (ref 0.30–0.70)

## 2021-03-23 LAB — APTT: aPTT: 48 seconds — ABNORMAL HIGH (ref 24–36)

## 2021-03-23 LAB — PHOSPHORUS: Phosphorus: 3.6 mg/dL (ref 2.5–4.6)

## 2021-03-23 LAB — MAGNESIUM: Magnesium: 1.9 mg/dL (ref 1.7–2.4)

## 2021-03-23 MED ORDER — RIVAROXABAN 20 MG PO TABS
20.0000 mg | ORAL_TABLET | Freq: Every day | ORAL | Status: DC
  Administered 2021-03-23: 20 mg via ORAL
  Filled 2021-03-23: qty 1

## 2021-03-23 MED ORDER — BENZONATATE 100 MG PO CAPS
100.0000 mg | ORAL_CAPSULE | Freq: Two times a day (BID) | ORAL | Status: DC | PRN
  Administered 2021-03-23: 100 mg via ORAL
  Filled 2021-03-23: qty 1

## 2021-03-23 MED ORDER — POTASSIUM CHLORIDE 20 MEQ PO PACK
20.0000 meq | PACK | Freq: Once | ORAL | Status: AC
  Administered 2021-03-23: 20 meq via ORAL
  Filled 2021-03-23: qty 1

## 2021-03-23 NOTE — Progress Notes (Addendum)
Advanced Heart Failure Rounding Note  PCP-Cardiologist: None   Subjective:    Feeling better today.  Denies dizziness.   Echo EF 60-65% RV normal. No thrombus.     Objective:   Weight Range: 88.6 kg Body mass index is 28.03 kg/m.   Vital Signs:   Temp:  [97.9 F (36.6 C)-98.6 F (37 C)] 98.1 F (36.7 C) (12/02 0805) Pulse Rate:  [67-109] 67 (12/02 0805) Resp:  [10-22] 10 (12/02 0805) BP: (107-144)/(76-102) 118/85 (12/02 0805) SpO2:  [94 %-99 %] 95 % (12/02 0805) Weight:  [88.6 kg] 88.6 kg (12/01 1932) Last BM Date: 03/21/21  Weight change: Filed Weights   03/22/21 1932  Weight: 88.6 kg    Intake/Output:   Intake/Output Summary (Last 24 hours) at 03/23/2021 0821 Last data filed at 03/23/2021 0521 Gross per 24 hour  Intake 288.12 ml  Output --  Net 288.12 ml      Physical Exam    General:  Well appearing. No resp difficulty HEENT: Normal Neck: Supple. JVP flat  . Carotids 2+ bilat; no bruits. No lymphadenopathy or thyromegaly appreciated. Cor: PMI nondisplaced. Regular rate & rhythm. No rubs, gallops or murmurs. Lungs: Clear Abdomen: Soft, nontender, nondistended. No hepatosplenomegaly. No bruits or masses. Good bowel sounds. Extremities: No cyanosis, clubbing, rash, edema Neuro: Alert & orientedx3, cranial nerves grossly intact. moves all 4 extremities w/o difficulty. Affect pleasant   Telemetry  SR 70-80s personally reviewed.    EKG   N/A  Labs    CBC Recent Labs    03/22/21 0524 03/22/21 0533  WBC 6.2  --   NEUTROABS 2.6  --   HGB 15.3 15.0  HCT 43.9 44.0  MCV 90.5  --   PLT 227  --    Basic Metabolic Panel Recent Labs    24/58/09 0524 03/22/21 0533 03/23/21 0118  NA 140 141  --   K 3.4* 3.4*  --   CL 108 108  --   CO2 18*  --   --   GLUCOSE 113* 110*  --   BUN 12 12  --   CREATININE 0.97 0.80  --   CALCIUM 8.8*  --   --   MG  --   --  1.9  PHOS  --   --  3.6   Liver Function Tests Recent Labs    03/22/21 0524   AST 49*  ALT 30  ALKPHOS 63  BILITOT 0.8  PROT 6.6  ALBUMIN 4.0   No results for input(s): LIPASE, AMYLASE in the last 72 hours. Cardiac Enzymes No results for input(s): CKTOTAL, CKMB, CKMBINDEX, TROPONINI in the last 72 hours.  BNP: BNP (last 3 results) No results for input(s): BNP in the last 8760 hours.  ProBNP (last 3 results) No results for input(s): PROBNP in the last 8760 hours.   D-Dimer No results for input(s): DDIMER in the last 72 hours. Hemoglobin A1C Recent Labs    03/22/21 0833  HGBA1C 5.2   Fasting Lipid Panel Recent Labs    03/22/21 0833  CHOL 133  HDL 65  LDLCALC 59  TRIG 43  CHOLHDL 2.0   Thyroid Function Tests No results for input(s): TSH, T4TOTAL, T3FREE, THYROIDAB in the last 72 hours.  Invalid input(s): FREET3  Other results:   Imaging    ECHOCARDIOGRAM COMPLETE  Result Date: 03/22/2021    ECHOCARDIOGRAM REPORT   Patient Name:   Jake Moore [facility] Date of Exam: 03/22/2021 Medical Rec #:  [id number]  Height:       70.0 in Accession #:    [id number]        Weight:       196.2 lb Date of Birth:  1976-01-14         BSA:          2.070 m Patient Age:    44 years          BP:           125/90 mmHg Patient Gender: M                 HR:           71 bpm. Exam Location:  Inpatient Procedure: 2D Echo, Color Doppler and Cardiac Doppler Indications:    CVA  History:        Patient has prior history of Echocardiogram examinations, most                 recent 04/12/2017. Risk Factors:Dyslipidemia.  Sonographer:    Gertie Fey MHA, RDMS, RVT, RDCS Referring Phys: [id number] [facility] A SMITH  Sonographer Comments: Image acquisition challenging due to respiratory motion. IMPRESSIONS  1. Left ventricular ejection fraction, by estimation, is 60 to 65%. The left ventricle has normal function. The left ventricle has no regional wall motion abnormalities. Left ventricular diastolic parameters were normal.  2. Right ventricular systolic function is normal.  The right ventricular size is normal.  3. The mitral valve is normal in structure. No evidence of mitral valve regurgitation. No evidence of mitral stenosis.  4. The aortic valve is normal in structure. Aortic valve regurgitation is trivial. No aortic stenosis is present. Comparison(s): Prior images unable to be directly viewed, comparison made by report only. Conclusion(s)/Recommendation(s): No intracardiac source of embolism detected on this transthoracic study. Consider a transesophageal echocardiogram to exclude cardiac source of embolism if clinically indicated. FINDINGS  Left Ventricle: Left ventricular ejection fraction, by estimation, is 60 to 65%. The left ventricle has normal function. The left ventricle has no regional wall motion abnormalities. The left ventricular internal cavity size was normal in size. There is  no left ventricular hypertrophy. Left ventricular diastolic parameters were normal. Right Ventricle: The right ventricular size is normal. Right ventricular systolic function is normal. Left Atrium: Left atrial size was normal in size. Right Atrium: Right atrial size was normal in size. Pericardium: There is no evidence of pericardial effusion. Mitral Valve: The mitral valve is normal in structure. No evidence of mitral valve regurgitation. No evidence of mitral valve stenosis. Tricuspid Valve: The tricuspid valve is normal in structure. Tricuspid valve regurgitation is trivial. No evidence of tricuspid stenosis. Aortic Valve: The aortic valve is normal in structure. Aortic valve regurgitation is trivial. No aortic stenosis is present. Aortic valve mean gradient measures 2.0 mmHg. Aortic valve peak gradient measures 3.7 mmHg. Aortic valve area, by VTI measures 3.21 cm. Pulmonic Valve: The pulmonic valve was normal in structure. Pulmonic valve regurgitation is not visualized. No evidence of pulmonic stenosis. Aorta: The aortic root is normal in size and structure. Venous: The inferior vena  cava was not well visualized. IAS/Shunts: No atrial level shunt detected by color flow Doppler.  LEFT VENTRICLE PLAX 2D LVIDd:         4.80 cm   Diastology LVIDs:         3.50 cm   LV e' medial:    9.57 cm/s LV PW:         0.60 cm  LV E/e' medial:  10.2 LV IVS:        0.40 cm   LV e' lateral:   9.36 cm/s LVOT diam:     2.40 cm   LV E/e' lateral: 10.5 LV SV:         61 LV SV Index:   29 LVOT Area:     4.52 cm  RIGHT VENTRICLE RV S prime:     7.72 cm/s TAPSE (M-mode): 2.5 cm LEFT ATRIUM             Index        RIGHT ATRIUM          Index LA diam:        2.40 cm 1.16 cm/m   RA Area:     7.77 cm LA Vol (A2C):   22.9 ml 11.06 ml/m  RA Volume:   10.60 ml 5.12 ml/m LA Vol (A4C):   28.7 ml 13.86 ml/m LA Biplane Vol: 26.4 ml 12.75 ml/m  AORTIC VALVE AV Area (Vmax):    3.59 cm AV Area (Vmean):   3.61 cm AV Area (VTI):     3.21 cm AV Vmax:           96.50 cm/s AV Vmean:          72.900 cm/s AV VTI:            0.189 m AV Peak Grad:      3.7 mmHg AV Mean Grad:      2.0 mmHg LVOT Vmax:         76.50 cm/s LVOT Vmean:        58.200 cm/s LVOT VTI:          0.134 m LVOT/AV VTI ratio: 0.71  AORTA Ao Root diam: 3.50 cm MITRAL VALVE MV Area (PHT): 2.57 cm    SHUNTS MV Decel Time: 295 msec    Systemic VTI:  0.13 m MV E velocity: 97.90 cm/s  Systemic Diam: 2.40 cm MV A velocity: 74.70 cm/s MV E/A ratio:  1.31 Olga Millers MD Electronically signed by Olga Millers MD Signature Date/Time: 03/22/2021/3:06:20 PM    Final    VAS [place] LOWER EXTREMITY VENOUS (DVT)  Result Date: 03/22/2021  Lower Venous DVT Study Patient Name:  BALLARD BUDNEY [facility]  Date of Exam:   03/22/2021 Medical Rec #: [id number]          Accession #:    [id number] Date of Birth: 02/13/1976          Patient Gender: M Patient Age:   78 years Exam Location:  [facility] Procedure:      VAS [place] LOWER EXTREMITY VENOUS (DVT) Referring Phys: Marca Ancona --------------------------------------------------------------------------------  Indications: Stroke.  Risk  Factors: Lp(a) elevation, covid+. Anticoagulation: Xarelto. Comparison Study: Previous exam on 04/14/2017 was negative for DVT. Performing Technologist: Ernestene Mention RVT, RDMS  Examination Guidelines: A complete evaluation includes B-mode imaging, spectral Doppler, color Doppler, and power Doppler as needed of all accessible portions of each vessel. Bilateral testing is considered an integral part of a complete examination. Limited examinations for reoccurring indications may be performed as noted. The reflux portion of the exam is performed with the patient in reverse Trendelenburg.  +---------+---------------+---------+-----------+----------+--------------+ RIGHT    CompressibilityPhasicitySpontaneityPropertiesThrombus Aging +---------+---------------+---------+-----------+----------+--------------+ CFV      Full           Yes      Yes                                 +---------+---------------+---------+-----------+----------+--------------+  SFJ      Full                                                        +---------+---------------+---------+-----------+----------+--------------+ FV Prox  Full           Yes      Yes                                 +---------+---------------+---------+-----------+----------+--------------+ FV Mid   Full           Yes      Yes                                 +---------+---------------+---------+-----------+----------+--------------+ FV DistalFull           Yes      Yes                                 +---------+---------------+---------+-----------+----------+--------------+ PFV      Full                                                        +---------+---------------+---------+-----------+----------+--------------+ POP      Full           Yes      Yes                                 +---------+---------------+---------+-----------+----------+--------------+ PTV      Full                                                         +---------+---------------+---------+-----------+----------+--------------+ PERO     Full                                                        +---------+---------------+---------+-----------+----------+--------------+   +---------+---------------+---------+-----------+----------+--------------+ LEFT     CompressibilityPhasicitySpontaneityPropertiesThrombus Aging +---------+---------------+---------+-----------+----------+--------------+ CFV      Full           Yes      Yes                                 +---------+---------------+---------+-----------+----------+--------------+ SFJ      Full                                                        +---------+---------------+---------+-----------+----------+--------------+  FV Prox  Full           Yes      Yes                                 +---------+---------------+---------+-----------+----------+--------------+ FV Mid   Full           Yes      Yes                                 +---------+---------------+---------+-----------+----------+--------------+ FV DistalFull           Yes      Yes                                 +---------+---------------+---------+-----------+----------+--------------+ PFV      Full                                                        +---------+---------------+---------+-----------+----------+--------------+ POP      Full           Yes      Yes                                 +---------+---------------+---------+-----------+----------+--------------+ PTV      Full                                                        +---------+---------------+---------+-----------+----------+--------------+ PERO     Full                                                        +---------+---------------+---------+-----------+----------+--------------+     Summary: BILATERAL: - No evidence of deep vein thrombosis seen in the lower extremities, bilaterally. - No  evidence of superficial venous thrombosis in the lower extremities, bilaterally. -No evidence of popliteal cyst, bilaterally.   *See table(s) above for measurements and observations. Electronically signed by Sherald Hess MD on 03/22/2021 at 5:27:59 PM.    Final      Medications:     Scheduled Medications:   stroke: mapping our early stages of recovery book   Does not apply Once   vitamin C  500 mg Oral Daily   aspirin EC  81 mg Oral Daily   FLUoxetine  10 mg Oral QPM   folic acid  1 mg Oral Daily   multivitamin with minerals  1 tablet Oral Daily   rosuvastatin  20 mg Oral Daily   thiamine  100 mg Oral Daily   Or   thiamine  100 mg Intravenous Daily   zinc sulfate  220 mg Oral Daily    Infusions:  heparin 1,300 Units/hr (03/23/21 0521)    PRN Medications: acetaminophen **OR** acetaminophen (TYLENOL) oral liquid 160 mg/5  mL **OR** acetaminophen, albuterol, guaiFENesin, LORazepam **OR** LORazepam, ondansetron (ZOFRAN) IV    Assessment/Plan   1. 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  He did not receive thrombolytics.  He does not report heavy lifting/straining or neck manipulation.  ?Related to inflammation with recent COVID-19. Sister had a coronary event that may have been SCAD but not totally clear.   - Neurology managing, did not think there was a good interventional option.  - he remains on heparin gtt, anticoagulant regimen per neurology going forwards.  He has been on Xarelto in the past post-CVA. Adding aspirin per Neuro.  - Per Dr Shirlee Latch think that the small PFO noted in 2018 is likely unrelated given appearance of vertebral dissection.  However, Lp(a) elevation makes him relatively hypercoagulable so will order lower extremity venous dopplers (unlikely to be  positive with Xarelto use).  ? Linq beneficial. Dr Shirlee Latch will discuss with neurology.  - Echo EF 60-65% RV normal. No thrombus.    2. Abnormal ECG: He has Q in lead III that was seen on prior ECGs.  Doubt cardiac ischemia.  3. Elevated Lp(a): He is on Crestor and Repatha, is in a clinical trial of Lp(a) lowering drug.  LDL is 59 (well-controlled).  4. Elevated AST: Suspect ETOH-related. Needs to cut back.     Length of Stay: 1  Amy Clegg, NP  03/23/2021, 8:21 AM  Advanced Heart Failure Team Pager [phone] (M-F; 7a - 5p)  Please contact [facility] for night-coverage after hours (5p -7a ) and weekends on [email or web address]  Patient seen with NP, agree with the above note.   Patient's event appears to be due to recurrent vertebral dissection.  Discussed with neurology, will have him take ASA 81 and Xarelto at home.  Echo was normal.  Carotids visualized on CTA head and neck, no evidence for fibromuscular dysplasia.  Would avoid heavy lifting and strain.  Do not think he needs LINQ monitor as pretty clear that this event was due to vertebral dissection.   Minimally symptomatic today, walking without dizziness/imbalance.  Think he can go home.   Marca Ancona 03/23/2021 4:05 PM

## 2021-03-23 NOTE — Progress Notes (Addendum)
[facility] PROGRESS NOTE    Progress Note  Jake Moore  ZOX:[id number] DOB: 02/02/1976 DOA: 03/22/2021 PCP: Cleatis PolkaShaw, William D Jr., MD     Brief Narrative:   Jake Moore is an 45 y.o. male past medical history significant for CVA, hyperlipidemia anxiety, who recently tested positive on 03/13/2021 for COVID-19 who comes in complaining of dizziness and right-sided numbness MRI of the brain showed new acute infarct in the cerebellum left SCA territory ,left posterior cerebellum and right SCA territory, CT of the head showed no acute intracranial findings, CT angio of the head and neck show acute dissection of the distal left vertebral artery in the V3 segment, carotid arteries appears to be stable negative.   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 PT, OT, pending CT angio of the head and neck showed acute dissection of the distal left vertebral artery in the V3 segment Transthoracic Echo, showed an EF of 60% no wall motion abnormalities, no abnormality in the valves. Neurology was consulted recommended IV heparin and can be transition to Xarelto today. They are thinking about starting aspirin and repeating CT angio of the head and neck in 3 months Continue statins.  BP goal: permissive HTN upto 220/120 mmHg Lower extremity Doppler was negative for DVT.   COVID-19 viral infection: Diagnosed at his PCPs office on November 22. Reading test in the hospital was positive. Started on inhalers for shortness of breath.  Vitamin C and zinc continue Mucinex for cough.  Hypokalemia: Replete orally, basic metabolic panels pending this morning.  History of CVA: With a prior history of right vertebral artery dissection and stroke back in 2020.  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 Continue statins.  Hyperlipidemia:    continue statins.  Alcohol abuse: On admission EtOH level was 20, admits to drinking 3 to 4 glasses of wine daily. Started on thiamine and folate and CIWA protocol.  Elevated LFTs: Mild likely due to alcohol abuse.  General anxiety: Continue Prozac.  DVT prophylaxis: heparin Family Communication:wife Status is: Inpatient  Remains inpatient appropriate because: Acute CVA        Code Status:     Code Status Orders  (From admission, onward)           Start     Ordered   03/22/21 0737  Full code  Continuous        03/22/21 0737           Code Status History     Date Active Date Inactive Code Status Order ID Comments User Context   05/29/2017 2236 05/30/2017 1711 Full Code [id number]  Eduard ClosKakrakandy, Arshad N, MD ED   04/17/2017 1527 04/21/2017 1702 Full Code [id number]  Charlton AmorAngiulli, Daniel J, PA-C Inpatient   04/17/2017 1527 04/17/2017 1527 Full Code [id number]  Charlton Amorngiulli, Daniel J, PA-C Inpatient   04/11/2017 2117 04/17/2017 1510 Full Code [id number]  Haydee Monicaavid, Rachal A, MD ED      Advance Directive Documentation    Flowsheet Row Most Recent Value  Type of Advance Directive Healthcare Power of Attorney, Living will  Pre-existing out of facility DNR order (yellow form or pink MOST form) --  "MOST" Form in Place? --         IV Access:   Peripheral IV   Procedures and diagnostic studies:   MR BRAIN WO CONTRAST  Result Date: 03/22/2021 CLINICAL DATA:  45 year old male with a history distal right vertebral artery occlusion and right PICA infarct in 2018. Code stroke presentation, with acute distal left vertebral artery dissection on CTA. EXAM: MRI HEAD WITHOUT CONTRAST TECHNIQUE: Multiplanar, multiecho pulse sequences of the brain and surrounding structures were obtained without intravenous contrast. COMPARISON:  CTA head and neck today.  Brain MRI 05/29/2017. FINDINGS: Brain: There is patchy mildly to moderately restricted diffusion in the left superior cerebellum SCA  territory (series 5, image 74) with additional posterior central cerebellar hemisphere restriction (series 5, image 68). No medulla or brainstem involvement identified. However, there is a subtle area of contralateral right SCA involvement (series 5, image 72). No thalamic or other posterior circulation restricted diffusion. And only subtle FLAIR hyperintensity in the areas of abnormal cerebellar diffusion. No associated hemorrhage. A small chronic area of cystic encephalomalacia in the right lateral medulla has decreased since 2019 and is subtle now (series 15, image 6). Supratentorial gray and white matter signal remains normal. No midline shift, mass effect, evidence of mass lesion, ventriculomegaly, extra-axial collection or acute intracranial hemorrhage. Cervicomedullary junction and pituitary are within normal limits. Vascular: Major intracranial vascular flow voids are stable despite the abnormal left vertebral V3 segment which is partially visible on series 15, image 3 of this exam. Skull and upper cervical spine: Negative. Visualized bone marrow signal is within normal limits. Sinuses/Orbits: Negative orbits. Mild to moderate paranasal sinus mucosal thickening as on the earlier CT. Other: Mastoids are clear. Visible internal auditory structures appear normal. IMPRESSION: 1. Patchy acute infarcts in the Cerebellum. Left SCA territory primarily affected, but lesser involvement in the left posterior cerebellum and right SCA territory. No associated hemorrhage or mass effect. 2. No other acute intracranial abnormality. Subtle chronic encephalomalacia in the right lateral medulla. Electronically Signed   By: Odessa Fleming M.D.   On: 03/22/2021 06:24   DG CHEST PORT 1 VIEW  Result Date: 03/22/2021 CLINICAL DATA:  COVID-19 EXAM: PORTABLE CHEST 1 VIEW COMPARISON:  Chest x-ray dated July 14, 2016 FINDINGS: The heart size and mediastinal contours are within normal limits. Both lungs are clear. The visualized skeletal  structures are unremarkable. IMPRESSION: No active disease. Electronically Signed   By: Allegra Lai M.D.   On: 03/22/2021 08:05   ECHOCARDIOGRAM COMPLETE  Result Date: 03/22/2021    ECHOCARDIOGRAM REPORT   Patient Name:   Jake Moore [facility] Date of Exam: 03/22/2021 Medical Rec #:  [id number]         Height:       70.0 in Accession #:    [id number]        Weight:       196.2 lb Date of Birth:  08-09-1975         BSA:          2.070 m Patient Age:    44 years          BP:           125/90 mmHg Patient Gender: M                 HR:           71 bpm. Exam Location:  Inpatient Procedure: 2D Echo, Color Doppler and Cardiac Doppler Indications:    CVA  History:        Patient has prior history of Echocardiogram examinations, most                 recent 04/12/2017. Risk Factors:Dyslipidemia.  Sonographer:  Maudry Mayhew MHA, RDMS, RVT, RDCS Referring Phys: [id number] [facility] A SMITH  Sonographer Comments: Image acquisition challenging due to respiratory motion. IMPRESSIONS  1. Left ventricular ejection fraction, by estimation, is 60 to 65%. The left ventricle has normal function. The left ventricle has no regional wall motion abnormalities. Left ventricular diastolic parameters were normal.  2. Right ventricular systolic function is normal. The right ventricular size is normal.  3. The mitral valve is normal in structure. No evidence of mitral valve regurgitation. No evidence of mitral stenosis.  4. The aortic valve is normal in structure. Aortic valve regurgitation is trivial. No aortic stenosis is present. Comparison(s): Prior images unable to be directly viewed, comparison made by report only. Conclusion(s)/Recommendation(s): No intracardiac source of embolism detected on this transthoracic study. Consider a transesophageal echocardiogram to exclude cardiac source of embolism if clinically indicated. FINDINGS  Left Ventricle: Left ventricular ejection fraction, by estimation, is 60 to 65%. The left ventricle  has normal function. The left ventricle has no regional wall motion abnormalities. The left ventricular internal cavity size was normal in size. There is  no left ventricular hypertrophy. Left ventricular diastolic parameters were normal. Right Ventricle: The right ventricular size is normal. Right ventricular systolic function is normal. Left Atrium: Left atrial size was normal in size. Right Atrium: Right atrial size was normal in size. Pericardium: There is no evidence of pericardial effusion. Mitral Valve: The mitral valve is normal in structure. No evidence of mitral valve regurgitation. No evidence of mitral valve stenosis. Tricuspid Valve: The tricuspid valve is normal in structure. Tricuspid valve regurgitation is trivial. No evidence of tricuspid stenosis. Aortic Valve: The aortic valve is normal in structure. Aortic valve regurgitation is trivial. No aortic stenosis is present. Aortic valve mean gradient measures 2.0 mmHg. Aortic valve peak gradient measures 3.7 mmHg. Aortic valve area, by VTI measures 3.21 cm. Pulmonic Valve: The pulmonic valve was normal in structure. Pulmonic valve regurgitation is not visualized. No evidence of pulmonic stenosis. Aorta: The aortic root is normal in size and structure. Venous: The inferior vena cava was not well visualized. IAS/Shunts: No atrial level shunt detected by color flow Doppler.  LEFT VENTRICLE PLAX 2D LVIDd:         4.80 cm   Diastology LVIDs:         3.50 cm   LV e' medial:    9.57 cm/s LV PW:         0.60 cm   LV E/e' medial:  10.2 LV IVS:        0.40 cm   LV e' lateral:   9.36 cm/s LVOT diam:     2.40 cm   LV E/e' lateral: 10.5 LV SV:         61 LV SV Index:   29 LVOT Area:     4.52 cm  RIGHT VENTRICLE RV S prime:     7.72 cm/s TAPSE (M-mode): 2.5 cm LEFT ATRIUM             Index        RIGHT ATRIUM          Index LA diam:        2.40 cm 1.16 cm/m   RA Area:     7.77 cm LA Vol (A2C):   22.9 ml 11.06 ml/m  RA Volume:   10.60 ml 5.12 ml/m LA Vol (A4C):    28.7 ml 13.86 ml/m LA Biplane Vol: 26.4 ml 12.75 ml/m  AORTIC VALVE AV Area (  Vmax):    3.59 cm AV Area (Vmean):   3.61 cm AV Area (VTI):     3.21 cm AV Vmax:           96.50 cm/s AV Vmean:          72.900 cm/s AV VTI:            0.189 m AV Peak Grad:      3.7 mmHg AV Mean Grad:      2.0 mmHg LVOT Vmax:         76.50 cm/s LVOT Vmean:        58.200 cm/s LVOT VTI:          0.134 m LVOT/AV VTI ratio: 0.71  AORTA Ao Root diam: 3.50 cm MITRAL VALVE MV Area (PHT): 2.57 cm    SHUNTS MV Decel Time: 295 msec    Systemic VTI:  0.13 m MV E velocity: 97.90 cm/s  Systemic Diam: 2.40 cm MV A velocity: 74.70 cm/s MV E/A ratio:  1.31 Kirk Ruths MD Electronically signed by Kirk Ruths MD Signature Date/Time: 03/22/2021/3:06:20 PM    Final    CT HEAD CODE STROKE WO CONTRAST  Result Date: 03/22/2021 CLINICAL DATA:  Code stroke. 45 year old male with blurred vision and right side deficits. EXAM: CT HEAD WITHOUT CONTRAST TECHNIQUE: Contiguous axial images were obtained from the base of the skull through the vertex without intravenous contrast. COMPARISON:  Brain MRI 05/29/2017.  Head CT 10/29/2019. FINDINGS: Brain: Stable cerebral volume since 2021, stable and normal cerebral volume. No midline shift, ventriculomegaly, mass effect, evidence of mass lesion, intracranial hemorrhage or evidence of cortically based acute infarction. Gray-white matter differentiation is within normal limits throughout the brain, with subtle encephalomalacia of the dorsal right medulla better demonstrated by MRI. Vascular: No suspicious intracranial vascular hyperdensity. Skull: Congenital incomplete ossification of the posterior C1 ring. No acute osseous abnormality identified. Sinuses/Orbits: Mild to moderate scattered new posterior ethmoid and sphenoid sinus mucosal thickening. Tympanic cavities and mastoids remain clear. Other: Visualized orbits and scalp soft tissues are within normal limits. ASPECTS [facility] Stroke Program Early CT  Score) Total score (0-10 with 10 being normal): 10 IMPRESSION: 1. Stable since 2021 and virtually normal noncontrast CT appearance of the brain. ASPECTS 10. 2. Mild to moderate new ethmoid and sphenoid paranasal sinus disease. 3. These results were communicated to Dr. Rory Percy at 5:39 am on 03/22/2021 by text page via the [facility] messaging system. Electronically Signed   By: Genevie Ann M.D.   On: 03/22/2021 05:39   VAS [place] LOWER EXTREMITY VENOUS (DVT)  Result Date: 03/22/2021  Lower Venous DVT Study Patient Name:  Jake Moore [facility]  Date of Exam:   03/22/2021 Medical Rec #: [id number]          Accession #:    [id number] Date of Birth: [birth year]          Patient Gender: M Patient Age:   58 years Exam Location:  [facility] Procedure:      VAS [place] LOWER EXTREMITY VENOUS (DVT) Referring Phys: Loralie Champagne --------------------------------------------------------------------------------  Indications: Stroke.  Risk Factors: Lp(a) elevation, covid+. Anticoagulation: Xarelto. Comparison Study: Previous exam on 04/14/2017 was negative for DVT. Performing Technologist: Rogelia Rohrer RVT, RDMS  Examination Guidelines: A complete evaluation includes B-mode imaging, spectral Doppler, color Doppler, and power Doppler as needed of all accessible portions of each vessel. Bilateral testing is considered an integral part of a complete examination. Limited examinations for reoccurring indications may be performed as noted. The  reflux portion of the exam is performed with the patient in reverse Trendelenburg.  +---------+---------------+---------+-----------+----------+--------------+ RIGHT    CompressibilityPhasicitySpontaneityPropertiesThrombus Aging +---------+---------------+---------+-----------+----------+--------------+ CFV      Full           Yes      Yes                                 +---------+---------------+---------+-----------+----------+--------------+ SFJ      Full                                                         +---------+---------------+---------+-----------+----------+--------------+ FV Prox  Full           Yes      Yes                                 +---------+---------------+---------+-----------+----------+--------------+ FV Mid   Full           Yes      Yes                                 +---------+---------------+---------+-----------+----------+--------------+ FV DistalFull           Yes      Yes                                 +---------+---------------+---------+-----------+----------+--------------+ PFV      Full                                                        +---------+---------------+---------+-----------+----------+--------------+ POP      Full           Yes      Yes                                 +---------+---------------+---------+-----------+----------+--------------+ PTV      Full                                                        +---------+---------------+---------+-----------+----------+--------------+ PERO     Full                                                        +---------+---------------+---------+-----------+----------+--------------+   +---------+---------------+---------+-----------+----------+--------------+ LEFT     CompressibilityPhasicitySpontaneityPropertiesThrombus Aging +---------+---------------+---------+-----------+----------+--------------+ CFV      Full           Yes      Yes                                 +---------+---------------+---------+-----------+----------+--------------+  SFJ      Full                                                        +---------+---------------+---------+-----------+----------+--------------+ FV Prox  Full           Yes      Yes                                 +---------+---------------+---------+-----------+----------+--------------+ FV Mid   Full           Yes      Yes                                  +---------+---------------+---------+-----------+----------+--------------+ FV DistalFull           Yes      Yes                                 +---------+---------------+---------+-----------+----------+--------------+ PFV      Full                                                        +---------+---------------+---------+-----------+----------+--------------+ POP      Full           Yes      Yes                                 +---------+---------------+---------+-----------+----------+--------------+ PTV      Full                                                        +---------+---------------+---------+-----------+----------+--------------+ PERO     Full                                                        +---------+---------------+---------+-----------+----------+--------------+     Summary: BILATERAL: - No evidence of deep vein thrombosis seen in the lower extremities, bilaterally. - No evidence of superficial venous thrombosis in the lower extremities, bilaterally. -No evidence of popliteal cyst, bilaterally.   *See table(s) above for measurements and observations. Electronically signed by Monica Martinez MD on 03/22/2021 at 5:27:59 PM.    Final    CT ANGIO HEAD NECK W WO CM (CODE STROKE)  Result Date: 03/22/2021 CLINICAL DATA:  45 year old male with a history of abnormal distal right vertebral artery. Right PICA infarct in 2018. Code stroke presentation. EXAM: CT ANGIOGRAPHY HEAD AND NECK TECHNIQUE: Multidetector CT imaging of the head and neck was performed using the standard protocol during bolus administration of intravenous contrast. Multiplanar CT image reconstructions  and MIPs were obtained to evaluate the vascular anatomy. Carotid stenosis measurements (when applicable) are obtained utilizing NASCET criteria, using the distal internal carotid diameter as the denominator. CONTRAST:  100 mL Omnipaque 350 COMPARISON:  CTA head and neck 10/29/2019.  Plain head  CT today. FINDINGS: CTA NECK Skeleton: No acute osseous abnormality identified. Congenital incomplete ossification of the posterior C1 ring. New right maxillary, posterior ethmoid and sphenoid sinus mucosal thickening. Upper chest: Negative. Other neck: Within normal limits. Aortic arch: 3 vessel arch configuration.  No arch atherosclerosis. Right carotid system: Negative. Left carotid system: Negative. Vertebral arteries: Normal proximal right subclavian artery and right vertebral artery origin. Non dominant appearance of the right vertebral artery not significantly changed since 2021. The vessel remains patent to the skull base with no stenosis. Normal proximal left subclavian artery and left vertebral artery origin. Stable dominant appearance of the left vertebral artery in the V1 and V2 segments. However, new distal left vertebral artery dissection and high-grade stenosis beginning at the left V3 segment with subtotal thrombosed lumen at the C1 level. See series 7, images 170-161. Improved enhancement of the vessel at the skull base. Preliminary results of the above were communicated to Dr. Rory Percy at 5:47 am on 03/22/2021 by text page via the [facility] messaging system. CTA HEAD Posterior circulation: Despite the left V3 appearance the left vertebral V4 segment remains patent and appears fairly stable compared to 2021. Left PICA and left vertebrobasilar junction remain patent. Chronic diminutive distal right vertebral which terminates in PICA, chronic right V4 segment occlusion distal to the right PICA. Stable diminutive and mildly irregular basilar artery enhancement to the basilar tip. Bilateral SCA and PCA origins are stable and patent. Both posterior communicating arteries present as before. Bilateral PCA branches appear stable since 2021, with mild irregularity bilaterally. Anterior circulation: Both ICA siphons are patent and within normal limits. Normal posterior communicating and left ophthalmic artery  origins. Patent carotid termini. MCA and ACA origins remain normal. Normal anterior communicating artery. ACA branches are stable, the right A2 is dominant. Bilateral MCA M1 segments and bifurcations are patent. Bilateral MCA branches are stable since 2021. Venous sinuses: Early contrast timing. The superior sagittal sinus is patent. Anatomic variants: None. Review of the MIP images confirms the above findings IMPRESSION: 1. Acute Dissection Distal Left Vertebral Artery, in the V3 segment with associated high-grade stenosis. This left vertebral is the sole supply to the Basilar Artery, but the left V4 segment and remaining posterior circulation appear stable since a 2021 CTA. 2. Chronically occluded right vertebral V4 segment. Stable diminutive right vertebral artery terminating in PICA. 3. Bilateral carotid arteries appear stable and negative. 4. But mild generalized irregular appearance of the anterior and posterior circulation vessels is noted and stable since 2021. Study discussed by telephone with Dr. Amie Portland on 03/22/2021 at 05:52 . Electronically Signed   By: Genevie Ann M.D.   On: 03/22/2021 05:55     Medical Consultants:   None.   Subjective:    Jake Moore still with dizziness.  Objective:    Vitals:   03/23/21 0016 03/23/21 0200 03/23/21 0435 03/23/21 0805  BP: 107/89 125/90 110/76 118/85  Pulse: 85 82 78 67  Resp: 14  16 10   Temp: 98 F (36.7 C)  97.9 F (36.6 C) 98.1 F (36.7 C)  TempSrc: Oral  Oral Oral  SpO2: 95%  98% 95%  Weight:      Height:       SpO2: 95 %  Intake/Output Summary (Last 24 hours) at 03/23/2021 0818 Last data filed at 03/23/2021 0521 Gross per 24 hour  Intake 288.12 ml  Output --  Net 288.12 ml   Filed Weights   03/22/21 1932  Weight: 88.6 kg    Exam: General exam: In no acute distress. Respiratory system: Good air movement and clear to auscultation. Cardiovascular system: S1 & S2 heard, RRR. No JVD. Gastrointestinal system:  Abdomen is nondistended, soft and nontender.  Extremities: No pedal edema. Skin: No rashes, lesions or ulcers Psychiatry: Judgement and insight appear normal. Mood & affect appropriate.    Data Reviewed:    Labs: Basic Metabolic Panel: Recent Labs  Lab 03/22/21 0524 03/22/21 0533 03/23/21 0118  NA 140 141  --   K 3.4* 3.4*  --   CL 108 108  --   CO2 18*  --   --   GLUCOSE 113* 110*  --   BUN 12 12  --   CREATININE 0.97 0.80  --   CALCIUM 8.8*  --   --   MG  --   --  1.9  PHOS  --   --  3.6   GFR Estimated Creatinine Clearance: 133.4 mL/min (by C-G formula based on SCr of 0.8 mg/dL). Liver Function Tests: Recent Labs  Lab 03/22/21 0524  AST 49*  ALT 30  ALKPHOS 63  BILITOT 0.8  PROT 6.6  ALBUMIN 4.0   No results for input(s): LIPASE, AMYLASE in the last 168 hours. No results for input(s): AMMONIA in the last 168 hours. Coagulation profile Recent Labs  Lab 03/22/21 0524  INR 1.2   COVID-19 Labs  No results for input(s): DDIMER, FERRITIN, LDH, CRP in the last 72 hours.  Lab Results  Component Value Date   SARSCOV2NAA POSITIVE (A) 03/22/2021   [place] Not Detected 02/26/2019   [place] Not Detected 02/17/2019   [place] Not Detected 10/27/2018    CBC: Recent Labs  Lab 03/22/21 0524 03/22/21 0533  WBC 6.2  --   NEUTROABS 2.6  --   HGB 15.3 15.0  HCT 43.9 44.0  MCV 90.5  --   PLT 227  --    Cardiac Enzymes: No results for input(s): CKTOTAL, CKMB, CKMBINDEX, TROPONINI in the last 168 hours. BNP (last 3 results) No results for input(s): PROBNP in the last 8760 hours. CBG: Recent Labs  Lab 03/22/21 0649  GLUCAP 116*   D-Dimer: No results for input(s): DDIMER in the last 72 hours. Hgb A1c: Recent Labs    03/22/21 0833  HGBA1C 5.2   Lipid Profile: Recent Labs    03/22/21 0833  CHOL 133  HDL 65  LDLCALC 59  TRIG 43  CHOLHDL 2.0   Thyroid function studies: No results for input(s): TSH, T4TOTAL, T3FREE, THYROIDAB in the  last 72 hours.  Invalid input(s): FREET3 Anemia work up: No results for input(s): VITAMINB12, FOLATE, FERRITIN, TIBC, IRON, RETICCTPCT in the last 72 hours. Sepsis Labs: Recent Labs  Lab 03/22/21 0524  WBC 6.2   Microbiology Recent Results (from the past 240 hour(s))  Resp Panel by RT-PCR (Flu A&B, Covid) Nasopharyngeal Swab     Status: Abnormal   Collection Time: 03/22/21  5:18 AM   Specimen: Nasopharyngeal Swab; Nasopharyngeal(NP) swabs in vial transport medium  Result Value Ref Range Status   SARS Coronavirus 2 by RT PCR POSITIVE (A) NEGATIVE Final    Comment: RESULT CALLED TO, READ BACK BY AND VERIFIED WITH: RN K RAND [phone] MLM (NOTE) SARS-CoV-2 target nucleic  acids are DETECTED.  The SARS-CoV-2 RNA is generally detectable in upper respiratory specimens during the acute phase of infection. Positive results are indicative of the presence of the identified virus, but do not rule out bacterial infection or co-infection with other pathogens not detected by the test. Clinical correlation with patient history and other diagnostic information is necessary to determine patient infection status. The expected result is Negative.  Fact Sheet for Patients: BloggerCourse.com  Fact Sheet for Healthcare Providers: SeriousBroker.it  This test is not yet approved or cleared by the [place] FDA and  has been authorized for detection and/or diagnosis of SARS-CoV-2 by FDA under an Emergency Use Authorization (EUA).  This EUA will remain in effect (meaning this test can be used) f or the duration of  the COVID-19 declaration under Section 564(b)(1) of the Act, 21 U.S.C. section 360bbb-3(b)(1), unless the authorization is terminated or revoked sooner.     Influenza A by PCR NEGATIVE NEGATIVE Final   Influenza B by PCR NEGATIVE NEGATIVE Final    Comment: (NOTE) The Xpert Xpress SARS-CoV-2/FLU/RSV plus assay is intended as an  aid in the diagnosis of influenza from Nasopharyngeal swab specimens and should not be used as a sole basis for treatment. Nasal washings and aspirates are unacceptable for Xpert Xpress SARS-CoV-2/FLU/RSV testing.  Fact Sheet for Patients: BloggerCourse.com  Fact Sheet for Healthcare Providers: SeriousBroker.it  This test is not yet approved or cleared by the [place] FDA and has been authorized for detection and/or diagnosis of SARS-CoV-2 by FDA under an Emergency Use Authorization (EUA). This EUA will remain in effect (meaning this test can be used) for the duration of the COVID-19 declaration under Section 564(b)(1) of the Act, 21 U.S.C. section 360bbb-3(b)(1), unless the authorization is terminated or revoked.  Performed at [facility], [address], [place], [place] [address]      Medications:     stroke: mapping our early stages of recovery book   Does not apply Once   vitamin C  500 mg Oral Daily   aspirin EC  81 mg Oral Daily   FLUoxetine  10 mg Oral QPM   folic acid  1 mg Oral Daily   multivitamin with minerals  1 tablet Oral Daily   rosuvastatin  20 mg Oral Daily   thiamine  100 mg Oral Daily   Or   thiamine  100 mg Intravenous Daily   zinc sulfate  220 mg Oral Daily   Continuous Infusions:  heparin 1,300 Units/hr (03/23/21 0521)      LOS: 1 day   Marinda Elk  [facility]  03/23/2021, 8:18 AM

## 2021-03-23 NOTE — Evaluation (Signed)
Occupational Therapy Evaluation Patient Details Name: Jake Moore MRN: [id number] DOB: May 09, 1975 Today's Date: 03/23/2021   History of Present Illness 45 yo male admitted 12/1 reporting new R facial and L side tingling and hypersensitivity, has residual balance issues per pt from previous stroke.  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  L leg resolving sensory changes and pt feels he is at baseline for mobility.  PMHx:  R lateral medulla encephalomalcia, R vertebral artery dissection, stroke,  HLD, lipoprotein metabolic disorder, recent Covid 19,  EtOH use, transaminitis with elevated AST, anxiety,   Clinical Impression   PTA patient independent and working. Admitted for above and presenting near baseline independent level for ADLs, mobility and transfers in room. He demonstrates ability to complete dual cognitive tasks throughout session without difficulty.  Reports L UE tingling from prior CVA but no new deficits noted in sensation, coordination or vision.  Based on performance today, no further OT needs have been identified and OT will sign off (pt agreeable with recommendations). OT will sign off.      Recommendations for follow up therapy are one component of a multi-disciplinary discharge planning process, led by the attending physician.  Recommendations may be updated based on patient status, additional functional criteria and insurance authorization.   Follow Up Recommendations  No OT follow up    Assistance Recommended at Discharge PRN  Functional Status Assessment     Equipment Recommendations  None recommended by OT    Recommendations for Other Services       Precautions / Restrictions Precautions Precautions: Fall Restrictions Weight Bearing Restrictions: No      Mobility Bed Mobility Overal bed mobility: Independent                   Transfers Overall transfer level: Independent                        Balance Overall balance assessment: Mild deficits observed, not formally tested                                         ADL either performed or assessed with clinical judgement   ADL Overall ADL's : Independent                                             Vision Baseline Vision/History: 1 Wears glasses Ability to See in Adequate Light: 0 Adequate Patient Visual Report: No change from baseline Vision Assessment?: Yes Eye Alignment: Within Functional Limits Ocular Range of Motion: Within Functional Limits Alignment/Gaze Preference: Within Defined Limits Tracking/Visual Pursuits: Able to track stimulus in all quads without difficulty Saccades: Within functional limits Convergence: Within functional limits Visual Fields: No apparent deficits Additional Comments: wears reading glasses     Perception     Praxis      Pertinent Vitals/Pain Pain Assessment: No/denies pain     Hand Dominance Right   Extremity/Trunk Assessment Upper Extremity Assessment Upper Extremity Assessment: Overall WFL for tasks assessed;LUE deficits/detail LUE Deficits / Details: reports baseline L sided tingling from prior CVA   Lower Extremity Assessment Lower Extremity Assessment: Defer  to PT evaluation RLE Deficits / Details: mild strength changes on RLE LLE Deficits / Details: coordination changes on LLE centered at hip LLE Coordination: decreased gross motor   Cervical / Trunk Assessment Cervical / Trunk Assessment: Normal   Communication Communication Communication: No difficulties   Cognition Arousal/Alertness: Awake/alert Behavior During Therapy: WFL for tasks assessed/performed Overall Cognitive Status: Within Functional Limits for tasks assessed                                 General Comments: great awareness, demonstrates ability  to compelte dual cog tasks without difficutly     General Comments  VSS    Exercises     Shoulder Instructions      Home Living Family/patient expects to be discharged to:: Private residence Living Arrangements: Spouse/significant other;Children Available Help at Discharge: Family;Available 24 hours/day Type of Home: House Home Access: Stairs to enter [employer] of Steps: 5 Entrance Stairs-Rails:  (middle rail) Home Layout: Two level;Able to live on main level with bedroom/bathroom     Bathroom Shower/Tub: Producer, television/film/video: Standard     Home Equipment: Grab bars - tub/shower          Prior Functioning/Environment Prior Level of Function : Independent/Modified Independent;Working/employed             Mobility Comments: 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, Scientist, research (physical sciences) ADLs Comments: 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 Problem List:        OT Treatment/Interventions:      OT Goals(Current goals can be found in the care plan section) Acute Rehab OT Goals Patient Stated Goal: get back home OT Goal Formulation: With patient Time For Goal Achievement: 04/06/21 Potential to Achieve Goals: Good  OT Frequency:     Barriers to D/C:            Co-evaluation              AM-PAC OT "6 Clicks" Daily Activity     Outcome Measure Help from another person eating meals?: None Help from another person taking care of personal grooming?: None Help from another person toileting, which includes using toliet, bedpan, or urinal?: None Help from another person bathing (including washing, rinsing, drying)?: None Help from another person to put on and taking off regular upper body clothing?: None Help from another person to put on and taking off regular lower body clothing?: None 6 Click Score: 24   End of Session Nurse Communication: Mobility status  Activity Tolerance: Patient tolerated treatment  well Patient left: in bed;with call bell/phone within reach;with bed alarm set  OT Visit Diagnosis: Other abnormalities of gait and mobility (R26.89)                Time: 1209-1229 OT Time Calculation (min): 20 min Charges:  OT General Charges $OT Visit: 1 Visit OT Evaluation $OT Eval Moderate Complexity: 1 Mod  Barry Brunner, OT Acute Rehabilitation Services Pager [phone] Office [phone]   Jake Moore 03/23/2021, 2:07 PM

## 2021-03-23 NOTE — Progress Notes (Signed)
STROKE TEAM PROGRESS NOTE   SUBJECTIVE (INTERVAL HISTORY) His father is at the bedside.  Patient doing well, no complaints overnight, neuro intact.  PT/OT recommend outpatient PT/OT.  Heparin IV has switched to Xarelto.  Continue aspirin and statin.  Okay to DC from neuro standpoint and follow-up with Dr. Pearlean Brownie at [facility] in 4 weeks.   OBJECTIVE Temp:  [97.9 F (36.6 C)-98.6 F (37 C)] 98 F (36.7 C) (12/02 1648) Pulse Rate:  [67-89] 73 (12/02 1648) Cardiac Rhythm: Normal sinus rhythm (12/02 0818) Resp:  [10-18] 15 (12/02 1648) BP: (107-127)/(76-96) 119/83 (12/02 1648) SpO2:  [94 %-99 %] 96 % (12/02 1648) Weight:  [88.6 kg] 88.6 kg (12/01 1932)  Recent Labs  Lab 03/22/21 0649  GLUCAP 116*   Recent Labs  Lab 03/22/21 0524 03/22/21 0533 03/23/21 0118 03/23/21 1023  NA 140 141  --  137  K 3.4* 3.4*  --  4.1  CL 108 108  --  109  CO2 18*  --   --  22  GLUCOSE 113* 110*  --  85  BUN 12 12  --  9  CREATININE 0.97 0.80  --  0.82  CALCIUM 8.8*  --   --  8.8*  MG  --   --  1.9  --   PHOS  --   --  3.6  --    Recent Labs  Lab 03/22/21 0524  AST 49*  ALT 30  ALKPHOS 63  BILITOT 0.8  PROT 6.6  ALBUMIN 4.0   Recent Labs  Lab 03/22/21 0524 03/22/21 0533  WBC 6.2  --   NEUTROABS 2.6  --   HGB 15.3 15.0  HCT 43.9 44.0  MCV 90.5  --   PLT 227  --    No results for input(s): CKTOTAL, CKMB, CKMBINDEX, TROPONINI in the last 168 hours. Recent Labs    03/22/21 0524  LABPROT 15.6*  INR 1.2   Recent Labs    03/22/21 0910  COLORURINE YELLOW  LABSPEC 1.010  PHURINE 6.5  GLUCOSEU NEGATIVE  HGBUR NEGATIVE  BILIRUBINUR NEGATIVE  KETONESUR 15*  PROTEINUR NEGATIVE  NITRITE NEGATIVE  LEUKOCYTESUR NEGATIVE       Component Value Date/Time   CHOL 133 03/22/2021 0833   CHOL 117 05/08/2018 0815   TRIG 43 03/22/2021 0833   HDL 65 03/22/2021 0833   HDL 63 05/08/2018 0815   CHOLHDL 2.0 03/22/2021 0833   VLDL 9 03/22/2021 0833   LDLCALC 59 03/22/2021 0833   LDLCALC 40  05/08/2018 0815   Lab Results  Component Value Date   HGBA1C 5.2 03/22/2021      Component Value Date/Time   LABOPIA NONE DETECTED 03/22/2021 0910   COCAINSCRNUR NONE DETECTED 03/22/2021 0910   LABBENZ NONE DETECTED 03/22/2021 0910   AMPHETMU NONE DETECTED 03/22/2021 0910   THCU NONE DETECTED 03/22/2021 0910   LABBARB NONE DETECTED 03/22/2021 0910    Recent Labs  Lab 03/22/21 0524  ETH 20*    I have personally reviewed the radiological images below and agree with the radiology interpretations.  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 Code stroke presentation, with acute distal left vertebral artery dissection on CTA. EXAM: MRI HEAD WITHOUT CONTRAST TECHNIQUE: Multiplanar, multiecho pulse sequences of the brain and surrounding structures were obtained without intravenous contrast. COMPARISON:  CTA head and neck today.  Brain MRI 05/29/2017. FINDINGS: Brain: There  is patchy mildly to moderately restricted diffusion in the left superior cerebellum SCA territory (series 5, image 74) with additional posterior central cerebellar hemisphere restriction (series 5, image 68). No medulla or brainstem involvement identified. However, there is a subtle area of contralateral right SCA involvement (series 5, image 72). No thalamic or other posterior circulation restricted diffusion. And only subtle FLAIR hyperintensity in the areas of abnormal cerebellar diffusion. No associated hemorrhage. A small chronic area of cystic encephalomalacia in the right lateral medulla has decreased since 2019 and is subtle now (series 15, image 6). Supratentorial gray and white matter signal remains normal. No midline shift, mass effect, evidence of mass lesion, ventriculomegaly, extra-axial collection or acute intracranial hemorrhage. Cervicomedullary junction and pituitary are within normal limits.  Vascular: Major intracranial vascular flow voids are stable despite the abnormal left vertebral V3 segment which is partially visible on series 15, image 3 of this exam. Skull and upper cervical spine: Negative. Visualized bone marrow signal is within normal limits. Sinuses/Orbits: Negative orbits. Mild to moderate paranasal sinus mucosal thickening as on the earlier CT. Other: Mastoids are clear. Visible internal auditory structures appear normal. IMPRESSION: 1. Patchy acute infarcts in the Cerebellum. Left SCA territory primarily affected, but lesser involvement in the left posterior cerebellum and right SCA territory. No associated hemorrhage or mass effect. 2. No other acute intracranial abnormality. Subtle chronic encephalomalacia in the right lateral medulla. Electronically Signed   By: Odessa Fleming M.D.   On: 03/22/2021 06:24   DG CHEST PORT 1 VIEW  Result Date: 03/22/2021 CLINICAL DATA:  COVID-19 EXAM: PORTABLE CHEST 1 VIEW COMPARISON:  Chest x-ray dated July 14, 2016 FINDINGS: The heart size and mediastinal contours are within normal limits. Both lungs are clear. The visualized skeletal structures are unremarkable. IMPRESSION: No active disease. Electronically Signed   By: Allegra Lai M.D.   On: 03/22/2021 08:05   ECHOCARDIOGRAM COMPLETE  Result Date: 03/22/2021    ECHOCARDIOGRAM REPORT   Patient Name:   Jake Moore [facility] Date of Exam: 03/22/2021 Medical Rec #:  [id number]         Height:       70.0 in Accession #:    [id number]        Weight:       196.2 lb Date of Birth:  March 21, 1976         BSA:          2.070 m Patient Age:    45 years          BP:           125/90 mmHg Patient Gender: M                 HR:           71 bpm. Exam Location:  Inpatient Procedure: 2D Echo, Color Doppler and Cardiac Doppler Indications:    CVA  History:        Patient has prior history of Echocardiogram examinations, most                 recent 04/12/2017. Risk Factors:Dyslipidemia.  Sonographer:    Gertie Fey  MHA, RDMS, RVT, RDCS Referring Phys: [id number] [facility] A SMITH  Sonographer Comments: Image acquisition challenging due to respiratory motion. IMPRESSIONS  1. Left ventricular ejection fraction, by estimation, is 60 to 65%. The left ventricle has normal function. The left ventricle has no regional wall motion abnormalities. Left ventricular diastolic parameters were normal.  2. Right ventricular systolic function  is normal. The right ventricular size is normal.  3. The mitral valve is normal in structure. No evidence of mitral valve regurgitation. No evidence of mitral stenosis.  4. The aortic valve is normal in structure. Aortic valve regurgitation is trivial. No aortic stenosis is present. Comparison(s): Prior images unable to be directly viewed, comparison made by report only. Conclusion(s)/Recommendation(s): No intracardiac source of embolism detected on this transthoracic study. Consider a transesophageal echocardiogram to exclude cardiac source of embolism if clinically indicated. FINDINGS  Left Ventricle: Left ventricular ejection fraction, by estimation, is 60 to 65%. The left ventricle has normal function. The left ventricle has no regional wall motion abnormalities. The left ventricular internal cavity size was normal in size. There is  no left ventricular hypertrophy. Left ventricular diastolic parameters were normal. Right Ventricle: The right ventricular size is normal. Right ventricular systolic function is normal. Left Atrium: Left atrial size was normal in size. Right Atrium: Right atrial size was normal in size. Pericardium: There is no evidence of pericardial effusion. Mitral Valve: The mitral valve is normal in structure. No evidence of mitral valve regurgitation. No evidence of mitral valve stenosis. Tricuspid Valve: The tricuspid valve is normal in structure. Tricuspid valve regurgitation is trivial. No evidence of tricuspid stenosis. Aortic Valve: The aortic valve is normal in structure. Aortic  valve regurgitation is trivial. No aortic stenosis is present. Aortic valve mean gradient measures 2.0 mmHg. Aortic valve peak gradient measures 3.7 mmHg. Aortic valve area, by VTI measures 3.21 cm. Pulmonic Valve: The pulmonic valve was normal in structure. Pulmonic valve regurgitation is not visualized. No evidence of pulmonic stenosis. Aorta: The aortic root is normal in size and structure. Venous: The inferior vena cava was not well visualized. IAS/Shunts: No atrial level shunt detected by color flow Doppler.  LEFT VENTRICLE PLAX 2D LVIDd:         4.80 cm   Diastology LVIDs:         3.50 cm   LV e' medial:    9.57 cm/s LV PW:         0.60 cm   LV E/e' medial:  10.2 LV IVS:        0.40 cm   LV e' lateral:   9.36 cm/s LVOT diam:     2.40 cm   LV E/e' lateral: 10.5 LV SV:         61 LV SV Index:   29 LVOT Area:     4.52 cm  RIGHT VENTRICLE RV S prime:     7.72 cm/s TAPSE (M-mode): 2.5 cm LEFT ATRIUM             Index        RIGHT ATRIUM          Index LA diam:        2.40 cm 1.16 cm/m   RA Area:     7.77 cm LA Vol (A2C):   22.9 ml 11.06 ml/m  RA Volume:   10.60 ml 5.12 ml/m LA Vol (A4C):   28.7 ml 13.86 ml/m LA Biplane Vol: 26.4 ml 12.75 ml/m  AORTIC VALVE AV Area (Vmax):    3.59 cm AV Area (Vmean):   3.61 cm AV Area (VTI):     3.21 cm AV Vmax:           96.50 cm/s AV Vmean:          72.900 cm/s AV VTI:            0.189  m AV Peak Grad:      3.7 mmHg AV Mean Grad:      2.0 mmHg LVOT Vmax:         76.50 cm/s LVOT Vmean:        58.200 cm/s LVOT VTI:          0.134 m LVOT/AV VTI ratio: 0.71  AORTA Ao Root diam: 3.50 cm MITRAL VALVE MV Area (PHT): 2.57 cm    SHUNTS MV Decel Time: 295 msec    Systemic VTI:  0.13 m MV E velocity: 97.90 cm/s  Systemic Diam: 2.40 cm MV A velocity: 74.70 cm/s MV E/A ratio:  1.31 Olga Millers MD Electronically signed by Olga Millers MD Signature Date/Time: 03/22/2021/3:06:20 PM    Final    CT HEAD CODE STROKE WO CONTRAST  Result Date: 03/22/2021 CLINICAL DATA:  Code stroke.  45 year old male with blurred vision and right side deficits. EXAM: CT HEAD WITHOUT CONTRAST TECHNIQUE: Contiguous axial images were obtained from the base of the skull through the vertex without intravenous contrast. COMPARISON:  Brain MRI 05/29/2017.  Head CT 10/29/2019. FINDINGS: Brain: Stable cerebral volume since 2021, stable and normal cerebral volume. No midline shift, ventriculomegaly, mass effect, evidence of mass lesion, intracranial hemorrhage or evidence of cortically based acute infarction. Gray-white matter differentiation is within normal limits throughout the brain, with subtle encephalomalacia of the dorsal right medulla better demonstrated by MRI. Vascular: No suspicious intracranial vascular hyperdensity. Skull: Congenital incomplete ossification of the posterior C1 ring. No acute osseous abnormality identified. Sinuses/Orbits: Mild to moderate scattered new posterior ethmoid and sphenoid sinus mucosal thickening. Tympanic cavities and mastoids remain clear. Other: Visualized orbits and scalp soft tissues are within normal limits. ASPECTS [facility] Stroke Program Early CT Score) Total score (0-10 with 10 being normal): 10 IMPRESSION: 1. Stable since 2021 and virtually normal noncontrast CT appearance of the brain. ASPECTS 10. 2. Mild to moderate new ethmoid and sphenoid paranasal sinus disease. 3. These results were communicated to Dr. Wilford Corner at 5:39 am on 03/22/2021 by text page via the [facility] messaging system. Electronically Signed   By: Odessa Fleming M.D.   On: 03/22/2021 05:39   VAS [place] LOWER EXTREMITY VENOUS (DVT)  Result Date: 03/22/2021  Lower Venous DVT Study Patient Name:  CHISUM HABENICHT [facility]  Date of Exam:   03/22/2021 Medical Rec #: [id number]          Accession #:    [id number] Date of Birth: 1976/03/15          Patient Gender: M Patient Age:   36 years Exam Location:  [facility] Procedure:      VAS [place] LOWER EXTREMITY VENOUS (DVT) Referring Phys: Marca Ancona  --------------------------------------------------------------------------------  Indications: Stroke.  Risk Factors: Lp(a) elevation, covid+. Anticoagulation: Xarelto. Comparison Study: Previous exam on 04/14/2017 was negative for DVT. Performing Technologist: Ernestene Mention RVT, RDMS  Examination Guidelines: A complete evaluation includes B-mode imaging, spectral Doppler, color Doppler, and power Doppler as needed of all accessible portions of each vessel. Bilateral testing is considered an integral part of a complete examination. Limited examinations for reoccurring indications may be performed as noted. The reflux portion of the exam is performed with the patient in reverse Trendelenburg.  +---------+---------------+---------+-----------+----------+--------------+ RIGHT    CompressibilityPhasicitySpontaneityPropertiesThrombus Aging +---------+---------------+---------+-----------+----------+--------------+ CFV      Full           Yes      Yes                                 +---------+---------------+---------+-----------+----------+--------------+  SFJ      Full                                                        +---------+---------------+---------+-----------+----------+--------------+ FV Prox  Full           Yes      Yes                                 +---------+---------------+---------+-----------+----------+--------------+ FV Mid   Full           Yes      Yes                                 +---------+---------------+---------+-----------+----------+--------------+ FV DistalFull           Yes      Yes                                 +---------+---------------+---------+-----------+----------+--------------+ PFV      Full                                                        +---------+---------------+---------+-----------+----------+--------------+ POP      Full           Yes      Yes                                  +---------+---------------+---------+-----------+----------+--------------+ PTV      Full                                                        +---------+---------------+---------+-----------+----------+--------------+ PERO     Full                                                        +---------+---------------+---------+-----------+----------+--------------+   +---------+---------------+---------+-----------+----------+--------------+ LEFT     CompressibilityPhasicitySpontaneityPropertiesThrombus Aging +---------+---------------+---------+-----------+----------+--------------+ CFV      Full           Yes      Yes                                 +---------+---------------+---------+-----------+----------+--------------+ SFJ      Full                                                        +---------+---------------+---------+-----------+----------+--------------+  FV Prox  Full           Yes      Yes                                 +---------+---------------+---------+-----------+----------+--------------+ FV Mid   Full           Yes      Yes                                 +---------+---------------+---------+-----------+----------+--------------+ FV DistalFull           Yes      Yes                                 +---------+---------------+---------+-----------+----------+--------------+ PFV      Full                                                        +---------+---------------+---------+-----------+----------+--------------+ POP      Full           Yes      Yes                                 +---------+---------------+---------+-----------+----------+--------------+ PTV      Full                                                        +---------+---------------+---------+-----------+----------+--------------+ PERO     Full                                                         +---------+---------------+---------+-----------+----------+--------------+     Summary: BILATERAL: - No evidence of deep vein thrombosis seen in the lower extremities, bilaterally. - No evidence of superficial venous thrombosis in the lower extremities, bilaterally. -No evidence of popliteal cyst, bilaterally.   *See table(s) above for measurements and observations. Electronically signed by Sherald Hess MD on 03/22/2021 at 5:27:59 PM.    Final    CT ANGIO HEAD NECK W WO CM (CODE STROKE)  Result Date: 03/22/2021 CLINICAL DATA:  45 year old male with a history of abnormal distal right vertebral artery. Right PICA infarct in 2018. Code stroke presentation. EXAM: CT ANGIOGRAPHY HEAD AND NECK TECHNIQUE: Multidetector CT imaging of the head and neck was performed using the standard protocol during bolus administration of intravenous contrast. Multiplanar CT image reconstructions and MIPs were obtained to evaluate the vascular anatomy. Carotid stenosis measurements (when applicable) are obtained utilizing NASCET criteria, using the distal internal carotid diameter as the denominator. CONTRAST:  100 mL Omnipaque 350 COMPARISON:  CTA head and neck 10/29/2019.  Plain head CT today. FINDINGS: CTA NECK Skeleton: No acute osseous abnormality identified. Congenital incomplete ossification of the posterior C1 ring.  New right maxillary, posterior ethmoid and sphenoid sinus mucosal thickening. Upper chest: Negative. Other neck: Within normal limits. Aortic arch: 3 vessel arch configuration.  No arch atherosclerosis. Right carotid system: Negative. Left carotid system: Negative. Vertebral arteries: Normal proximal right subclavian artery and right vertebral artery origin. Non dominant appearance of the right vertebral artery not significantly changed since 2021. The vessel remains patent to the skull base with no stenosis. Normal proximal left subclavian artery and left vertebral artery origin. Stable dominant appearance of  the left vertebral artery in the V1 and V2 segments. However, new distal left vertebral artery dissection and high-grade stenosis beginning at the left V3 segment with subtotal thrombosed lumen at the C1 level. See series 7, images 170-161. Improved enhancement of the vessel at the skull base. Preliminary results of the above were communicated to Dr. Wilford Corner at 5:47 am on 03/22/2021 by text page via the [facility] messaging system. CTA HEAD Posterior circulation: Despite the left V3 appearance the left vertebral V4 segment remains patent and appears fairly stable compared to 2021. Left PICA and left vertebrobasilar junction remain patent. Chronic diminutive distal right vertebral which terminates in PICA, chronic right V4 segment occlusion distal to the right PICA. Stable diminutive and mildly irregular basilar artery enhancement to the basilar tip. Bilateral SCA and PCA origins are stable and patent. Both posterior communicating arteries present as before. Bilateral PCA branches appear stable since 2021, with mild irregularity bilaterally. Anterior circulation: Both ICA siphons are patent and within normal limits. Normal posterior communicating and left ophthalmic artery origins. Patent carotid termini. MCA and ACA origins remain normal. Normal anterior communicating artery. ACA branches are stable, the right A2 is dominant. Bilateral MCA M1 segments and bifurcations are patent. Bilateral MCA branches are stable since 2021. Venous sinuses: Early contrast timing. The superior sagittal sinus is patent. Anatomic variants: None. Review of the MIP images confirms the above findings IMPRESSION: 1. Acute Dissection Distal Left Vertebral Artery, in the V3 segment with associated high-grade stenosis. This left vertebral is the sole supply to the Basilar Artery, but the left V4 segment and remaining posterior circulation appear stable since a 2021 CTA. 2. Chronically occluded right vertebral V4 segment. Stable diminutive right  vertebral artery terminating in PICA. 3. Bilateral carotid arteries appear stable and negative. 4. But mild generalized irregular appearance of the anterior and posterior circulation vessels is noted and stable since 2021. Study discussed by telephone with Dr. Milon Dikes on 03/22/2021 at 05:52 . Electronically Signed   By: Odessa Fleming M.D.   On: 03/22/2021 05:55     PHYSICAL EXAM  Temp:  [97.9 F (36.6 C)-98.6 F (37 C)] 98 F (36.7 C) (12/02 1648) Pulse Rate:  [67-89] 73 (12/02 1648) Resp:  [10-18] 15 (12/02 1648) BP: (107-127)/(76-96) 119/83 (12/02 1648) SpO2:  [94 %-99 %] 96 % (12/02 1648) Weight:  [88.6 kg] 88.6 kg (12/01 1932)  General - Well nourished, well developed, in no apparent distress.  Ophthalmologic - fundi not visualized due to noncooperation.  Cardiovascular - Regular rhythm and rate.  Mental Status -  Level of arousal and orientation to time, place, and person were intact. Language including expression, naming, repetition, comprehension was assessed and found intact. Fund of Knowledge was assessed and was intact.  Cranial Nerves II - XII - II - Visual field intact OU. III, IV, VI - Extraocular movements intact. V - Facial sensation intact bilaterally. VII - Facial movement intact bilaterally. VIII - Hearing & vestibular intact bilaterally. X - Palate  elevates symmetrically. XI - Chin turning & shoulder shrug intact bilaterally. XII - Tongue protrusion intact.  Motor Strength - The patient's strength was normal in all extremities and pronator drift was absent.  Bulk was normal and fasciculations were absent.   Motor Tone - Muscle tone was assessed at the neck and appendages and was normal.  Reflexes - The patient's reflexes were symmetrical in all extremities and he had no pathological reflexes.  Sensory - Light touch, temperature/pinprick were assessed and were symmetrical.    Coordination - The patient had normal movements in the hands and feet with no  ataxia or dysmetria.  Tremor was absent.  Gait and Station - deferred.   ASSESSMENT/PLAN [name] is a 45 y.o. 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 No tPA given due to on Xarelto.    Stroke:  left PICA and b/l SCA (L>R) infarcts embolic secondary to left VA dissection, possibly combination of severe cough from COVID and recent aggressive work out CT no acute abnormality CTA head and neck left V3 dissection with stenosis, right V4 chronic occlusion vs. 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 Right V3 remains patent. MRI left PICA infarct, bilateral SCA infarcts left more than right 2D Echo EF 60 to 65% LE venous Doppler no DVT Do not think loop recorder or PFO closure needed at this time LDL 59 HgbA1c 5.2 Heparin IV for VTE prophylaxis Xarelto (rivaroxaban) daily prior to admission, now on Xarelto.  Continue ASA 81 and Xarelto on discharge.  And will repeat CTA in 2 to 3 months, if left VA recannulizes, aspirin can be discontinued. Patient counseled to be compliant with his antithrombotic medications Ongoing aggressive stroke risk factor management Therapy recommendations: Outpatient PT OT Disposition: Home today  History of stroke 03/2017 admitted for dizziness, off balance, headache, right facial numbness.  MRI showed right PICA infarct including cerebellum and lateral medullar.  MRA showed right V3/V4 occlusion, CTA neck redemonstrated right V3 occlusion.  EF normal range, A1c 5.2, LDL 57.  TEE showed small PFO, DVT negative.  Was put on heparin IV initially but then discharged on DAPT for 3 months and then Crestor 20. 05/2017 admitted for recurrent symptoms with dizziness and left facial numbness, MRI negative for acute stroke.  CTA showed partial recannulized right V3, ends up at PICA. Considered possible BPPV at the time Started initially Eliquis and then  Xarelto for possible thrombophilia in the setting of stroke and high Lp(a) by his cardiologist in [place]. Also followed up with Dr. Pearlean Brownie at [facility] and Dr. Jacinto Halim cardiology over the years  Hyperlipidemia High Lp(a) Home meds: Repatha and Crestor 20 LDL 59, goal < 70 On clinical trials for high Lp(a) with [facility] Now on Crestor 20 Continue statin at discharge, continue Repatha at home  Other Stroke Risk Factors Family hx stroke (sister had MI without any known medical condition, mother and grandpa died of stroke and MI) Small PFO - negative for DVT this time COVID one week ago with severe cough Aggressive physical work up prior to current and previous stroke  Other Active Problems   Hospital day # 1  Neurology will sign off. Please call with questions. Pt will follow up with stroke clinic Dr. Pearlean Brownie at [facility] in about 4 weeks. Thanks for the consult.   Marvel Plan, MD PhD Stroke Neurology 03/23/2021 5:51 PM    To contact Stroke Continuity provider,  please refer to [email or web address] After hours, contact General Neurology

## 2021-03-23 NOTE — Evaluation (Signed)
Physical Therapy Evaluation Patient Details Name: Jake Moore MRN: [id number] DOB: 01/09/1976 Today's Date: 03/23/2021  History of Present Illness  45 yo male admitted 12/1 reporting new R facial and L side tingling and hypersensitivity, has residual balance issues per pt from previous stroke.  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  L leg resolving sensory changes and pt feels he is at baseline for mobility.  PMHx:  R lateral medulla encephalomalcia, R vertebral artery dissection, stroke,  HLD, lipoprotein metabolic disorder, recent Covid 19,  EtOH use, transaminitis with elevated AST, anxiety,  Clinical Impression  Pt was seen for initial mobility check and noted some deficits on L side with balance on Berg assessment, hypersensitive on L LE and has gait changes that he compensates with his base of support.  Pt is open to going to outpatient therapy for his deficits, some of which he attributes to the first stroke.  Follow up with him to get a handle on his control of balance, to work on high level challenges and to work toward being on the hall as his Covid permits.  Pt is expecting to go home and will have family support, which was validated by his father at the time of the eval.   Focus with pt on progression to outpatient therapy, which will allow him to progress faster and recover deficits that were not addressed from last stroke.  Has good outlook for recovery, very positive.       Recommendations for follow up therapy are one component of a multi-disciplinary discharge planning process, led by the attending physician.  Recommendations may be updated based on patient status, additional functional criteria and insurance authorization.  Follow Up Recommendations Outpatient PT    Assistance Recommended at Discharge Intermittent  Supervision/Assistance  Functional Status Assessment Patient has had a recent decline in their functional status and demonstrates the ability to make significant improvements in function in a reasonable and predictable amount of time.  Equipment Recommendations  None recommended by PT    Recommendations for Other Services       Precautions / Restrictions Precautions Precautions: Fall Precaution Comments: unsteady on LLE esp with higher level balance challenges Restrictions Weight Bearing Restrictions: No      Mobility  Bed Mobility Overal bed mobility: Independent                  Transfers Overall transfer level: Independent                      Ambulation/Gait Ambulation/Gait assistance: Supervision Gait Distance (Feet): 60 Feet Assistive device: None Gait Pattern/deviations: Wide base of support;Decreased stride length Gait velocity: variable Gait velocity interpretation: <1.31 ft/sec, indicative of household ambulator Pre-gait activities: standing balance check General Gait Details: pt is using a mildly widened base to maneuver with tendency to touch the furniture with greater speeds  Stairs            Wheelchair Mobility    Modified Rankin (Stroke Patients Only)       Balance Overall balance assessment: Needs assistance Sitting-balance support: Feet supported Sitting balance-Leahy Scale: Good     Standing balance support: No upper extremity supported Standing balance-Leahy Scale: Fair                   Standardized Balance Assessment Standardized Balance Assessment : Sharlene Motts  Balance Test Berg Balance Test Sit to Stand: Able to stand without using hands and stabilize independently Standing Unsupported: Able to stand safely 2 minutes Sitting with Back Unsupported but Feet Supported on Floor or Stool: Able to sit safely and securely 2 minutes Stand to Sit: Sits safely with minimal use of hands Transfers: Able to transfer safely,  minor use of hands Standing Unsupported with Eyes Closed: Able to stand 10 seconds with supervision Standing Ubsupported with Feet Together: Able to place feet together independently and stand 1 minute safely From Standing Position, Pick up Object from Floor: Able to pick up shoe safely and easily From Standing Position, Turn to Look Behind Over each Shoulder: Looks behind from both sides and weight shifts well Turn 360 Degrees: Able to turn 360 degrees safely one side only in 4 seconds or less Standing Unsupported, One Foot in Front: Loses balance while stepping or standing Standing on One Leg: Unable to try or needs assist to prevent fall         Pertinent Vitals/Pain Pain Assessment: No/denies pain    Home Living Family/patient expects to be discharged to:: Private residence Living Arrangements: Spouse/significant other Available Help at Discharge: Family;Available 24 hours/day Type of Home: House Home Access: Stairs to enter Entrance Stairs-Rails:  (rail in the middle) Entrance Stairs-Number of Steps: 5   Home Layout: Two level;Able to live on main level with bedroom/bathroom Home Equipment: Grab bars - tub/shower      Prior Function Prior Level of Function : Independent/Modified Independent;Working/employed             Mobility Comments: 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, Scientist, 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    Extremity/Trunk Assessment   Upper Extremity Assessment Upper Extremity Assessment: Defer to OT evaluation LUE Deficits / Details: noted coordination changes with gross movements on LUE    Lower Extremity Assessment Lower Extremity Assessment: LLE deficits/detail;RLE deficits/detail RLE Deficits / Details: mild strength changes on RLE RLE Sensation: WNL RLE Coordination: WNL LLE Deficits / Details: coordination changes on LLE centered at hip LLE  Sensation:  (hypersensitivity) LLE Coordination: decreased gross motor    Cervical / Trunk Assessment Cervical / Trunk Assessment: Normal  Communication   Communication: No difficulties  Cognition Arousal/Alertness: Awake/alert Behavior During Therapy: WFL for tasks assessed/performed Overall Cognitive Status: Within Functional Limits for tasks assessed                                 General Comments: answers questions appropriately, knows history of previous stroke        General Comments General comments (skin integrity, edema, etc.): VSS    Exercises     Assessment/Plan    PT Assessment Patient needs continued PT services  PT Problem List Decreased strength;Decreased activity tolerance;Decreased balance;Decreased coordination       PT Treatment Interventions DME instruction;Gait training;Stair training;Functional mobility training;Therapeutic activities;Therapeutic exercise;Balance training;Neuromuscular re-education;Patient/family education    PT Goals (Current goals can be found in the Care Plan section)  Acute Rehab PT Goals Patient Stated Goal: to get back to work and to improve balance PT Goal Formulation: With patient Time For Goal Achievement: 04/06/21 Potential to Achieve Goals: Good    Frequency Min 4X/week   Barriers to discharge   has family support at home, father and  his spouse    Co-evaluation               AM-PAC PT "6 Clicks" Mobility  Outcome Measure Help needed turning from your back to your side while in a flat bed without using bedrails?: None Help needed moving from lying on your back to sitting on the side of a flat bed without using bedrails?: None Help needed moving to and from a bed to a chair (including a wheelchair)?: A Little Help needed standing up from a chair using your arms (e.g., wheelchair or bedside chair)?: A Little Help needed to walk in hospital room?: A Little Help needed climbing 3-5 steps with a  railing? : A Lot 6 Click Score: 19    End of Session Equipment Utilized During Treatment: Gait belt Activity Tolerance: Treatment limited secondary to medical complications (Comment) Patient left: in bed;with call bell/phone within reach;with bed alarm set Nurse Communication: Mobility status;Other (comment) (changes of balance and sensory changes) PT Visit Diagnosis: Unsteadiness on feet (R26.81);Hemiplegia and hemiparesis Hemiplegia - Right/Left:  (effects on both sides) Hemiplegia - caused by: Cerebral infarction    Time: 1121-1150 PT Time Calculation (min) (ACUTE ONLY): 29 min   Charges:   PT Evaluation $PT Eval Moderate Complexity: 1 Mod PT Treatments $Gait Training: 8-22 mins       Ivar Drape 03/23/2021, 4:57 PM  Samul Dada, PT PhD Acute Rehab Dept. Number: [facility] [id number] and [facility] [phone]

## 2021-03-23 NOTE — Progress Notes (Addendum)
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 Has patient had a PCN reaction causing immediate rash, facial/tongue/throat swelling, SOB or lightheadedness with hypotension: YES Has patient had a PCN reaction causing severe rash involving mucus membranes or skin necrosis: NO Has patient had a PCN reaction that required hospitalizationNO Has patient had a PCN reaction occurring within the last 10 years: NO If all of the above answers are "NO", then may proceed with Cephalosporin use.   Propofol     Increased blood pressure and heart rate      Vital Signs: Temp: 98.1 F (36.7 C) (12/02 0805) Temp Source: Oral (12/02 0805) BP: 118/85 (12/02 0805) Pulse Rate: 67 (12/02 0805)  Labs: Recent Labs    03/22/21 0524 03/22/21 0533 03/22/21 1511 03/23/21 0118  HGB 15.3 15.0  --   --   HCT 43.9 44.0  --   --   PLT 227  --   --   --   APTT 26  --  36 48*  LABPROT 15.6*  --   --   --   INR 1.2  --   --   --   HEPARINUNFRC  --   --  1.05* 0.47  CREATININE 0.97 0.80  --   --      Estimated Creatinine Clearance: 133.4 mL/min (by C-G formula based on SCr of 0.8 mg/dL).   Medical History: Past Medical History:  Diagnosis Date   Anxiety    CVA (cerebral vascular accident) (HCC)    HLD (hyperlipidemia)      Assessment: 45 y/o M with previous stroke on Xarelto PTA for secondary prophylaxis. He  presented to the ED 03/22/21 AM with dizziness/numbness of the right side. CT angio with acute dissection of the distal left vertebral artery. MRI with patchy acute infarcts in the cerebellum.   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   He did not receive thrombolytics Prior to admission his last dose of Xarelto was 11/30 at ~1730.  Xarelto held on admission and Heparin infusion started as discussed with Neurology>  On 03/23/21  pharmacy consulted to transition back to Xarelto.  Adding aspirin per Neuro until repeat CTA in 2-3 months..  Echo EF 60-65% RV normal. No thrombus.    Plan:   Stop IV heparin drip. Transition back to Xarelto 20mg  daily  Monitor closely for bleeding/neuro changes  , Noah Delaine Clinical Pharmacist [phone] 03/23/2021 9:20 AM  Please check AMION for all [facility] phone numbers After 10:00 PM, call Main Pharmacy [phone]

## 2021-03-23 NOTE — TOC Initial Note (Signed)
Transition of Care [facility]) - Initial/Assessment Note    Patient Details  Name: Jake Moore MRN: [id number] Date of Birth: 1976-02-25  Transition of Care [facility]) CM/SW Contact:    Elliot Cousin, RN Phone Number: [phone] 03/23/2021, 6:03 PM  Clinical Narrative:                 HF TOC CM spoke to pt and states he has been on Xarelto. He is agreeable to Outpt PT on [address]. Attending made aware.   Expected Discharge Plan: OP Rehab Barriers to Discharge: No Barriers Identified   Patient Goals and CMS Choice Patient states their goals for this hospitalization and ongoing recovery are:: feels good to go home CMS Medicare.gov Compare Post Acute Care list provided to:: Patient    Expected Discharge Plan and Services Expected Discharge Plan: OP Rehab In-house Referral: Clinical Social Work Discharge Planning Services: CM Consult   Living arrangements for the past 2 months: Single Family Home                                      Prior Living Arrangements/Services Living arrangements for the past 2 months: Single Family Home Lives with:: Spouse Patient language and need for interpreter reviewed:: Yes        Need for Family Participation in Patient Care: No (Comment) Care giver support system in place?: No (comment)      Activities of Daily Living Home Assistive Devices/Equipment: None ADL Screening (condition at time of admission) Patient's cognitive ability adequate to safely complete daily activities?: Yes Is the patient deaf or have difficulty hearing?: No Does the patient have difficulty seeing, even when wearing glasses/contacts?: No Does the patient have difficulty concentrating, remembering, or making decisions?: No Patient able to express need for assistance with ADLs?: Yes Does the patient have difficulty dressing or bathing?: No Independently performs ADLs?: Yes (appropriate for developmental age) Does the patient have difficulty walking or  climbing stairs?: No Weakness of Legs: Both Weakness of Arms/Hands: None  Permission Sought/Granted Permission sought to share information with : Case Manager, Family Supports, PCP Permission granted to share information with : Yes, Verbal Permission Granted  Share Information with NAME: Adric Wrede  Permission granted to share info w AGENCY: Outpt PT  Permission granted to share info w Relationship: wife  Permission granted to share info w Contact Information: [phone]  Emotional Assessment Appearance:: Appears stated age Attitude/Demeanor/Rapport: Gracious Affect (typically observed): Accepting Orientation: : Oriented to Self, Oriented to Place, Oriented to  Time, Oriented to Situation   Psych Involvement: No (comment)  Admission diagnosis:  CVA (cerebral vascular accident) (HCC) [I63.9] Acute ischemic stroke (HCC) [I63.9] COVID-19 [U07.1] Patient Active Problem List   Diagnosis Date Noted   Dissection, 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   Alcohol abuse 03/22/2021   History of CVA (cerebrovascular accident) 03/22/2021   Dizziness 05/29/2017   AKI (acute kidney injury) (HCC)    Blood glucose elevated    Generalized anxiety disorder    Transaminitis    Acute ischemic stroke (HCC) 04/17/2017   Ataxia due to recent stroke    Altered sensation due to recent stroke    Anxiety state    Attention deficit disorder    Hypokalemia    Stroke, Wallenberg's syndrome    Stroke (HCC) 04/11/2017   Hyperlipidemia 04/11/2017   Occlusion and stenosis  of vertebral artery 04/11/2017   Anxiety    PCP:  Cleatis Polka., MD Pharmacy:   [facility] [place], [place] - [address] [place] [address] Phone: [phone] Fax: [phone]     Social Determinants of Health (SDOH) Interventions    Readmission Risk Interventions No flowsheet data found.

## 2021-03-23 NOTE — Progress Notes (Signed)
ANTICOAGULATION CONSULT NOTE   Pharmacy Consult for Heparin  Indication: Acute left vertebral artery dissection  Allergies  Allergen Reactions   Penicillins Hives    Has patient had a PCN reaction causing immediate rash, facial/tongue/throat swelling, SOB or lightheadedness with hypotension: YES Has patient had a PCN reaction causing severe rash involving mucus membranes or skin necrosis: NO Has patient had a PCN reaction that required hospitalizationNO Has patient had a PCN reaction occurring within the last 10 years: NO If all of the above answers are "NO", then may proceed with Cephalosporin use. Has patient had a PCN reaction causing immediate rash, facial/tongue/throat swelling, SOB or lightheadedness with hypotension: YES Has patient had a PCN reaction causing severe rash involving mucus membranes or skin necrosis: NO Has patient had a PCN reaction that required hospitalizationNO Has patient had a PCN reaction occurring within the last 10 years: NO If all of the above answers are "NO", then may proceed with Cephalosporin use.   Propofol     Increased blood pressure and heart rate      Vital Signs: Temp: 98 F (36.7 C) (12/02 0016) Temp Source: Oral (12/02 0016) BP: 107/89 (12/02 0016) Pulse Rate: 85 (12/02 0016)  Labs: Recent Labs    03/22/21 0524 03/22/21 0533 03/22/21 1511 03/23/21 0118  HGB 15.3 15.0  --   --   HCT 43.9 44.0  --   --   PLT 227  --   --   --   APTT 26  --  36 48*  LABPROT 15.6*  --   --   --   INR 1.2  --   --   --   HEPARINUNFRC  --   --  1.05* 0.47  CREATININE 0.97 0.80  --   --      Estimated Creatinine Clearance: 133.4 mL/min (by C-G formula based on SCr of 0.8 mg/dL).   Medical History: Past Medical History:  Diagnosis Date   Anxiety    CVA (cerebral vascular accident) (HCC)    HLD (hyperlipidemia)      Assessment: 45 y/o M with previous stroke on Xarelto PTA for secondary prophylaxis. He presents to the ED this AM with  dizziness/numbness of the right side. CT angio with acute dissection of the distal left vertebral artery. MRI with patchy acute infarcts in the cerebellum.   Last dose of Xarelto was 11/30 at ~1730. Discussed with Neurology, will start heparin now. Anticipate using aPTT to dose.  12/2 AM update:  aPTT below goal  Goal of Therapy:  Heparin level 0.3-0.5 units/ml APTT 66-84 secs Monitor platelets by anticoagulation protocol: Yes   Plan:  No boluses Inc heparin to 1300 units/hr 1000 heparin level and aPTT Daily CBC, heparin level, and aPTT Monitor closely for bleeding/neuro changes May go back to xarelto 12/2 if doing well  Abran Duke, PharmD, BCPS Clinical Pharmacist Phone: [phone]

## 2021-03-24 LAB — CBC
HCT: 41.9 % (ref 39.0–52.0)
Hemoglobin: 14.2 g/dL (ref 13.0–17.0)
MCH: 31.2 pg (ref 26.0–34.0)
MCHC: 33.9 g/dL (ref 30.0–36.0)
MCV: 92.1 fL (ref 80.0–100.0)
Platelets: 208 10*3/uL (ref 150–400)
RBC: 4.55 MIL/uL (ref 4.22–5.81)
RDW: 11.7 % (ref 11.5–15.5)
WBC: 5.6 10*3/uL (ref 4.0–10.5)
nRBC: 0 % (ref 0.0–0.2)

## 2021-03-24 MED ORDER — ASPIRIN 81 MG PO TBEC: 81.0000 mg | DELAYED_RELEASE_TABLET | Freq: Every day | ORAL | 11 refills | Status: AC

## 2021-03-24 MED ORDER — RIVAROXABAN 20 MG PO TABS: 20.0000 mg | ORAL_TABLET | Freq: Every day | ORAL | 3 refills | Status: DC

## 2021-03-24 NOTE — Discharge Summary (Addendum)
Physician Discharge Summary  Jake Moore:[id number] DOB: [birth year] DOA: 03/22/2021  PCP: Cleatis Polka., MD  Admit date: 03/22/2021 Discharge date: 03/24/2021  Admitted From: Home Disposition:  Home  Recommendations for Outpatient Follow-up:  Follow up with Neurology in 1-2 weeks, will need a follow-up CT angio of the head and neck in 3 months. Please obtain BMP/CBC in one week   Home Health:Yes Equipment/Devices:None  Discharge Condition:Stable CODE STATUS:Full Diet recommendation: Heart Healthy  Brief/Interim Summary: 45 y.o. male past medical history significant for CVA, hyperlipidemia anxiety, who recently tested positive on 03/13/2021 for COVID-19 who comes in complaining of dizziness and right-sided numbness MRI of the brain showed new acute infarct in the cerebellum left SCA territory ,left posterior cerebellum and right SCA territory, CT of the head showed no acute intracranial findings, CT angio of the head and neck show acute dissection of the distal left vertebral artery in the V3 segment, carotid arteries appears to be stable negative  Discharge Diagnoses:  Principal Problem:   Acute ischemic stroke (HCC) Active Problems:   Hyperlipidemia   Hypokalemia   Generalized anxiety disorder   Transaminitis   Dissection, vertebral artery (HCC)   Disorder of lipoprotein and lipid metabolism   Alcohol abuse   History of CVA (cerebrovascular accident)  Acute embolic stroke with left PICA and bilateral is see a left VA dissection: Physical therapy and Occupational Therapy evaluated the patient recommended home health PT. CT angio of the head and neck showed acute dissection of the left distal vertebral artery at the V3 segment. 2D echo was done that showed an EF of 60% no wall motion abnormalities. He was started on admission on IV heparin and aspirin, then transition to aspirin and Xarelto which she will continue as an outpatient. Neurology will repeat a CT  angio of the head and neck in 3 months. Lower extremity Doppler was negative for DVT  COVID-19 viral infection: Diagnosis PCP office in November 2022, retested in the hospital was positive. It is no longer active. He was continued on inhalers denied any shortness of breath and was asymptomatic.  Hypokalemia: Was repleted orally, now resolved.  History of CVA: Follow-up with neurology as an outpatient, continue statins.  Alcohol use: EtOH level on admission of 20 showed no signs of withdrawal he was started on thiamine and folate.  Elevated LFTs: Likely due to alcohol use.    Discharge Instructions  Discharge Instructions     Ambulatory referral to Neurology   Complete by: As directed    Follow up with Dr. Pearlean Brownie at [facility] in 4-6 weeks. Pt is Dr. Marlis Edelson pt. Thanks.   Ambulatory referral to Physical Therapy   Complete by: As directed    Iontophoresis - 4 mg/ml of dexamethasone: No   T.E.N.S. Unit Evaluation and Dispense as Indicated: No   Diet - low sodium heart healthy   Complete by: As directed    Increase activity slowly   Complete by: As directed       Allergies as of 03/24/2021       Reactions   Penicillins Hives   Has patient had a PCN reaction causing immediate rash, facial/tongue/throat swelling, SOB or lightheadedness with hypotension: YES Has patient had a PCN reaction causing severe rash involving mucus membranes or skin necrosis: NO Has patient had a PCN reaction that required hospitalizationNO Has patient had a PCN reaction occurring within the last 10 years: NO If all of the above answers are "NO", then may proceed with Cephalosporin  use. Has patient had a PCN reaction causing immediate rash, facial/tongue/throat swelling, SOB or lightheadedness with hypotension: YES Has patient had a PCN reaction causing severe rash involving mucus membranes or skin necrosis: NO Has patient had a PCN reaction that required hospitalizationNO Has patient had a PCN reaction  occurring within the last 10 years: NO If all of the above answers are "NO", then may proceed with Cephalosporin use.   Propofol    Increased blood pressure and heart rate        Medication List     TAKE these medications    aspirin 81 MG EC tablet Take 1 tablet (81 mg total) by mouth daily. Swallow whole.   FLUoxetine 10 MG capsule Commonly known as: PROZAC Take 10 mg by mouth every evening.   NON FORMULARY Inject 1 Dose as directed every 30 (thirty) days. Study drug   Repatha SureClick 140 MG/ML Soaj Generic drug: Evolocumab INJECT 1 PEN INTO SKIN EVERY 14 DAYS What changed: See the new instructions.   rivaroxaban 20 MG Tabs tablet Commonly known as: Xarelto Take 1 tablet (20 mg total) by mouth daily with supper. What changed: See the new instructions.   rosuvastatin 20 MG tablet Commonly known as: CRESTOR TAKE ONE TABLET DAILY        Follow-up Information     Micki Riley, MD. Schedule an appointment as soon as possible for a visit in 1 month(s).   Specialties: Neurology, Radiology Why: stroke clinic Contact information: [address] [place] [address] [phone]         [facility].   Specialty: Rehabilitation Why: outpatient rehab will give a call to schedule appointment Contact information: [address] [id number] mc [place] [address] [phone]               Allergies  Allergen Reactions   Penicillins Hives    Has patient had a PCN reaction causing immediate rash, facial/tongue/throat swelling, SOB or lightheadedness with hypotension: YES Has patient had a PCN reaction causing severe rash involving mucus membranes or skin necrosis: NO Has patient had a PCN reaction that required hospitalizationNO Has patient had a PCN reaction occurring within the last 10 years: NO If all of the above answers are "NO", then may proceed with  Cephalosporin use. Has patient had a PCN reaction causing immediate rash, facial/tongue/throat swelling, SOB or lightheadedness with hypotension: YES Has patient had a PCN reaction causing severe rash involving mucus membranes or skin necrosis: NO Has patient had a PCN reaction that required hospitalizationNO Has patient had a PCN reaction occurring within the last 10 years: NO If all of the above answers are "NO", then may proceed with Cephalosporin use.   Propofol     Increased blood pressure and heart rate    Consultations: Neurology Cardiology   Procedures/Studies: MR BRAIN WO CONTRAST  Result Date: 03/22/2021 CLINICAL DATA:  45 year old male with a history distal right vertebral artery occlusion and right PICA infarct in 2018. Code stroke presentation, with acute distal left vertebral artery dissection on CTA. EXAM: MRI HEAD WITHOUT CONTRAST TECHNIQUE: Multiplanar, multiecho pulse sequences of the brain and surrounding structures were obtained without intravenous contrast. COMPARISON:  CTA head and neck today.  Brain MRI 05/29/2017. FINDINGS: Brain: There is patchy mildly to moderately restricted diffusion in the left superior cerebellum SCA territory (series 5, image 74) with additional posterior central cerebellar hemisphere restriction (series 5, image 68). No medulla or brainstem involvement  identified. However, there is a subtle area of contralateral right SCA involvement (series 5, image 72). No thalamic or other posterior circulation restricted diffusion. And only subtle FLAIR hyperintensity in the areas of abnormal cerebellar diffusion. No associated hemorrhage. A small chronic area of cystic encephalomalacia in the right lateral medulla has decreased since 2019 and is subtle now (series 15, image 6). Supratentorial gray and white matter signal remains normal. No midline shift, mass effect, evidence of mass lesion, ventriculomegaly, extra-axial collection or acute intracranial  hemorrhage. Cervicomedullary junction and pituitary are within normal limits. Vascular: Major intracranial vascular flow voids are stable despite the abnormal left vertebral V3 segment which is partially visible on series 15, image 3 of this exam. Skull and upper cervical spine: Negative. Visualized bone marrow signal is within normal limits. Sinuses/Orbits: Negative orbits. Mild to moderate paranasal sinus mucosal thickening as on the earlier CT. Other: Mastoids are clear. Visible internal auditory structures appear normal. IMPRESSION: 1. Patchy acute infarcts in the Cerebellum. Left SCA territory primarily affected, but lesser involvement in the left posterior cerebellum and right SCA territory. No associated hemorrhage or mass effect. 2. No other acute intracranial abnormality. Subtle chronic encephalomalacia in the right lateral medulla. Electronically Signed   By: Odessa Fleming M.D.   On: 03/22/2021 06:24   DG CHEST PORT 1 VIEW  Result Date: 03/22/2021 CLINICAL DATA:  COVID-19 EXAM: PORTABLE CHEST 1 VIEW COMPARISON:  Chest x-ray dated July 14, 2016 FINDINGS: The heart size and mediastinal contours are within normal limits. Both lungs are clear. The visualized skeletal structures are unremarkable. IMPRESSION: No active disease. Electronically Signed   By: Allegra Lai M.D.   On: 03/22/2021 08:05   ECHOCARDIOGRAM COMPLETE  Result Date: 03/22/2021    ECHOCARDIOGRAM REPORT   Patient Name:   Jake Moore [facility] Date of Exam: 03/22/2021 Medical Rec #:  [id number]         Height:       70.0 in Accession #:    [id number]        Weight:       196.2 lb Date of Birth:  09-13-1975         BSA:          2.070 m Patient Age:    44 years          BP:           125/90 mmHg Patient Gender: M                 HR:           71 bpm. Exam Location:  Inpatient Procedure: 2D Echo, Color Doppler and Cardiac Doppler Indications:    CVA  History:        Patient has prior history of Echocardiogram examinations, most                  recent 04/12/2017. Risk Factors:Dyslipidemia.  Sonographer:    Gertie Fey MHA, RDMS, RVT, RDCS Referring Phys: [id number] [facility] A SMITH  Sonographer Comments: Image acquisition challenging due to respiratory motion. IMPRESSIONS  1. Left ventricular ejection fraction, by estimation, is 60 to 65%. The left ventricle has normal function. The left ventricle has no regional wall motion abnormalities. Left ventricular diastolic parameters were normal.  2. Right ventricular systolic function is normal. The right ventricular size is normal.  3. The mitral valve is normal in structure. No evidence of mitral valve regurgitation. No evidence of mitral stenosis.  4. The aortic valve is  normal in structure. Aortic valve regurgitation is trivial. No aortic stenosis is present. Comparison(s): Prior images unable to be directly viewed, comparison made by report only. Conclusion(s)/Recommendation(s): No intracardiac source of embolism detected on this transthoracic study. Consider a transesophageal echocardiogram to exclude cardiac source of embolism if clinically indicated. FINDINGS  Left Ventricle: Left ventricular ejection fraction, by estimation, is 60 to 65%. The left ventricle has normal function. The left ventricle has no regional wall motion abnormalities. The left ventricular internal cavity size was normal in size. There is  no left ventricular hypertrophy. Left ventricular diastolic parameters were normal. Right Ventricle: The right ventricular size is normal. Right ventricular systolic function is normal. Left Atrium: Left atrial size was normal in size. Right Atrium: Right atrial size was normal in size. Pericardium: There is no evidence of pericardial effusion. Mitral Valve: The mitral valve is normal in structure. No evidence of mitral valve regurgitation. No evidence of mitral valve stenosis. Tricuspid Valve: The tricuspid valve is normal in structure. Tricuspid valve regurgitation is trivial. No evidence of  tricuspid stenosis. Aortic Valve: The aortic valve is normal in structure. Aortic valve regurgitation is trivial. No aortic stenosis is present. Aortic valve mean gradient measures 2.0 mmHg. Aortic valve peak gradient measures 3.7 mmHg. Aortic valve area, by VTI measures 3.21 cm. Pulmonic Valve: The pulmonic valve was normal in structure. Pulmonic valve regurgitation is not visualized. No evidence of pulmonic stenosis. Aorta: The aortic root is normal in size and structure. Venous: The inferior vena cava was not well visualized. IAS/Shunts: No atrial level shunt detected by color flow Doppler.  LEFT VENTRICLE PLAX 2D LVIDd:         4.80 cm   Diastology LVIDs:         3.50 cm   LV e' medial:    9.57 cm/s LV PW:         0.60 cm   LV E/e' medial:  10.2 LV IVS:        0.40 cm   LV e' lateral:   9.36 cm/s LVOT diam:     2.40 cm   LV E/e' lateral: 10.5 LV SV:         61 LV SV Index:   29 LVOT Area:     4.52 cm  RIGHT VENTRICLE RV S prime:     7.72 cm/s TAPSE (M-mode): 2.5 cm LEFT ATRIUM             Index        RIGHT ATRIUM          Index LA diam:        2.40 cm 1.16 cm/m   RA Area:     7.77 cm LA Vol (A2C):   22.9 ml 11.06 ml/m  RA Volume:   10.60 ml 5.12 ml/m LA Vol (A4C):   28.7 ml 13.86 ml/m LA Biplane Vol: 26.4 ml 12.75 ml/m  AORTIC VALVE AV Area (Vmax):    3.59 cm AV Area (Vmean):   3.61 cm AV Area (VTI):     3.21 cm AV Vmax:           96.50 cm/s AV Vmean:          72.900 cm/s AV VTI:            0.189 m AV Peak Grad:      3.7 mmHg AV Mean Grad:      2.0 mmHg LVOT Vmax:         76.50 cm/s LVOT  Vmean:        58.200 cm/s LVOT VTI:          0.134 m LVOT/AV VTI ratio: 0.71  AORTA Ao Root diam: 3.50 cm MITRAL VALVE MV Area (PHT): 2.57 cm    SHUNTS MV Decel Time: 295 msec    Systemic VTI:  0.13 m MV E velocity: 97.90 cm/s  Systemic Diam: 2.40 cm MV A velocity: 74.70 cm/s MV E/A ratio:  1.31 Olga Millers MD Electronically signed by Olga Millers MD Signature Date/Time: 03/22/2021/3:06:20 PM    Final    CT  HEAD CODE STROKE WO CONTRAST  Result Date: 03/22/2021 CLINICAL DATA:  Code stroke. 44 year old male with blurred vision and right side deficits. EXAM: CT HEAD WITHOUT CONTRAST TECHNIQUE: Contiguous axial images were obtained from the base of the skull through the vertex without intravenous contrast. COMPARISON:  Brain MRI 05/29/2017.  Head CT 10/29/2019. FINDINGS: Brain: Stable cerebral volume since 2021, stable and normal cerebral volume. No midline shift, ventriculomegaly, mass effect, evidence of mass lesion, intracranial hemorrhage or evidence of cortically based acute infarction. Gray-white matter differentiation is within normal limits throughout the brain, with subtle encephalomalacia of the dorsal right medulla better demonstrated by MRI. Vascular: No suspicious intracranial vascular hyperdensity. Skull: Congenital incomplete ossification of the posterior C1 ring. No acute osseous abnormality identified. Sinuses/Orbits: Mild to moderate scattered new posterior ethmoid and sphenoid sinus mucosal thickening. Tympanic cavities and mastoids remain clear. Other: Visualized orbits and scalp soft tissues are within normal limits. ASPECTS [facility] Stroke Program Early CT Score) Total score (0-10 with 10 being normal): 10 IMPRESSION: 1. Stable since 2021 and virtually normal noncontrast CT appearance of the brain. ASPECTS 10. 2. Mild to moderate new ethmoid and sphenoid paranasal sinus disease. 3. These results were communicated to Dr. Wilford Corner at 5:39 am on 03/22/2021 by text page via the [facility] messaging system. Electronically Signed   By: Odessa Fleming M.D.   On: 03/22/2021 05:39   VAS [place] LOWER EXTREMITY VENOUS (DVT)  Result Date: 03/22/2021  Lower Venous DVT Study Patient Name:  Jake Moore [facility]  Date of Exam:   03/22/2021 Medical Rec #: [id number]          Accession #:    [id number] Date of Birth: [birth year]          Patient Gender: M Patient Age:   11 years Exam Location:  [facility] Procedure:      VAS [place]  LOWER EXTREMITY VENOUS (DVT) Referring Phys: Marca Ancona --------------------------------------------------------------------------------  Indications: Stroke.  Risk Factors: Lp(a) elevation, covid+. Anticoagulation: Xarelto. Comparison Study: Previous exam on 04/14/2017 was negative for DVT. Performing Technologist: Ernestene Mention RVT, RDMS  Examination Guidelines: A complete evaluation includes B-mode imaging, spectral Doppler, color Doppler, and power Doppler as needed of all accessible portions of each vessel. Bilateral testing is considered an integral part of a complete examination. Limited examinations for reoccurring indications may be performed as noted. The reflux portion of the exam is performed with the patient in reverse Trendelenburg.  +---------+---------------+---------+-----------+----------+--------------+ RIGHT    CompressibilityPhasicitySpontaneityPropertiesThrombus Aging +---------+---------------+---------+-----------+----------+--------------+ CFV      Full           Yes      Yes                                 +---------+---------------+---------+-----------+----------+--------------+ SFJ      Full                                                        +---------+---------------+---------+-----------+----------+--------------+  FV Prox  Full           Yes      Yes                                 +---------+---------------+---------+-----------+----------+--------------+ FV Mid   Full           Yes      Yes                                 +---------+---------------+---------+-----------+----------+--------------+ FV DistalFull           Yes      Yes                                 +---------+---------------+---------+-----------+----------+--------------+ PFV      Full                                                        +---------+---------------+---------+-----------+----------+--------------+ POP      Full           Yes      Yes                                  +---------+---------------+---------+-----------+----------+--------------+ PTV      Full                                                        +---------+---------------+---------+-----------+----------+--------------+ PERO     Full                                                        +---------+---------------+---------+-----------+----------+--------------+   +---------+---------------+---------+-----------+----------+--------------+ LEFT     CompressibilityPhasicitySpontaneityPropertiesThrombus Aging +---------+---------------+---------+-----------+----------+--------------+ CFV      Full           Yes      Yes                                 +---------+---------------+---------+-----------+----------+--------------+ SFJ      Full                                                        +---------+---------------+---------+-----------+----------+--------------+ FV Prox  Full           Yes      Yes                                 +---------+---------------+---------+-----------+----------+--------------+ FV Mid   Full  Yes      Yes                                 +---------+---------------+---------+-----------+----------+--------------+ FV DistalFull           Yes      Yes                                 +---------+---------------+---------+-----------+----------+--------------+ PFV      Full                                                        +---------+---------------+---------+-----------+----------+--------------+ POP      Full           Yes      Yes                                 +---------+---------------+---------+-----------+----------+--------------+ PTV      Full                                                        +---------+---------------+---------+-----------+----------+--------------+ PERO     Full                                                         +---------+---------------+---------+-----------+----------+--------------+     Summary: BILATERAL: - No evidence of deep vein thrombosis seen in the lower extremities, bilaterally. - No evidence of superficial venous thrombosis in the lower extremities, bilaterally. -No evidence of popliteal cyst, bilaterally.   *See table(s) above for measurements and observations. Electronically signed by Sherald Hess MD on 03/22/2021 at 5:27:59 PM.    Final    CT ANGIO HEAD NECK W WO CM (CODE STROKE)  Result Date: 03/22/2021 CLINICAL DATA:  45 year old male with a history of abnormal distal right vertebral artery. Right PICA infarct in 2018. Code stroke presentation. EXAM: CT ANGIOGRAPHY HEAD AND NECK TECHNIQUE: Multidetector CT imaging of the head and neck was performed using the standard protocol during bolus administration of intravenous contrast. Multiplanar CT image reconstructions and MIPs were obtained to evaluate the vascular anatomy. Carotid stenosis measurements (when applicable) are obtained utilizing NASCET criteria, using the distal internal carotid diameter as the denominator. CONTRAST:  100 mL Omnipaque 350 COMPARISON:  CTA head and neck 10/29/2019.  Plain head CT today. FINDINGS: CTA NECK Skeleton: No acute osseous abnormality identified. Congenital incomplete ossification of the posterior C1 ring. New right maxillary, posterior ethmoid and sphenoid sinus mucosal thickening. Upper chest: Negative. Other neck: Within normal limits. Aortic arch: 3 vessel arch configuration.  No arch atherosclerosis. Right carotid system: Negative. Left carotid system: Negative. Vertebral arteries: Normal proximal right subclavian artery and right vertebral artery origin. Non dominant appearance of the right vertebral artery not significantly changed since 2021. The vessel remains patent to the skull base  with no stenosis. Normal proximal left subclavian artery and left vertebral artery origin. Stable dominant appearance of  the left vertebral artery in the V1 and V2 segments. However, new distal left vertebral artery dissection and high-grade stenosis beginning at the left V3 segment with subtotal thrombosed lumen at the C1 level. See series 7, images 170-161. Improved enhancement of the vessel at the skull base. Preliminary results of the above were communicated to Dr. Wilford Corner at 5:47 am on 03/22/2021 by text page via the [facility] messaging system. CTA HEAD Posterior circulation: Despite the left V3 appearance the left vertebral V4 segment remains patent and appears fairly stable compared to 2021. Left PICA and left vertebrobasilar junction remain patent. Chronic diminutive distal right vertebral which terminates in PICA, chronic right V4 segment occlusion distal to the right PICA. Stable diminutive and mildly irregular basilar artery enhancement to the basilar tip. Bilateral SCA and PCA origins are stable and patent. Both posterior communicating arteries present as before. Bilateral PCA branches appear stable since 2021, with mild irregularity bilaterally. Anterior circulation: Both ICA siphons are patent and within normal limits. Normal posterior communicating and left ophthalmic artery origins. Patent carotid termini. MCA and ACA origins remain normal. Normal anterior communicating artery. ACA branches are stable, the right A2 is dominant. Bilateral MCA M1 segments and bifurcations are patent. Bilateral MCA branches are stable since 2021. Venous sinuses: Early contrast timing. The superior sagittal sinus is patent. Anatomic variants: None. Review of the MIP images confirms the above findings IMPRESSION: 1. Acute Dissection Distal Left Vertebral Artery, in the V3 segment with associated high-grade stenosis. This left vertebral is the sole supply to the Basilar Artery, but the left V4 segment and remaining posterior circulation appear stable since a 2021 CTA. 2. Chronically occluded right vertebral V4 segment. Stable diminutive right  vertebral artery terminating in PICA. 3. Bilateral carotid arteries appear stable and negative. 4. But mild generalized irregular appearance of the anterior and posterior circulation vessels is noted and stable since 2021. Study discussed by telephone with Dr. Milon Dikes on 03/22/2021 at 05:52 . Electronically Signed   By: Odessa Fleming M.D.   On: 03/22/2021 05:55   (Echo, Carotid, EGD, Colonoscopy, ERCP)    Subjective: No complaints  Discharge Exam: Vitals:   03/24/21 0019 03/24/21 0335  BP: 116/74   Pulse: 66 69  Resp:    Temp: 98.3 F (36.8 C) 98 F (36.7 C)  SpO2: 98% 97%   Vitals:   03/23/21 1648 03/23/21 2022 03/24/21 0019 03/24/21 0335  BP: 119/83 120/81 116/74   Pulse: 73 75 66 69  Resp: 15     Temp: 98 F (36.7 C) 98 F (36.7 C) 98.3 F (36.8 C) 98 F (36.7 C)  TempSrc: Oral Oral Oral Oral  SpO2: 96% 95% 98% 97%  Weight:      Height:        General: Pt is alert, awake, not in acute distress Cardiovascular: RRR, S1/S2 +, no rubs, no gallops Respiratory: CTA bilaterally, no wheezing, no rhonchi Abdominal: Soft, NT, ND, bowel sounds + Extremities: no edema, no cyanosis    The results of significant diagnostics from this hospitalization (including imaging, microbiology, ancillary and laboratory) are listed below for reference.     Microbiology: Recent Results (from the past 240 hour(s))  Resp Panel by RT-PCR (Flu A&B, Covid) Nasopharyngeal Swab     Status: Abnormal   Collection Time: 03/22/21  5:18 AM   Specimen: Nasopharyngeal Swab; Nasopharyngeal(NP) swabs in vial transport medium  Result Value Ref Range Status   SARS Coronavirus 2 by RT PCR POSITIVE (A) NEGATIVE Final    Comment: RESULT CALLED TO, READ BACK BY AND VERIFIED WITH: RN K RAND [phone] MLM (NOTE) SARS-CoV-2 target nucleic acids are DETECTED.  The SARS-CoV-2 RNA is generally detectable in upper respiratory specimens during the acute phase of infection. Positive results are indicative of the  presence of the identified virus, but do not rule out bacterial infection or co-infection with other pathogens not detected by the test. Clinical correlation with patient history and other diagnostic information is necessary to determine patient infection status. The expected result is Negative.  Fact Sheet for Patients: BloggerCourse.com  Fact Sheet for Healthcare Providers: SeriousBroker.it  This test is not yet approved or cleared by the [place] FDA and  has been authorized for detection and/or diagnosis of SARS-CoV-2 by FDA under an Emergency Use Authorization (EUA).  This EUA will remain in effect (meaning this test can be used) f or the duration of  the COVID-19 declaration under Section 564(b)(1) of the Act, 21 U.S.C. section 360bbb-3(b)(1), unless the authorization is terminated or revoked sooner.     Influenza A by PCR NEGATIVE NEGATIVE Final   Influenza B by PCR NEGATIVE NEGATIVE Final    Comment: (NOTE) The Xpert Xpress SARS-CoV-2/FLU/RSV plus assay is intended as an aid in the diagnosis of influenza from Nasopharyngeal swab specimens and should not be used as a sole basis for treatment. Nasal washings and aspirates are unacceptable for Xpert Xpress SARS-CoV-2/FLU/RSV testing.  Fact Sheet for Patients: BloggerCourse.com  Fact Sheet for Healthcare Providers: SeriousBroker.it  This test is not yet approved or cleared by the [place] FDA and has been authorized for detection and/or diagnosis of SARS-CoV-2 by FDA under an Emergency Use Authorization (EUA). This EUA will remain in effect (meaning this test can be used) for the duration of the COVID-19 declaration under Section 564(b)(1) of the Act, 21 U.S.C. section 360bbb-3(b)(1), unless the authorization is terminated or revoked.  Performed at [facility], [address], [place], [place] [address]       Labs: BNP (last 3 results) No results for input(s): BNP in the last 8760 hours. Basic Metabolic Panel: Recent Labs  Lab 03/22/21 0524 03/22/21 0533 03/23/21 0118 03/23/21 1023  NA 140 141  --  137  K 3.4* 3.4*  --  4.1  CL 108 108  --  109  CO2 18*  --   --  22  GLUCOSE 113* 110*  --  85  BUN 12 12  --  9  CREATININE 0.97 0.80  --  0.82  CALCIUM 8.8*  --   --  8.8*  MG  --   --  1.9  --   PHOS  --   --  3.6  --    Liver Function Tests: Recent Labs  Lab 03/22/21 0524  AST 49*  ALT 30  ALKPHOS 63  BILITOT 0.8  PROT 6.6  ALBUMIN 4.0   No results for input(s): LIPASE, AMYLASE in the last 168 hours. No results for input(s): AMMONIA in the last 168 hours. CBC: Recent Labs  Lab 03/22/21 0524 03/22/21 0533 03/24/21 0310  WBC 6.2  --  5.6  NEUTROABS 2.6  --   --   HGB 15.3 15.0 14.2  HCT 43.9 44.0 41.9  MCV 90.5  --  92.1  PLT 227  --  208   Cardiac Enzymes: No results for input(s): CKTOTAL, CKMB, CKMBINDEX,  TROPONINI in the last 168 hours. BNP: Invalid input(s): POCBNP CBG: Recent Labs  Lab 03/22/21 0649  GLUCAP 116*   D-Dimer No results for input(s): DDIMER in the last 72 hours. Hgb A1c Recent Labs    03/22/21 0833  HGBA1C 5.2   Lipid Profile Recent Labs    03/22/21 0833  CHOL 133  HDL 65  LDLCALC 59  TRIG 43  CHOLHDL 2.0   Thyroid function studies No results for input(s): TSH, T4TOTAL, T3FREE, THYROIDAB in the last 72 hours.  Invalid input(s): FREET3 Anemia work up No results for input(s): VITAMINB12, FOLATE, FERRITIN, TIBC, IRON, RETICCTPCT in the last 72 hours. Urinalysis    Component Value Date/Time   COLORURINE YELLOW 03/22/2021 0910   APPEARANCEUR CLEAR 03/22/2021 0910   LABSPEC 1.010 03/22/2021 0910   PHURINE 6.5 03/22/2021 0910   GLUCOSEU NEGATIVE 03/22/2021 0910   HGBUR NEGATIVE 03/22/2021 0910   BILIRUBINUR NEGATIVE 03/22/2021 0910   KETONESUR 15 (A) 03/22/2021 0910   PROTEINUR NEGATIVE 03/22/2021 0910   NITRITE  NEGATIVE 03/22/2021 0910   LEUKOCYTESUR NEGATIVE 03/22/2021 0910   Sepsis Labs Invalid input(s): PROCALCITONIN,  WBC,  LACTICIDVEN Microbiology Recent Results (from the past 240 hour(s))  Resp Panel by RT-PCR (Flu A&B, Covid) Nasopharyngeal Swab     Status: Abnormal   Collection Time: 03/22/21  5:18 AM   Specimen: Nasopharyngeal Swab; Nasopharyngeal(NP) swabs in vial transport medium  Result Value Ref Range Status   SARS Coronavirus 2 by RT PCR POSITIVE (A) NEGATIVE Final    Comment: RESULT CALLED TO, READ BACK BY AND VERIFIED WITH: RN K RAND [phone] (NOTE) SARS-CoV-2 target nucleic acids are DETECTED.  The SARS-CoV-2 RNA is generally detectable in upper respiratory specimens during the acute phase of infection. Positive results are indicative of the presence of the identified virus, but do not rule out bacterial infection or co-infection with other pathogens not detected by the test. Clinical correlation with patient history and other diagnostic information is necessary to determine patient infection status. The expected result is Negative.  Fact Sheet for Patients: BloggerCourse.com  Fact Sheet for Healthcare Providers: SeriousBroker.it  This test is not yet approved or cleared by the [place] FDA and  has been authorized for detection and/or diagnosis of SARS-CoV-2 by FDA under an Emergency Use Authorization (EUA).  This EUA will remain in effect (meaning this test can be used) f or the duration of  the COVID-19 declaration under Section 564(b)(1) of the Act, 21 U.S.C. section 360bbb-3(b)(1), unless the authorization is terminated or revoked sooner.     Influenza A by PCR NEGATIVE NEGATIVE Final   Influenza B by PCR NEGATIVE NEGATIVE Final    Comment: (NOTE) The Xpert Xpress SARS-CoV-2/FLU/RSV plus assay is intended as an aid in the diagnosis of influenza from Nasopharyngeal swab specimens and should not be  used as a sole basis for treatment. Nasal washings and aspirates are unacceptable for Xpert Xpress SARS-CoV-2/FLU/RSV testing.  Fact Sheet for Patients: BloggerCourse.com  Fact Sheet for Healthcare Providers: SeriousBroker.it  This test is not yet approved or cleared by the [place] FDA and has been authorized for detection and/or diagnosis of SARS-CoV-2 by FDA under an Emergency Use Authorization (EUA). This EUA will remain in effect (meaning this test can be used) for the duration of the COVID-19 declaration under Section 564(b)(1) of the Act, 21 U.S.C. section 360bbb-3(b)(1), unless the authorization is terminated or revoked.  Performed at [facility], [address], [place], [place] [address]  SIGNED:   Marinda Elk, MD  [facility] 03/24/2021, 7:33 AM Pager   If 7PM-7AM, please contact night-coverage [email or web address] Password [email or web address]

## 2021-03-24 NOTE — Progress Notes (Signed)
SLP Cancellation Note  Patient Details Name: Jake Moore MRN: [id number] DOB: 08-Jul-1975   Cancelled treatment:       Reason Eval/Treat Not Completed: SLP screened, no needs identified, will sign off;Other (comment) (SLP screened patient chart review and per OT and PT evaluations, no cognitive deficit observed. Thank you for this consult. If patient c/o any cognitive changes, please refer for OP SLP evaluation.)   Angela Nevin, MA, CCC-SLP Speech Therapy

## 2021-03-24 NOTE — Plan of Care (Signed)
  Problem: Education: Goal: Knowledge of disease or condition will improve Outcome: Adequate for Discharge Goal: Knowledge of secondary prevention will improve (SELECT ALL) Outcome: Adequate for Discharge Goal: Knowledge of patient specific risk factors will improve (INDIVIDUALIZE FOR PATIENT) Outcome: Adequate for Discharge Goal: Individualized Educational Video(s) Outcome: Adequate for Discharge

## 2021-03-24 NOTE — Discharge Instructions (Addendum)
Information on my medicine - XARELTO (Rivaroxaban)  Why was Xarelto prescribed for you? Xarelto was prescribed for you for prior stroke and to reduce the risk of recurrence.  Aspirin 81 mg daily was added during this hospital admission.   What do you need to know about xarelto ? Take your Xarelto ONCE DAILY at the same time every day with your evening meal. If you have difficulty swallowing the tablet whole, you may crush it and mix in applesauce just prior to taking your dose.  Take Xarelto exactly as prescribed by your doctor and DO NOT stop taking Xarelto without talking to the doctor who prescribed the medication.  Stopping without other stroke prevention medication to take the place of Xarelto may increase your risk of developing a clot that causes a stroke.  Refill your prescription before you run out.  After discharge, you should have regular check-up appointments with your healthcare provider that is prescribing your Xarelto.  In the future your dose may need to be changed if your kidney function or weight changes by a significant amount.  What do you do if you miss a dose? If you are taking Xarelto ONCE DAILY and you miss a dose, take it as soon as you remember on the same day then continue your regularly scheduled once daily regimen the next day. Do not take two doses of Xarelto at the same time or on the same day.   Important Safety Information A possible side effect of Xarelto is bleeding. You should call your healthcare provider right away if you experience any of the following: Bleeding from an injury or your nose that does not stop. Unusual colored urine (red or dark brown) or unusual colored stools (red or black). Unusual bruising for unknown reasons. A serious fall or if you hit your head (even if there is no bleeding).  Some medicines may interact with Xarelto and might increase your risk of bleeding while on Xarelto. To help avoid this, consult your healthcare  provider or pharmacist prior to using any new prescription or non-prescription medications, including herbals, vitamins, non-steroidal anti-inflammatory drugs (NSAIDs) and supplements.  This website has more information on Xarelto: VisitDestination.com.br.

## 2021-03-30 ENCOUNTER — Ambulatory Visit: Payer: BC Managed Care – PPO | Attending: Internal Medicine

## 2021-03-30 ENCOUNTER — Other Ambulatory Visit: Payer: Self-pay

## 2021-03-30 DIAGNOSIS — I63441 Cerebral infarction due to embolism of right cerebellar artery: Secondary | ICD-10-CM | POA: Diagnosis not present

## 2021-03-30 DIAGNOSIS — M6281 Muscle weakness (generalized): Secondary | ICD-10-CM | POA: Insufficient documentation

## 2021-03-30 DIAGNOSIS — U071 COVID-19: Secondary | ICD-10-CM | POA: Insufficient documentation

## 2021-03-30 DIAGNOSIS — R2689 Other abnormalities of gait and mobility: Secondary | ICD-10-CM | POA: Diagnosis not present

## 2021-03-30 NOTE — Therapy (Signed)
[facility], [facility] [address] [place], [place], [address] Phone: [phone]   Fax:  [phone]  Physical Therapy Evaluation  Patient Details  Name: Jake Moore MRN: [id number] Date of Birth: Feb 06, 1976 Referring Provider (PT): Dr. David Stall   Encounter Date: 03/30/2021   PT End of Session - 03/30/21 1441     Visit Number 1    Number of Visits 1    PT Start Time 1400    PT Stop Time 1435    PT Time Calculation (min) 35 min    Equipment Utilized During Treatment Gait belt    Activity Tolerance Patient tolerated treatment well    Behavior During Therapy [facility] for tasks assessed/performed             Past Medical History:  Diagnosis Date   Anxiety    CVA (cerebral vascular accident) (HCC)    HLD (hyperlipidemia)     Past Surgical History:  Procedure Laterality Date   TEE WITHOUT CARDIOVERSION N/A 04/14/2017   Procedure: TRANSESOPHAGEAL ECHOCARDIOGRAM (TEE);  Surgeon: Dolores Patty, MD;  Location: [facility] ENDOSCOPY;  Service: Cardiovascular;  Laterality: N/A;    There were no vitals filed for this visit.    Subjective Assessment - 03/30/21 1404     Subjective atient had woken up at 4:30 AM to let his dog out and at that time felt in his normal state of health.  After laying back down in bed he felt extremely dizzy, nauseous, and had this loud ringing in his head.  Thereafter, developed numbness sensation face all the way down to his feet on the right side of his body.  The symptoms of dizziness, nausea, and numbness symptoms are similar to his prior stroke in 03/2017 which affected the left side of his body.  Patient reports that ever since he has been on anticoagulation and took Xarelto yesterday at around 5:30 PM.  He denies missing any doses of his medication.  He had recently gone to his PCP on 11/22 and had tested positive for COVID-19.  He still reports having a mild intermittently productive cough. Pt  presents with findings of acute infarcts in cerebellum, L SCA territory affected but also L posterior cerebellum and R SCA territory.  Firststroke in 2018 affected his double vision and balance issue. double vision has been resolved after 1-2 months.    Pertinent History medical history significant of CVA 2x, hyperlipidemia with elevated lipoprotein a, and anxiety                [facility] PT Assessment - 03/30/21 1408       Assessment   Medical Diagnosis CVA    Referring Provider (PT) Dr. David Stall    Next MD Visit Dr. Pearlean Brownie 12/14    Prior Therapy 03/22/21      Precautions   Precautions None      Restrictions   Weight Bearing Restrictions No      Balance Screen   Has the patient fallen in the past 6 months No      Home Environment   Living Environment Private residence    Living Arrangements Spouse/significant other;Children    Available Help at Discharge Family    Type of Home House    Home Layout Two level    Home Equipment None      Prior Function   Level of Independence Independent      Standardized Balance Assessment   Standardized Balance Assessment Five Times Sit to  Stand    Five times sit to stand comments  9      High Level Balance   High Level Balance Comments SLS (R= 22 sec, L = 30 sec); modified CTSIB: 120/120 sec with moderate sway in position 4      Functional Gait  Assessment   Gait assessed  Yes    Gait Level Surface Walks 20 ft in less than 5.5 sec, no assistive devices, good speed, no evidence for imbalance, normal gait pattern, deviates no more than 6 in outside of the 12 in walkway width.    Change in Gait Speed Able to smoothly change walking speed without loss of balance or gait deviation. Deviate no more than 6 in outside of the 12 in walkway width.    Gait with Horizontal Head Turns Performs head turns smoothly with no change in gait. Deviates no more than 6 in outside 12 in walkway width    Gait with Vertical Head Turns Performs head turns with  no change in gait. Deviates no more than 6 in outside 12 in walkway width.    Gait and Pivot Turn Pivot turns safely within 3 sec and stops quickly with no loss of balance.    Step Over Obstacle Is able to step over 2 stacked shoe boxes taped together (9 in total height) without changing gait speed. No evidence of imbalance.    Gait with Narrow Base of Support Is able to ambulate for 10 steps heel to toe with no staggering.    Gait with Eyes Closed Walks 20 ft, no assistive devices, good speed, no evidence of imbalance, normal gait pattern, deviates no more than 6 in outside 12 in walkway width. Ambulates 20 ft in less than 7 sec.    Ambulating Backwards Walks 20 ft, no assistive devices, good speed, no evidence for imbalance, normal gait    Steps Alternating feet, no rail.    Total Score 30    FGA comment: 30/30              Following exercises reviewed with patient for HEP Tandem walking: fwd and BWD Corner standing balance: On foam, narrow BOS, tandem balance, SLS SLS on floor          Objective measurements completed on examination: See above findings.                             Plan - 03/30/21 1433     Clinical Impression Statement Patient is a 45 y.o. male who was seen today for phyiscal therapy evaluation and treamtent for gait and mobility disorder after recent stroke. Patient has history or CVA in 2018. Patient currently demonstrated [facility] of functional strength, static and dynamic balance and gait without any significant concerns. Patient is currently ambulating without any AD and I with all aspects of ADLs, transfers, and self care. Patient doesn't need skilled physical therapy. Patient was given home exercise program to continue to improve static and dynamic balance. Patient will be discharged from skilled PT.    Personal Factors and Comorbidities Comorbidity 2    Comorbidities medical history significant of CVA, hyperlipidemia with elevated  lipoprotein a, and anxiety    Stability/Clinical Decision Making Stable/Uncomplicated    Clinical Decision Making Low    Rehab Potential Excellent    PT Treatment/Interventions Patient/family education    PT Next Visit Plan Discharge from OP PT    PT Home Exercise Plan Access Code [email or web address]  Consulted and Agree with Plan of Care Patient             Patient will benefit from skilled therapeutic intervention in order to improve the following deficits and impairments:     Visit Diagnosis: Other abnormalities of gait and mobility  Muscle weakness (generalized)     Problem List Patient Active Problem List   Diagnosis Date Noted   Dissection, 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   Alcohol abuse 03/22/2021   History of CVA (cerebrovascular accident) 03/22/2021   Dizziness 05/29/2017   AKI (acute kidney injury) (HCC)    Blood glucose elevated    Generalized anxiety disorder    Transaminitis    Acute ischemic stroke (HCC) 04/17/2017   Ataxia due to recent stroke    Altered sensation due to recent stroke    Anxiety state    Attention deficit disorder    Hypokalemia    Stroke, Wallenberg's syndrome    Stroke (HCC) 04/11/2017   Hyperlipidemia 04/11/2017   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

## 2021-04-04 ENCOUNTER — Ambulatory Visit (INDEPENDENT_AMBULATORY_CARE_PROVIDER_SITE_OTHER): Payer: BC Managed Care – PPO | Admitting: Neurology

## 2021-04-04 ENCOUNTER — Encounter: Payer: Self-pay | Admitting: Neurology

## 2021-04-04 VITALS — BP 132/87 | HR 83 | Ht 70.0 in | Wt 195.5 lb

## 2021-04-04 DIAGNOSIS — I7774 Dissection of vertebral artery: Secondary | ICD-10-CM

## 2021-04-04 DIAGNOSIS — I639 Cerebral infarction, unspecified: Secondary | ICD-10-CM

## 2021-04-04 NOTE — Patient Instructions (Addendum)
I had a long d/w patient about his recent  cerebellar strokes, left vertebral artery dissection,, risk for recurrent stroke/TIAs, personally independently reviewed imaging studies and stroke evaluation results and answered questions.Continue Xarelto (rivaroxaban) daily  and aspirin 81 mg for secondary stroke prevention for 3 months and then stop aspirin and maintain strict control of hypertension with blood pressure goal below 130/90, diabetes with hemoglobin A1c goal below 6.5% and lipids with LDL cholesterol goal below 70 mg/dL. I also advised the patient to eat a healthy diet with plenty of whole grains, cereals, fruits and vegetables, exercise regularly and maintain ideal body weight .he has an upcoming appointment at [facility] neurology for second opinion recommend he continue that.  He needs follow-up CT angiogram in 2 to 3 months to look for interval recanalization of the vertebral artery.  He was advised to avoid any heavy exercises which involve straining of the neck.  He was advised to discuss with his cardiologist the need to continue Xarelto beyond 3 months.  Interestingly he had COVID infection 2 weeks prior to the stroke and wondered if that had anything to do with this.  Followup in the future with me in 6 months or call earlier if necessary. Vertebral Artery Dissection Vertebral artery dissection is a tear in a vertebral artery. The vertebral arteries are major blood vessels at the base of the neck. They carry blood from the heart to the brain. When an artery tears, blood collects inside the layers of the artery wall. This can cause a blood clot. This condition increases the risk for stroke if it is not diagnosed and treated right away. It is a common cause of stroke in people who are 39-19 years old. What are the causes? This condition may be caused by: A neck injury due to sudden or too much neck movement. Having weak blood vessel walls. The walls may tear even when no injury occurs  (spontaneous dissection). What increases the risk? The following factors may make you more likely to develop this condition: High blood pressure (hypertension). Migraines. Inherited diseases that affect the strength or shape of the blood vessels. What are the signs or symptoms? Symptoms usually appear within days of an injury, but sometimes they may not appear for weeks or years. Common symptoms of this condition include: Stabbing, sharp pain in the head, neck, eye, or face. Dizziness. Vertigo. This is a feeling that you or things around you are moving when they are not. Double vision. Other symptoms include: Hoarse voice. Hearing loss. Hiccups. Loss of taste. Difficulty speaking. Loss of balance. Difficulty swallowing. Ear pain. Nausea and vomiting. Loss of feeling in the torso, legs, or arms. How is this diagnosed? This condition may be diagnosed based on tests, such as: CT angiogram. X-ray images of your vertebral arteries are taken. A dye makes the images clear. MRI angiogram. This is used to check the health of blood vessels. Cerebral angiogram. X-ray images of blood vessels in the brain and neck are taken. A dye makes the images clear. Doppler ultrasound. This test creates images using sound waves. It shows how well blood flows through your arteries. How is this treated? Treatment depends on the cause of your condition and on your overall health. The goal of treatment is to prevent a stroke. If you are having a stroke, it is important to get treatment quickly. Treatment may include: Blood thinners. This medicine helps to prevent blood clots. This may be given first through an IV, and then as pills  for 3-6 months. Procedures to widen a narrow blood vessel (angioplasty) or to place a mesh tube (stent) inside the blood vessel to keep it open. Surgery to repair the area. This is rarely needed. Follow these instructions at home: Medicines Take over-the-counter and prescription  medicines only as told by your health care provider. If you are taking blood thinners: Talk with your health care provider before you take any medicines that contain aspirin or NSAIDs. These medicines increase your risk for dangerous bleeding. Take your medicine exactly as told, at the same time every day. Avoid activities that could cause injury or bruising. Follow instructions about how to prevent falls. Wear a medical alert bracelet or carry a card that lists what medicines you take. Lifestyle  Work with your health care provider to control hypertension. This may include: Exercising regularly. Check with your health care provider before starting a new type of exercise. Eating a heart-healthy diet of fruits, vegetables, whole grains, and lean meats. Limit unhealthy fats and eat more healthy fats such as avocados, eggs, and oily fish. Reducing the amount of salt (sodium) that you eat to less than 1,500 mg a day. Reducing stress by doing things that you enjoy and avoiding things that cause you stress. Do not use any products that contain nicotine or tobacco, such as cigarettes, e-cigarettes, and chewing tobacco. If you need help quitting, ask your health care provider. General instructions If you drink alcohol: Limit how much you use to: 0-1 drink a day for women. 0-2 drinks a day for men. Be aware of how much alcohol is in your drink. In the [place] one drink equals one 12 oz bottle of beer (355 mL), one 5 oz glass of wine (148 mL), or one 1 oz of hard liquor (44 mL). Keep all follow-up visits as told by your health care provider. This is important. Contact a health care provider if you: Feel weak or dizzy. Have a fever. Get help right away if: You have any symptoms of a stroke. "BE FAST" is an easy way to remember the main warning signs of a stroke: B - Balance. Signs are dizziness, sudden trouble walking, or loss of balance. E - Eyes. Signs are trouble seeing or a sudden change in  vision. F - Face. Signs are sudden weakness or numbness of the face, or the face or eyelid drooping on one side. A - Arms. Signs are weakness or numbness in an arm. This happens suddenly and usually on one side of the body. S - Speech. Signs are sudden trouble speaking, slurred speech, or trouble understanding what people say. T - Time. Time to call emergency services. Write down what time symptoms started. You have other signs of a stroke, such as: A sudden, severe headache with no known cause. Nausea or vomiting. Seizure. You have other symptoms, such as: Difficulty breathing. Chest pain. These symptoms may represent a serious problem that is an emergency. Do not wait to see if the symptoms will go away. Get medical help right away. Call your local emergency services (911 in the [place] Do not drive yourself to the hospital. Summary Vertebral artery dissection is a tear in an artery that carries blood from the heart to the brain. Symptoms usually appear within days of an injury, but sometimes they may not appear for weeks or years. This condition increases the risk for stroke if it is not diagnosed and treated right away. Treatment depends on the cause of your condition and on your overall  health. The goal of treatment is to prevent a stroke. Get help right away if you have any symptoms of a stroke. This information is not intended to replace advice given to you by your health care provider. Make sure you discuss any questions you have with your health care provider. Document Revised: 01/01/2018 Document Reviewed: 01/01/2018 [employer]vier Patient Education  2022 [employer].

## 2021-04-04 NOTE — Progress Notes (Signed)
[facility] [address] [place]. [place] [address] [phone]       OFFICE FOLLOW-UP NOTE  [name] Date of Birth:  1975/11/02 Medical Record Number:  [id number]   HPI: Mr Sherfield is a present 45 year old Caucasian male who is seen today for first office follow-up visit following hospital admission for stroke in December 2018. He is accompanied and his wife. History is obtained from the patient, wife and review of electronic medical records. I have personally reviewed imaging for lemons as well as his records from [facility] and [place] in care everywhereRobert F Bagshaw is an 45 y.o. male with HLD family history  significant for blood clots, prior tobacco use presents to the emergency room after being discharged yesterday for worsening dizziness, gait imbalance nausea headache and progressive of right-sided facial numbness. She came with same symptoms yesterday that started at 8:30 in the morning. During that visit his CT head was done and patient was given meclizine. Reviewing the note is unclear if the patient was made to walk prior to discharge. Patient states that symptoms worsened yesterday at 11 PM and numbness over his face extended from nose to the right half of his face. He states that he lifted weights the night before but denies recent chiropractic manipulation, twisting of his neck. He does complain of neck pain on the left side. His sister and mother had multiple clots. His sister event extensive evaluation including at [facility] known to have higher levels of factor VIII and elevated lipoprotein A levels. He takes crestor at home, not on ASA.  Date last known well: 12.20.18.Time last known well: 8.15 am.tPA Given: outside window.NIHSS: 1.Baseline MRS 0  CT scan showed no acute abnormality. 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 Otherwise CTA of the neck and brain was unremarkable. MRI scan  of the brain showed right lateral medullary as well as tiny right cerebellar infarct. Transthoracic echo showed normal ejection fraction. Transesophageal echo showed normal cardia Embolism but Showed a Small Right to Left Shunt.Transcranial Doppler Bubble Study Was Also Positive for a Small Right to Left Shunt Only.lupus anticoagulant by LA-PTT and DRVVT negative. Anticardiolipin antibodies negative. Anti-beta-2 glycoprotein 1 antibodies negative. Homocystine 7.3. HDL 71, LDL 57. Factor V Leiden and prothrombin 20210 mutations not present. Antithrombin activity, protein C activity and protein C antigen normal. Total protein S antigen and protein S activity normal. Sedimentation rate 1. C-reactive protein negative.  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 His sister had a DVT and had age of 89 who was known to have elevated factor VIII levels. The patient's factor VIII level was normal at 146 on 04/21/2007 and on 04/21/2014 it was minimally elevated at 175 which was clinically not significant..  Prothrombin Gene and Factor V Leiden Mutations Were Negative. Lower extremity venous Dopplers were negative for DVT. Hemoglobin A1c was 5.2. LDL cholesterol was 57 mg percent. Patient was started on dual antiplatelet therapy of aspirin and Plavix after being started initially on heparin for a few days. The etiology of patient's stroke was indeterminate patient denied significant neck pain or any physical exertion to suggest dissection at that time. He agreed to proceed in the [place]. Feels transferred to inpatient rehabilitation but did well and is currently at all. He is subsequently had a second neurological opinion at [facility] neurology with Dr. Nancy Fetter as well  as hematologist' Dr Joan Flores for family h/o elevated  factor 8 level. Patient was continued on aspirin and Plavix and not commended long-term decompression. Patient states his balance and gait have improved his has  some intermittent numbness in the left side of the face as well as some transient vertigo. He in fact was readmitted on 05/29/16 because of transient positional vertigo which resolved shortly after admission. This was felt to be peripheral positional vertigo. 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 Patient was seen by physical therapist advised him to vascular stabilization exercises as well as continue aspirin and Plavix and Crestor. Patient states his done well since discharge. No longer feeling dizzy. His been complaining of posterior neck pain and headaches and physical therapies have started dry needling is helping. He had a outpatient heart monitor done at [facility] on 05/19/17 which did not show any evidence of paroxysmal atrial fibrillation. He has in psychiatrist Dr. Toy Care for his anxiety and ADHD and she is prescribed Celexa but he has not started it yet. The patient has started going back to work but states that he gets tired easily. Is planning a trip to the [place] and he wants to start exercising and increasing his physical activity gradually. Is tolerating aspirin and Plavix without bruising or bleeding. Is tolerating vascular muscle aches and pains. His blood pressure well controlled. It is 120/76. His considering starting PCS 9 iinhibotor injections for his elevated lipoprotein a and family history of premature coronary artery disease and his insurance has denied this and he will try to get samples from his cardiologist    Update 12/16/2017 : He returns follow-up after last visit 6 months ago. He continues to do well from the stroke standpoint without recurrent stroke or TIA symptoms. He still has occasional numbness on his face and body but it is not bothersome. He has been started on eliquis by his cardiologist from [facility] for elevated lipoprotein.a  He also seems to be  taking a baby aspirin. Hishas not had any major bruising or bleeding episodes. He states his gained some weight and plans to exercise and lose it off. He states his posterior neck pain and headaches are much better. He was initially started on Zoloft by his primary physician which helped but he had some GI issues and more recently has been switched to [place]. His blood pressure is well controlled. He is tolerating Crestor well without any side effects. His insurance company denied Repatha injections for his elevated lipoprotein.  Update 10/19/2019 : He returns for follow-up after last visit with me nearly 2 years ago.  He is doing well and has not had recurrent stroke or TIA symptoms.  He was concerned as recently started some worsening of his post stroke paresthesias on the right face as well as some in the left hand.  Occasionally is also having intermittent tingling of his fingertips.  He does admit to significant increasing stress recently because of his work as well as starting a new diet as well as exercise program.  Is also had a medication change.  He has been participating in the new lipoprotein a trial with Dr. Einar Gip being done by [employer].  His platelet count has started going down which has been watched carefully.  He was previously on Eliquis started by his cardiologist at [facility] but used to forget the second dose hence he was switched  to Xarelto which she takes in the morning with a protein shake only.  Starting it fairly well with only minor bruising and no bleeding.  He has also been switched to Repatha and Crestor dose has been reduced from 40 mg to 5 mg daily.  His last lipid profile had shown improvement in his lipoprotein a levels as well as lipid profile but it is still not in the normal range.  He had one episode of panic attack recently.  He had obtained substantial benefit in the past with cognitive behavioral therapy and he plans to call his therapist and see him soon. Update 04/04/2021 ;  Patient is worked into the schedule emergently as he was recently hospitalized with another stroke.  He presented on 03/22/2021 when he woke up in the morning to let his dog out he rolled over on his side and noticed a sudden ringing sound in his ears.  He also noticed numbness in the right side of the body.  His wife called EMS and by the time they arrived he noticed right lower facial weakness and slurred speech.  Code stroke was called.  CT head on admission was unremarkable CT angiogram showed left vertebral artery dissection in the V3 segment with near occlusion with preserved flow distally.  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  Right V3 segment was patent.  Patient was already on Xarelto hence was not considered for thrombolysis.  He was put on IV heparin and treated conservatively and did well and remained stable.  Aspirin was added and he was placed back on Xarelto.  2D echo showed normal ejection fraction.  Lower extremity venous Doppler was negative for DVT.  LDL cholesterol 59 mg percent.  Hemoglobin A1c was 5.2.  Did well and was discharged with outpatient therapies.  He states he recovered back to his baseline.  He has mild right face and left body paresthesias which is residual from his previous stroke.  He has no residual deficits from the current stroke.  Patient has since called up his friend who is a vascular neurologist at [facility] [place] and has been referred to [facility] for second opinion and is already spoken to them and scheduled to follow-up with Dr. Cloria Spring February/ March next year.  The patient's previous stroke in February 2019 had also shown irregularity of the right vertebral artery in the V3 segment with reduction in caliber raising possibility of dissection. ROS:   14 system review of systems is positive for  numbness, tingling, bruising and all other systems negative  PMH:  Past Medical History:  Diagnosis Date   Anxiety     CVA (cerebral vascular accident) (HCC)    HLD (hyperlipidemia)     Social History:  Social History   Socioeconomic History   Marital status: Single    Spouse name: Not on file   Number of children: Not on file   Years of education: Not on file   Highest education level: Not on file  Occupational History   Not on file  Tobacco Use   Smoking status: Former    Years: 20.00    Types: Cigarettes    Quit date: 01/15/2016    Years since quitting: 5.2   Smokeless tobacco: Former    Types: Associate Professor Use: Never used  Substance and Sexual Activity   Alcohol use: Yes    Alcohol/week: 10.0 standard drinks    Types: 10 Cans  of beer per week    Comment: social 10  beers per week   Drug use: No   Sexual activity: Yes  Other Topics Concern   Not on file  Social History Narrative   Not on file   Social Determinants of Health   Financial Resource Strain: Not on file  Food Insecurity: Not on file  Transportation Needs: Not on file  Physical Activity: Not on file  Stress: Not on file  Social Connections: Not on file  Intimate Partner Violence: Not on file    Medications:   Current Outpatient Medications on File Prior to Visit  Medication Sig Dispense Refill   aspirin EC 81 MG EC tablet Take 1 tablet (81 mg total) by mouth daily. Swallow whole. 30 tablet 11   FLUoxetine (PROZAC) 10 MG capsule Take 10 mg by mouth every evening.     NON FORMULARY Inject 1 Dose as directed every 30 (thirty) days. Study drug     REPATHA SURECLICK XX123456 MG/ML SOAJ INJECT 1 PEN INTO SKIN EVERY 14 DAYS (Patient taking differently: Inject 140 mg into the skin every 14 (fourteen) days.) 2 mL 11   rivaroxaban (XARELTO) 20 MG TABS tablet Take 1 tablet (20 mg total) by mouth daily with supper. 30 tablet 3   rosuvastatin (CRESTOR) 20 MG tablet TAKE ONE TABLET DAILY (Patient taking differently: Take 20 mg by mouth daily.) 90 tablet 3   No current facility-administered medications on file prior  to visit.    Allergies:   Allergies  Allergen Reactions   Penicillins Hives    Has patient had a PCN reaction causing immediate rash, facial/tongue/throat swelling, SOB or lightheadedness with hypotension: YES Has patient had a PCN reaction causing severe rash involving mucus membranes or skin necrosis: NO Has patient had a PCN reaction that required hospitalizationNO Has patient had a PCN reaction occurring within the last 10 years: NO If all of the above answers are "NO", then may proceed with Cephalosporin use. Has patient had a PCN reaction causing immediate rash, facial/tongue/throat swelling, SOB or lightheadedness with hypotension: YES Has patient had a PCN reaction causing severe rash involving mucus membranes or skin necrosis: NO Has patient had a PCN reaction that required hospitalizationNO Has patient had a PCN reaction occurring within the last 10 years: NO If all of the above answers are "NO", then may proceed with Cephalosporin use.   Propofol     Increased blood pressure and heart rate    Physical Exam General: well developed, well nourished young Caucasian male, seated, in no evident distress.  Appears mildly anxious. Head: head normocephalic and atraumatic.  Neck: supple with no carotid or supraclavicular bruits Cardiovascular: regular rate and rhythm, no murmurs Musculoskeletal: no deformity Skin:  no rash/petichiae Vascular:  Normal pulses all extremities Vitals:   04/04/21 1526  BP: 132/87  Pulse: 83   Neurologic Exam Mental Status: Awake and fully alert. Oriented to place and time. Recent and remote memory intact. Attention span, concentration and fund of knowledge appropriate. Mood and affect appropriate.  Cranial Nerves: Fundoscopic exam not done. Pupils equal, briskly reactive to light. Extraocular movements full without nystagmus but mild saccadic dysmetria on left lateral gaze.. Visual fields full to confrontation. Hearing intact. Facial sensation  diminished on the left side Face, tongue, palate moves normally and symmetrically.  Motor: Normal bulk and tone. Normal strength in all tested extremity muscles. Sensory.: intact to touch ,pinprick .position and vibratory sensation except mild hyperesthesia over the right lower face. and  left body.  Coordination: Rapid alternating movements normal in all extremities. Finger-to-nose and heel-to-shin performed accurately bilaterally. Gait and Station: Arises from chair without difficulty. Stance is normal. Gait demonstrates normal stride length and balance . Able to heel, toe and tandem walk with slight difficulty.  Reflexes: 1+ and symmetric. Toes downgoing.   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 Vascular risk factors of small PFO and hyperlipidemia only. His small PFO is likely a innocent bystander Family history of elevated lipoprotein a.  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 I also advised the patient to eat a healthy diet with plenty of whole grains, cereals, fruits and vegetables, exercise regularly and maintain ideal body weight .he has an  upcoming appointment at [facility] neurology for second opinion and work-up for connective tissue disorders and recommend he continue that.  He needs follow-up CT angiogram in 2 to 3 months to look for interval recanalization of the vertebral artery.  He was advised to avoid any heavy exercises which involve straining of the neck.  He was advised to discuss with his cardiologist the need to continue Xarelto beyond 3 months.  Interestingly he had COVID infection 2 weeks prior to the stroke and wondered if that had anything to do with this.  Followup in the future with me in 6 months or call earlier if necessary. Greater than 50% of time during this 30 minute visit was spent on counseling,explanation of diagnosis vertebral artery occlusion, brainstem stroke, paresthesias and tingling, planning of further management, discussion with patient and family and coordination of care Antony Contras, MD  [facility] [address] [place], [place] [address]  Phone [phone] Fax [phone] Note: This document was prepared with digital dictation and possible smart phrase technology. Any transcriptional errors that result from this process are unintentional

## 2021-04-05 ENCOUNTER — Encounter: Payer: Self-pay | Admitting: Internal Medicine

## 2021-04-09 ENCOUNTER — Encounter: Payer: Self-pay | Admitting: Neurology

## 2021-04-09 NOTE — Telephone Encounter (Signed)
Hx of CVA x 2. Recent distal left vertebral artery dissection on 03/22/21.  I called the patient for more detailed information. He has two concerns he would like addressed:  1) Development of a constant, low ringing in left ear (confirmed he is taking aspirin 81mg  daily and Xarelto 20mg  daily).   2) New onset of mid and left-sided neck discomfort when coughing, sneezing or yawning. Describes as a throbbing pain during these activities.  Symptoms present for about two days. He is concerned he may need to have further testing.  He is aware that Dr. is out of the office this week and our work-in MD will need to review. He would like a call back with a plan.

## 2021-04-09 NOTE — Telephone Encounter (Signed)
I returned the call to the patient. He verbalized understanding of the plan and was in agreement.

## 2021-04-25 NOTE — Telephone Encounter (Signed)
I spoke to the patient and provided Dr. Marlis Edelson response. He verbalized understanding and will move forward with his ordered tests.

## 2021-04-27 DIAGNOSIS — D5521 Anemia due to pyruvate kinase deficiency: Secondary | ICD-10-CM | POA: Diagnosis not present

## 2021-05-23 DIAGNOSIS — I7774 Dissection of vertebral artery: Secondary | ICD-10-CM | POA: Diagnosis not present

## 2021-05-23 DIAGNOSIS — Z23 Encounter for immunization: Secondary | ICD-10-CM | POA: Diagnosis not present

## 2021-06-28 ENCOUNTER — Telehealth: Payer: Self-pay | Admitting: Neurology

## 2021-06-28 ENCOUNTER — Ambulatory Visit (INDEPENDENT_AMBULATORY_CARE_PROVIDER_SITE_OTHER): Payer: BC Managed Care – PPO | Admitting: Neurology

## 2021-06-28 ENCOUNTER — Encounter: Payer: Self-pay | Admitting: Neurology

## 2021-06-28 VITALS — BP 144/96 | HR 88 | Ht 70.0 in | Wt 200.0 lb

## 2021-06-28 DIAGNOSIS — G4733 Obstructive sleep apnea (adult) (pediatric): Secondary | ICD-10-CM

## 2021-06-28 DIAGNOSIS — Z8673 Personal history of transient ischemic attack (TIA), and cerebral infarction without residual deficits: Secondary | ICD-10-CM | POA: Diagnosis not present

## 2021-06-28 DIAGNOSIS — G4719 Other hypersomnia: Secondary | ICD-10-CM | POA: Diagnosis not present

## 2021-06-28 DIAGNOSIS — I7774 Dissection of vertebral artery: Secondary | ICD-10-CM

## 2021-06-28 NOTE — Patient Instructions (Signed)
We will set you up at home with a new autoPAP machine for treatment of your obstructive sleep apnea. Hopefully, we can use the data from your home sleep test from January 2021. ?If your insurance requires retesting, we can proceed with a home sleep test.  We will call you to schedule this. ?Once you have your machine, please use your autoPAP regularly. While your insurance requires that you use PAP at least 4 hours each night on 70% of the nights, I recommend, that you not skip any nights and use it throughout the night if you can. Getting used to PAP and staying with the treatment long term does take time and patience and discipline. Untreated obstructive sleep apnea when it is moderate to severe can have an adverse impact on cardiovascular health and raise her risk for heart disease, arrhythmias, hypertension, congestive heart failure, stroke and diabetes. Untreated obstructive sleep apnea causes sleep disruption, nonrestorative sleep, and sleep deprivation. This can have an impact on your day to day functioning and cause daytime sleepiness and impairment of cognitive function, memory loss, mood disturbance, and problems focussing. Using PAP regularly can improve these symptoms. ?We will need to make a follow-up appointment within 1 to 3 months after set up with your new AutoPap machine. ? ? ?

## 2021-06-28 NOTE — Telephone Encounter (Signed)
I called the patient.  He states he received the CPAP that we had ordered previously (last seen in 2020) and 2 days later he got COVID.  He felt like he got COVID from the machine.  He started feeling better but then stopped using the machine because he was hesitant.  He ended up having to return the machine due to noncompliance and he never followed up with our office specifically for the sleep apnea.  He has continued to see Dr. Pearlean Brownie, last saw him in December 2022.  He states since he saw Dr. Frances Furbish he had a second stroke.  A provider recommended that he should be on CPAP so he is willing to restart.  We scheduled him for an appointment today with Dr. Frances Furbish at 245 as we had a cancellation.  We also discussed some of the compliance requirements so he knows ahead of time.  Patient verbalized understanding and appreciation for the call. ?

## 2021-06-28 NOTE — Telephone Encounter (Signed)
Pt called wanting to know what he has to do in order for him to get another cpap machine due to his apnea getting worse. Please advise. ?

## 2021-06-28 NOTE — Progress Notes (Signed)
Subjective:    Patient ID: Jake Moore is a 46 y.o. male.  HPI    Star Age, MD, PhD [facility] [address]O. [place], [place] [address]  Jake Moore is a 46 year old right-handed gentleman with an underlying medical history of stroke in 2018 and 03/2021, L vertebral artery dissection (followed by Dr. Leonie Man), hyperlipidemia, anxiety, hypothyroidism, PFO and overweight state, who presents for follow up consultation of his OSA. The patient is unaccompanied today and presents after a long gap of over 2 years.  I first met him at the request of his primary care physician on 04/12/2019, at which time he reported snoring and daytime somnolence as well as witnessed apneas.    He had a home sleep test on 04/28/2019 which showed severe obstructive sleep apnea (by number of events) with a total AHI of 54.8/hour and O2 nadir of 88%.  The patient was advised to start AutoPap therapy.  He tried the machine for a few days but after he developed COVID, he did not go back to using his AutoPap and eventually lost insurance coverage for it.  He did not follow-up in sleep clinic after that.  Today, 06/28/2021: He reports that he has had more snoring and witnessed apneas and gasping sensation since he has been sleeping on his back since his last stroke and left vertebral dissection.  He is scheduled to go to the [facility] for further evaluation because of a strong family history of cardiovascular disease at a younger age.  He got his AutoPap machine in early 2021 and shortly after he started using it he came down with a COVID infection and after he recuperated he never got back into the habit of using his AutoPap.  He lost insurance coverage for the machine and gave it back around May 2021.  He is interested in restarting his AutoPap.  He has had some weight fluctuation.  He is currently working on weight loss.  He lives with his family, he has caffeine in the form of  diet soda, 2 to 3 cans/day, alcohol about 2 glasses of wine per week.  He quit smoking in 2017.  Bedtime is generally around 10, rise time around 7.  His Epworth sleepiness score is 15 out of 24, fatigue severity score is 24 out of 63.  He does not have night to night nocturia or recurrent morning headaches.  Previously:   04/12/19: (He) reports snoring and excessive daytime somnolence, weight gain, and apneic breathing pauses while asleep.  I reviewed your telemedicine note from 03/04/2019. His blood work through your office on 02/25/2019 showed a mildly elevated TSH at 4.76.  His Epworth sleepiness score is 18 out of 24, fatigue severity score is 20 out of 63.  His wife has noted apneic pauses while he is asleep.  He has had weight gain since his stroke in 2018, in the realm of 40 pounds.  He is currently working with a Physiological scientist.  He is also working on his diet.  He is currently on a gluten-free diet.  He quit drinking alcohol, was never a heavy drinker.  He quit smoking about 5 years ago was an intermittent smoker and also more regular smoker back in college.  His father has a diagnosis of sleep apnea but no longer uses a CPAP machine.  He has a family history of vascular disease.  He denies recurrent morning headaches or night to night nocturia.  He is typically in  bed by 11 and rise time is around 7.  He has a TV in the bedroom but turns it off at night.  He has 1 dog in the household, the dog typically sleeps on the bed with them.  He lives with his wife and 3 children ages 37, 14 and 28.  He works in Product manager.  He has paresthesias in the left hemibody since the stroke but otherwise recuperated well, balance and coordination improved.   His Past Medical History Is Significant For: Past Medical History:  Diagnosis Date   Anxiety    CVA (cerebral vascular accident) ([place])    History of multiple strokes    HLD (hyperlipidemia)     His Past Surgical History Is Significant  For: Past Surgical History:  Procedure Laterality Date   TEE WITHOUT CARDIOVERSION N/A 04/14/2017   Procedure: TRANSESOPHAGEAL ECHOCARDIOGRAM (TEE);  Surgeon: Jolaine Artist, MD;  Location: [facility] ENDOSCOPY;  Service: Cardiovascular;  Laterality: N/A;    His Family History Is Significant For: Family History  Problem Relation Age of Onset   Other Sister        hypercoagulable state with SCAD   CAD Maternal Grandfather    Sleep apnea Father     His Social History Is Significant For: Social History   Socioeconomic History   Marital status: Single    Spouse name: Not on file   Number of children: Not on file   Years of education: Not on file   Highest education level: Not on file  Occupational History   Not on file  Tobacco Use   Smoking status: Former    Years: 20.00    Types: Cigarettes    Quit date: 01/15/2016    Years since quitting: 5.4   Smokeless tobacco: Former    Types: Nurse, children's Use: Never used  Substance and Sexual Activity   Alcohol use: Yes    Alcohol/week: 7.0 standard drinks    Types: 7 Glasses of wine per week    Comment: social 10  beers per week   Drug use: No   Sexual activity: Yes  Other Topics Concern   Not on file  Social History Narrative   Not on file   Social Determinants of Health   Financial Resource Strain: Not on file  Food Insecurity: Not on file  Transportation Needs: Not on file  Physical Activity: Not on file  Stress: Not on file  Social Connections: Not on file    His Allergies Are:  Allergies  Allergen Reactions   Penicillins Hives    Has patient had a PCN reaction causing immediate rash, facial/tongue/throat swelling, SOB or lightheadedness with hypotension: YES Has patient had a PCN reaction causing severe rash involving mucus membranes or skin necrosis: NO Has patient had a PCN reaction that required hospitalizationNO Has patient had a PCN reaction occurring within the last 10 years: NO If all of the  above answers are "NO", then may proceed with Cephalosporin use. Has patient had a PCN reaction causing immediate rash, facial/tongue/throat swelling, SOB or lightheadedness with hypotension: YES Has patient had a PCN reaction causing severe rash involving mucus membranes or skin necrosis: NO Has patient had a PCN reaction that required hospitalizationNO Has patient had a PCN reaction occurring within the last 10 years: NO If all of the above answers are "NO", then may proceed with Cephalosporin use.   Propofol     Increased blood pressure and heart rate  :  His Current Medications Are:  Outpatient Encounter Medications as of 06/28/2021  Medication Sig   aspirin EC 81 MG EC tablet Take 1 tablet (81 mg total) by mouth daily. Swallow whole.   FLUoxetine (PROZAC) 10 MG capsule Take 10 mg by mouth every evening.   NON FORMULARY Inject 1 Dose as directed every 30 (thirty) days. Study drug   REPATHA SURECLICK 749 MG/ML SOAJ INJECT 1 PEN INTO SKIN EVERY 14 DAYS (Patient taking differently: Inject 140 mg into the skin every 14 (fourteen) days.)   rivaroxaban (XARELTO) 20 MG TABS tablet Take 1 tablet (20 mg total) by mouth daily with supper.   rosuvastatin (CRESTOR) 20 MG tablet TAKE ONE TABLET DAILY (Patient taking differently: Take 20 mg by mouth daily.)   No facility-administered encounter medications on file as of 06/28/2021.  :   Review of Systems:  Out of a complete 14 point review of systems, all are reviewed and negative with the exception of these symptoms as listed below:   Review of Systems  Neurological:        Pt in 9 Pt states he wants to restart CPAP machine . Pt states he had sleep study and CPAP machine May 2021 . Pt states he has had 2 strokes , snores,fatigue, and headaches   ESS:15 FSS:24   Objective:  Neurological Exam  Physical Exam Physical Examination:   Vitals:   06/28/21 1448  BP: (!) 144/96  Pulse: 88    General Examination: The patient is a very pleasant  46 y.o. male in no acute distress. He appears well-developed and well-nourished and well groomed.   HEENT: Normocephalic, atraumatic, pupils are equal, round and reactive to light, extraocular tracking is well-preserved, face is symmetric with normal facial animation, speech is clear without dysarthria, hypophonia or voice tremor.  Neck with decreased range of motion actively.  No carotid bruits.  Airway examination reveals mild mouth dryness, adequate dental hygiene, mild airway crowding due to a small airway, tonsils on the smaller side.  Mallampati class I, neck circumference 16 three-quarter inches.   Tongue protrudes centrally and palate elevates symmetrically.  Chest: Clear to auscultation without wheezing, rhonchi or crackles noted.   Heart: S1+S2+0, regular and normal without murmurs, rubs or gallops noted.    Abdomen: Soft, non-tender and non-distended.   Extremities: There is no pitting edema in the distal lower extremities bilaterally.    Skin: Warm and dry without trophic changes noted.    Musculoskeletal: exam reveals no obvious joint deformities.    Neurologically:  Mental status: The patient is awake, alert and oriented in all 4 spheres. His immediate and remote memory, attention, language skills and fund of knowledge are appropriate. There is no evidence of aphasia, agnosia, apraxia or anomia. Speech is clear with normal prosody and enunciation. Thought process is linear. Mood is normal and affect is normal.  Cranial nerves II - XII are as described above under HEENT exam.  Motor exam: Normal bulk, strength and tone is noted. There is no tremor. Fine motor skills and coordination: grossly intact.  Cerebellar testing: No dysmetria or intention tremor. Finger-to-nose is good bilaterally. There is no truncal or gait ataxia.  Sensory exam: intact to light touch in the upper and lower extremities.  Gait, station and balance: He stands easily. No veering to one side is noted. No  leaning to one side is noted. Posture is age-appropriate and stance is narrow based. Gait shows normal stride length and normal pace. No problems turning are noted.  Assessment and Plan:  In summary, EULISES KIJOWSKI is a very pleasant 46 year old right-handed gentleman with an underlying medical history of stroke in 2018 and 03/2021, L vertebral artery dissection (followed by Dr. Leonie Man), hyperlipidemia, anxiety, hypothyroidism, PFO and overweight state, who presents for follow up consultation of his OSA.  Home sleep testing in January 2021 showed obstructive sleep apnea in the severe range by AHI criteria, O2 nadir was 88% at the time.  He was not able to be compliant with AutoPap therapy at the time, would like to get restarted on treatment.  He is advised to look out for a phone call from his DME company.  I will send a new AutoPap order to his previous DME provider and he is advised to try to be consistent with AutoPap therapy, he is advised regarding the risks and ramifications of untreated obstructive sleep apnea in the moderate to severe range, in particular with regards to increase in cardiovascular disease risk.  He is encouraged to be fully compliant with treatment, we will plan a follow-up within 1 to 3 months after AutoPap set up.   I answered all his questions today and the patient was in agreement.  I spent 40 minutes in total face-to-face time and in reviewing records during pre-charting, more than 50% of which was spent in counseling and coordination of care, reviewing test results, reviewing medications and treatment regimen and/or in discussing or reviewing the diagnosis of OSA, the prognosis and treatment options. Pertinent laboratory and imaging test results that were available during this visit with the patient were reviewed by me and considered in my medical decision making (see chart for details).

## 2021-06-28 NOTE — Telephone Encounter (Signed)
Noted, thank you

## 2021-06-28 NOTE — Progress Notes (Signed)
Order for new autopap sent to [facility].  ?

## 2021-07-02 ENCOUNTER — Encounter: Payer: Self-pay | Admitting: Neurology

## 2021-07-19 DIAGNOSIS — G4733 Obstructive sleep apnea (adult) (pediatric): Secondary | ICD-10-CM | POA: Diagnosis not present

## 2021-07-23 DIAGNOSIS — Z8679 Personal history of other diseases of the circulatory system: Secondary | ICD-10-CM | POA: Diagnosis not present

## 2021-07-23 DIAGNOSIS — Z8673 Personal history of transient ischemic attack (TIA), and cerebral infarction without residual deficits: Secondary | ICD-10-CM | POA: Diagnosis not present

## 2021-07-23 DIAGNOSIS — E785 Hyperlipidemia, unspecified: Secondary | ICD-10-CM | POA: Diagnosis not present

## 2021-07-24 ENCOUNTER — Telehealth: Payer: Self-pay | Admitting: Neurology

## 2021-07-24 DIAGNOSIS — E785 Hyperlipidemia, unspecified: Secondary | ICD-10-CM | POA: Diagnosis not present

## 2021-07-24 DIAGNOSIS — I7774 Dissection of vertebral artery: Secondary | ICD-10-CM | POA: Diagnosis not present

## 2021-07-24 DIAGNOSIS — Z8679 Personal history of other diseases of the circulatory system: Secondary | ICD-10-CM | POA: Diagnosis not present

## 2021-07-24 DIAGNOSIS — E7841 Elevated Lipoprotein(a): Secondary | ICD-10-CM | POA: Diagnosis not present

## 2021-07-24 DIAGNOSIS — Z8673 Personal history of transient ischemic attack (TIA), and cerebral infarction without residual deficits: Secondary | ICD-10-CM | POA: Diagnosis not present

## 2021-07-24 NOTE — Telephone Encounter (Signed)
Pt was scheduled for his initial CPAP on 10-03-21 ?Pt was informed to bring machine and power cord to the appointemnt ?DME: [facility] ?[phone] option 1 ?F: [phone]  ? ?DME: [employer] ?Phone: [phone], press option 1 ?Fax: [phone]  ?Setup 07/19/21 (appt needed 08/19/21-10/17/21) ? ?

## 2021-07-25 DIAGNOSIS — I7774 Dissection of vertebral artery: Secondary | ICD-10-CM | POA: Diagnosis not present

## 2021-07-26 ENCOUNTER — Encounter: Payer: BC Managed Care – PPO | Admitting: Cardiology

## 2021-07-30 ENCOUNTER — Encounter: Payer: BC Managed Care – PPO | Admitting: Cardiology

## 2021-08-02 DIAGNOSIS — I7774 Dissection of vertebral artery: Secondary | ICD-10-CM | POA: Diagnosis not present

## 2021-08-09 DIAGNOSIS — R030 Elevated blood-pressure reading, without diagnosis of hypertension: Secondary | ICD-10-CM | POA: Diagnosis not present

## 2021-08-10 DIAGNOSIS — R030 Elevated blood-pressure reading, without diagnosis of hypertension: Secondary | ICD-10-CM | POA: Diagnosis not present

## 2021-08-10 DIAGNOSIS — F411 Generalized anxiety disorder: Secondary | ICD-10-CM | POA: Diagnosis not present

## 2021-08-13 DIAGNOSIS — R030 Elevated blood-pressure reading, without diagnosis of hypertension: Secondary | ICD-10-CM | POA: Diagnosis not present

## 2021-08-19 DIAGNOSIS — G4733 Obstructive sleep apnea (adult) (pediatric): Secondary | ICD-10-CM | POA: Diagnosis not present

## 2021-09-11 ENCOUNTER — Telehealth: Payer: Self-pay | Admitting: Pharmacist

## 2021-09-11 NOTE — Telephone Encounter (Signed)
Jake Moore is enrolled on the Nov[employer] Lp (a) clinical trial with pelacarsen. He has missed several study visits, is not answering our calls nor is he returning any of our calls. I understand he had a fairly recent visit to the [facility] and it was at that point we lost contact with him. Would you mind calling the patient & encouraging the patient to call us? It's fine if he wants to stop the study medication. We just need to have follow up with him by telephone at least. Thanks! Gaspar Bidding

## 2021-09-12 NOTE — Telephone Encounter (Signed)
Great. Thank you.

## 2021-09-18 DIAGNOSIS — G4733 Obstructive sleep apnea (adult) (pediatric): Secondary | ICD-10-CM | POA: Diagnosis not present

## 2021-09-18 NOTE — Telephone Encounter (Signed)
Routed to primary nurse 

## 2021-10-01 NOTE — Telephone Encounter (Signed)
As a follow up, just wanted to make you aware that we continue to be unsuccessful at reaching the patient or having him return our call regarding continued participation on the [employer] clinical trial.

## 2021-10-01 NOTE — Telephone Encounter (Signed)
As a follow up, just wanted to make you aware that we continue to be unsuccessful at reaching the patient or having him return our call regarding continued participation on the Horizon clinical trial. 

## 2021-10-03 ENCOUNTER — Ambulatory Visit: Payer: BC Managed Care – PPO | Admitting: Adult Health

## 2021-10-04 ENCOUNTER — Ambulatory Visit: Payer: BC Managed Care – PPO | Admitting: Neurology

## 2021-10-04 ENCOUNTER — Telehealth (INDEPENDENT_AMBULATORY_CARE_PROVIDER_SITE_OTHER): Payer: BC Managed Care – PPO | Admitting: Adult Health

## 2021-10-04 ENCOUNTER — Encounter: Payer: Self-pay | Admitting: Adult Health

## 2021-10-04 DIAGNOSIS — Z9989 Dependence on other enabling machines and devices: Secondary | ICD-10-CM

## 2021-10-04 DIAGNOSIS — G4733 Obstructive sleep apnea (adult) (pediatric): Secondary | ICD-10-CM | POA: Diagnosis not present

## 2021-10-04 NOTE — Progress Notes (Signed)
[facility] [address] [place]. [place] [address] [phone]       OFFICE FOLLOW UP NOTE  Jake Moore Date of Birth:  1976-03-18 Medical Record Number:  [id number]   Reason for visit: Initial CPAP follow-up   Virtual Visit via Video Note  Virtual visit completed through [facility] [employer], a video enabled telemedicine application. Due to national recommendations of social distancing due to COVID-19, a virtual visit is felt to be most appropriate for this patient at this time. Reviewed limitations, risks, security and privacy concerns of performing a virtual visit and the availability of in person appointments. I also reviewed that there may be a patient responsible charge related to this service. The patient agreed to proceed.    Patient location: home Provider location: in office, [facility] Persons participating in this virtual visit: Patient and provider     SUBJECTIVE:   CHIEF COMPLAINT:  CPAP follow-up   HPI:   Update 10/04/2021 JM: Patient is being seen via MyChart video visit for initial CPAP follow-up after prior visit on 06/28/2021 with Dr. Rexene Alberts.  He received new CPAP machine on 07/19/2021.  Review of the past 30 day compliance report as below.   Reports he has been doing well on his CPAP and tolerating it without difficulty but has been having issues with it "syncing". He feels like he has had great benefit since starting his CPAP.  Believes there has been improvement with his energy levels and sleeping better.  Continues to follow with DME company [employer].  He has no questions or concerns at this time.         History from Dr. Guadelupe Sabin prior [place] note provided for reference purposes only Jake Moore is a 46 year old right-handed gentleman with an underlying medical history of stroke in 2018 and 03/2021, L vertebral artery dissection (followed by Dr. Leonie Man), hyperlipidemia, anxiety, hypothyroidism, PFO and overweight  state, who presents for follow up consultation of his OSA. The patient is unaccompanied today and presents after a long gap of over 2 years.  I first met him at the request of his primary care physician on 04/12/2019, at which time he reported snoring and daytime somnolence as well as witnessed apneas.     He had a home sleep test on 04/28/2019 which showed severe obstructive sleep apnea (by number of events) with a total AHI of 54.8/hour and O2 nadir of 88%.  The patient was advised to start AutoPap therapy.  He tried the machine for a few days but after he developed COVID, he did not go back to using his AutoPap and eventually lost insurance coverage for it.  He did not follow-up in sleep clinic after that.   Today, 06/28/2021: He reports that he has had more snoring and witnessed apneas and gasping sensation since he has been sleeping on his back since his last stroke and left vertebral dissection.  He is scheduled to go to the [facility] for further evaluation because of a strong family history of cardiovascular disease at a younger age.  He got his AutoPap machine in early 2021 and shortly after he started using it he came down with a COVID infection and after he recuperated he never got back into the habit of using his AutoPap.  He lost insurance coverage for the machine and gave it back around May 2021.  He is interested in restarting his AutoPap.  He has had some weight fluctuation.  He is currently working on weight loss.  He lives with his family, he has caffeine in the form of diet soda, 2 to 3 cans/day, alcohol about 2 glasses of wine per week.  He quit smoking in 2017.  Bedtime is generally around 10, rise time around 7.  His Epworth sleepiness score is 15 out of 24, fatigue severity score is 24 out of 63.  He does not have night to night nocturia or recurrent morning headaches.          ROS:   14 system review of systems performed and negative with exception of those listed in HPI  PMH:   Past Medical History:  Diagnosis Date   Anxiety    CVA (cerebral vascular accident) ([place])    History of multiple strokes    HLD (hyperlipidemia)     PSH:  Past Surgical History:  Procedure Laterality Date   TEE WITHOUT CARDIOVERSION N/A 04/14/2017   Procedure: TRANSESOPHAGEAL ECHOCARDIOGRAM (TEE);  Surgeon: Jolaine Artist, MD;  Location: [facility] ENDOSCOPY;  Service: Cardiovascular;  Laterality: N/A;    Social History:  Social History   Socioeconomic History   Marital status: Single    Spouse name: Not on file   Number of children: Not on file   Years of education: Not on file   Highest education level: Not on file  Occupational History   Not on file  Tobacco Use   Smoking status: Former    Years: 20.00    Types: Cigarettes    Quit date: 01/15/2016    Years since quitting: 5.7   Smokeless tobacco: Former    Types: Nurse, children's Use: Never used  Substance and Sexual Activity   Alcohol use: Yes    Alcohol/week: 7.0 standard drinks of alcohol    Types: 7 Glasses of wine per week    Comment: social 10  beers per week   Drug use: No   Sexual activity: Yes  Other Topics Concern   Not on file  Social History Narrative   Not on file   Social Determinants of Health   Financial Resource Strain: Not on file  Food Insecurity: Not on file  Transportation Needs: Not on file  Physical Activity: Not on file  Stress: Not on file  Social Connections: Not on file  Intimate Partner Violence: Not on file    Family History:  Family History  Problem Relation Age of Onset   Other Sister        hypercoagulable state with SCAD   CAD Maternal Grandfather    Sleep apnea Father     Medications:   Current Outpatient Medications on File Prior to Visit  Medication Sig Dispense Refill   aspirin EC 81 MG EC tablet Take 1 tablet (81 mg total) by mouth daily. Swallow whole. 30 tablet 11   FLUoxetine (PROZAC) 10 MG capsule Take 10 mg by mouth every evening.     NON  FORMULARY Inject 1 Dose as directed every 30 (thirty) days. Study drug     REPATHA SURECLICK 702 MG/ML SOAJ INJECT 1 PEN INTO SKIN EVERY 14 DAYS (Patient taking differently: Inject 140 mg into the skin every 14 (fourteen) days.) 2 mL 11   rivaroxaban (XARELTO) 20 MG TABS tablet Take 1 tablet (20 mg total) by mouth daily with supper. 30 tablet 3   rosuvastatin (CRESTOR) 20 MG tablet TAKE ONE TABLET DAILY (Patient taking differently: Take 20 mg by mouth daily.) 90 tablet 3   No current facility-administered medications on file prior  to visit.    Allergies:   Allergies  Allergen Reactions   Penicillins Hives    Has patient had a PCN reaction causing immediate rash, facial/tongue/throat swelling, SOB or lightheadedness with hypotension: YES Has patient had a PCN reaction causing severe rash involving mucus membranes or skin necrosis: NO Has patient had a PCN reaction that required hospitalizationNO Has patient had a PCN reaction occurring within the last 10 years: NO If all of the above answers are "NO", then may proceed with Cephalosporin use. Has patient had a PCN reaction causing immediate rash, facial/tongue/throat swelling, SOB or lightheadedness with hypotension: YES Has patient had a PCN reaction causing severe rash involving mucus membranes or skin necrosis: NO Has patient had a PCN reaction that required hospitalizationNO Has patient had a PCN reaction occurring within the last 10 years: NO If all of the above answers are "NO", then may proceed with Cephalosporin use.   Propofol     Increased blood pressure and heart rate      OBJECTIVE:  Physical Exam General: well developed, well nourished, very pleasant middle-aged Caucasian male, seated, in no evident distress Head: head normocephalic and atraumatic.    Neurologic Exam Mental Status: Awake and fully alert. Oriented to place and time. Recent and remote memory intact. Attention span, concentration and fund of knowledge  appropriate. Mood and affect appropriate.  Cranial Nerves: Extraocular movements full without nystagmus. Hearing intact to voice. Facial sensation intact. Face, tongue, palate moves normally and symmetrically.  Shoulder shrug symmetric. Motor: No evidence of weakness per drift assessment Sensory.: intact to light touch Coordination: Rapid alternating movements normal in all extremities. Finger-to-nose and heel-to-shin performed accurately bilaterally.         ASSESSMENT/PLAN: Jake Moore is a 46 y.o. year old male    OSA on CPAP : Compliance report shows satisfactory usage with optimal residual AHI.  Discussed continued nightly usage with ensuring greater than 4 hours nightly for optimal benefit and per insurance purposes.  Continue to follow with DME company for any needed supplies or CPAP related concerns  Will reach out to [place] from today's visit is currently linked to his old machine from 2021 on [employer]. He no longer has that machine from 2021 and confirmed he has been using his new machine that he received back in March.     Follow up in 6 months or call earlier if needed   CC:  PCP: Ginger Organ., MD    I spent 21 minutes of face-to-face and non-face-to-face time with patient.  This included previsit chart review, lab review, study review, order entry, electronic health record documentation, patient education regarding diagnosis of sleep apnea with review and discussion of compliance report and answered all other questions to patient's satisfaction   Frann Rider, [facility]  [facility] [address] [place], [place] [address]  Phone [phone] Fax [phone] Note: This document was prepared with digital dictation and possible smart phrase technology. Any transcriptional errors that result from this process are unintentional.

## 2021-10-05 NOTE — Telephone Encounter (Signed)
Routed to lipid clinic scheduler about an appointment   Was routed on 09/14/21 to NL scheduling team

## 2021-10-05 NOTE — Telephone Encounter (Signed)
LVM for patient to schedule Lipid recall

## 2021-10-10 NOTE — Telephone Encounter (Signed)
LVM for patient to schedule.

## 2021-10-19 DIAGNOSIS — G4733 Obstructive sleep apnea (adult) (pediatric): Secondary | ICD-10-CM | POA: Diagnosis not present

## 2021-11-12 DIAGNOSIS — E7841 Elevated Lipoprotein(a): Secondary | ICD-10-CM | POA: Diagnosis not present

## 2021-11-18 DIAGNOSIS — G4733 Obstructive sleep apnea (adult) (pediatric): Secondary | ICD-10-CM | POA: Diagnosis not present

## 2021-11-22 ENCOUNTER — Other Ambulatory Visit: Payer: Self-pay | Admitting: Cardiology

## 2021-12-04 DIAGNOSIS — G4733 Obstructive sleep apnea (adult) (pediatric): Secondary | ICD-10-CM | POA: Diagnosis not present

## 2021-12-19 DIAGNOSIS — G4733 Obstructive sleep apnea (adult) (pediatric): Secondary | ICD-10-CM | POA: Diagnosis not present

## 2021-12-28 ENCOUNTER — Other Ambulatory Visit (HOSPITAL_BASED_OUTPATIENT_CLINIC_OR_DEPARTMENT_OTHER): Payer: Self-pay | Admitting: Internal Medicine

## 2022-01-04 DIAGNOSIS — G4733 Obstructive sleep apnea (adult) (pediatric): Secondary | ICD-10-CM | POA: Diagnosis not present

## 2022-01-19 DIAGNOSIS — G4733 Obstructive sleep apnea (adult) (pediatric): Secondary | ICD-10-CM | POA: Diagnosis not present

## 2022-01-22 ENCOUNTER — Telehealth: Payer: Self-pay | Admitting: Internal Medicine

## 2022-01-22 NOTE — Telephone Encounter (Signed)
PA for repatha submitted via CMM (Key: [id number])

## 2022-01-24 ENCOUNTER — Ambulatory Visit (INDEPENDENT_AMBULATORY_CARE_PROVIDER_SITE_OTHER): Payer: BC Managed Care – PPO | Admitting: Neurology

## 2022-01-24 ENCOUNTER — Encounter: Payer: Self-pay | Admitting: Neurology

## 2022-01-24 VITALS — BP 128/85 | HR 91 | Ht 70.0 in | Wt 198.4 lb

## 2022-01-24 DIAGNOSIS — Z8673 Personal history of transient ischemic attack (TIA), and cerebral infarction without residual deficits: Secondary | ICD-10-CM

## 2022-01-24 DIAGNOSIS — I7774 Dissection of vertebral artery: Secondary | ICD-10-CM | POA: Diagnosis not present

## 2022-01-24 NOTE — Progress Notes (Signed)
[facility] [address] [place]. [place] [address] [phone]       OFFICE FOLLOW-UP NOTE  [name] Date of Birth:  [birth year] Medical Record Number:  [id number]   HPI: Mr Jake Moore is a present 46 year old Caucasian male who is seen today for first office follow-up visit following hospital admission for stroke in December 2018. He is accompanied and his wife. History is obtained from the patient, wife and review of electronic medical records. I have personally reviewed imaging for lemons as well as his records from [facility] and [place] in care everywhereRobert F Niblack is an 46 y.o. male with HLD family history  significant for blood clots, prior tobacco use presents to the emergency room after being discharged yesterday for worsening dizziness, gait imbalance nausea headache and progressive of right-sided facial numbness. She came with same symptoms yesterday that started at 8:30 in the morning. During that visit his CT head was done and patient was given meclizine. Reviewing the note is unclear if the patient was made to walk prior to discharge. Patient states that symptoms worsened yesterday at 11 PM and numbness over his face extended from nose to the right half of his face. He states that he lifted weights the night before but denies recent chiropractic manipulation, twisting of his neck. He does complain of neck pain on the left side. His sister and mother had multiple clots. His sister event extensive evaluation including at [place] known to have higher levels of factor VIII and elevated lipoprotein A levels. He takes crestor at home, not on ASA.  Date last known well: 12.20.18.Time last known well: 8.15 am.tPA Given: outside window.NIHSS: 1.Baseline MRS 0  CT scan showed no acute abnormality. 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 Otherwise CTA of the neck and brain was unremarkable. MRI scan  of the brain showed right lateral medullary as well as tiny right cerebellar infarct. Transthoracic echo showed normal ejection fraction. Transesophageal echo showed normal cardia Embolism but Showed a Small Right to Left Shunt.Transcranial Doppler Bubble Study Was Also Positive for a Small Right to Left Shunt Only.lupus anticoagulant by LA-PTT and DRVVT negative. Anticardiolipin antibodies negative. Anti-beta-2 glycoprotein 1 antibodies negative. Homocystine 7.3. HDL 71, LDL 57. Factor V Leiden and prothrombin 20210 mutations not present. Antithrombin activity, protein C activity and protein C antigen normal. Total protein S antigen and protein S activity normal. Sedimentation rate 1. C-reactive protein negative.  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 His sister had a DVT and had age of 68 who was known to have elevated factor VIII levels. The patient's factor VIII level was normal at 146 on 04/21/2007 and on 04/21/2014 it was minimally elevated at 175 which was clinically not significant..  Prothrombin Gene and Factor V Leiden Mutations Were Negative. Lower extremity venous Dopplers were negative for DVT. Hemoglobin A1c was 5.2. LDL cholesterol was 57 mg percent. Patient was started on dual antiplatelet therapy of aspirin and Plavix after being started initially on heparin for a few days. The etiology of patient's stroke was indeterminate patient denied significant neck pain or any physical exertion to suggest dissection at that time. He agreed to proceed in the [place]. Feels transferred to inpatient rehabilitation but did well and is currently at all. He is subsequently had a second neurological opinion at [facility] neurology with Dr. Nancy Fetter as well  as hematologist' Dr Isaiah SergeMoll for family h/o elevated  factor 8 level. Patient was continued on aspirin and Plavix and not commended long-term decompression. Patient states his balance and gait have improved his has  some intermittent numbness in the left side of the face as well as some transient vertigo. He in fact was readmitted on 05/29/16 because of transient positional vertigo which resolved shortly after admission. This was felt to be peripheral positional vertigo. 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 Patient was seen by physical therapist advised him to vascular stabilization exercises as well as continue aspirin and Plavix and Crestor. Patient states his done well since discharge. No longer feeling dizzy. His been complaining of posterior neck pain and headaches and physical therapies have started dry needling is helping. He [facility] which did not show any evidence of paroxysmal atrial fibrillation. He has in psychiatrist Dr. Evelene CroonKaur for his anxiety and ADHD and she is prescribed Celexa but he has not started it yet. The patient has started going back to work but states that he gets tired easily. Is planning a trip to the [place] and he wants to start exercising and increasing his physical activity gradually. Is tolerating aspirin and Plavix without bruising or bleeding. Is tolerating vascular muscle aches and pains. His blood pressure well controlled. It is 120/76. His considering starting PCS 9 iinhibotor injections for his elevated lipoprotein a and family history of premature coronary artery disease and his insurance has denied this and he will try to get samples from his cardiologist    Update 12/16/2017 : He returns follow-up after last visit 6 months ago. He continues to do well from the stroke standpoint without recurrent stroke or TIA symptoms. He still has occasional numbness on his face and body but it is not bothersome. He has been started on eliquis by [facility] for elevated lipoprotein.a  He also seems to be  taking a baby aspirin. Hishas not had any major bruising or bleeding episodes. He states his gained some weight and plans to exercise and lose it off. He states his posterior neck pain and headaches are much better. He was initially started on Zoloft by his primary physician which helped but he had some GI issues and more recently has been switched to Pristiq. His blood pressure is well controlled. He is tolerating Crestor well without any side effects. His insurance company denied Repatha injections for his elevated lipoprotein.  Update 10/19/2019 : He returns for follow-up after last visit with me nearly 2 years ago.  He is doing well and has not had recurrent stroke or TIA symptoms.  He was concerned as recently started some worsening of his post stroke paresthesias on the right face as well as some in the left hand.  Occasionally is also having intermittent tingling of his fingertips.  He does admit to significant increasing stress recently because of his work as well as starting a new diet as well as exercise program.  Is also had a medication change.  He has been participating in the new lipoprotein a trial with Dr. Jacinto HalimGanji being done by [employer].  His platelet count has started going down which has been watched carefully.  He was previously on Eliquis started by his [facility] but used to forget the second dose hence he was switched  to Xarelto which she takes in the morning with a protein shake only.  Starting it fairly well with only minor bruising and no bleeding.  He has also been switched to Repatha and Crestor dose has been reduced from 40 mg to 5 mg daily.  His last lipid profile had shown improvement in his lipoprotein a levels as well as lipid profile but it is still not in the normal range.  He had one episode of panic attack recently.  He had obtained substantial benefit in the past with cognitive behavioral therapy and he plans to call his therapist and see him soon. Update 04/04/2021 ;  Patient is worked into the schedule emergently as he was recently hospitalized with another stroke.  He presented on 03/22/2021 when he woke up in the morning to let his dog out he rolled over on his side and noticed a sudden ringing sound in his ears.  He also noticed numbness in the right side of the body.  His wife called EMS and by the time they arrived he noticed right lower facial weakness and slurred speech.  Code stroke was called.  CT head on admission was unremarkable CT angiogram showed left vertebral artery dissection in the V3 segment with near occlusion with preserved flow distally.  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  Right V3 segment was patent.  Patient was already on Xarelto hence was not considered for thrombolysis.  He was put on IV heparin and treated conservatively and did well and remained stable.  Aspirin was added and he was placed back on Xarelto.  2D echo showed normal ejection fraction.  Lower extremity venous Doppler was negative for DVT.  LDL cholesterol 59 mg percent.  Hemoglobin A1c was 5.2.  Did well and was discharged with outpatient therapies.  He states he recovered back to his baseline.  He has mild right face and left body paresthesias which is residual from his previous stroke.  He has no residual deficits from the current stroke.  Patient has since called up his friend who is a vascular neurologist [facility] and has been referred to [facility] for second opinion and is already spoken to them and scheduled to follow-up with Dr. Cloria Spring February/ March next year.  The patient's previous stroke in February 2019 had also shown irregularity of the right vertebral artery in the V3 segment with reduction in caliber raising possibility of dissection. Update 01/24/2022 : He returns for follow-up after last visit with me in December 2022.  Continues to do very well.  He has still some diminished fine motor skills in the  left hand numbness appears to have improved.  He has had no recurrent stroke or TIA symptoms.  He is currently on aspirin for stroke prevention tolerating well without any bruising or bleeding.  He had seen Dr. Gillis Ends (neurologist at [facility] in [facility] for second opinion.  Dr. Manson Passey also recommended that he discontinue Xarelto and stay on aspirin alone.  He had MR angiogram of the brain and neck and MRI scan on 07/23/2021 which showed sequelae of chronic left cerebellar infarct and right posterior medullary infarct without any acute findings.  There was complete resolution of the previously seen left vertebral artery dissection significant intracranial vascular abnormality.  His lipoprotein a level was elevated at 405.  He was seen at the cardiovascular clinic at [facility] and they recommended aggressive medical management cholesterol and Repatha injections dieting and losing  weight and r regular exercising.  He was participating in cardiovascular clinical research trial in [place] with a new medication to lower lipoprotein a levels but discontinued his participation following clinical concerns that were raised in the trial.    ROS:   14 system review of systems is positive for  numbness, tingling, bruising and all other systems negative  PMH:  Past Medical History:  Diagnosis Date   Anxiety    CVA (cerebral vascular accident) (HCC)    History of multiple strokes    HLD (hyperlipidemia)     Social History:  Social History   Socioeconomic History   Marital status: Single    Spouse name: Not on file   Number of children: Not on file   Years of education: Not on file   Highest education level: Not on file  Occupational History   Not on file  Tobacco Use   Smoking status: Former    Years: 20.00    Types: Cigarettes    Quit date: 01/15/2016    Years since quitting: 6.0   Smokeless tobacco: Former    Types: Associate Professor Use: Never used  Substance and Sexual Activity    Alcohol use: Yes    Alcohol/week: 7.0 standard drinks of alcohol    Types: 7 Glasses of wine per week    Comment: social 10  beers per week   Drug use: No   Sexual activity: Yes  Other Topics Concern   Not on file  Social History Narrative   Not on file   Social Determinants of Health   Financial Resource Strain: Not on file  Food Insecurity: Not on file  Transportation Needs: Not on file  Physical Activity: Not on file  Stress: Not on file  Social Connections: Not on file  Intimate Partner Violence: Not on file    Medications:   Current Outpatient Medications on File Prior to Visit  Medication Sig Dispense Refill   aspirin EC 81 MG EC tablet Take 1 tablet (81 mg total) by mouth daily. Swallow whole. 30 tablet 11   FLUoxetine (PROZAC) 10 MG capsule Take 10 mg by mouth every evening.     REPATHA SURECLICK 140 MG/ML SOAJ INJECT 1 PEN INTO SKIN EVERY 14 DAYS 2 mL 11   rosuvastatin (CRESTOR) 20 MG tablet TAKE ONE TABLET DAILY 90 tablet 3   WEGOVY 0.5 MG/0.5ML SOAJ Inject 0.5 mLs into the skin once a week.     NON FORMULARY Inject 1 Dose as directed every 30 (thirty) days. Study drug     No current facility-administered medications on file prior to visit.    Allergies:   Allergies  Allergen Reactions   Penicillins Hives    Has patient had a PCN reaction causing immediate rash, facial/tongue/throat swelling, SOB or lightheadedness with hypotension: YES Has patient had a PCN reaction causing severe rash involving mucus membranes or skin necrosis: NO Has patient had a PCN reaction that required hospitalizationNO Has patient had a PCN reaction occurring within the last 10 years: NO If all of the above answers are "NO", then may proceed with Cephalosporin use. Has patient had a PCN reaction causing immediate rash, facial/tongue/throat swelling, SOB or lightheadedness with hypotension: YES Has patient had a PCN reaction causing severe rash involving mucus membranes or skin  necrosis: NO Has patient had a PCN reaction that required hospitalizationNO Has patient had a PCN reaction occurring within the last 10 years: NO If all of the above  answers are "NO", then may proceed with Cephalosporin use.   Propofol     Increased blood pressure and heart rate    Physical Exam General: well developed, well nourished young Caucasian male, seated, in no evident distress.   Head: head normocephalic and atraumatic.  Neck: supple with no carotid or supraclavicular bruits Cardiovascular: regular rate and rhythm, no murmurs Musculoskeletal: no deformity Skin:  no rash/petichiae Vascular:  Normal pulses all extremities Vitals:   01/24/22 1449  BP: 128/85  Pulse: 91   Neurologic Exam Mental Status: Awake and fully alert. Oriented to place and time. Recent and remote memory intact. Attention span, concentration and fund of knowledge appropriate. Mood and affect appropriate.  Cranial Nerves: Fundoscopic exam not done. Pupils equal, briskly reactive to light. Extraocular movements full without nystagmus but slight saccadic dysmetria on left lateral gaze.. Visual fields full to confrontation. Hearing intact.   Face, tongue, palate moves normally and symmetrically.  Motor: Normal bulk and tone. Normal strength in all tested extremity muscles.  Diminished fine finger movements on the left.  Orbits right over left upper extremity. Sensory.: intact to touch ,pinprick .position and vibratory sensation except mild hyperesthesia over the right lower face. and left body.  Coordination: Rapid alternating movements normal in all extremities. Finger-to-nose and heel-to-shin performed accurately bilaterally. Gait and Station: Arises from chair without difficulty. Stance is normal. Gait demonstrates normal stride length and balance . Able to heel, toe and tandem walk with slight difficulty.  Reflexes: 1+ and symmetric. Toes downgoing.   NIHSS  1 Modified Rankin  1  ASSESSMENT: 46 year old  Caucasian male with right lateral medullary and cerebellar infarct in December 2018 due to terminal nondominant right vertebral artery occlusion etiology indeterminate dissection versus cryptogenic. Vascular risk factors of small PFO and hyperlipidemia only. His small PFO is likely a innocent bystander Family history of elevated lipoprotein a.  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  I recommend he continue aspirin 81 mg daily for stroke prevention and maintain aggressive risk factor modification with strict control of hypertension with blood pressure goal below 140/90, lipids with LDL cholesterol goal below 55 mg percent( due to elevated lipoprotein a) and diabetes with hemoglobin A1c goal below 6.5%.  He was encouraged to eat a healthy diet with lots of fruits, vegetables, cereals and whole grains.  He was encouraged to be active and exercise regularly and lose weight.  Check follow-up CT angiogram for surveillance for his vertebral artery dissection.  Continues CPAP for his sleep apnea and follow-up in the sleep clinic.  Return for follow-up in the future in 1 year or call earlier if necessary. Greater than 50% of time during this 35 minute visit was spent on counseling,explanation of diagnosis vertebral artery occlusion, brainstem stroke, paresthesias and tingling, planning of further management, discussion with patient and family and coordination of care Delia Heady, MD  [facility] [address] [place], [place] [address]  Phone [phone] Fax [phone] Note: This document was prepared with digital dictation and possible smart phrase technology. Any transcriptional errors that result from this process are  unintentional

## 2022-01-24 NOTE — Patient Instructions (Addendum)
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  I recommend he continue aspirin 81 mg daily for stroke prevention and maintain aggressive risk factor modification with strict control of hypertension with blood pressure goal below 140/90, lipids with LDL cholesterol goal below 55 mg percent( due to elevated lipoprotein a) and diabetes with hemoglobin A1c goal below 6.5%.  He was encouraged to eat a healthy diet with lots of fruits, vegetables, cereals and whole grains.  He was encouraged to be active and exercise regularly and lose weight.  Check follow-up CT angiogram for surveillance for his vertebral artery dissection.  Continues CPAP for his sleep apnea and follow-up in the sleep clinic.  Return for follow-up in the future in 1 year or call earlier if necessary.  Stroke Prevention Some medical conditions and behaviors can lead to a higher chance of having a stroke. You can help prevent a stroke by eating healthy, exercising, not smoking, and managing any medical conditions you have. Stroke is a leading cause of functional impairment. Primary prevention is particularly important because a majority of strokes are first-time events. Stroke changes the lives of not only those who experience a stroke but also their family and other caregivers. How can this condition affect me? A stroke is a medical emergency and should be treated right away. A stroke can lead to brain damage and can sometimes be life-threatening. If a person gets medical treatment right away, there is a better chance of surviving and recovering from a stroke. What can increase my risk? The following medical conditions may increase your risk of a stroke: Cardiovascular disease. High blood pressure (hypertension). Diabetes. High cholesterol. Sickle cell disease. Blood clotting disorders (hypercoagulable state). Obesity. Sleep disorders  (obstructive sleep apnea). Other risk factors include: Being older than age 91. Having a history of blood clots, stroke, or mini-stroke (transient ischemic attack, TIA). Genetic factors, such as race, ethnicity, or a family history of stroke. Smoking cigarettes or using other tobacco products. Taking birth control pills, especially if you also use tobacco. Heavy use of alcohol or drugs, especially cocaine and methamphetamine. Physical inactivity. What actions can I take to prevent this? Manage your health conditions High cholesterol levels. Eating a healthy diet is important for preventing high cholesterol. If cholesterol cannot be managed through diet alone, you may need to take medicines. Take any prescribed medicines to control your cholesterol as told by your health care provider. Hypertension. To reduce your risk of stroke, try to keep your blood pressure below 130/80. Eating a healthy diet and exercising regularly are important for controlling blood pressure. If these steps are not enough to manage your blood pressure, you may need to take medicines. Take any prescribed medicines to control hypertension as told by your health care provider. Ask your health care provider if you should monitor your blood pressure at home. Have your blood pressure checked every year, even if your blood pressure is normal. Blood pressure increases with age and some medical conditions. Diabetes. Eating a healthy diet and exercising regularly are important parts of managing your blood sugar (glucose). If your blood sugar cannot be managed through diet and exercise, you may need to take medicines. Take any prescribed medicines to control your diabetes as told by your health care provider. Get evaluated for obstructive sleep apnea. Talk to your health care provider about getting a sleep evaluation if you snore a lot or have excessive sleepiness.  Make sure that any other medical conditions you have, such as  atrial fibrillation or atherosclerosis, are managed. Nutrition Follow instructions from your health care provider about what to eat or drink to help manage your health condition. These instructions may include: Reducing your daily calorie intake. Limiting how much salt (sodium) you use to 1,500 milligrams (mg) each day. Using only healthy fats for cooking, such as olive oil, canola oil, or sunflower oil. Eating healthy foods. You can do this by: Choosing foods that are high in fiber, such as whole grains, and fresh fruits and vegetables. Eating at least 5 servings of fruits and vegetables a day. Try to fill one-half of your plate with fruits and vegetables at each meal. Choosing lean protein foods, such as lean cuts of meat, poultry without skin, fish, tofu, beans, and nuts. Eating low-fat dairy products. Avoiding foods that are high in sodium. This can help lower blood pressure. Avoiding foods that have saturated fat, trans fat, and cholesterol. This can help prevent high cholesterol. Avoiding processed and prepared foods. Counting your daily carbohydrate intake.  Lifestyle If you drink alcohol: Limit how much you have to: 0-1 drink a day for women who are not pregnant. 0-2 drinks a day for men. Know how much alcohol is in your drink. In the [place] one drink equals one 12 oz bottle of beer (392mL), one 5 oz glass of wine (184mL), or one 1 oz glass of hard liquor (19mL). Do not use any products that contain nicotine or tobacco. These products include cigarettes, chewing tobacco, and vaping devices, such as e-cigarettes. If you need help quitting, ask your health care provider. Avoid secondhand smoke. Do not use drugs. Activity  Try to stay at a healthy weight. Get at least 30 minutes of exercise on most days, such as: Fast walking. Biking. Swimming. Medicines Take over-the-counter and prescription medicines only as told by your health care provider. Aspirin or blood thinners  (antiplatelets or anticoagulants) may be recommended to reduce your risk of forming blood clots that can lead to stroke. Avoid taking birth control pills. Talk to your health care provider about the risks of taking birth control pills if: You are over 68 years old. You smoke. You get very bad headaches. You have had a blood clot. Where to find more information American Stroke Association: www.strokeassociation.org Get help right away if: You or a loved one has any symptoms of a stroke. "BE FAST" is an easy way to remember the main warning signs of a stroke: B - Balance. Signs are dizziness, sudden trouble walking, or loss of balance. E - Eyes. Signs are trouble seeing or a sudden change in vision. F - Face. Signs are sudden weakness or numbness of the face, or the face or eyelid drooping on one side. A - Arms. Signs are weakness or numbness in an arm. This happens suddenly and usually on one side of the body. S - Speech. Signs are sudden trouble speaking, slurred speech, or trouble understanding what people say. T - Time. Time to call emergency services. Write down what time symptoms started. You or a loved one has other signs of a stroke, such as: A sudden, severe headache with no known cause. Nausea or vomiting. Seizure. These symptoms may represent a serious problem that is an emergency. Do not wait to see if the symptoms will go away. Get medical help right away. Call your local emergency services (911 in the [place] Do not drive yourself to the hospital. Summary You  can help to prevent a stroke by eating healthy, exercising, not smoking, limiting alcohol intake, and managing any medical conditions you may have. Do not use any products that contain nicotine or tobacco. These include cigarettes, chewing tobacco, and vaping devices, such as e-cigarettes. If you need help quitting, ask your health care provider. Remember "BE FAST" for warning signs of a stroke. Get help right away if you or a  loved one has any of these signs. This information is not intended to replace advice given to you by your health care provider. Make sure you discuss any questions you have with your health care provider. Document Revised: 11/08/2019 Document Reviewed: 11/08/2019 Elsevier Patient Education  2023 [employer].

## 2022-01-24 NOTE — Telephone Encounter (Signed)
Message from Plan Effective from 01/22/2022 through 01/21/2023.

## 2022-01-25 ENCOUNTER — Telehealth: Payer: Self-pay | Admitting: Neurology

## 2022-01-25 NOTE — Telephone Encounter (Signed)
[employer]BS Josem Kaufmann: [id number] exp. 01/25/22-02/23/22 sent to GI [phone]

## 2022-02-03 DIAGNOSIS — G4733 Obstructive sleep apnea (adult) (pediatric): Secondary | ICD-10-CM | POA: Diagnosis not present

## 2022-02-08 ENCOUNTER — Ambulatory Visit (HOSPITAL_BASED_OUTPATIENT_CLINIC_OR_DEPARTMENT_OTHER): Payer: BC Managed Care – PPO | Admitting: Internal Medicine

## 2022-02-14 DIAGNOSIS — E039 Hypothyroidism, unspecified: Secondary | ICD-10-CM | POA: Diagnosis not present

## 2022-02-14 DIAGNOSIS — E785 Hyperlipidemia, unspecified: Secondary | ICD-10-CM | POA: Diagnosis not present

## 2022-02-14 DIAGNOSIS — Z125 Encounter for screening for malignant neoplasm of prostate: Secondary | ICD-10-CM | POA: Diagnosis not present

## 2022-02-14 LAB — COMPREHENSIVE METABOLIC PANEL: EGFR: 91.7

## 2022-02-18 DIAGNOSIS — G4733 Obstructive sleep apnea (adult) (pediatric): Secondary | ICD-10-CM | POA: Diagnosis not present

## 2022-02-20 DIAGNOSIS — E7849 Other hyperlipidemia: Secondary | ICD-10-CM | POA: Diagnosis not present

## 2022-02-21 DIAGNOSIS — R82998 Other abnormal findings in urine: Secondary | ICD-10-CM | POA: Diagnosis not present

## 2022-02-21 DIAGNOSIS — E669 Obesity, unspecified: Secondary | ICD-10-CM | POA: Diagnosis not present

## 2022-02-21 DIAGNOSIS — R030 Elevated blood-pressure reading, without diagnosis of hypertension: Secondary | ICD-10-CM | POA: Diagnosis not present

## 2022-02-21 DIAGNOSIS — Z1339 Encounter for screening examination for other mental health and behavioral disorders: Secondary | ICD-10-CM | POA: Diagnosis not present

## 2022-02-21 DIAGNOSIS — Z23 Encounter for immunization: Secondary | ICD-10-CM | POA: Diagnosis not present

## 2022-02-21 DIAGNOSIS — Z1331 Encounter for screening for depression: Secondary | ICD-10-CM | POA: Diagnosis not present

## 2022-02-21 DIAGNOSIS — Z0000 Encounter for general adult medical examination without abnormal findings: Secondary | ICD-10-CM | POA: Diagnosis not present

## 2022-02-21 LAB — HEMOGLOBIN A1C: A1c: 4.8

## 2022-03-21 DIAGNOSIS — G4733 Obstructive sleep apnea (adult) (pediatric): Secondary | ICD-10-CM | POA: Diagnosis not present

## 2022-03-27 DIAGNOSIS — G4733 Obstructive sleep apnea (adult) (pediatric): Secondary | ICD-10-CM | POA: Diagnosis not present

## 2022-04-27 DIAGNOSIS — G4733 Obstructive sleep apnea (adult) (pediatric): Secondary | ICD-10-CM | POA: Diagnosis not present

## 2022-04-30 ENCOUNTER — Encounter (HOSPITAL_BASED_OUTPATIENT_CLINIC_OR_DEPARTMENT_OTHER): Payer: Self-pay | Admitting: Internal Medicine

## 2022-04-30 ENCOUNTER — Ambulatory Visit (INDEPENDENT_AMBULATORY_CARE_PROVIDER_SITE_OTHER): Payer: BC Managed Care – PPO | Admitting: Internal Medicine

## 2022-04-30 VITALS — BP 118/78 | HR 82 | Ht 70.0 in | Wt 198.0 lb

## 2022-04-30 DIAGNOSIS — E785 Hyperlipidemia, unspecified: Secondary | ICD-10-CM

## 2022-04-30 DIAGNOSIS — Z8673 Personal history of transient ischemic attack (TIA), and cerebral infarction without residual deficits: Secondary | ICD-10-CM

## 2022-04-30 DIAGNOSIS — E7841 Elevated Lipoprotein(a): Secondary | ICD-10-CM | POA: Diagnosis not present

## 2022-04-30 NOTE — Patient Instructions (Signed)
Medication Instructions:  NO CHANGES  *If you need a refill on your cardiac medications before your next appointment, please call your pharmacy*   Lab Work: FASTING lab work to check cholesterol in 12 months -- before next visit  If you have labs (blood work) drawn today and your tests are completely normal, you will receive your results only by: [place] (if you have MyChart) OR A paper copy in the mail If you have any lab test that is abnormal or we need to change your treatment, we will call you to review the results.   Testing/Procedures: NONE   Follow-Up: At [facility], you and your health needs are our priority.  As part of our continuing mission to provide you with exceptional heart care, we have created designated Provider Care Teams.  These Care Teams include your primary Cardiologist (physician) and Advanced Practice Providers (APPs -  Physician Assistants and Nurse Practitioners) who all work together to provide you with the care you need, when you need it.  We recommend signing up for the patient portal called "MyChart".  Sign up information is provided on this After Visit Summary.  MyChart is used to connect with patients for Virtual Visits (Telemedicine).  Patients are able to view lab/test results, encounter notes, upcoming appointments, etc.  Non-urgent messages can be sent to your provider as well.   To learn more about what you can do with MyChart, go to [email or web address].    Your next appointment:   12 month(s)  The format for your next appointment:   In Person  Provider:   Lyman Bishop MD

## 2022-04-30 NOTE — Progress Notes (Signed)
LIPID CLINIC CONSULT NOTE  Chief Complaint:  Follow-up dyslipidemia  Primary Care Physician: Jake Organ., MD  Primary Cardiologist:  None  HPI:  Jake Moore is a 47 y.o. male who is being seen today for the evaluation of dyslipidemia at the request of Jake Organ., MD. Jake Moore was seen today for virtual visit.  He is a patient of Jake Moore and has had previous work-up by my partners in cardiology including Jake Moore, Jake Moore (who he is friends with) and even communications with Jake Moore.  Jake Moore unfortunately has had a TIA/stroke which is unusual given his young age.  Family history is significant for a sister who has a hypercoagulable disorder and also I was found to have an elevated LP(a).  This led to testing for LP(a) which was significantly elevated at 385 by lab work in November 2019.  Based on this he has been on aggressive lipid management.  Currently taking rosuvastatin 40 mg daily.  He was also started on Repatha as there is evidence for LP(a) lowering with this medication.  Additionally he takes Eliquis 5 mg twice daily for stroke prevention.  During his work-up he underwent a TEE which showed a small PFO.  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  He is additionally listed as taking aspirin 81 mg daily.  Discussion with his neurologist at [facility] felt that he may do fine with just aspirin alone.  He sought out second opinions and was seen by both neurology and in the lipid clinic by Jake Moore at [facility] healthcare.  Jake Moore felt that his lipids were appropriately treated for what we have currently available.  We did discuss at some length today about several enrolling clinical trials including an antisense oligonucleotide to LP(a) which is currently in phase 3 clinical trials.   Unfortunately there are no research sites in [place].  07/08/2019  Jake Moore returns today for follow-up.  Overall he is feeling well.  He is recovered from his stroke but has not been able to get back to normal exercise.  Unfortunately has been a weight gain which is up from 179 pounds to 208 pounds over the past couple years.  This is been associated with an increase in his lipids.  LDL has gone up from 40-76 and his total cholesterol is up from 117-1 81.  He remains on Repatha, rosuvastatin 40 mg and aspirin and Eliquis.  He has had no further stroke.  He does have a very high LP(a) for which we were investigating therapy.  Recently we have been notified that local clinical trials center has a clinical study for LP(a) lowering with pelcarseran -which is an antisense to LP(a) made by [employer].  This is being coordinated by Jake Moore, PharmD.   08/30/2020  Jake Moore is seen today for follow-up. He continues to do well on Repatha and rosuvastatin. He has not had further stroke. He had a very high LP(a).  I had referred him to medication management for a clinical trial for an antisense LP(a) inhibitor.  He has enrolled in the trial but is not clear whether he is on placebo or treatment.  Recent lipid profile from his PCP showed total cholesterol 181, HDL 66, LDL 99 and triglycerides 90.  The LDL has trended up some but  he is also had some weight gain.  04/30/2022  Jake Moore is seen today in follow-up. He unfortunately had another stroke back in December 2022.  He has also been evaluated at the [facility].  Workup there was generally nonrevealing.  It did rule out connective tissue disorder.  His LP(a) has improved substantially on therapy.  More recently the LP(a) measured 226 nmol/L.  His total cholesterol now is 133, triglycerides 63, HDL 71 and LDL 49 on combination therapy with Repatha and high-dose rosuvastatin 20 mg daily.  He is on low-dose aspirin.  It was felt that  long-term anticoagulation was not supported by sufficient evidence and therefore he is not on it.  He has also recently started on some Wegovy although his recent medication approval was denied by the insurance companies.  PMHx:  Past Medical History:  Diagnosis Date   Anxiety    CVA (cerebral vascular accident) (HCC)    History of multiple strokes    HLD (hyperlipidemia)     Past Surgical History:  Procedure Laterality Date   TEE WITHOUT CARDIOVERSION N/A 04/14/2017   Procedure: TRANSESOPHAGEAL ECHOCARDIOGRAM (TEE);  Surgeon: Dolores Patty, MD;  Location: [facility] ENDOSCOPY;  Service: Cardiovascular;  Laterality: N/A;    FAMHx:  Family History  Problem Relation Age of Onset   Other Sister        hypercoagulable state with SCAD   CAD Maternal Grandfather    Sleep apnea Father     SOCHx:   reports that he quit smoking about 6 years ago. His smoking use included cigarettes. He has quit using smokeless tobacco.  His smokeless tobacco use included chew. He reports current alcohol use of about 7.0 standard drinks of alcohol per week. He reports that he does not use drugs.  ALLERGIES:  Allergies  Allergen Reactions   Penicillins Hives    Has patient had a PCN reaction causing immediate rash, facial/tongue/throat swelling, SOB or lightheadedness with hypotension: YES Has patient had a PCN reaction causing severe rash involving mucus membranes or skin necrosis: NO Has patient had a PCN reaction that required hospitalizationNO Has patient had a PCN reaction occurring within the last 10 years: NO If all of the above answers are "NO", then may proceed with Cephalosporin use. Has patient had a PCN reaction causing immediate rash, facial/tongue/throat swelling, SOB or lightheadedness with hypotension: YES Has patient had a PCN reaction causing severe rash involving mucus membranes or skin necrosis: NO Has patient had a PCN reaction that required hospitalizationNO Has patient had a PCN  reaction occurring within the last 10 years: NO If all of the above answers are "NO", then may proceed with Cephalosporin use.   Propofol     Increased blood pressure and heart rate    ROS: Pertinent items noted in HPI and remainder of comprehensive ROS otherwise negative.  HOME MEDS: Current Outpatient Medications on File Prior to Visit  Medication Sig Dispense Refill   aspirin EC 81 MG EC tablet Take 1 tablet (81 mg total) by mouth daily. Swallow whole. 30 tablet 11   FLUoxetine (PROZAC) 10 MG capsule Take 10 mg by mouth every evening.     REPATHA SURECLICK 140 MG/ML SOAJ INJECT 1 PEN INTO SKIN EVERY 14 DAYS 2 mL 11   rosuvastatin (CRESTOR) 20 MG tablet TAKE ONE TABLET DAILY 90 tablet 3   WEGOVY 1.7 MG/0.75ML SOAJ Inject 1.7 mg into the skin once a week.     No current facility-administered medications on file prior to  visit.    LABS/IMAGING: No results found for this or any previous visit (from the past 48 hour(s)). No results found.  LIPID PANEL:    Component Value Date/Time   CHOL 133 03/22/2021 0833   CHOL 117 05/08/2018 0815   TRIG 43 03/22/2021 0833   HDL 65 03/22/2021 0833   HDL 63 05/08/2018 0815   CHOLHDL 2.0 03/22/2021 0833   VLDL 9 03/22/2021 0833   LDLCALC 59 03/22/2021 0833   LDLCALC 40 05/08/2018 0815    WEIGHTS: Wt Readings from Last 3 Encounters:  04/30/22 198 lb (89.8 kg)  01/24/22 198 lb 6.4 oz (90 kg)  06/28/21 200 lb (90.7 kg)    VITALS: BP 118/78   Moore 82   Ht 5\' 10"  (1.778 m)   Wt 198 lb (89.8 kg)   BMI 28.41 kg/m   EXAM: Deferred  EKG: Deferred  ASSESSMENT: History of stroke x 2 Small PFO High LP(a) - 385 nmol/L, improved to 226 nmol/L Dyslipidemia, goal LDL less than 55  PLAN: 1.   Mr. Costales has had some interval reduction in his LP(a) on Repatha.  He was enrolled in the [employer] LP(a) lowering trial, but apparently was on placebo.  His trial has concluded and there were some safety issues.  There is no other LP(a)  lowering trial that is currently enrolling.  I expect it will be 3 to 4 years before we would potentially CVs medications available on the market.  For now he is on the best available therapy for lipid lowering.  Follow-up in 1 year.  05-26-1999, MD, [facility], FACP  [place]  [facility]  Medical Director of the Advanced Lipid Disorders &  Cardiovascular Risk Reduction Clinic Diplomate of the American Board of Clinical Lipidology Attending Cardiologist  Direct Dial: [phone]  Fax: [phone]  Website:  [phone] Nehemiah Montee 04/30/2022, 10:16 AM

## 2022-05-28 DIAGNOSIS — G4733 Obstructive sleep apnea (adult) (pediatric): Secondary | ICD-10-CM | POA: Diagnosis not present

## 2022-06-07 DIAGNOSIS — E785 Hyperlipidemia, unspecified: Secondary | ICD-10-CM | POA: Diagnosis not present

## 2022-06-07 DIAGNOSIS — E669 Obesity, unspecified: Secondary | ICD-10-CM | POA: Diagnosis not present

## 2022-06-07 DIAGNOSIS — E7841 Elevated Lipoprotein(a): Secondary | ICD-10-CM | POA: Diagnosis not present

## 2022-06-07 DIAGNOSIS — I6509 Occlusion and stenosis of unspecified vertebral artery: Secondary | ICD-10-CM | POA: Diagnosis not present

## 2022-06-18 DIAGNOSIS — F1010 Alcohol abuse, uncomplicated: Secondary | ICD-10-CM | POA: Diagnosis not present

## 2022-06-25 DIAGNOSIS — F1010 Alcohol abuse, uncomplicated: Secondary | ICD-10-CM | POA: Diagnosis not present

## 2022-07-02 DIAGNOSIS — F1010 Alcohol abuse, uncomplicated: Secondary | ICD-10-CM | POA: Diagnosis not present

## 2022-07-09 DIAGNOSIS — G4733 Obstructive sleep apnea (adult) (pediatric): Secondary | ICD-10-CM | POA: Diagnosis not present

## 2022-07-09 DIAGNOSIS — F1010 Alcohol abuse, uncomplicated: Secondary | ICD-10-CM | POA: Diagnosis not present

## 2022-07-16 DIAGNOSIS — F1010 Alcohol abuse, uncomplicated: Secondary | ICD-10-CM | POA: Diagnosis not present

## 2022-07-23 DIAGNOSIS — F1010 Alcohol abuse, uncomplicated: Secondary | ICD-10-CM | POA: Diagnosis not present

## 2022-08-06 DIAGNOSIS — F1010 Alcohol abuse, uncomplicated: Secondary | ICD-10-CM | POA: Diagnosis not present

## 2022-08-09 DIAGNOSIS — G4733 Obstructive sleep apnea (adult) (pediatric): Secondary | ICD-10-CM | POA: Diagnosis not present

## 2022-08-13 DIAGNOSIS — F1010 Alcohol abuse, uncomplicated: Secondary | ICD-10-CM | POA: Diagnosis not present

## 2022-08-20 DIAGNOSIS — F1010 Alcohol abuse, uncomplicated: Secondary | ICD-10-CM | POA: Diagnosis not present

## 2022-09-03 DIAGNOSIS — F1010 Alcohol abuse, uncomplicated: Secondary | ICD-10-CM | POA: Diagnosis not present

## 2022-09-08 DIAGNOSIS — G4733 Obstructive sleep apnea (adult) (pediatric): Secondary | ICD-10-CM | POA: Diagnosis not present

## 2022-09-11 DIAGNOSIS — F1010 Alcohol abuse, uncomplicated: Secondary | ICD-10-CM | POA: Diagnosis not present

## 2022-09-13 DIAGNOSIS — R030 Elevated blood-pressure reading, without diagnosis of hypertension: Secondary | ICD-10-CM | POA: Diagnosis not present

## 2022-09-13 DIAGNOSIS — E785 Hyperlipidemia, unspecified: Secondary | ICD-10-CM | POA: Diagnosis not present

## 2022-09-13 DIAGNOSIS — E7841 Elevated Lipoprotein(a): Secondary | ICD-10-CM | POA: Diagnosis not present

## 2022-09-13 DIAGNOSIS — I6509 Occlusion and stenosis of unspecified vertebral artery: Secondary | ICD-10-CM | POA: Diagnosis not present

## 2022-09-18 DIAGNOSIS — F1010 Alcohol abuse, uncomplicated: Secondary | ICD-10-CM | POA: Diagnosis not present

## 2022-09-24 DIAGNOSIS — F1010 Alcohol abuse, uncomplicated: Secondary | ICD-10-CM | POA: Diagnosis not present

## 2022-12-13 ENCOUNTER — Other Ambulatory Visit: Payer: Self-pay | Admitting: Internal Medicine

## 2023-01-10 ENCOUNTER — Other Ambulatory Visit (HOSPITAL_COMMUNITY): Payer: Self-pay

## 2023-01-13 ENCOUNTER — Other Ambulatory Visit (HOSPITAL_COMMUNITY): Payer: Self-pay

## 2023-01-13 ENCOUNTER — Telehealth: Payer: Self-pay

## 2023-01-13 NOTE — Telephone Encounter (Signed)
Pharmacy Patient Advocate Encounter   Received notification from CoverMyMeds that prior authorization for REPATHA is required/requested.   Insurance verification completed.   The patient is insured through [facility] .   Per test claim: PA required; PA submitted to [employer] via CoverMyMeds Key/confirmation #/EOC [facility] Status is pending

## 2023-01-13 NOTE — Telephone Encounter (Signed)
Pharmacy Patient Advocate Encounter  Received notification from [facility] that Prior Authorization for REPATHA has been APPROVED from 01/09/23 to 01/09/24

## 2023-02-06 ENCOUNTER — Ambulatory Visit (INDEPENDENT_AMBULATORY_CARE_PROVIDER_SITE_OTHER): Payer: BC Managed Care – PPO | Admitting: Neurology

## 2023-02-06 ENCOUNTER — Encounter: Payer: Self-pay | Admitting: Neurology

## 2023-02-06 VITALS — BP 135/94 | HR 91 | Ht 70.0 in | Wt 203.0 lb

## 2023-02-06 DIAGNOSIS — Z8673 Personal history of transient ischemic attack (TIA), and cerebral infarction without residual deficits: Secondary | ICD-10-CM | POA: Diagnosis not present

## 2023-02-06 DIAGNOSIS — Z8669 Personal history of other diseases of the nervous system and sense organs: Secondary | ICD-10-CM

## 2023-02-06 DIAGNOSIS — E785 Hyperlipidemia, unspecified: Secondary | ICD-10-CM | POA: Diagnosis not present

## 2023-02-06 NOTE — Patient Instructions (Signed)
I had a long discussion with the patient regarding his left vertebral artery dissection and remote strokes and is doing well..  I recommend he continue aspirin 81 mg daily for stroke prevention and maintain aggressive risk factor modification with strict control of hypertension with blood pressure goal below 140/90, lipids with LDL cholesterol goal below 55 mg percent( due to elevated lipoprotein a) and diabetes with hemoglobin A1c goal below 6.5%.  He was encouraged to eat a healthy diet with lots of fruits, vegetables, cereals and whole grains.  He was encouraged to be active and exercise regularly and lose weight.  Check follow-up lipid profile, lipoprotein a and hemoglobin A1c.  Continues CPAP for his sleep apnea and follow-up in the sleep clinic with Dr. Frances Furbish to help troubleshoot his problems with CPAP mask..  Return for follow-up in the future in 1 year or call earlier if necessary.

## 2023-02-06 NOTE — Progress Notes (Signed)
[facility] [address] [place]. [place] [address] [phone]       OFFICE FOLLOW-UP NOTE  Jake. Jake Moore Date of Birth:  1976-03-15 Medical Record Number:  [id number]   HPI: Jake Moore is a present 47 year old Caucasian male who is seen today for first office follow-up visit following hospital admission for stroke in December 2018. He is accompanied and his wife. History is obtained from the patient, wife and review of electronic medical records. I have personally reviewed imaging for lemons as well as his records from [facility] and [facility] in care everywhereRobert F Moore is an 47 y.o. male with HLD family history  significant for blood clots, prior tobacco use presents to the emergency room after being discharged yesterday for worsening dizziness, gait imbalance nausea headache and progressive of right-sided facial numbness. She came with same symptoms yesterday that started at 8:30 in the morning. During that visit his CT head was done and patient was given meclizine. Reviewing the note is unclear if the patient was made to walk prior to discharge. Patient states that symptoms worsened yesterday at 11 PM and numbness over his face extended from nose to the right half of his face. He states that he lifted weights the night before but denies recent chiropractic manipulation, twisting of his neck. He does complain of neck pain on the left side. His sister and mother had multiple clots. His sister event extensive evaluation [facility] known to have higher levels of factor VIII and elevated lipoprotein A levels. He takes crestor at home, not on ASA.  Date last known well: 12.20.18.Time last known well: 8.15 am.tPA Given: outside window.NIHSS: 1.Baseline MRS 0  CT scan showed no acute abnormality. 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 Otherwise CTA of the neck and brain was unremarkable. MRI scan  of the brain showed right lateral medullary as well as tiny right cerebellar infarct. Transthoracic echo showed normal ejection fraction. Transesophageal echo showed normal cardia Embolism but Showed a Small Right to Left Shunt.Transcranial Doppler Bubble Study Was Also Positive for a Small Right to Left Shunt Only.lupus anticoagulant by LA-PTT and DRVVT negative. Anticardiolipin antibodies negative. Anti-beta-2 glycoprotein 1 antibodies negative. Homocystine 7.3. HDL 71, LDL 57. Factor V Leiden and prothrombin 84696 mutations not present. Antithrombin activity, protein C activity and protein C antigen normal. Total protein S antigen and protein S activity normal. Sedimentation rate 1. C-reactive protein negative.  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 His sister had a DVT and had age of 78 who was known to have elevated factor VIII levels. The patient's factor VIII level was normal at 146 on 04/21/2007 and on 04/21/2014 it was minimally elevated at 175 which was clinically not significant..  Prothrombin Gene and Factor V Leiden Mutations Were Negative. Lower extremity venous Dopplers were negative for DVT. Hemoglobin A1c was 5.2. LDL cholesterol was 57 mg percent. Patient was started on dual antiplatelet therapy of aspirin and Plavix after being started initially on heparin for a few days. The etiology of patient's stroke was indeterminate patient denied significant neck pain or any physical exertion to suggest dissection at that time. He agreed to proceed in the [facility]. Feels transferred to inpatient rehabilitation but did well and is currently at all. He is subsequently had a second neurological opinion at [facility] neurology with Dr. Blondell Reveal as well  as hematologist' Dr Isaiah Serge for family h/o elevated  factor 8 level. Patient was continued on aspirin and Plavix and not commended long-term decompression. Patient states his balance and gait have improved his has  some intermittent numbness in the left side of the face as well as some transient vertigo. He in fact was readmitted on 05/29/16 because of transient positional vertigo which resolved shortly after admission. This was felt to be peripheral positional vertigo. 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 Patient was seen by physical therapist advised him to vascular stabilization exercises as well as continue aspirin and Plavix and Crestor. Patient states his done well since discharge. No longer feeling dizzy. His been complaining of posterior neck pain and headaches and physical therapies have started dry needling is helping. He had a outpatient heart monitor done at [facility] on 05/19/17 which did not show any evidence of paroxysmal atrial fibrillation. He has in psychiatrist Dr. Evelene Croon for his anxiety and ADHD and she is prescribed Celexa but he has not started it yet. The patient has started going back to work but states that he gets tired easily. Is planning a trip to the [place] and he wants to start exercising and increasing his physical activity gradually. Is tolerating aspirin and Plavix without bruising or bleeding. Is tolerating vascular muscle aches and pains. His blood pressure well controlled. It is 120/76. His considering starting PCS 9 iinhibotor injections for his elevated lipoprotein a and family history of premature coronary artery disease and his insurance has denied this and he will try to get samples from his cardiologist    Update 12/16/2017 : He returns follow-up after last visit 6 months ago. He continues to do well from the stroke standpoint without recurrent stroke or TIA symptoms. He still has occasional numbness on his face and body but it is not bothersome. He has been started on eliquis by his cardiologist from [facility] for elevated lipoprotein.a  He also seems to be  taking a baby aspirin. Hishas not had any major bruising or bleeding episodes. He states his gained some weight and plans to exercise and lose it off. He states his posterior neck pain and headaches are much better. He was initially started on Zoloft by his primary physician which helped but he had some GI issues and more recently has been switched to Pristiq. His blood pressure is well controlled. He is tolerating Crestor well without any side effects. His insurance company denied Repatha injections for his elevated lipoprotein.  Update 10/19/2019 : He returns for follow-up after last visit with me nearly 2 years ago.  He is doing well and has not had recurrent stroke or TIA symptoms.  He was concerned as recently started some worsening of his post stroke paresthesias on the right face as well as some in the left hand.  Occasionally is also having intermittent tingling of his fingertips.  He does admit to significant increasing stress recently because of his work as well as starting a new diet as well as exercise program.  Is also had a medication change.  He has been participating in the new lipoprotein a trial with Dr. Jacinto Halim being done by [employer].  His platelet count has started going down which has been watched carefully.  He was previously on Eliquis started by his cardiologist at [facility] but used to forget the second dose hence he was switched  to Xarelto which she takes in the morning with a protein shake only.  Starting it fairly well with only minor bruising and no bleeding.  He has also been switched to Repatha and Crestor dose has been reduced from 40 mg to 5 mg daily.  His last lipid profile had shown improvement in his lipoprotein a levels as well as lipid profile but it is still not in the normal range.  He had one episode of panic attack recently.  He had obtained substantial benefit in the past with cognitive behavioral therapy and he plans to call his therapist and see him soon. Update 04/04/2021 ;  Patient is worked into the schedule emergently as he was recently hospitalized with another stroke.  He presented on 03/22/2021 when he woke up in the morning to let his dog out he rolled over on his side and noticed a sudden ringing sound in his ears.  He also noticed numbness in the right side of the body.  His wife called EMS and by the time they arrived he noticed right lower facial weakness and slurred speech.  Code stroke was called.  CT head on admission was unremarkable CT angiogram showed left vertebral artery dissection in the V3 segment with near occlusion with preserved flow distally.  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  Right V3 segment was patent.  Patient was already on Xarelto hence was not considered for thrombolysis.  He was put on IV heparin and treated conservatively and did well and remained stable.  Aspirin was added and he was placed back on Xarelto.  2D echo showed normal ejection fraction.  Lower extremity venous Doppler was negative for DVT.  LDL cholesterol 59 mg percent.  Hemoglobin A1c was 5.2.  Did well and was discharged with outpatient therapies.  He states he recovered back to his baseline.  He has mild right face and left body paresthesias which is residual from his previous stroke.  He has no residual deficits from the current stroke.  Patient has since called up his friend who is a vascular neurologist at [facility] and has been referred to [facility] for second opinion and is already spoken to them and scheduled to follow-up with Dr. Cloria Spring February/ March next year.  The patient's previous stroke in February 2019 had also shown irregularity of the right vertebral artery in the V3 segment with reduction in caliber raising possibility of dissection. Update 01/24/2022 : He returns for follow-up after last visit with me in December 2022.  Continues to do very well.  He has still some diminished fine motor skills in the  left hand numbness appears to have improved.  He has had no recurrent stroke or TIA symptoms.  He is currently on aspirin for stroke prevention tolerating well without any bruising or bleeding.  He had seen Dr. Gillis Ends (neurologist at [facility] in [facility] for second opinion.  Dr. Manson Passey also recommended that he discontinue Xarelto and stay on aspirin alone.  He had Jake angiogram of the brain and neck and MRI scan on 07/23/2021 which showed sequelae of chronic left cerebellar infarct and right posterior medullary infarct without any acute findings.  There was complete resolution of the previously seen left vertebral artery dissection significant intracranial vascular abnormality.  His lipoprotein a level was elevated at 405.  He was seen at the cardiovascular clinic at [facility] and they recommended aggressive medical management cholesterol and Repatha injections dieting and losing  weight and r regular exercising.  He was participating in cardiovascular clinical research trial in [place] with a new medication to lower lipoprotein a levels but discontinued his participation following clinical concerns that were raised in the trial. 02/06/2023 ; he returns for follow-up after last visit a year ago.  He is doing well without recurrent stroke or TIA symptoms.  Continues to have minimum left hand symptoms.  He remains on aspirin 81 mg daily which is tolerating well without side effects.  He is also on cholesterol on Repatha injections and tolerating them well without side effects however he has not had follow-up lipid profile or lipoprotein a levels checked in more than a year.  He has not been eating healthy and in fact has gained weight and knows he needs to diet and be more active and exercise and lose weight.  He has not had any new health issues.  He has no new complaints. ROS:   14 system review of systems is positive for weight gain, numbness, tingling, bruising and all other systems negative  PMH:  Past  Medical History:  Diagnosis Date   Anxiety    CVA (cerebral vascular accident) (HCC)    History of multiple strokes    HLD (hyperlipidemia)     Social History:  Social History   Socioeconomic History   Marital status: Single    Spouse name: Not on file   Number of children: Not on file   Years of education: Not on file   Highest education level: Not on file  Occupational History   Not on file  Tobacco Use   Smoking status: Former    Current packs/day: 0.00    Types: Cigarettes    Start date: 01/15/1996    Quit date: 01/15/2016    Years since quitting: 7.0   Smokeless tobacco: Former    Types: Engineer, drilling   Vaping status: Never Used  Substance and Sexual Activity   Alcohol use: Yes    Alcohol/week: 7.0 standard drinks of alcohol    Types: 7 Glasses of wine per week    Comment: social 10  beers per week   Drug use: No   Sexual activity: Yes  Other Topics Concern   Not on file  Social History Narrative   Not on file   Social Determinants of Health   Financial Resource Strain: Low Risk  (07/16/2021)   Received from [facility], [facility]   Overall Financial Resource Strain (CARDIA)    Difficulty of Paying Living Expenses: Not hard at all  Food Insecurity: No Food Insecurity (07/16/2021)   Received from [facility], [facility]   Hunger Vital Sign    Worried About Running Out of Food in the Last Year: Never true    Ran Out of Food in the Last Year: Never true  Transportation Needs: No Transportation Needs (07/16/2021)   Received from [facility], [facility]   [facility] - Transportation    Lack of Transportation (Medical): No    Lack of Transportation (Non-Medical): No  Physical Activity: Insufficiently Active (07/16/2021)   Received from [facility], [facility]   Exercise Vital Sign    Days of Exercise per Week: 2 days    Minutes of Exercise per Session: 20 min  Stress: Stress Concern Present (07/16/2021)   Received from [facility], [facility] of [facility] - Occupational Stress Questionnaire    Feeling of Stress : Rather much  Social Connections:  Socially Integrated (07/16/2021)   Received from [facility], [facility]   Social Connection and Isolation Panel [NHANES]    Frequency of Communication with Friends and Family: More than three times a week    Frequency of Social Gatherings with Friends and Family: More than three times a week    Attends Religious Services: More than 4 times per year    Active Member of [employer] or Organizations: Yes    Attends Engineer, structural: More than 4 times per year    Marital Status: Married  Catering manager Violence: Not At Risk (07/16/2021)   Received from [facility], [employer], Afraid, Rape, and Kick questionnaire    Fear of Current or Ex-Partner: No    Emotionally Abused: No    Physically Abused: No    Sexually Abused: No    Medications:   Current Outpatient Medications on File Prior to Visit  Medication Sig Dispense Refill   aspirin EC 81 MG EC tablet Take 1 tablet (81 mg total) by mouth daily. Swallow whole. 30 tablet 11   FLUoxetine (PROZAC) 10 MG capsule Take 10 mg by mouth every evening.     REPATHA SURECLICK 140 MG/ML SOAJ INJECT 1 PEN INTO SKIN EVERY 14 DAYS 2 mL 11   rosuvastatin (CRESTOR) 20 MG tablet TAKE ONE TABLET DAILY 90 tablet 3   tirzepatide (ZEPBOUND) 10 MG/0.5ML Pen Inject 10 mg into the skin once a week.     No current facility-administered medications on file prior to visit.    Allergies:   Allergies  Allergen Reactions   Penicillins Hives    Has patient had a PCN reaction causing immediate rash, facial/tongue/throat swelling, SOB or lightheadedness with hypotension: YES Has patient had a PCN reaction causing severe rash involving mucus membranes or skin necrosis: NO Has patient had a PCN reaction that required hospitalizationNO Has patient had a PCN reaction occurring within the last 10 years: NO If all of the  above answers are "NO", then may proceed with Cephalosporin use. Has patient had a PCN reaction causing immediate rash, facial/tongue/throat swelling, SOB or lightheadedness with hypotension: YES Has patient had a PCN reaction causing severe rash involving mucus membranes or skin necrosis: NO Has patient had a PCN reaction that required hospitalizationNO Has patient had a PCN reaction occurring within the last 10 years: NO If all of the above answers are "NO", then may proceed with Cephalosporin use.   Propofol     Increased blood pressure and heart rate    Physical Exam General: well developed, well nourished young Caucasian male, seated, in no evident distress.   Head: head normocephalic and atraumatic.  Neck: supple with no carotid or supraclavicular bruits Cardiovascular: regular rate and rhythm, no murmurs Musculoskeletal: no deformity Skin:  no rash/petichiae Vascular:  Normal pulses all extremities Vitals:   02/06/23 1344  BP: (!) 135/94  Pulse: 91   Neurologic Exam Mental Status: Awake and fully alert. Oriented to place and time. Recent and remote memory intact. Attention span, concentration and fund of knowledge appropriate. Mood and affect appropriate.  Cranial Nerves: Fundoscopic exam not done. Pupils equal, briskly reactive to light. Extraocular movements full without nystagmus but slight saccadic dysmetria on left lateral gaze.. Visual fields full to confrontation. Hearing intact.   Face, tongue, palate moves normally and symmetrically.  Motor: Normal bulk and tone. Normal strength in all tested extremity muscles.  Diminished fine finger movements on the left.  Orbits right over left upper extremity.  Sensory.: intact to touch ,pinprick .position and vibratory sensation except mild hyperesthesia over the right lower face. and left body.  Coordination: Rapid alternating movements normal in all extremities. Finger-to-nose and heel-to-shin performed accurately bilaterally. Gait  and Station: Arises from chair without difficulty. Stance is normal. Gait demonstrates normal stride length and balance . Able to heel, toe and tandem walk without difficulty.  Reflexes: 1+ and symmetric. Toes downgoing.      ASSESSMENT: 47 year old Caucasian male with right lateral medullary and cerebellar infarct in December 2018 due to terminal nondominant right vertebral artery occlusion etiology indeterminate dissection versus cryptogenic. Vascular risk factors of small PFO and hyperlipidemia only. His small PFO is likely a innocent bystander Family history of elevated lipoprotein a.  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  I recommend he continue aspirin 81 mg daily for stroke prevention and maintain aggressive risk factor modification with strict control of hypertension with blood pressure goal below 140/90, lipids with LDL cholesterol goal below 55 mg percent( due to elevated lipoprotein a) and diabetes with hemoglobin A1c goal below 6.5%.  He was encouraged to eat a healthy diet with lots of fruits, vegetables, cereals and whole grains.  He was encouraged to be active and exercise regularly and lose weight.  Check follow-up lipid profile, lipoprotein a and hemoglobin A1c.  Continues CPAP for his sleep apnea and follow-up in the sleep clinic with Dr. Frances Furbish to help troubleshoot his problems with CPAP mask..  Return for follow-up in the future in 1 year or call earlier if necessary. Greater than 50% of time during this 35 minute visit was spent on counseling,explanation of diagnosis vertebral artery occlusion, brainstem stroke, paresthesias and tingling, planning of further management, discussion with patient and family and coordination of care Delia Heady,  MD  [facility] [address] [place], [place] [address]  Phone [phone] Fax [phone] Note: This document was prepared with digital dictation and possible smart phrase technology. Any transcriptional errors that result from this process are unintentional

## 2023-02-07 NOTE — Progress Notes (Signed)
Kindly inform the patient that screening test for diabetes and lipid profile is quite satisfactory.  Lipoprotein analysis is not back yet.

## 2023-02-09 LAB — LIPOPROTEIN ANALYSIS BY NMR
HDL Particle Number: 62.1 umol/L (ref >=30.5)
LP-IR Score: 25 (ref <=45)

## 2023-02-09 LAB — HEMOGLOBIN A1C
Est. average glucose Bld gHb Est-mCnc: 111 mg/dL
Hgb A1c MFr Bld: 5.5 % (ref 4.8–5.6)

## 2023-02-09 LAB — LIPID PANEL
Chol/HDL Ratio: 1.5 {ratio} (ref 0.0–5.0)
Cholesterol, Total: 181 mg/dL (ref 100–199)
HDL: 119 mg/dL (ref >39)
LDL Chol Calc (NIH): 49 mg/dL (ref 0–99)
Triglycerides: 72 mg/dL (ref 0–149)
VLDL Cholesterol Cal: 13 mg/dL (ref 5–40)

## 2023-02-10 ENCOUNTER — Telehealth: Payer: Self-pay | Admitting: Anesthesiology

## 2023-02-10 NOTE — Telephone Encounter (Signed)
-----   Message from Delia Heady sent at 02/07/2023  9:29 AM EDT ----- Joneen Roach inform the patient that screening test for diabetes and lipid profile is quite satisfactory.  Lipoprotein analysis is not back yet.

## 2023-02-12 ENCOUNTER — Other Ambulatory Visit (HOSPITAL_BASED_OUTPATIENT_CLINIC_OR_DEPARTMENT_OTHER): Payer: Self-pay | Admitting: Internal Medicine

## 2023-02-23 NOTE — Progress Notes (Signed)
Kindly inform the patient that cholesterol profile and screening test for diabetes are both quite satisfactory.

## 2023-02-27 DIAGNOSIS — G4733 Obstructive sleep apnea (adult) (pediatric): Secondary | ICD-10-CM | POA: Diagnosis not present

## 2023-03-06 DIAGNOSIS — Z125 Encounter for screening for malignant neoplasm of prostate: Secondary | ICD-10-CM | POA: Diagnosis not present

## 2023-03-06 DIAGNOSIS — E785 Hyperlipidemia, unspecified: Secondary | ICD-10-CM | POA: Diagnosis not present

## 2023-03-13 DIAGNOSIS — Z1331 Encounter for screening for depression: Secondary | ICD-10-CM | POA: Diagnosis not present

## 2023-03-13 DIAGNOSIS — R82998 Other abnormal findings in urine: Secondary | ICD-10-CM | POA: Diagnosis not present

## 2023-03-13 DIAGNOSIS — I6509 Occlusion and stenosis of unspecified vertebral artery: Secondary | ICD-10-CM | POA: Diagnosis not present

## 2023-03-13 DIAGNOSIS — Z23 Encounter for immunization: Secondary | ICD-10-CM | POA: Diagnosis not present

## 2023-03-13 DIAGNOSIS — Z0000 Encounter for general adult medical examination without abnormal findings: Secondary | ICD-10-CM | POA: Diagnosis not present

## 2023-03-13 DIAGNOSIS — Z1339 Encounter for screening examination for other mental health and behavioral disorders: Secondary | ICD-10-CM | POA: Diagnosis not present

## 2023-03-29 DIAGNOSIS — G4733 Obstructive sleep apnea (adult) (pediatric): Secondary | ICD-10-CM | POA: Diagnosis not present

## 2023-04-29 DIAGNOSIS — G4733 Obstructive sleep apnea (adult) (pediatric): Secondary | ICD-10-CM | POA: Diagnosis not present

## 2023-07-15 ENCOUNTER — Encounter: Payer: Self-pay | Admitting: Neurology

## 2023-07-15 NOTE — Telephone Encounter (Signed)
 He was most recently seen by Dr. Pearlean Brownie, this should go to Dr. Pearlean Brownie for further recommendations.  Thank you.

## 2023-08-15 DIAGNOSIS — R197 Diarrhea, unspecified: Secondary | ICD-10-CM | POA: Diagnosis not present

## 2023-08-15 DIAGNOSIS — E785 Hyperlipidemia, unspecified: Secondary | ICD-10-CM | POA: Diagnosis not present

## 2023-08-19 ENCOUNTER — Encounter: Payer: Self-pay | Admitting: *Deleted

## 2023-08-21 DIAGNOSIS — F4322 Adjustment disorder with anxiety: Secondary | ICD-10-CM | POA: Diagnosis not present

## 2023-08-28 DIAGNOSIS — G4733 Obstructive sleep apnea (adult) (pediatric): Secondary | ICD-10-CM | POA: Diagnosis not present

## 2023-09-02 DIAGNOSIS — F4322 Adjustment disorder with anxiety: Secondary | ICD-10-CM | POA: Diagnosis not present

## 2023-09-09 DIAGNOSIS — F4322 Adjustment disorder with anxiety: Secondary | ICD-10-CM | POA: Diagnosis not present

## 2023-09-10 DIAGNOSIS — F4322 Adjustment disorder with anxiety: Secondary | ICD-10-CM | POA: Diagnosis not present

## 2023-09-13 ENCOUNTER — Encounter: Payer: Self-pay | Admitting: Neurology

## 2023-09-17 DIAGNOSIS — F4322 Adjustment disorder with anxiety: Secondary | ICD-10-CM | POA: Diagnosis not present

## 2023-09-23 DIAGNOSIS — F4322 Adjustment disorder with anxiety: Secondary | ICD-10-CM | POA: Diagnosis not present

## 2023-09-28 DIAGNOSIS — G4733 Obstructive sleep apnea (adult) (pediatric): Secondary | ICD-10-CM | POA: Diagnosis not present

## 2023-10-01 DIAGNOSIS — F4322 Adjustment disorder with anxiety: Secondary | ICD-10-CM | POA: Diagnosis not present

## 2023-10-07 DIAGNOSIS — F4322 Adjustment disorder with anxiety: Secondary | ICD-10-CM | POA: Diagnosis not present

## 2023-11-07 DIAGNOSIS — F4322 Adjustment disorder with anxiety: Secondary | ICD-10-CM | POA: Diagnosis not present

## 2023-11-12 DIAGNOSIS — F4322 Adjustment disorder with anxiety: Secondary | ICD-10-CM | POA: Diagnosis not present

## 2023-11-18 DIAGNOSIS — F4322 Adjustment disorder with anxiety: Secondary | ICD-10-CM | POA: Diagnosis not present

## 2023-11-19 DIAGNOSIS — F4322 Adjustment disorder with anxiety: Secondary | ICD-10-CM | POA: Diagnosis not present

## 2023-11-28 DIAGNOSIS — F4322 Adjustment disorder with anxiety: Secondary | ICD-10-CM | POA: Diagnosis not present

## 2023-12-09 DIAGNOSIS — F4322 Adjustment disorder with anxiety: Secondary | ICD-10-CM | POA: Diagnosis not present

## 2023-12-19 DIAGNOSIS — F4322 Adjustment disorder with anxiety: Secondary | ICD-10-CM | POA: Diagnosis not present

## 2023-12-24 ENCOUNTER — Other Ambulatory Visit: Payer: Self-pay | Admitting: Internal Medicine

## 2023-12-24 DIAGNOSIS — E7841 Elevated Lipoprotein(a): Secondary | ICD-10-CM

## 2023-12-24 DIAGNOSIS — E785 Hyperlipidemia, unspecified: Secondary | ICD-10-CM

## 2023-12-24 DIAGNOSIS — Z8673 Personal history of transient ischemic attack (TIA), and cerebral infarction without residual deficits: Secondary | ICD-10-CM

## 2023-12-26 DIAGNOSIS — F4322 Adjustment disorder with anxiety: Secondary | ICD-10-CM | POA: Diagnosis not present

## 2024-01-01 DIAGNOSIS — F4322 Adjustment disorder with anxiety: Secondary | ICD-10-CM | POA: Diagnosis not present

## 2024-01-09 DIAGNOSIS — F4322 Adjustment disorder with anxiety: Secondary | ICD-10-CM | POA: Diagnosis not present

## 2024-01-12 ENCOUNTER — Ambulatory Visit (INDEPENDENT_AMBULATORY_CARE_PROVIDER_SITE_OTHER): Admitting: Neurology

## 2024-01-12 ENCOUNTER — Encounter: Payer: Self-pay | Admitting: Neurology

## 2024-01-12 VITALS — BP 119/84 | HR 71 | Ht 70.0 in | Wt 221.6 lb

## 2024-01-12 DIAGNOSIS — G4734 Idiopathic sleep related nonobstructive alveolar hypoventilation: Secondary | ICD-10-CM | POA: Diagnosis not present

## 2024-01-12 DIAGNOSIS — G4733 Obstructive sleep apnea (adult) (pediatric): Secondary | ICD-10-CM | POA: Diagnosis not present

## 2024-01-12 NOTE — Patient Instructions (Signed)
 Please continue using your autoPAP regularly. While your insurance requires that you use PAP at least 4 hours each night on 70% of the nights, I recommend, that you not skip any nights and use it throughout the night if you can. Getting used to PAP and staying with the treatment long term does take time and patience and discipline. Untreated obstructive sleep apnea when it is moderate to severe can have an adverse impact on cardiovascular health and raise her risk for heart disease, arrhythmias, hypertension, congestive heart failure, stroke and diabetes. Untreated obstructive sleep apnea causes sleep disruption, nonrestorative sleep, and sleep deprivation. This can have an impact on your day to day functioning and cause daytime sleepiness and impairment of cognitive function, memory loss, mood disturbance, and problems focussing. Using PAP regularly can improve these symptoms.   As discussed, we will do an overnight oxygen level test, called ONO, and your DME company will call and set this up for one night, while you also use your autoPAP as usual. We will call you with the results. This is to make sure that your oxygen levels stay in the 90s, while you are treated with autoPAP for your OSA.

## 2024-01-12 NOTE — Progress Notes (Signed)
ONO order sent to [employer].  

## 2024-01-12 NOTE — Progress Notes (Signed)
 Subjective:    Patient ID: Jake Moore is a 48 y.o. male.  HPI    Interim history:    Jake Moore is a 48 year old right-handed gentleman with an underlying medical history of stroke in 2018 and 03/2021, L vertebral artery dissection (followed by Dr. Rosemarie and Harlene Bogaert, NP), hyperlipidemia, anxiety, hypothyroidism, PFO, prior smoking and mild obesity, who presents for follow up consultation of his OSA. The patient is unaccompanied today and presents after a long gap of over 2 years.  He was last seen in for his sleep apnea follow-up in a video visit with Harlene Bogaert, NP in June 2023, at which time he was compliant with his AutoPap.  He had recently received a new machine in March 2023.  Today, 01/12/2024: I reviewed his AutoPap compliance data from 12/09/2023 through 01/07/2024, which is a total of 30 days, during which time he used his machine every night with percent use days greater than 4 hours at 100%, indicating superb compliance with an average usage of 7 hours and 54 minutes, residual AHI at goal at 1.8/h, 95th percentile of pressure at 10.4 cm with a range of 5 to 20 cm with EPR of 3.  Leak on the higher side with a time significant fluctuation, 95th percentile at 28.2 L/min.  He reports compliance with his AutoPap.  He has no issues tolerating it, but does have some residual daytime somnolence, Epworth sleepiness score is 15 out of 24.  He has cut out alcohol  in the past 2 months and feels that that has helped a little bit.  He does drink quite a bit of caffeine in the form of diet soda, about 2 to 3 cans/day.  He is working on weight loss.  He was on Zepbound but could not tolerate it.  He is working with a Psychologist, educational.  The patient's allergies, current medications, family history, past medical history, past social history, past surgical history and problem list were reviewed and updated as appropriate.   Previously:    10/04/2021 (JM): Patient is being seen via MyChart video  visit for initial CPAP follow-up after prior visit on 06/28/2021 with Dr. Buck.  He received new CPAP machine on 07/19/2021.  Review of the past 30 day compliance report as below.    Reports he has been doing well on his CPAP and tolerating it without difficulty but has been having issues with it syncing. He feels like he has had great benefit since starting his CPAP.  Believes there has been improvement with his energy levels and sleeping better.  Continues to follow with DME company [employer].  He has no questions or concerns at this time.  06/28/2021 (SA): He reports that he has had more snoring and witnessed apneas and gasping sensation since he has been sleeping on his back since his last stroke and left vertebral dissection.  He is scheduled to go to the [facility] for further evaluation because of a strong family history of cardiovascular disease at a younger age.  He got his AutoPap machine in early 2021 and shortly after he started using it he came down with a COVID infection and after he recuperated he never got back into the habit of using his AutoPap.  He lost insurance coverage for the machine and gave it back around May 2021.  He is interested in restarting his AutoPap.  He has had some weight fluctuation.  He is currently working on weight loss.  He lives with his family, he  has caffeine in the form of diet soda, 2 to 3 cans/day, alcohol  about 2 glasses of wine per week.  He quit smoking in 2017.  Bedtime is generally around 10, rise time around 7.  His Epworth sleepiness score is 15 out of 24, fatigue severity score is 24 out of 63.  He does not have night to night nocturia or recurrent morning headaches.   I first met him at the request of his primary care physician on 04/12/2019, at which time he reported snoring and daytime somnolence as well as witnessed apneas.     He had a home sleep test on 04/28/2019 which showed severe obstructive sleep apnea (by number of events) with a total AHI  of 54.8/hour and O2 nadir of 88%.  The patient was advised to start AutoPap therapy.  He tried the machine for a few days but after he developed COVID, he did not go back to using his AutoPap and eventually lost insurance coverage for it.  He did not follow-up in sleep clinic after that.   04/12/19: (He) reports snoring and excessive daytime somnolence, weight gain, and apneic breathing pauses while asleep.  I reviewed your telemedicine note from 03/04/2019. His blood work through your office on 02/25/2019 showed a mildly elevated TSH at 4.76.  His Epworth sleepiness score is 18 out of 24, fatigue severity score is 20 out of 63.  His wife has noted apneic pauses while he is asleep.  He has had weight gain since his stroke in 2018, in the realm of 40 pounds.  He is currently working with a Systems analyst.  He is also working on his diet.  He is currently on a gluten-free diet.  He quit drinking alcohol , was never a heavy drinker.  He quit smoking about 5 years ago was an intermittent smoker and also more regular smoker back in college.  His father has a diagnosis of sleep apnea but no longer uses a CPAP machine.  He has a family history of vascular disease.  He denies recurrent morning headaches or night to night nocturia.  He is typically in bed by 11 and rise time is around 7.  He has a TV in the bedroom but turns it off at night.  He has 1 dog in the household, the dog typically sleeps on the bed with them.  He lives with his wife and 3 children ages 67, 20 and 47.  He works in Clinical research associate.  He has paresthesias in the left hemibody since the stroke but otherwise recuperated well, balance and coordination improved.  His Past Medical History Is Significant For: Past Medical History:  Diagnosis Date   Anxiety    CVA (cerebral vascular accident) (HCC)    History of multiple strokes    HLD (hyperlipidemia)     His Past Surgical History Is Significant For: Past Surgical History:  Procedure  Laterality Date   TEE WITHOUT CARDIOVERSION N/A 04/14/2017   Procedure: TRANSESOPHAGEAL ECHOCARDIOGRAM (TEE);  Surgeon: Cherrie Toribio SAUNDERS, MD;  Location: [facility] ENDOSCOPY;  Service: Cardiovascular;  Laterality: N/A;    His Family History Is Significant For: Family History  Problem Relation Age of Onset   Other Sister        hypercoagulable state with SCAD   CAD Maternal Grandfather    Sleep apnea Father     His Social History Is Significant For: Social History   Socioeconomic History   Marital status: Single    Spouse name: Not on file  Number of children: Not on file   Years of education: Not on file   Highest education level: Not on file  Occupational History   Not on file  Tobacco Use   Smoking status: Former    Current packs/day: 0.00    Types: Cigarettes    Start date: 01/15/1996    Quit date: 01/15/2016    Years since quitting: 7.9   Smokeless tobacco: Former    Types: Associate Professor status: Never Used  Substance and Sexual Activity   Alcohol  use: Yes    Alcohol /week: 14.0 standard drinks of alcohol     Types: 14 Glasses of wine per week    Comment: social 10  beers per week   Drug use: No   Sexual activity: Yes  Other Topics Concern   Not on file  Social History Narrative   Pt lives with family    Pt works    Social Drivers of Corporate investment banker Strain: Low Risk  (07/16/2021)   Received from [facility]   Overall Financial Resource Strain (CARDIA)    Difficulty of Paying Living Expenses: Not hard at all  Food Insecurity: No Food Insecurity (07/16/2021)   Received from [facility]   Hunger Vital Sign    Worried About Running Out of Food in the Last Year: Never true    Ran Out of Food in the Last Year: Never true  Transportation Needs: No Transportation Needs (07/16/2021)   Received from [facility] - Transportation    Lack of Transportation (Medical): No    Lack of Transportation (Non-Medical): No  Physical Activity:  Insufficiently Active (07/16/2021)   Received from [facility]   Exercise Vital Sign    Days of Exercise per Week: 2 days    Minutes of Exercise per Session: 20 min  Stress: Stress Concern Present (07/16/2021)   Received from [facility] - Occupational Stress Questionnaire    Feeling of Stress : Rather much  Social Connections: Socially Integrated (07/16/2021)   Received from [facility]   Social Connection and Isolation Panel    Frequency of Communication with Friends and Family: More than three times a week    Frequency of Social Gatherings with Friends and Family: More than three times a week    Attends Religious Services: More than 4 times per year    Active Member of [employer] or Organizations: Yes    Attends Engineer, structural: More than 4 times per year    Marital Status: Married    His Allergies Are:  Allergies  Allergen Reactions   Penicillins Hives    Has patient had a PCN reaction causing immediate rash, facial/tongue/throat swelling, SOB or lightheadedness with hypotension: YES Has patient had a PCN reaction causing severe rash involving mucus membranes or skin necrosis: NO Has patient had a PCN reaction that required hospitalizationNO Has patient had a PCN reaction occurring within the last 10 years: NO If all of the above answers are NO, then may proceed with Cephalosporin use. Has patient had a PCN reaction causing immediate rash, facial/tongue/throat swelling, SOB or lightheadedness with hypotension: YES Has patient had a PCN reaction causing severe rash involving mucus membranes or skin necrosis: NO Has patient had a PCN reaction that required hospitalizationNO Has patient had a PCN reaction occurring within the last 10 years: NO If all of the above answers are NO, then may proceed with Cephalosporin  use.   Propofol      Increased blood pressure and heart rate  :   His Current Medications Are:  Outpatient  Encounter Medications as of 01/12/2024  Medication Sig   aspirin  EC 81 MG EC tablet Take 1 tablet (81 mg total) by mouth daily. Swallow whole.   Evolocumab  (REPATHA  SURECLICK) 140 MG/ML SOAJ INJECT ONE pen into SKIN EVERY 14 DAYS   FLUoxetine  (PROZAC ) 10 MG capsule Take 10 mg by mouth every evening.   rosuvastatin  (CRESTOR ) 20 MG tablet TAKE ONE TABLET DAILY   tirzepatide (ZEPBOUND) 10 MG/0.5ML Pen Inject 10 mg into the skin once a week. (Patient not taking: Reported on 01/12/2024)   No facility-administered encounter medications on file as of 01/12/2024.  :  Review of Systems:  Out of a complete 14 point review of systems, all are reviewed and negative with the exception of these symptoms as listed below:  Review of Systems  Neurological:        Pt here for cpap f/u Pt states unable take naps without cpap machine    ESS:15     Objective:  Neurological Exam  Physical Exam Physical Examination:   Vitals:   01/12/24 0745  BP: 119/84  Pulse: 71    General Examination: The patient is a very pleasant 48 y.o. male in no acute distress. He appears well-developed and well-nourished and well groomed.   HEENT: Normocephalic, atraumatic, pupils are equal, round and reactive to light, extraocular tracking is well-preserved, face is symmetric with normal facial animation, speech is clear without dysarthria, hypophonia or voice tremor.  Neck shows full range of motion.  No carotid bruits.  Airway examination reveals mild to moderate mouth dryness, adequate dental hygiene, mild airway crowding. Tongue protrudes centrally and palate elevates symmetrically.   Chest: Clear to auscultation without wheezing, rhonchi or crackles noted.   Heart: S1+S2+0, regular and normal without murmurs, rubs or gallops noted.    Abdomen: Soft, non-tender and non-distended.   Extremities: There is no pitting edema in the distal lower extremities bilaterally.    Skin: Warm and dry without trophic changes noted.     Musculoskeletal: exam reveals no obvious joint deformities.    Neurologically:  Mental status: The patient is awake, alert and oriented in all 4 spheres. His immediate and remote memory, attention, language skills and fund of knowledge are appropriate. There is no evidence of aphasia, agnosia, apraxia or anomia. Speech is clear with normal prosody and enunciation. Thought process is linear. Mood is normal and affect is normal.  Cranial nerves II - XII are as described above under HEENT exam.  Motor exam: Normal bulk, moving all 4 extremities without limitation, no obvious resting or action tremor.   Fine motor skills and coordination: grossly intact.  Cerebellar testing: No dysmetria or intention tremor. Finger-to-nose is good bilaterally. There is no truncal or gait ataxia.  Sensory exam: intact to light touch.  Gait, station and balance: He stands easily. No veering to one side is noted. No leaning to one side is noted. Posture is age-appropriate and stance is narrow based. Gait shows normal stride length and normal pace. No problems turning are noted.   Assessment and Plan:  In summary, KAMARII CARTON is a 48 year old right-handed gentleman with an underlying medical history of stroke in 2018 and 03/2021, L vertebral artery dissection (followed by Dr. Rosemarie and Harlene Bogaert, NP), hyperlipidemia, anxiety, hypothyroidism, PFO, prior smoking and mild obesity, who presents for follow up consultation of his OSA.  He is compliant with his AutoPap with good apnea scores.  He has residual daytime somnolence and we talked about possible factors that may be at play.  He is Home sleep test in January 2021 showed obstructive sleep apnea in the severe range by AHI criteria, O2 nadir was 88% at the time.  Nevertheless, I would like to proceed with an overnight pulse oximetry test while he is on AutoPap therapy to ensure that his oxygen levels are adequate while he is on treatment, he has had some weight  fluctuation, current weight higher compared to a couple years ago.  He is advised to continue to work on weight loss and be compliant with his AutoPap.  He is commended for his treatment adherence, he uses nasal cushions for his mask.  He is advised that we will call him with his pulse oximetry test result and if all goes well he can follow-up in a year to see Harlene Bogaert, NP.  I will also write for a CPAP supply order.  We can offer him a virtual MyChart video visit if he prefers as well.  I answered all his questions today and he was in agreement.   I spent 30 minutes in total face-to-face time and in reviewing records during pre-charting, more than 50% of which was spent in counseling and coordination of care, reviewing test results, reviewing medications and treatment regimen and/or in discussing or reviewing the diagnosis of OSA, the prognosis and treatment options. Pertinent laboratory and imaging test results that were available during this visit with the patient were reviewed by me and considered in my medical decision making (see chart for details).

## 2024-01-12 NOTE — Progress Notes (Signed)
 Sue Lush, RN; Kathyrn Sheriff; Santina Evans; Jeris Penta, [place]; 1 other Received, Thank you

## 2024-01-22 ENCOUNTER — Telehealth (HOSPITAL_BASED_OUTPATIENT_CLINIC_OR_DEPARTMENT_OTHER): Payer: Self-pay | Admitting: Nurse Practitioner

## 2024-01-22 DIAGNOSIS — E7841 Elevated Lipoprotein(a): Secondary | ICD-10-CM

## 2024-01-22 DIAGNOSIS — E785 Hyperlipidemia, unspecified: Secondary | ICD-10-CM

## 2024-01-22 DIAGNOSIS — Z5181 Encounter for therapeutic drug level monitoring: Secondary | ICD-10-CM

## 2024-01-22 NOTE — Telephone Encounter (Signed)
 Left message to call back and sent mychart message, order placed

## 2024-01-22 NOTE — Telephone Encounter (Signed)
 Please order NMR

## 2024-01-22 NOTE — Telephone Encounter (Signed)
  Patient need new lipid labs order, he is scheduled lipid visit on 01/28/24 with Rosaline Bane

## 2024-01-22 NOTE — Telephone Encounter (Signed)
 Will forward to Eula Fried NP for review

## 2024-01-26 NOTE — Progress Notes (Deleted)
 Cardiology Office Note   Date:  01/26/2024  ID:  Jake Moore, DOB 1976/02/09, MRN [id number] PCP: Loreli Elsie JONETTA Mickey., MD  [facility] Providers Cardiologist:  None { Click to update primary MD,subspecialty MD or APP then REFRESH:1}    PMH Dyslipidemia TIA/Stroke Elevated LP(a) Former smoker (quit 2014) Family history ASCVD  Referred to lipid clinic and seen by Duwaine Pointer, [facility] 02/2018 for management of dyslipidemia.  He is a friend of Dr. Rolan and had received recommendations from other cardiologist within the group and seen by Dr. Antonetta at [facility] cardiology.  He unfortunately had a stroke at a young age.  He underwent coronary CT in 2018 with calcium  score of 0.  Family history significant for sister who has hypercoagulable disorder and both patient and sister have significantly elevated LP(a) (385 in November 2019).  Seen by Dr. Mona in 2020 on rosuvastatin  40 mg daily, Eliquis 5 mg twice daily for stroke prevention.  He underwent TEE which showed small PFO.  Dr. 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  He was advised by neurology to take aspirin  alone following stroke.  Clinical trials including an antisense oglionucleatide to LP(a) were currently in phase 3 but there were no research sites in [place].  At follow-up visit 07/08/2019 he reported frustration with not being able to get back to normal exercise and weight gain since stroke.  Medical therapy included Repatha , rosuvastatin  40 mg, aspirin , and Eliquis.  He was being enrolled in a clinical study for LP(a) lowering with pelcarseran, and antisense to LP(a) made by [employer] ([employer]).  Arch 2081  He unfortunately suffered another stroke in December 2022.  He was evaluated at [facility] but workup there was generally unrevealing.  It did rule out connective tissue disorder.  LP(a) improved  to stay initially on therapy, down to 226 nmol/L.  At office visit 04/30/2022 total cholesterol 113, triglycerides 63, HDL 71, and LDL 49 on combination therapy Repatha  and high-dose rosuvastatin  20 mg daily.  He was also on low-dose aspirin .  It was felt that long-term anticoagulation was not supported by sufficient evidence and therefore he had discontinued it.  He was also started on Wegovy for weight loss.  There were some safety issues on the horizon trial and he was apparently on placebo.  Lipid panel 02/06/2023 revealed LDL particle number < 300, small LDL-P < 90, total cholesterol 181, triglycerides 72, HDL 119, and LDL-C 49.  A1c was 5.5%.  History of Present Illness BORUCH Moore is a 48 y.o. male ***  ROS: ***  Studies Reviewed      ***  No results found for: LIPOA  Risk Assessment/Calculations {Does this patient have ATRIAL FIBRILLATION?:[id number]} No BP recorded.  {Refresh Note OR Click here to enter BP  :1}***       Physical Exam VS:  There were no vitals taken for this visit.   Wt Readings from Last 3 Encounters:  01/12/24 221 lb 9.6 oz (100.5 kg)  02/06/23 203 lb (92.1 kg)  04/30/22 198 lb (89.8 kg)    GEN: Well nourished, well developed in no acute distress NECK: No JVD; No carotid bruits CARDIAC: ***RRR, no murmurs, rubs, gallops RESPIRATORY:  Clear to auscultation without rales, wheezing or rhonchi  ABDOMEN: Soft, non-tender, non-distended EXTREMITIES:  No edema; No deformity   ASSESSMENT AND PLAN ***    {Are you ordering  a CV Procedure (e.g. stress test, cath, DCCV, TEE, etc)?   Press F2        :[id number]}  Dispo: ***  Signed, Rosaline Bane, NP-C

## 2024-01-27 ENCOUNTER — Encounter (HOSPITAL_BASED_OUTPATIENT_CLINIC_OR_DEPARTMENT_OTHER): Payer: Self-pay

## 2024-01-27 DIAGNOSIS — G4733 Obstructive sleep apnea (adult) (pediatric): Secondary | ICD-10-CM | POA: Diagnosis not present

## 2024-01-28 ENCOUNTER — Encounter (HOSPITAL_BASED_OUTPATIENT_CLINIC_OR_DEPARTMENT_OTHER): Admitting: Nurse Practitioner

## 2024-01-28 DIAGNOSIS — F4322 Adjustment disorder with anxiety: Secondary | ICD-10-CM | POA: Diagnosis not present

## 2024-02-03 DIAGNOSIS — F4322 Adjustment disorder with anxiety: Secondary | ICD-10-CM | POA: Diagnosis not present

## 2024-02-04 DIAGNOSIS — F4322 Adjustment disorder with anxiety: Secondary | ICD-10-CM | POA: Diagnosis not present

## 2024-02-05 ENCOUNTER — Encounter: Payer: Self-pay | Admitting: Neurology

## 2024-02-05 ENCOUNTER — Ambulatory Visit: Payer: BC Managed Care – PPO | Admitting: Neurology

## 2024-02-05 VITALS — BP 119/79 | HR 100 | Ht 70.0 in | Wt 223.2 lb

## 2024-02-05 DIAGNOSIS — Z8673 Personal history of transient ischemic attack (TIA), and cerebral infarction without residual deficits: Secondary | ICD-10-CM | POA: Diagnosis not present

## 2024-02-05 DIAGNOSIS — I6501 Occlusion and stenosis of right vertebral artery: Secondary | ICD-10-CM | POA: Diagnosis not present

## 2024-02-05 DIAGNOSIS — R635 Abnormal weight gain: Secondary | ICD-10-CM | POA: Diagnosis not present

## 2024-02-05 NOTE — Patient Instructions (Signed)
 I had a long discussion with the patient regarding his left vertebral artery dissection and remote strokes and is doing well..  I recommend he continue aspirin  81 mg daily for stroke prevention and maintain aggressive risk factor modification with strict control of hypertension with blood pressure goal below 140/90, lipids with LDL cholesterol goal below 55 mg percent( due to elevated lipoprotein a) and diabetes with hemoglobin A1c goal below 6.5%.  He was encouraged to eat a healthy diet with lots of fruits, vegetables, cereals and whole grains.  He was encouraged to be active and exercise regularly and lose weight.  .  Continue CPAP for his sleep apnea and follow-up in the sleep clinic with Dr. Buck  Check thyroid  profile and cortisol level for his weigh gain..  Return for follow-up in the future in 1 year or call earlier if necessary.

## 2024-02-05 NOTE — Progress Notes (Signed)
 [facility] [address] [place]. [place] [address] [phone]       OFFICE FOLLOW-UP NOTE  Jake. Jake Moore Date of Birth:  [birth year] Medical Record Number:  [id number]   HPI: Jake Moore is a present 48 year old Caucasian male who is seen today for first office follow-up visit following hospital admission for stroke in December 2018. He is accompanied and his wife. History is obtained from the patient, wife and review of electronic medical records. I have personally reviewed imaging for lemons as well as his records from [facility] and [facility] in care everywhereRobert F Moore is an 48 y.o. male with HLD family history  significant for blood clots, prior tobacco use presents to the emergency room after being discharged yesterday for worsening dizziness, gait imbalance nausea headache and progressive of right-sided facial numbness. She came with same symptoms yesterday that started at 8:30 in the morning. During that visit his CT head was done and patient was given meclizine . Reviewing the note is unclear if the patient was made to walk prior to discharge. Patient states that symptoms worsened yesterday at 11 PM and numbness over his face extended from nose to the right half of his face. He states that he lifted weights the night before but denies recent chiropractic manipulation, twisting of his neck. He does complain of neck pain on the left side. His sister and mother had multiple clots. His sister event extensive evaluation including at [facility] and [facility] known to have higher levels of factor VIII and elevated lipoprotein A levels. He takes crestor  at home, not on ASA.  Date last known well: 12.20.18.Time last known well: 8.15 am.tPA Given: outside window.NIHSS: 1.Baseline MRS 0  CT scan showed no acute abnormality. 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 Otherwise CTA of the neck and brain was unremarkable. MRI scan  of the brain showed right lateral medullary as well as tiny right cerebellar infarct. Transthoracic echo showed normal ejection fraction. Transesophageal echo showed normal cardia Embolism but Showed a Small Right to Left Shunt.Transcranial Doppler Bubble Study Was Also Positive for a Small Right to Left Shunt Only.lupus anticoagulant by LA-PTT and DRVVT negative. Anticardiolipin antibodies negative. Anti-beta-2  glycoprotein 1 antibodies negative. Homocystine 7.3. HDL 71, LDL 57. Factor V Leiden and prothrombin 79789 mutations not present. Antithrombin activity, protein C activity and protein C antigen normal. Total protein S antigen and protein S activity normal. Sedimentation rate 1. C-reactive protein negative.  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 His sister had a DVT and had age of 80 who was known to have elevated factor VIII levels. The patient's factor VIII level was normal at 146 on 04/21/2007 and on 04/21/2014 it was minimally elevated at 175 which was clinically not significant..  Prothrombin Gene and Factor V Leiden Mutations Were Negative. Lower extremity venous Dopplers were negative for DVT. Hemoglobin A1c was 5.2. LDL cholesterol was 57 mg percent. Patient was started on dual antiplatelet therapy of aspirin  and Plavix  after being started initially on heparin  for a few days. The etiology of patient's stroke was indeterminate patient denied significant neck pain or any physical exertion to suggest dissection at that time. He agreed to proceed in the [facility]. Feels transferred to inpatient rehabilitation but did well and is currently at all. He is subsequently had a second neurological opinion at [facility] neurology with Dr. Ozell Moore as well  as hematologist' Dr Jake Moore for family h/o elevated  factor 8 level. Patient was continued on aspirin  and Plavix  and not commended long-term decompression. Patient states his balance and gait have improved his has  some intermittent numbness in the left side of the face as well as some transient vertigo. He in fact was readmitted on 05/29/16 because of transient positional vertigo which resolved shortly after admission. This was felt to be peripheral positional vertigo. 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 Patient was seen by physical therapist advised him to vascular stabilization exercises as well as continue aspirin  and Plavix  and Crestor . Patient states his done well since discharge. No longer feeling dizzy. His been complaining of posterior neck pain and headaches and physical therapies have started dry needling is helping. He had a outpatient heart monitor done at [facility] on 05/19/17 which did not show any evidence of paroxysmal atrial fibrillation. He has in psychiatrist Dr. Vincente for his anxiety and ADHD and she is prescribed Celexa but he has not started it yet. The patient has started going back to work but states that he gets tired easily. Is planning a trip to the [place] and he wants to start exercising and increasing his physical activity gradually. Is tolerating aspirin  and Plavix  without bruising or bleeding. Is tolerating vascular muscle aches and pains. His blood pressure well controlled. It is 120/76. His considering starting PCS 9 iinhibotor injections for his elevated lipoprotein a and family history of premature coronary artery disease and his insurance has denied this and he will try to get samples from his cardiologist    Update 12/16/2017 : He returns follow-up after last visit 6 months ago. He continues to do well from the stroke standpoint without recurrent stroke or TIA symptoms. He still has occasional numbness on his face and body but it is not bothersome. He has been started on eliquis by his cardiologist from [facility] for elevated lipoprotein.a  He also seems to be  taking a baby aspirin . Hishas not had any major bruising or bleeding episodes. He states his gained some weight and plans to exercise and lose it off. He states his posterior neck pain and headaches are much better. He was initially started on Zoloft by his primary physician which helped but he had some GI issues and more recently has been switched to Pristiq. His blood pressure is well controlled. He is tolerating Crestor  well without any side effects. His insurance company denied Repatha  injections for his elevated lipoprotein.  Update 10/19/2019 : He returns for follow-up after last visit with me nearly 2 years ago.  He is doing well and has not had recurrent stroke or TIA symptoms.  He was concerned as recently started some worsening of his post stroke paresthesias on the right face as well as some in the left hand.  Occasionally is also having intermittent tingling of his fingertips.  He does admit to significant increasing stress recently because of his work as well as starting a new diet as well as exercise program.  Is also had a medication change.  He has been participating in the new lipoprotein a trial with Dr. Ladona being done by [employer].  His platelet count has started going down which has been watched carefully.  He was previously on Eliquis started by his cardiologist at [facility] but used to forget the second dose hence he was switched  to Xarelto  which she takes in the morning with a protein shake only.  Starting it fairly well with only minor bruising and no bleeding.  He has also been switched to Repatha  and Crestor  dose has been reduced from 40 mg to 5 mg daily.  His last lipid profile had shown improvement in his lipoprotein a levels as well as lipid profile but it is still not in the normal range.  He had one episode of panic attack recently.  He had obtained substantial benefit in the past with cognitive behavioral therapy and he plans to call his therapist and see him soon. Update 04/04/2021 ;  Patient is worked into the schedule emergently as he was recently hospitalized with another stroke.  He presented on 03/22/2021 when he woke up in the morning to let his dog out he rolled over on his side and noticed a sudden ringing sound in his ears.  He also noticed numbness in the right side of the body.  His wife called EMS and by the time they arrived he noticed right lower facial weakness and slurred speech.  Code stroke was called.  CT head on admission was unremarkable CT angiogram showed left vertebral artery dissection in the V3 segment with near occlusion with preserved flow distally.  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  Right V3 segment was patent.  Patient was already on Xarelto  hence was not considered for thrombolysis.  He was put on IV heparin  and treated conservatively and did well and remained stable.  Aspirin  was added and he was placed back on Xarelto .  2D echo showed normal ejection fraction.  Lower extremity venous Doppler was negative for DVT.  LDL cholesterol 59 mg percent.  Hemoglobin A1c was 5.2.  Did well and was discharged with outpatient therapies.  He states he recovered back to his baseline.  He has mild right face and left body paresthesias which is residual from his previous stroke.  He has no residual deficits from the current stroke.  Patient has since called up his friend who is a vascular neurologist at [facility] [place]  and has been referred to [facility] for second opinion and is already spoken to them and scheduled to follow-up with Dr. Delores j[place] February/ March next year.  The patient's previous stroke in February 2019 had also shown irregularity of the right vertebral artery in the V3 segment with reduction in caliber raising possibility of dissection. Update 01/24/2022 : He returns for follow-up after last visit with me in December 2022.  Continues to do very well.  He has still some diminished fine motor skills in the  left hand numbness appears to have improved.  He has had no recurrent stroke or TIA symptoms.  He is currently on aspirin  for stroke prevention tolerating well without any bruising or bleeding.  He had seen Dr. Lamar Delores (neurologist at [facility] in [facility] for second opinion.  Dr. Delores also recommended that he discontinue Xarelto  and stay on aspirin  alone.  He had Jake angiogram of the brain and neck and MRI scan on 07/23/2021 which showed sequelae of chronic left cerebellar infarct and right posterior medullary infarct without any acute findings.  There was complete resolution of the previously seen left vertebral artery dissection significant intracranial vascular abnormality.  His lipoprotein a level was elevated at 405.  He was seen at the cardiovascular clinic at [facility] and they recommended aggressive medical management cholesterol and Repatha  injections dieting and losing  weight and r regular exercising.  He was participating in cardiovascular clinical research trial in [place] with a new medication to lower lipoprotein a levels but discontinued his participation following clinical concerns that were raised in the trial. 02/06/2023 ; he returns for follow-up after last visit a year ago.  He is doing well without recurrent stroke or TIA symptoms.  Continues to have minimum left hand symptoms.  He remains on aspirin  81 mg daily which is tolerating well without side effects.  He is also on cholesterol on Repatha  injections and tolerating them well without side effects however he has not had follow-up lipid profile or lipoprotein a levels checked in more than a year.  He has not been eating healthy and in fact has gained weight and knows he needs to diet and be more active and exercise and lose weight.  He has not had any new health issues.  He has no new complaints. 02/05/2024 ; he returns for follow-up after last visit a year ago.  He states he is doing well.  He has had no recurrent TIA or stroke  symptoms.  He has gained weight and is now up to 220 pounds.  He tried Zepbound injections but had side effects and could not tolerated.  He remains on aspirin  she is tolerating well without bruising or bleeding.  He is also on Repatha  injections for his lipids as well as Crestor .  His last lipid profile on 02/06/2023 was satisfactory with LDL of 49. Hemoglobin A1c was 5.5.  LDL particle number was less than 300.  LP IR score was 25 indicating low insulin resistance His blood pressure is doing good today it is 119/79.  He has no new complaints but is wondering why he is not losing weight.  He is started using a Systems analyst recently.  He does use a CPAP every night for his sleep apnea and does follow-up with Dr. Buck and saw her last month. ROS:   14 system review of systems is positive for weight gain, numbness, tingling, bruising and all other systems negative  PMH:  Past Medical History:  Diagnosis Date   Anxiety    CVA (cerebral vascular accident) (HCC)    History of multiple strokes    HLD (hyperlipidemia)     Social History:  Social History   Socioeconomic History   Marital status: Single    Spouse name: Not on file   Number of children: Not on file   Years of education: Not on file   Highest education level: Not on file  Occupational History   Not on file  Tobacco Use   Smoking status: Former    Current packs/day: 0.00    Types: Cigarettes    Start date: 01/15/1996    Quit date: 01/15/2016    Years since quitting: 8.0   Smokeless tobacco: Former    Types: Associate Professor status: Never Used  Substance and Sexual Activity   Alcohol  use: Yes    Alcohol /week: 14.0 standard drinks of alcohol     Types: 14 Glasses of wine per week    Comment: social 10  beers per week   Drug use: No   Sexual activity: Yes  Other Topics Concern   Not on file  Social History Narrative   Pt lives with family    Pt works    Social Drivers of Corporate investment banker  Strain: Low Risk  (07/16/2021)   Received from [facility]  Overall Financial Resource Strain (CARDIA)    Difficulty of Paying Living Expenses: Not hard at all  Food Insecurity: No Food Insecurity (07/16/2021)   Received from [facility]   Hunger Vital Sign    Worried About Running Out of Food in the Last Year: Never true    Ran Out of Food in the Last Year: Never true  Transportation Needs: No Transportation Needs (07/16/2021)   Received from [facility] - Transportation    Lack of Transportation (Medical): No    Lack of Transportation (Non-Medical): No  Physical Activity: Insufficiently Active (07/16/2021)   [facility]   Exercise Vital Sign    Days of Exercise per Week: 2 days    Minutes of Exercise per Session: 20 min  Stress: Stress Concern Present (07/16/2021)   Received from [facility] - Occupational Stress Questionnaire    Feeling of Stress : Rather much  Social Connections: Socially Integrated (07/16/2021)   Received from [facility]   Social Connection and Isolation Panel    Frequency of Communication with Friends and Family: More than three times a week    Frequency of Social Gatherings with Friends and Family: More than three times a week    Attends Religious Services: More than 4 times per year    Active Member of [employer] or Organizations: Yes    Attends Engineer, structural: More than 4 times per year    Marital Status: Married  Catering manager Violence: Not At Risk (07/16/2021)   Received from [facility]   Humiliation, Afraid, Rape, and Kick questionnaire    Fear of Current or Ex-Partner: No    Emotionally Abused: No    Physically Abused: No    Sexually Abused: No    Medications:   Current Outpatient Medications on File Prior to Visit  Medication Sig Dispense Refill   aspirin  EC 81 MG EC tablet Take 1 tablet (81 mg total) by mouth daily. Swallow whole. 30 tablet 11   Evolocumab  (REPATHA   SURECLICK) 140 MG/ML SOAJ INJECT ONE pen into SKIN EVERY 14 DAYS 2 mL 0   FLUoxetine  (PROZAC ) 10 MG capsule Take 10 mg by mouth every evening.     levothyroxine (SYNTHROID) 50 MCG tablet Take 50 mcg by mouth daily before breakfast.     LORazepam  (ATIVAN ) 0.5 MG tablet Take 0.5 mg by mouth as needed.     rosuvastatin  (CRESTOR ) 20 MG tablet TAKE ONE TABLET DAILY 90 tablet 3   tirzepatide (ZEPBOUND) 10 MG/0.5ML Pen Inject 10 mg into the skin once a week. (Patient not taking: Reported on 02/05/2024)     No current facility-administered medications on file prior to visit.    Allergies:   Allergies  Allergen Reactions   Penicillins Hives    Has patient had a PCN reaction causing immediate rash, facial/tongue/throat swelling, SOB or lightheadedness with hypotension: YES Has patient had a PCN reaction causing severe rash involving mucus membranes or skin necrosis: NO Has patient had a PCN reaction that required hospitalizationNO Has patient had a PCN reaction occurring within the last 10 years: NO If all of the above answers are NO, then may proceed with Cephalosporin use. Has patient had a PCN reaction causing immediate rash, facial/tongue/throat swelling, SOB or lightheadedness with hypotension: YES Has patient had a PCN reaction causing severe rash involving mucus membranes or skin necrosis: NO Has patient had a PCN reaction that required hospitalizationNO Has patient had a  PCN reaction occurring within the last 10 years: NO If all of the above answers are NO, then may proceed with Cephalosporin use.   Propofol      Increased blood pressure and heart rate    Physical Exam General: well developed, well nourished young Caucasian male, seated, in no evident distress.   Head: head normocephalic and atraumatic.  Neck: supple with no carotid or supraclavicular bruits Cardiovascular: regular rate and rhythm, no murmurs Musculoskeletal: no deformity Skin:  no rash/petichiae Vascular:  Normal  pulses all extremities Vitals:   02/05/24 1356  BP: 119/79  Pulse: 100  SpO2: 96%   Neurologic Exam Mental Status: Awake and fully alert. Oriented to place and time. Recent and remote memory intact. Attention span, concentration and fund of knowledge appropriate. Mood and affect appropriate.  Cranial Nerves: Fundoscopic exam not done. Pupils equal, briskly reactive to light. Extraocular movements full without nystagmus but slight saccadic dysmetria on left lateral gaze.. Visual fields full to confrontation. Hearing intact.   Face, tongue, palate moves normally and symmetrically.  Motor: Normal bulk and tone. Normal strength in all tested extremity muscles.  Diminished fine finger movements on the left.  Orbits right over left upper extremity. Sensory.: intact to touch ,pinprick .position and vibratory sensation except mild hyperesthesia over the right lower face. and left body.  Coordination: Rapid alternating movements normal in all extremities. Finger-to-nose and heel-to-shin performed accurately bilaterally. Gait and Station: Arises from chair without difficulty. Stance is normal. Gait demonstrates normal stride length and balance . Able to heel, toe and tandem walk without difficulty.  Reflexes: 1+ and symmetric. Toes downgoing.      ASSESSMENT: 48 year old Caucasian male with right lateral medullary and cerebellar infarct in December 2018 due to terminal nondominant right vertebral artery occlusion etiology indeterminate dissection versus cryptogenic. Vascular risk factors of small PFO and hyperlipidemia only. His small PFO is likely a innocent bystander Family history of elevated lipoprotein a.  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  I recommend he  continue aspirin  81 mg daily for stroke prevention and maintain aggressive risk factor modification with strict control of hypertension with blood pressure goal below 140/90, lipids with LDL cholesterol goal below 55 mg percent( due to elevated lipoprotein a) and diabetes with hemoglobin A1c goal below 6.5%.  He was encouraged to eat a healthy diet with lots of fruits, vegetables, cereals and whole grains.  He was encouraged to be active and exercise regularly and lose weight.  .  Continue CPAP for his sleep apnea and follow-up in the sleep clinic with Dr. Buck  Check thyroid  profile and cortisol level for his weigh gain..  Return for follow-up in the future in 1 year or call earlier if necessary.   I personally spent a total of 35 minutes in the care of the patient today including getting/reviewing separately obtained history, performing a medically appropriate exam/evaluation, counseling and educating, placing orders, referring and communicating with other health care professionals, documenting clinical information in the EHR, independently interpreting results, and coordinating care.        Eather Popp, MD  [facility] [address] [place], [place] [address]  Phone [phone] Fax [phone] Note: This document was prepared with digital dictation and possible smart phrase technology. Any transcriptional  errors that result from this process are unintentional

## 2024-02-06 LAB — THYROID PANEL
Free Thyroxine Index: 1.7 (ref 1.2–4.9)
T3 Uptake Ratio: 25 % (ref 24–39)
T4, Total: 6.7 ug/dL (ref 4.5–12.0)

## 2024-02-06 LAB — CORTISOL: Cortisol: 12.2 ug/dL (ref 6.2–19.4)

## 2024-02-13 ENCOUNTER — Ambulatory Visit: Payer: Self-pay | Admitting: Neurology

## 2024-02-13 DIAGNOSIS — F4322 Adjustment disorder with anxiety: Secondary | ICD-10-CM | POA: Diagnosis not present

## 2024-02-19 DIAGNOSIS — F4322 Adjustment disorder with anxiety: Secondary | ICD-10-CM | POA: Diagnosis not present

## 2024-02-25 DIAGNOSIS — F4322 Adjustment disorder with anxiety: Secondary | ICD-10-CM | POA: Diagnosis not present

## 2024-02-27 DIAGNOSIS — M545 Low back pain, unspecified: Secondary | ICD-10-CM | POA: Diagnosis not present

## 2024-02-27 DIAGNOSIS — G4733 Obstructive sleep apnea (adult) (pediatric): Secondary | ICD-10-CM | POA: Diagnosis not present

## 2024-02-27 DIAGNOSIS — M62830 Muscle spasm of back: Secondary | ICD-10-CM | POA: Diagnosis not present

## 2024-03-03 DIAGNOSIS — S39012D Strain of muscle, fascia and tendon of lower back, subsequent encounter: Secondary | ICD-10-CM | POA: Diagnosis not present

## 2024-03-10 DIAGNOSIS — F4322 Adjustment disorder with anxiety: Secondary | ICD-10-CM | POA: Diagnosis not present

## 2024-03-23 DIAGNOSIS — F4322 Adjustment disorder with anxiety: Secondary | ICD-10-CM | POA: Diagnosis not present

## 2024-03-28 DIAGNOSIS — G4733 Obstructive sleep apnea (adult) (pediatric): Secondary | ICD-10-CM | POA: Diagnosis not present

## 2024-03-30 DIAGNOSIS — F410 Panic disorder [episodic paroxysmal anxiety] without agoraphobia: Secondary | ICD-10-CM | POA: Diagnosis not present

## 2024-03-30 DIAGNOSIS — F4010 Social phobia, unspecified: Secondary | ICD-10-CM | POA: Diagnosis not present

## 2024-03-30 DIAGNOSIS — F1011 Alcohol abuse, in remission: Secondary | ICD-10-CM | POA: Diagnosis not present

## 2024-03-31 DIAGNOSIS — F4322 Adjustment disorder with anxiety: Secondary | ICD-10-CM | POA: Diagnosis not present

## 2024-04-06 DIAGNOSIS — F4322 Adjustment disorder with anxiety: Secondary | ICD-10-CM | POA: Diagnosis not present

## 2024-04-08 DIAGNOSIS — F4322 Adjustment disorder with anxiety: Secondary | ICD-10-CM | POA: Diagnosis not present

## 2025-01-13 ENCOUNTER — Telehealth: Admitting: Adult Health

## 2025-02-17 ENCOUNTER — Ambulatory Visit: Admitting: Neurology
